# Patient Record
Sex: Female | Born: 1945 | Race: White | Hispanic: No | Smoking: Current every day smoker
Health system: Southern US, Community
[De-identification: ages and names within clinical notes are randomized; demographics above are authoritative.]

## PROBLEM LIST (undated history)

## (undated) DIAGNOSIS — J449 Chronic obstructive pulmonary disease, unspecified: Secondary | ICD-10-CM

## (undated) DIAGNOSIS — R5383 Other fatigue: Secondary | ICD-10-CM

## (undated) DIAGNOSIS — T7840XA Allergy, unspecified, initial encounter: Secondary | ICD-10-CM

## (undated) DIAGNOSIS — L509 Urticaria, unspecified: Secondary | ICD-10-CM

## (undated) DIAGNOSIS — E785 Hyperlipidemia, unspecified: Secondary | ICD-10-CM

## (undated) DIAGNOSIS — Z9889 Other specified postprocedural states: Secondary | ICD-10-CM

## (undated) DIAGNOSIS — R5381 Other malaise: Secondary | ICD-10-CM

## (undated) DIAGNOSIS — N3281 Overactive bladder: Secondary | ICD-10-CM

## (undated) DIAGNOSIS — J019 Acute sinusitis, unspecified: Secondary | ICD-10-CM

## (undated) DIAGNOSIS — C50919 Malignant neoplasm of unspecified site of unspecified female breast: Secondary | ICD-10-CM

## (undated) DIAGNOSIS — I10 Essential (primary) hypertension: Secondary | ICD-10-CM

## (undated) DIAGNOSIS — M810 Age-related osteoporosis without current pathological fracture: Secondary | ICD-10-CM

## (undated) DIAGNOSIS — H918X9 Other specified hearing loss, unspecified ear: Secondary | ICD-10-CM

## (undated) DIAGNOSIS — M545 Low back pain: Secondary | ICD-10-CM

## (undated) DIAGNOSIS — F329 Major depressive disorder, single episode, unspecified: Secondary | ICD-10-CM

## (undated) DIAGNOSIS — R112 Nausea with vomiting, unspecified: Secondary | ICD-10-CM

## (undated) HISTORY — DX: Urticaria, unspecified: L50.9

## (undated) HISTORY — DX: Other specified hearing loss, unspecified ear: H91.8X9

## (undated) HISTORY — DX: Low back pain: M54.5

## (undated) HISTORY — DX: Hyperlipidemia, unspecified: E78.5

## (undated) HISTORY — PX: OTHER SURGICAL HISTORY: SHX169

## (undated) HISTORY — DX: Essential (primary) hypertension: I10

## (undated) HISTORY — DX: Other malaise: R53.81

## (undated) HISTORY — DX: Chronic obstructive pulmonary disease, unspecified: J44.9

## (undated) HISTORY — DX: Malignant neoplasm of unspecified site of unspecified female breast: C50.919

## (undated) HISTORY — DX: Age-related osteoporosis without current pathological fracture: M81.0

## (undated) HISTORY — PX: OOPHORECTOMY: SHX86

## (undated) HISTORY — DX: Major depressive disorder, single episode, unspecified: F32.9

## (undated) HISTORY — DX: Overactive bladder: N32.81

## (undated) HISTORY — PX: ABDOMINAL HYSTERECTOMY: SHX81

## (undated) HISTORY — DX: Allergy, unspecified, initial encounter: T78.40XA

## (undated) HISTORY — DX: Acute sinusitis, unspecified: J01.90

## (undated) HISTORY — DX: Other fatigue: R53.83

---

## 1984-01-28 HISTORY — PX: BREAST BIOPSY: SHX20

## 1986-01-27 HISTORY — PX: APPENDECTOMY: SHX54

## 1998-01-27 HISTORY — PX: COLON SURGERY: SHX602

## 1998-01-30 ENCOUNTER — Emergency Department (HOSPITAL_COMMUNITY): Admission: EM | Admit: 1998-01-30 | Discharge: 1998-01-30 | Payer: Self-pay | Admitting: Emergency Medicine

## 1998-01-30 ENCOUNTER — Encounter: Payer: Self-pay | Admitting: Emergency Medicine

## 1998-02-03 ENCOUNTER — Encounter: Payer: Self-pay | Admitting: Emergency Medicine

## 1998-02-03 ENCOUNTER — Inpatient Hospital Stay (HOSPITAL_COMMUNITY): Admission: EM | Admit: 1998-02-03 | Discharge: 1998-02-18 | Payer: Self-pay | Admitting: Emergency Medicine

## 1998-02-04 ENCOUNTER — Encounter: Payer: Self-pay | Admitting: General Surgery

## 1998-02-05 ENCOUNTER — Encounter: Payer: Self-pay | Admitting: General Surgery

## 1998-02-06 ENCOUNTER — Encounter: Payer: Self-pay | Admitting: Family Medicine

## 1998-10-24 ENCOUNTER — Ambulatory Visit (HOSPITAL_COMMUNITY): Admission: RE | Admit: 1998-10-24 | Discharge: 1998-10-24 | Payer: Self-pay | Admitting: Family Medicine

## 1998-10-24 ENCOUNTER — Encounter: Payer: Self-pay | Admitting: Family Medicine

## 1999-09-26 ENCOUNTER — Encounter: Admission: RE | Admit: 1999-09-26 | Discharge: 1999-09-26 | Payer: Self-pay

## 2001-04-13 ENCOUNTER — Encounter: Admission: RE | Admit: 2001-04-13 | Discharge: 2001-04-13 | Payer: Self-pay

## 2006-03-30 ENCOUNTER — Ambulatory Visit: Payer: Self-pay | Admitting: Family Medicine

## 2006-09-28 LAB — HM COLONOSCOPY: HM Colonoscopy: NORMAL

## 2006-12-23 ENCOUNTER — Ambulatory Visit: Payer: Self-pay | Admitting: Internal Medicine

## 2006-12-23 ENCOUNTER — Encounter: Admission: RE | Admit: 2006-12-23 | Discharge: 2006-12-23 | Payer: Self-pay | Admitting: Internal Medicine

## 2006-12-23 DIAGNOSIS — F3289 Other specified depressive episodes: Secondary | ICD-10-CM

## 2006-12-23 DIAGNOSIS — M545 Low back pain, unspecified: Secondary | ICD-10-CM | POA: Insufficient documentation

## 2006-12-23 DIAGNOSIS — F329 Major depressive disorder, single episode, unspecified: Secondary | ICD-10-CM

## 2006-12-23 DIAGNOSIS — F32A Depression, unspecified: Secondary | ICD-10-CM | POA: Insufficient documentation

## 2006-12-23 HISTORY — DX: Low back pain, unspecified: M54.50

## 2006-12-23 HISTORY — DX: Other specified depressive episodes: F32.89

## 2006-12-23 HISTORY — DX: Major depressive disorder, single episode, unspecified: F32.9

## 2006-12-23 LAB — CONVERTED CEMR LAB
ALT: 15 units/L (ref 0–35)
AST: 19 units/L (ref 0–37)
Albumin: 4 g/dL (ref 3.5–5.2)
Alkaline Phosphatase: 68 units/L (ref 39–117)
BUN: 11 mg/dL (ref 6–23)
Basophils Absolute: 0 10*3/uL (ref 0.0–0.1)
Basophils Relative: 0 % (ref 0.0–1.0)
Bilirubin Urine: NEGATIVE
Bilirubin, Direct: 0.1 mg/dL (ref 0.0–0.3)
CO2: 29 meq/L (ref 19–32)
Calcium: 9.5 mg/dL (ref 8.4–10.5)
Chloride: 102 meq/L (ref 96–112)
Cholesterol: 180 mg/dL (ref 0–200)
Creatinine, Ser: 0.6 mg/dL (ref 0.4–1.2)
Eosinophils Absolute: 0.3 10*3/uL (ref 0.0–0.6)
Eosinophils Relative: 4.2 % (ref 0.0–5.0)
GFR calc Af Amer: 130 mL/min
GFR calc non Af Amer: 108 mL/min
Glucose, Bld: 106 mg/dL — ABNORMAL HIGH (ref 70–99)
HCT: 41.1 % (ref 36.0–46.0)
HDL: 54.6 mg/dL (ref >39.0)
Hemoglobin, Urine: NEGATIVE
Hemoglobin: 13.9 g/dL (ref 12.0–15.0)
Ketones, ur: NEGATIVE mg/dL
LDL Cholesterol: 110 mg/dL — ABNORMAL HIGH (ref 0–99)
Leukocytes, UA: NEGATIVE
Lymphocytes Relative: 16 % (ref 12.0–46.0)
MCHC: 33.9 g/dL (ref 30.0–36.0)
MCV: 91.8 fL (ref 78.0–100.0)
Monocytes Absolute: 0.5 10*3/uL (ref 0.2–0.7)
Monocytes Relative: 6.2 % (ref 3.0–11.0)
Neutro Abs: 5.5 10*3/uL (ref 1.4–7.7)
Neutrophils Relative %: 73.6 % (ref 43.0–77.0)
Nitrite: NEGATIVE
Platelets: 363 10*3/uL (ref 150–400)
Potassium: 4.5 meq/L (ref 3.5–5.1)
RBC: 4.47 M/uL (ref 3.87–5.11)
RDW: 12.4 % (ref 11.5–14.6)
Sodium: 138 meq/L (ref 135–145)
Specific Gravity, Urine: 1.01 (ref 1.000–1.03)
TSH: 1.36 microintl units/mL (ref 0.35–5.50)
Total Bilirubin: 0.5 mg/dL (ref 0.3–1.2)
Total CHOL/HDL Ratio: 3.3
Total Protein, Urine: NEGATIVE mg/dL
Total Protein: 7 g/dL (ref 6.0–8.3)
Triglycerides: 78 mg/dL (ref 0–149)
Urine Glucose: NEGATIVE mg/dL
Urobilinogen, UA: 0.2 (ref 0.0–1.0)
VLDL: 16 mg/dL (ref 0–40)
WBC: 7.5 10*3/uL (ref 4.5–10.5)
pH: 6 (ref 5.0–8.0)

## 2006-12-23 LAB — HM MAMMOGRAPHY: HM Mammogram: NORMAL

## 2007-01-04 ENCOUNTER — Encounter: Payer: Self-pay | Admitting: Internal Medicine

## 2007-07-09 ENCOUNTER — Ambulatory Visit: Payer: Self-pay | Admitting: Internal Medicine

## 2007-07-09 DIAGNOSIS — J019 Acute sinusitis, unspecified: Secondary | ICD-10-CM

## 2007-07-09 HISTORY — DX: Acute sinusitis, unspecified: J01.90

## 2008-04-30 ENCOUNTER — Emergency Department (HOSPITAL_COMMUNITY): Admission: EM | Admit: 2008-04-30 | Discharge: 2008-04-30 | Payer: Self-pay | Admitting: Family Medicine

## 2008-11-06 ENCOUNTER — Encounter: Payer: Self-pay | Admitting: Internal Medicine

## 2010-01-08 ENCOUNTER — Encounter: Payer: Self-pay | Admitting: Internal Medicine

## 2010-01-08 ENCOUNTER — Ambulatory Visit: Payer: Self-pay | Admitting: Internal Medicine

## 2010-01-08 DIAGNOSIS — H918X9 Other specified hearing loss, unspecified ear: Secondary | ICD-10-CM | POA: Insufficient documentation

## 2010-01-08 DIAGNOSIS — J449 Chronic obstructive pulmonary disease, unspecified: Secondary | ICD-10-CM | POA: Insufficient documentation

## 2010-01-08 DIAGNOSIS — M810 Age-related osteoporosis without current pathological fracture: Secondary | ICD-10-CM

## 2010-01-08 DIAGNOSIS — E785 Hyperlipidemia, unspecified: Secondary | ICD-10-CM

## 2010-01-08 DIAGNOSIS — R5383 Other fatigue: Secondary | ICD-10-CM | POA: Insufficient documentation

## 2010-01-08 DIAGNOSIS — J4489 Other specified chronic obstructive pulmonary disease: Secondary | ICD-10-CM

## 2010-01-08 DIAGNOSIS — R5381 Other malaise: Secondary | ICD-10-CM

## 2010-01-08 HISTORY — DX: Other malaise: R53.81

## 2010-01-08 HISTORY — DX: Age-related osteoporosis without current pathological fracture: M81.0

## 2010-01-08 HISTORY — DX: Chronic obstructive pulmonary disease, unspecified: J44.9

## 2010-01-08 HISTORY — DX: Other specified chronic obstructive pulmonary disease: J44.89

## 2010-01-08 HISTORY — DX: Other specified hearing loss, unspecified ear: H91.8X9

## 2010-01-08 HISTORY — DX: Hyperlipidemia, unspecified: E78.5

## 2010-01-09 ENCOUNTER — Encounter: Payer: Self-pay | Admitting: Internal Medicine

## 2010-01-09 ENCOUNTER — Telehealth: Payer: Self-pay | Admitting: Internal Medicine

## 2010-01-09 LAB — CONVERTED CEMR LAB
ALT: 24 units/L (ref 0–35)
AST: 25 units/L (ref 0–37)
Albumin: 4 g/dL (ref 3.5–5.2)
Alkaline Phosphatase: 73 units/L (ref 39–117)
BUN: 12 mg/dL (ref 6–23)
Basophils Absolute: 0 10*3/uL (ref 0.0–0.1)
Basophils Relative: 0.4 % (ref 0.0–3.0)
Bilirubin, Direct: 0.1 mg/dL (ref 0.0–0.3)
CO2: 24 meq/L (ref 19–32)
Calcium, Total (PTH): 9.2 mg/dL (ref 8.4–10.5)
Calcium: 9.5 mg/dL (ref 8.4–10.5)
Chloride: 109 meq/L (ref 96–112)
Cholesterol: 193 mg/dL (ref 0–200)
Creatinine, Ser: 0.6 mg/dL (ref 0.4–1.2)
Eosinophils Absolute: 0.2 10*3/uL (ref 0.0–0.7)
Eosinophils Relative: 2.6 % (ref 0.0–5.0)
GFR calc non Af Amer: 98.68 mL/min (ref >60.00)
Glucose, Bld: 106 mg/dL — ABNORMAL HIGH (ref 70–99)
HCT: 39 % (ref 36.0–46.0)
HDL: 45.9 mg/dL (ref >39.00)
Hemoglobin: 13.1 g/dL (ref 12.0–15.0)
LDL Cholesterol: 123 mg/dL — ABNORMAL HIGH (ref 0–99)
Lymphocytes Relative: 27.2 % (ref 12.0–46.0)
Lymphs Abs: 1.9 10*3/uL (ref 0.7–4.0)
MCHC: 33.6 g/dL (ref 30.0–36.0)
MCV: 91.2 fL (ref 78.0–100.0)
Monocytes Absolute: 0.6 10*3/uL (ref 0.1–1.0)
Monocytes Relative: 8.6 % (ref 3.0–12.0)
Neutro Abs: 4.3 10*3/uL (ref 1.4–7.7)
Neutrophils Relative %: 61.2 % (ref 43.0–77.0)
PTH: 74.8 pg/mL — ABNORMAL HIGH (ref 14.0–72.0)
Platelets: 408 10*3/uL — ABNORMAL HIGH (ref 150.0–400.0)
Potassium: 4.1 meq/L (ref 3.5–5.1)
RBC: 4.28 M/uL (ref 3.87–5.11)
RDW: 13.4 % (ref 11.5–14.6)
Sodium: 142 meq/L (ref 135–145)
TSH: 1.22 microintl units/mL (ref 0.35–5.50)
Total Bilirubin: 0.3 mg/dL (ref 0.3–1.2)
Total CHOL/HDL Ratio: 4
Total Protein: 7.1 g/dL (ref 6.0–8.3)
Triglycerides: 120 mg/dL (ref 0.0–149.0)
VLDL: 24 mg/dL (ref 0.0–40.0)
Vit D, 25-Hydroxy: 28 ng/mL — ABNORMAL LOW (ref 30–89)
WBC: 7 10*3/uL (ref 4.5–10.5)

## 2010-02-26 NOTE — Therapy (Signed)
Summary: [facility]  [facility]   Imported By: Sherian Rein 11/15/2008 09:18:10  _____________________________________________________________________  External Attachment:    Type:   Image     Comment:   External Document

## 2010-02-26 NOTE — Assessment & Plan Note (Signed)
Summary: SINUS PROBLEM-$50-STC   Vital Signs:  Patient Profile:   65 Years Old Female Weight:      135 pounds Temp:     97.8 degrees F oral Pulse rate:   89 / minute BP sitting:   126 / 63  (right arm) Cuff size:   regular  Pt. in pain?   no  Vitals Entered By: Maris Berger (July 09, 2007 2:01 PM)                  Chief Complaint:  sore throat.  History of Present Illness: here with 2 wks ongoing right facial pain, fever, greenish congestion and right earache since she got back from [employer]    Updated Prior Medication List: MOBIC 7.5 MG  TABS (MELOXICAM) as needed WELLBUTRIN SR 150 MG  TB12 (BUPROPION HCL) 1 by mouth bid ALPRAZOLAM 0.25 MG  TABS (ALPRAZOLAM) 1 by mouth at bedtime prn ALENDRONATE SODIUM 70 MG  TABS (ALENDRONATE SODIUM) 1 by mouth qwk LEVAQUIN 500 MG  TABS (LEVOFLOXACIN) 1po once daily TESSALON 200 MG  CAPS (BENZONATATE) 1 by mouth q 6 hrs as needed cough  Current Allergies (reviewed today): ! CODEINE  Past Medical History:    Reviewed history from 12/23/2006 and no changes required:       Low back pain       lumbar djd       c-spine djd       SBO 2000 secondary to scar tissue  Past Surgical History:    Reviewed history from 12/23/2006 and no changes required:       breast biopsy - 1986       Oophorectomy secondary to cyst - bilat 1988       Hysterectomy   Social History:    Reviewed history from 12/23/2006 and no changes required:       Current Smoker       Alcohol use-yes    Review of Systems       all otherwise negative    Physical Exam  General:     mild ill, well developed  Head:     Normocephalic and atraumatic without obvious abnormalities. No apparent alopecia or balding. Eyes:     No corneal or conjunctival inflammation noted. EOMI. Perrla Ears:     right tm severe red, left mild red Nose:     nasal dischargemucosal pallor and mucosal erythema.   Mouth:     pharyngeal erythema and fair dentition.   Neck:      supple and cervical lymphadenopathy.   Lungs:     Normal respiratory effort, chest expands symmetrically. Lungs are clear to auscultation, no crackles or wheezes. Heart:     Normal rate and regular rhythm. S1 and S2 normal without gallop, murmur, click, rub or other extra sounds. Extremities:     no edema, no ulcers     Impression & Recommendations:  Problem # 1:  SINUSITIS- ACUTE-NOS (ICD-461.9)  Her updated medication list for this problem includes:    Levaquin 500 Mg Tabs (Levofloxacin) .Marland Kitchen... 1po once daily    Tessalon 200 Mg Caps (Benzonatate) .Marland Kitchen... 1 by mouth q 6 hrs as needed cough treat as above, f/u any worsening signs or symptoms   Complete Medication List: 1)  Mobic 7.5 Mg Tabs (Meloxicam) .... As needed 2)  Wellbutrin Sr 150 Mg Tb12 (Bupropion hcl) .Marland Kitchen.. 1 by mouth bid 3)  Alprazolam 0.25 Mg Tabs (Alprazolam) .Marland Kitchen.. 1 by mouth at bedtime prn 4)  Alendronate Sodium 70 Mg Tabs (Alendronate sodium) .Marland Kitchen.. 1 by mouth qwk 5)  Levaquin 500 Mg Tabs (Levofloxacin) .Marland Kitchen.. 1po once daily 6)  Tessalon 200 Mg Caps (Benzonatate) .Marland Kitchen.. 1 by mouth q 6 hrs as needed cough   Patient Instructions: 1)  take all new medications as prescribed  2)  continue all medications that you may have been taking previously 3)  Please schedule a follow-up appointment in 6 months with CPX labs   Prescriptions: TESSALON 200 MG  CAPS (BENZONATATE) 1 by mouth q 6 hrs as needed cough  #60 x 1   Entered and Authorized by:   Corwin Levins MD   Signed by:   Corwin Levins MD on 07/09/2007   Method used:   Print then Give to Patient   RxID:   [id number] LEVAQUIN 500 MG  TABS (LEVOFLOXACIN) 1po once daily  #10 x 0   Entered and Authorized by:   Corwin Levins MD   Signed by:   Corwin Levins MD on 07/09/2007   Method used:   Print then Give to Patient   RxID:   [id number] ALPRAZOLAM 0.25 MG  TABS (ALPRAZOLAM) 1 by mouth at bedtime prn  #30 x 5   Entered and Authorized by:   Corwin Levins MD   Signed  by:   Corwin Levins MD on 07/09/2007   Method used:   Print then Give to Patient   RxID:   [id number]  ]

## 2010-02-26 NOTE — Consult Note (Signed)
Summary: [facility]Audiological re-evaluation  [facility]Audiological re-evaluation   Imported By: Esmeralda Links D'jimraou 01/15/2007 11:56:49  _____________________________________________________________________  External Attachment:    Type:   Image     Comment:   External Document

## 2010-02-28 NOTE — Assessment & Plan Note (Signed)
Summary: SINUS PROBLEM---STC   Vital Signs:  Patient profile:   65 year old female Height:      65.5 inches Weight:      156.38 pounds BMI:     25.72 O2 Sat:      95 % on Room air Temp:     97.7 degrees F oral Pulse rate:   81 / minute BP sitting:   138 / 82  (left arm) Cuff size:   regular  Vitals Entered By: Zella Ball Ewing CMA Duncan Dull) (January 08, 2010 3:48 PM)  O2 Flow:  Room air  Preventive Care Screening  Last Flu Shot:    Date:  01/08/2010    Results:  Fluvax 3+  Bone Density:    Date:  12/23/2006    Results:  abnormal std dev  CC: Headache, ear pain, refills, flu shot/RE   CC:  Headache, ear pain, refills, and flu shot/RE.  History of Present Illness: here after a hiatus of 2 yrs as she did not have health insurance;  lives in [place] but lose to [place]; was seen per [facility] pulmonary with PNA 2 yrs ago, PFT's with mild COPD and did well thereafter;  here with c/o 2-3 days facial pain, pressure, fever and grenish d/c with slight ST and nonprod cough but Pt denies CP, worsening sob, doe, wheezing, orthopnea, pnd, worsening LE edema, palps, dizziness or syncope .  Pt denies new neuro symptoms such as headache, facial or extremity weakness  Pt denies polydipsia, polyuria.   Overall good compliance with meds, trying to follow low chol diet, wt stable, little excercise however  No recent wt loss, night sweats, loss of appetite or other constitutional symptoms Denies suicidal ideation, or panic, but has mild to mod seasonal depressive symptoms with low mood, tearfulness, anhedonia , this being the third xmas after her husband died (did ok the last 2 yrs).  Had quit smoking for 5 mo, then just restarted 2 days ago due to the symptoms and anxiety.  Asks for repeat wellbutrin as this helped before, and she read where it can help with smoking cessation as well.   Also states she stopped her alendronate as she read about atypical fx risk, though she has taken < 5 yrs.    Preventive  Screening-Counseling & Management      Drug Use:  no.    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 As Needed 2)  Wellbutrin Sr 150 Mg  Tb12 (Bupropion Hcl) .Marland Kitchen.. 1 By Mouth Bid 3)  Alprazolam 0.25 Mg  Tabs (Alprazolam) .Marland Kitchen.. 1 By Mouth At Bedtime Prn 4)  Alendronate Sodium 70 Mg  Tabs (Alendronate Sodium) .Marland Kitchen.. 1 By Mouth Qwk 5)  Levaquin 500 Mg  Tabs (Levofloxacin) .Marland Kitchen.. 1po Once Daily 6)  Tessalon 200 Mg  Caps (Benzonatate) .Marland Kitchen.. 1 By Mouth Q 6 Hrs As Needed Cough  Current Medications (verified): 1)  Mobic 7.5 Mg  Tabs (Meloxicam) .Marland Kitchen.. 1 By Mouth Once Daily As Needed 2)  Wellbutrin Sr 150 Mg  Tb12 (  Bupropion Hcl) .Marland Kitchen.. 1 By Mouth Two Times A Day 3)  Alprazolam 0.25 Mg  Tabs (Alprazolam) .Marland Kitchen.. 1 By Mouth At Bedtime As Needed 4)  Doxycycline Hyclate 100 Mg Caps (Doxycycline Hyclate) .Marland Kitchen.. 1 By Mouth Two Times A Day  Allergies (verified): 1)  ! Codeine  Past History:  Past Surgical History: Last updated: 12/23/2006 breast biopsy - 1986 Oophorectomy secondary to cyst - bilat 1988 Hysterectomy  Family History: Last updated: 12/23/2006 mother and brother with colon cancer grandmother with dm uncle with dm  Social History: Last updated: 01/08/2010 Current Smoker Alcohol use-yes Drug use-no Retired Widow/Widower  Risk Factors: Smoking Status: current (12/23/2006)  Past Medical History: Low back pain lumbar djd c-spine djd SBO 2000 secondary to scar tissue COPD Osteoporosis  Social History: Reviewed history  from 12/23/2006 and no changes required. Current Smoker Alcohol use-yes Drug use-no Retired Conservation officer, [employer] Drug Use:  no  Review of Systems       all otherwise negative per pt -  except for decrsaed hearing right ear with wax impaction, and also fatigue without OSA symptoms.  Physical Exam  General:  alert and overweight-appearing., mild ill  Head:  normocephalic and atraumatic.   Eyes:  vision grossly intact, pupils equal, and pupils round.   Ears:  bilat tm's mild red, sinus tender bialt max area;  right TM with wax impaction and decreased hearing resolved with irrigation Nose:  nasal dischargemucosal pallor and mucosal edema.   Mouth:  pharyngeal erythema.   Neck:  supple and no masses.   Lungs:  normal respiratory effort, R decreased breath sounds, and L decreased breath sounds.  , no wheezing Heart:  normal rate and regular rhythm.   Abdomen:  soft, non-tender, and normal bowel sounds.   Msk:  no joint tenderness and no joint warmth.   Extremities:  no edema, no erythema  Neurologic:  strength normal in all extremities and gait normal.   Psych:  dysphoric affect, tearful, and moderately anxious.     Impression & Recommendations:  Problem # 1:  OSTEOPOROSIS (ICD-733.00)  The following medications were removed from the medication list:    Alendronate Sodium 70 Mg Tabs (Alendronate sodium) .Marland Kitchen... 1 by mouth qwk due for f/u dxa, vit d, PTH level , last dxa reviewed with pt  Orders: T-Vitamin D (25-Hydroxy) [phone]) T-Bone Densitometry [phone]) T-Parathyroid Hormone, Intact [phone])  Problem # 2:  OTHER SPECIFIED FORMS OF HEARING LOSS (ICD-389.8) improved on the left after irrigation for wax impaction  Problem # 3:  DEPRESSIVE DISORDER (ICD-311)  Her updated medication list for this problem includes:    Wellbutrin Sr 150 Mg Tb12 (Bupropion hcl) .Marland Kitchen... 1 by mouth two times a day    Alprazolam 0.25 Mg Tabs (Alprazolam) .Marland Kitchen... 1 by mouth at bedtime as needed also  for quitting smoking;  treat as above, f/u any worsening signs or symptoms   Problem # 4:  FATIGUE (ICD-780.79) exam benign, to check labs below; follow with expectant management  Orders: TLB-BMP (Basic Metabolic Panel-BMET) (80048-METABOL) TLB-CBC Platelet - w/Differential (85025-CBCD) TLB-Hepatic/Liver Function Pnl (80076-HEPATIC) TLB-TSH (Thyroid Stimulating Hormone) (84443-TSH)  Problem # 5:  SINUSITIS- ACUTE-NOS (ICD-461.9)  The following medications were removed from the medication list:    Tessalon 200 Mg Caps (Benzonatate) .Marland Kitchen... 1 by mouth q 6 hrs as needed cough Her updated medication list for this problem includes:    Doxycycline Hyclate 100 Mg Caps (Doxycycline hyclate) .Marland Kitchen... 1 by mouth two times a day treat as above, f/u any worsening signs  or symptoms   Complete Medication List: 1)  Mobic 7.5 Mg Tabs (Meloxicam) .Marland Kitchen.. 1 by mouth once daily as needed 2)  Wellbutrin Sr 150 Mg Tb12 (Bupropion hcl) .Marland Kitchen.. 1 by mouth two times a day 3)  Alprazolam 0.25 Mg Tabs (Alprazolam) .Marland Kitchen.. 1 by mouth at bedtime as needed 4)  Doxycycline Hyclate 100 Mg Caps (Doxycycline hyclate) .Marland Kitchen.. 1 by mouth two times a day  Other Orders: Flu Vaccine 79yrs + MEDICARE PATIENTS (Z6109) Administration Flu vaccine - MCR (G0008) TLB-Lipid Panel (80061-LIPID) Pneumococcal Vaccine (60454) Admin 1st Vaccine (09811)  Patient Instructions: 1)  you had the flu shot today, and the pneumonia shot today 2)  you had the ears irrigated today 3)  The  nurse will call about the copay for the prolia (stacy) 4)  Please take all new medications as prescribed - the antibiotic, wellbutrin, and xanax as needed  5)  Please go to the Lab in the basement for your blood and/or urine tests today 6)  Please call the number on the [facility] Card for results of your testing  7)  Please schedule your bone density before leaving today 8)  Please schedule a follow-up appointment as you suggested Prescriptions: MOBIC 7.5 MG  TABS  (MELOXICAM) 1 by mouth once daily as needed  #90 x 3   Entered and Authorized by:   Corwin Levins MD   Signed by:   Corwin Levins MD on 01/08/2010   Method used:   Print then Give to Patient   RxID:   [id number] DOXYCYCLINE HYCLATE 100 MG CAPS (DOXYCYCLINE HYCLATE) 1 by mouth two times a day  #20 x 0   Entered and Authorized by:   Corwin Levins MD   Signed by:   Corwin Levins MD on 01/08/2010   Method used:   Print then Give to Patient   RxID:   [id number] ALPRAZOLAM 0.25 MG  TABS (ALPRAZOLAM) 1 by mouth at bedtime as needed  #30 x 5   Entered and Authorized by:   Corwin Levins MD   Signed by:   Corwin Levins MD on 01/08/2010   Method used:   Print then Give to Patient   RxID:   [id number] WELLBUTRIN SR 150 MG  TB12 (BUPROPION HCL) 1 by mouth two times a day  #60 x 11   Entered and Authorized by:   Corwin Levins MD   Signed by:   Corwin Levins MD on 01/08/2010   Method used:   Print then Give to Patient   RxID:   [id number]    Orders Added: 1)  Flu Vaccine 80yrs + MEDICARE PATIENTS [Q2039] 2)  Administration Flu vaccine - MCR [G0008] 3)  TLB-BMP (Basic Metabolic Panel-BMET) [80048-METABOL] 4)  TLB-CBC Platelet - w/Differential [85025-CBCD] 5)  TLB-Hepatic/Liver Function Pnl [80076-HEPATIC] 6)  TLB-TSH (Thyroid Stimulating Hormone) [84443-TSH] 7)  T-Vitamin D (25-Hydroxy) [95188-41660] 8)  T-Bone Densitometry [77080] 9)  TLB-Lipid Panel [80061-LIPID] 10)  T-Parathyroid Hormone, Intact [63016-01093] 11)  Pneumococcal Vaccine [90732] 12)  Admin 1st Vaccine [90471] 13)  Est. Patient Level IV [23557]   Immunizations Administered:  Influenza Vaccine # 1:    Vaccine Type: Fluvax 3+    Site: left deltoid    Mfr: Sanofi Pasteur    Dose: 0.5 ml    Route: IM    Given by: Zella Ball Ewing CMA (AAMA)    Exp. Date: 08/26/2010    Lot #: [id number]    VIS given: 08/21/09 version given  January 08, 2010.  Pneumonia Vaccine:    Vaccine Type: Pneumovax    Site: right  deltoid    Mfr: Merck    Dose: 0.5 ml    Route: IM    Given by: Zella Ball Ewing CMA (AAMA)    Exp. Date: 05/24/2011    Lot #: [id number]    VIS given: 01/01/09 version given January 08, 2010.  Flu Vaccine Consent Questions:    Do you have a history of severe allergic reactions to this vaccine? no    Any prior history of allergic reactions to egg and/or gelatin? no    Do you have a sensitivity to the preservative Thimersol? no    Do you have a past history of Guillan-Barre Syndrome? no    Do you currently have an acute febrile illness? no    Have you ever had a severe reaction to latex? no    Vaccine information given and explained to patient? yes    Are you currently pregnant? no   Immunizations Administered:  Influenza Vaccine # 1:    Vaccine Type: Fluvax 3+    Site: left deltoid    Mfr: Sanofi Pasteur    Dose: 0.5 ml    Route: IM    Given by: Zella Ball Ewing CMA (AAMA)    Exp. Date: 08/26/2010    Lot #: [id number]    VIS given: 08/21/09 version given January 08, 2010.  Pneumonia Vaccine:    Vaccine Type: Pneumovax    Site: right deltoid    Mfr: Merck    Dose: 0.5 ml    Route: IM    Given by: Zella Ball Ewing CMA (AAMA)    Exp. Date: 05/24/2011    Lot #: [id number]    VIS given: 01/01/09 version given January 08, 2010.

## 2010-02-28 NOTE — Progress Notes (Signed)
Summary: [employer]  Phone Note Outgoing Call   Call placed by: Lanier Prude, [facility]),  January 09, 2010 9:58 AM Summary of Call: faxed ins. verification request to [employer]........Marland Kitchenwill wait on benefits summary.  Follow-up for Phone Call        rec benefits summary from [employer]. [employer] will cost $0 OOP. left mess for pt to call back to inform of above Follow-up by: Lanier Prude, [facility]),  January 17, 2010 9:20 AM  Additional Follow-up for Phone Call Additional follow up Details #1::        pt returned my call and states she has changed her secondary ins recently. I informed her that the benefits may or may not be the same and that we need a copy of her new ins card to reverify benefits for [employer]. Pt understands and will bring new card sometime this week. Additional Follow-up by: Lanier Prude, [facility]),  February 04, 2010 3:11 PM    Additional Follow-up for Phone Call Additional follow up Details #2::    noted Follow-up by: Corwin Levins MD,  February 04, 2010 3:16 PM

## 2010-02-28 NOTE — Medication Information (Signed)
Summary: Ins Verification for [employer]  Ins Verification for [employer]   Imported By: Sherian Rein 01/14/2010 08:52:21  _____________________________________________________________________  External Attachment:    Type:   Image     Comment:   External Document

## 2010-03-05 ENCOUNTER — Encounter: Payer: Self-pay | Admitting: Internal Medicine

## 2010-03-06 ENCOUNTER — Other Ambulatory Visit: Payer: Self-pay

## 2010-03-06 ENCOUNTER — Other Ambulatory Visit: Payer: Self-pay | Admitting: Internal Medicine

## 2010-03-06 ENCOUNTER — Ambulatory Visit (INDEPENDENT_AMBULATORY_CARE_PROVIDER_SITE_OTHER)
Admission: RE | Admit: 2010-03-06 | Discharge: 2010-03-06 | Disposition: A | Discharge status: Home / Self Care | Payer: Medicare Other | Source: Ambulatory Visit | Attending: Internal Medicine | Admitting: Internal Medicine

## 2010-03-06 ENCOUNTER — Encounter: Payer: Self-pay | Admitting: Internal Medicine

## 2010-03-06 DIAGNOSIS — M810 Age-related osteoporosis without current pathological fracture: Secondary | ICD-10-CM

## 2010-03-06 LAB — HM DEXA SCAN

## 2010-03-14 NOTE — Miscellaneous (Signed)
Summary: Orders Update   Clinical Lists Changes  Orders: Added new Test order of T-Lumbar Vertebral Assessment (77082) - Signed 

## 2010-04-03 ENCOUNTER — Encounter: Payer: Self-pay | Admitting: Internal Medicine

## 2010-04-04 ENCOUNTER — Telehealth: Payer: Self-pay | Admitting: Internal Medicine

## 2010-04-09 NOTE — Medication Information (Signed)
Summary: RX Folder  RX Folder   Imported By: Kassie Mends 04/05/2010 09:33:40  _____________________________________________________________________  External Attachment:    Type:   Image     Comment:   External Document

## 2010-04-09 NOTE — Progress Notes (Signed)
Summary: [employer]  Phone Note Outgoing Call   Call placed by: Lanier Prude, [facility]),  April 04, 2010 2:00 PM Summary of Call: I rec a new updated benefit summary from [employer] stating pt's OOP is approx $121. See new/updated benefit summary dated 04-04-10. left mess for pt to call back to advise pt of above. Initial call taken by: Lanier Prude, [facility]),  April 04, 2010 2:06 PM  Follow-up for Phone Call        Informed Pt. and transferred to scheduler to make Appt. Informed Pearla Dubonnet to place Prolia order. Follow-up by: Burnard Leigh [facility]),  April 05, 2010 4:15 PM

## 2010-04-16 ENCOUNTER — Ambulatory Visit (INDEPENDENT_AMBULATORY_CARE_PROVIDER_SITE_OTHER): Payer: Medicare Other | Admitting: Internal Medicine

## 2010-04-16 DIAGNOSIS — M810 Age-related osteoporosis without current pathological fracture: Secondary | ICD-10-CM

## 2010-04-17 MED ORDER — DENOSUMAB 60 MG/ML ~~LOC~~ SOLN: 60.0000 mg | Freq: Once | SUBCUTANEOUS | Status: DC

## 2010-04-18 MED ORDER — DENOSUMAB 60 MG/ML ~~LOC~~ SOLN
60.0000 mg | Freq: Once | SUBCUTANEOUS | Status: AC
  Administered 2010-04-16: 60 mg via SUBCUTANEOUS

## 2010-04-30 ENCOUNTER — Encounter: Payer: Self-pay | Admitting: Internal Medicine

## 2010-04-30 ENCOUNTER — Ambulatory Visit (INDEPENDENT_AMBULATORY_CARE_PROVIDER_SITE_OTHER): Payer: Medicare Other | Admitting: Internal Medicine

## 2010-04-30 DIAGNOSIS — E785 Hyperlipidemia, unspecified: Secondary | ICD-10-CM

## 2010-04-30 DIAGNOSIS — E559 Vitamin D deficiency, unspecified: Secondary | ICD-10-CM

## 2010-04-30 DIAGNOSIS — F329 Major depressive disorder, single episode, unspecified: Secondary | ICD-10-CM

## 2010-04-30 DIAGNOSIS — J449 Chronic obstructive pulmonary disease, unspecified: Secondary | ICD-10-CM

## 2010-04-30 NOTE — Patient Instructions (Addendum)
Please start oscal plus D twice per day Please follow lower cholesterol diet Continue all other medications as before Please return in December 2012  (remember no fasting needed, but labs will need to be done then) Please call if you need further refills

## 2010-04-30 NOTE — Assessment & Plan Note (Signed)
stable overall by hx and exam, most recent lab reviewed with pt, and pt to continue medical treatment as before 

## 2010-04-30 NOTE — Progress Notes (Signed)
Subjective:    Patient ID: Michelle Hubbard, female    DOB: 21-Jan-1946, 65 y.o.   MRN: [id number]  HPI  Here to f/u; overall doing very well, no specific worsening complaints today;   Pt denies CP, worsening SOB, DOE, wheezing, orthopnea, PND, worsening LE edema, palpitations, dizziness or syncope.  Pt denies neurological change such as new Headache, facial or extremity weakness.  Pt denies polydipsia, polyuria, or low sugar symptoms. Pt states overall good compliance with treatment and medications, good tolerability, but not really trying to follow lower cholesterol diet.  Pt denies worsening depressive symptoms, suicidal ideation or panic. No fever, wt loss, night sweats, loss of appetite, or other constitutional symptoms.  Was able to quit smoking with the wellbutrin and anxiety levels stable though picked up several lbs in the past few months despite walking the dog 1 mile per day.    Past Medical History  Diagnosis Date  . DEPRESSIVE DISORDER 12/23/2006  . SINUSITIS- ACUTE-NOS 07/09/2007  . LOW BACK PAIN 12/23/2006  . HYPERLIPIDEMIA 01/08/2010  . 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 COPD 01/08/2010  . OSTEOPOROSIS 01/08/2010  . FATIGUE 01/08/2010  . Vitamin D deficiency 04/30/2010   Past Surgical History  Procedure Date  . Breast biopsy 1986  . Oophorectomy bilat 1988    secondary to cyst  . Abdominal hysterectomy     reports that she has been smoking.  She does not have any smokeless tobacco history on file. She reports that she drinks alcohol. She reports that she does not use illicit drugs. family history includes Colon cancer in her brother and mother and Diabetes in her maternal aunt and maternal grandmother. Allergies  Allergen Reactions  . Codeine     REACTION: nausea    Current Outpatient Prescriptions on File Prior to Visit  Medication Sig Dispense Refill  . ALPRAZolam (XANAX) 0.25 MG tablet Take 0.25 mg by mouth at bedtime as needed.        Marland Kitchen buPROPion  (WELLBUTRIN SR) 150 MG 12 hr tablet Take 150 mg by mouth 2 (two) times daily.        . meloxicam (MOBIC) 7.5 MG tablet Take 7.5 mg by mouth daily.        Marland Kitchen DISCONTD: doxycycline (VIBRAMYCIN) 100 MG capsule Take 100 mg by mouth 2 (two) times daily.         Review of Systems Review of Systems  Constitutional: Negative for diaphoresis and unexpected weight change.  HENT: Negative for drooling and tinnitus.   Eyes: Negative for photophobia and visual disturbance.  Respiratory: Negative for choking and stridor.   Gastrointestinal: Negative for vomiting and blood in stool.  Genitourinary: Negative for hematuria and decreased urine volume.  Musculoskeletal: Negative for gait problem.  Skin: Negative for color change and wound.  Neurological: Negative for tremors and numbness.  Psychiatric/Behavioral: Negative for decreased concentration. The patient is not hyperactive.       Objective:   Physical Exam Physical Exam  VS noted Constitutional: Pt appears well-developed and well-nourished.  HENT: Head: Normocephalic.  Right Ear: External ear normal.  Left Ear: External ear normal.  Eyes: Conjunctivae and EOM are normal. Pupils are equal, round, and reactive to light.  Neck: Normal range of motion. Neck supple.  Cardiovascular: Normal rate and regular rhythm.   Pulmonary/Chest: Effort normal and breath sounds normal.  Abd:  Soft, NT, non-distended, + BS Neurological: Pt is alert. No cranial nerve deficit.  Skin: Skin is warm. No erythema.  Psychiatric: Pt behavior is normal. Thought content normal.         Assessment & Plan:

## 2010-04-30 NOTE — Assessment & Plan Note (Signed)
D/w pt and her most recent dxa - to cont the prolia, but to start the oscal plus D bid

## 2010-04-30 NOTE — Assessment & Plan Note (Signed)
stable overall by hx and exam, most recent lab reviewed with pt, and pt to continue medical treatment as before  - to cont diet per pt for now, to consider statin in the future if ldl still > 100  Lab Results  Component Value Date   LDLCALC 123* 01/08/2010

## 2010-04-30 NOTE — Assessment & Plan Note (Signed)
stable overall by hx and exam, most recent lab reviewed with pt, and pt to continue medical treatment as before  SpO2 Readings from Last 3 Encounters:  04/30/10 96%  01/08/10 95%

## 2010-07-23 ENCOUNTER — Other Ambulatory Visit: Payer: Self-pay

## 2010-07-23 MED ORDER — ALPRAZOLAM 0.25 MG PO TABS: 0.2500 mg | ORAL_TABLET | Freq: Every evening | ORAL | Status: DC | PRN

## 2010-07-23 NOTE — Telephone Encounter (Signed)
Faxed hardcopy to pharmacy. 

## 2010-10-03 ENCOUNTER — Telehealth: Payer: Self-pay | Admitting: *Deleted

## 2010-10-03 NOTE — Telephone Encounter (Signed)
rec pt's eligibility check results from [employer] for her upcoming injection due on 10-17-10. Her OOP is $0 plus any applicable deductible. I called pt to advise of this. No answer. Left mess for patient to call back.

## 2010-10-11 NOTE — Telephone Encounter (Signed)
Pt informed and transferred to scheduler to make nurse visit for Thursday 10-17-10 of next week or later.

## 2010-10-17 ENCOUNTER — Ambulatory Visit (INDEPENDENT_AMBULATORY_CARE_PROVIDER_SITE_OTHER): Payer: Medicare Other

## 2010-10-17 DIAGNOSIS — M810 Age-related osteoporosis without current pathological fracture: Secondary | ICD-10-CM

## 2010-10-17 MED ORDER — DENOSUMAB 60 MG/ML ~~LOC~~ SOLN
60.0000 mg | Freq: Once | SUBCUTANEOUS | Status: AC
  Administered 2010-10-17: 60 mg via SUBCUTANEOUS

## 2010-12-31 ENCOUNTER — Ambulatory Visit: Payer: Medicare Other | Admitting: Internal Medicine

## 2011-06-03 ENCOUNTER — Ambulatory Visit (INDEPENDENT_AMBULATORY_CARE_PROVIDER_SITE_OTHER): Payer: Medicare Other

## 2011-06-03 DIAGNOSIS — M810 Age-related osteoporosis without current pathological fracture: Secondary | ICD-10-CM

## 2011-06-03 MED ORDER — DENOSUMAB 60 MG/ML ~~LOC~~ SOLN
60.0000 mg | Freq: Once | SUBCUTANEOUS | Status: AC
  Administered 2011-06-03: 60 mg via SUBCUTANEOUS

## 2011-08-15 ENCOUNTER — Other Ambulatory Visit (INDEPENDENT_AMBULATORY_CARE_PROVIDER_SITE_OTHER): Payer: Medicare Other

## 2011-08-15 ENCOUNTER — Encounter: Payer: Self-pay | Admitting: Internal Medicine

## 2011-08-15 ENCOUNTER — Ambulatory Visit (INDEPENDENT_AMBULATORY_CARE_PROVIDER_SITE_OTHER): Payer: Medicare Other | Admitting: Internal Medicine

## 2011-08-15 VITALS — BP 132/80 | HR 81 | Temp 97.8°F | Ht 66.0 in | Wt 153.5 lb

## 2011-08-15 DIAGNOSIS — F5104 Psychophysiologic insomnia: Secondary | ICD-10-CM | POA: Insufficient documentation

## 2011-08-15 DIAGNOSIS — R339 Retention of urine, unspecified: Secondary | ICD-10-CM

## 2011-08-15 DIAGNOSIS — G4700 Insomnia, unspecified: Secondary | ICD-10-CM

## 2011-08-15 DIAGNOSIS — M25569 Pain in unspecified knee: Secondary | ICD-10-CM

## 2011-08-15 DIAGNOSIS — M25561 Pain in right knee: Secondary | ICD-10-CM

## 2011-08-15 LAB — URINALYSIS, ROUTINE W REFLEX MICROSCOPIC
Bilirubin Urine: NEGATIVE
Hgb urine dipstick: NEGATIVE
Ketones, ur: NEGATIVE
Leukocytes, UA: NEGATIVE
Nitrite: NEGATIVE
Specific Gravity, Urine: 1.01 (ref 1.000–1.030)
Total Protein, Urine: NEGATIVE
Urine Glucose: NEGATIVE
Urobilinogen, UA: 0.2 (ref 0.0–1.0)
pH: 6.5 (ref 5.0–8.0)

## 2011-08-15 MED ORDER — TAMSULOSIN HCL 0.4 MG PO CAPS: 0.4000 mg | ORAL_CAPSULE | Freq: Every day | ORAL | Status: DC

## 2011-08-15 MED ORDER — TEMAZEPAM 15 MG PO CAPS: ORAL_CAPSULE | ORAL | Status: DC

## 2011-08-15 NOTE — Patient Instructions (Addendum)
Take all new medications as prescribed - the sleep med, and generic flomax for the bladder Please go to LAB in the Basement for the urine tests to be done today You will be contacted regarding the referral for: urology, and orthopedic

## 2011-08-15 NOTE — Assessment & Plan Note (Addendum)
With several recent giveaways - for ortho referral,  to f/u any worsening symptoms or concerns

## 2011-08-16 ENCOUNTER — Encounter: Payer: Self-pay | Admitting: Internal Medicine

## 2011-08-16 NOTE — Assessment & Plan Note (Signed)
S/p failing mult tx , for temazepam prn,  to f/u any worsening symptoms or concerns

## 2011-08-16 NOTE — Progress Notes (Signed)
Subjective:    Patient ID: Michelle Hubbard, female    DOB: [birth year], 66 y.o.   MRN: [id number]  HPI  Here with 3-4 mo gradually worsening difficulty urinating, just very difficult to start, without fever, abd pain, n/v, flank pain, dysuria, freq, urgency, or hematuria or LBP worsening.  Does have hx of renal stone.  No recent worsening LBP, or sudafed type med or other change in meds.  Also with ongoing chronic insomnia, has tried numerous meds in the past, but did have good results with a friends lorazepam.  Denies worsening depressive symptoms, suicidal ideation, or panic.  Also c/o right knee pain mild to mod, ongoing for several months, and recently with several giveaways and near falling.  No truama, swelling, fever or hx of gout.   Pt denies fever, wt loss, night sweats, loss of appetite, or other constitutional symptoms  Pt denies chest pain, increased sob or doe, wheezing, orthopnea, PND, increased LE swelling, palpitations, dizziness or syncope. Past Medical History  Diagnosis Date  . DEPRESSIVE DISORDER 12/23/2006  . SINUSITIS- ACUTE-NOS 07/09/2007  . LOW BACK PAIN 12/23/2006  . HYPERLIPIDEMIA 01/08/2010  . 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 COPD 01/08/2010  . OSTEOPOROSIS 01/08/2010  . FATIGUE 01/08/2010  . Vitamin d deficiency 04/30/2010   Past Surgical History  Procedure Date  . Breast biopsy 1986  . Oophorectomy bilat 1988    secondary to cyst  . Abdominal hysterectomy     reports that she has been smoking.  She does not have any smokeless tobacco history on file. She reports that she drinks alcohol. She reports that she does not use illicit drugs. family history includes Colon cancer in her brother and mother and Diabetes in her maternal aunt and maternal grandmother. Allergies  Allergen Reactions  . Codeine     REACTION: nausea   Current Outpatient Prescriptions on File Prior to Visit  Medication Sig Dispense Refill  . ALPRAZolam (XANAX) 0.25 MG  tablet Take 1 tablet (0.25 mg total) by mouth at bedtime as needed.  30 tablet  5  . buPROPion (WELLBUTRIN SR) 150 MG 12 hr tablet Take 150 mg by mouth 2 (two) times daily.        . meloxicam (MOBIC) 7.5 MG tablet Take 7.5 mg by mouth daily.        . mometasone (NASONEX) 50 MCG/ACT nasal spray Place 2 sprays into the nose daily.      Marland Kitchen omeprazole (PRILOSEC) 20 MG capsule Take 20 mg by mouth daily.      . temazepam (RESTORIL) 15 MG capsule 1-2 tabs by mouth at bedtime as needed for sleep  60 capsule  3   Review of Systems Review of Systems  Constitutional: Negative for diaphoresis and unexpected weight change.  HENT: Negative for tinnitus.   Eyes: Negative for photophobia and visual disturbance.  Respiratory: Negative for choking and stridor.   Gastrointestinal: Negative for vomiting and blood in stool.  Genitourinary: Negative for hematuria and decreased urine volume.  Musculoskeletal: Negative for gait problem.  Skin: Negative for color change and wound.  Neurological: Negative for tremors and numbness. .      Objective:   Physical Exam BP 132/80  Pulse 81  Temp 97.8 F (36.6 C) (Oral)  Ht 5\' 6"  (1.676 m)  Wt 153 lb 8 oz (69.627 kg)  BMI 24.78 kg/m2  SpO2 97% Physical Exam  VS noted Constitutional: Pt appears well-developed and well-nourished.  HENT: Head: Normocephalic.  Right Ear:  External ear normal.  Left Ear: External ear normal.  Eyes: Conjunctivae and EOM are normal. Pupils are equal, round, and reactive to light.  Neck: Normal range of motion. Neck supple.  Cardiovascular: Normal rate and regular rhythm.   Pulmonary/Chest: Effort normal and breath sounds normal.  Abd:  Soft, NT, non-distended, + BS - benign, no palpable bladder, no flank tender Neurological: Pt is alert. Not confused Skin: Skin is warm. No erythema.  Right knee with minimal crepitus, lochman;s neg, no effusion, NT Psychiatric: Pt behavior is normal. Thought content normal. 1+ nervous    Assessment  & Plan:

## 2011-08-16 NOTE — Assessment & Plan Note (Signed)
Unclear etiology, for urine study - r/o infection, cant r/o stone or even malignancy - to hold antibx for now, start flomax asd, refer urology,  to f/u any worsening symptoms or concerns

## 2011-08-17 LAB — URINE CULTURE
Colony Count: NO GROWTH
Organism ID, Bacteria: NO GROWTH

## 2011-08-18 ENCOUNTER — Encounter: Payer: Self-pay | Admitting: Internal Medicine

## 2011-08-26 ENCOUNTER — Ambulatory Visit (INDEPENDENT_AMBULATORY_CARE_PROVIDER_SITE_OTHER): Payer: Medicare Other | Admitting: Urology

## 2011-08-26 DIAGNOSIS — N952 Postmenopausal atrophic vaginitis: Secondary | ICD-10-CM

## 2011-08-26 DIAGNOSIS — R39198 Other difficulties with micturition: Secondary | ICD-10-CM

## 2011-08-26 DIAGNOSIS — R3915 Urgency of urination: Secondary | ICD-10-CM

## 2011-10-07 ENCOUNTER — Other Ambulatory Visit (INDEPENDENT_AMBULATORY_CARE_PROVIDER_SITE_OTHER): Payer: Medicare Other

## 2011-10-07 ENCOUNTER — Ambulatory Visit (INDEPENDENT_AMBULATORY_CARE_PROVIDER_SITE_OTHER): Payer: Medicare Other | Admitting: Internal Medicine

## 2011-10-07 ENCOUNTER — Encounter: Payer: Self-pay | Admitting: Internal Medicine

## 2011-10-07 VITALS — BP 128/88 | HR 77 | Temp 97.5°F | Ht 66.0 in | Wt 155.4 lb

## 2011-10-07 DIAGNOSIS — Z0000 Encounter for general adult medical examination without abnormal findings: Secondary | ICD-10-CM | POA: Insufficient documentation

## 2011-10-07 DIAGNOSIS — M810 Age-related osteoporosis without current pathological fracture: Secondary | ICD-10-CM

## 2011-10-07 DIAGNOSIS — R5381 Other malaise: Secondary | ICD-10-CM

## 2011-10-07 DIAGNOSIS — E559 Vitamin D deficiency, unspecified: Secondary | ICD-10-CM

## 2011-10-07 DIAGNOSIS — F17200 Nicotine dependence, unspecified, uncomplicated: Secondary | ICD-10-CM | POA: Insufficient documentation

## 2011-10-07 DIAGNOSIS — R5383 Other fatigue: Secondary | ICD-10-CM

## 2011-10-07 DIAGNOSIS — E785 Hyperlipidemia, unspecified: Secondary | ICD-10-CM

## 2011-10-07 DIAGNOSIS — J449 Chronic obstructive pulmonary disease, unspecified: Secondary | ICD-10-CM

## 2011-10-07 DIAGNOSIS — Z23 Encounter for immunization: Secondary | ICD-10-CM

## 2011-10-07 DIAGNOSIS — F5104 Psychophysiologic insomnia: Secondary | ICD-10-CM

## 2011-10-07 DIAGNOSIS — G4700 Insomnia, unspecified: Secondary | ICD-10-CM

## 2011-10-07 DIAGNOSIS — N3281 Overactive bladder: Secondary | ICD-10-CM

## 2011-10-07 DIAGNOSIS — Z0001 Encounter for general adult medical examination with abnormal findings: Secondary | ICD-10-CM | POA: Insufficient documentation

## 2011-10-07 DIAGNOSIS — J4489 Other specified chronic obstructive pulmonary disease: Secondary | ICD-10-CM

## 2011-10-07 DIAGNOSIS — H918X9 Other specified hearing loss, unspecified ear: Secondary | ICD-10-CM

## 2011-10-07 DIAGNOSIS — Z136 Encounter for screening for cardiovascular disorders: Secondary | ICD-10-CM

## 2011-10-07 HISTORY — DX: Overactive bladder: N32.81

## 2011-10-07 LAB — BASIC METABOLIC PANEL
BUN: 14 mg/dL (ref 6–23)
CO2: 26 mEq/L (ref 19–32)
Calcium: 9.2 mg/dL (ref 8.4–10.5)
Chloride: 102 mEq/L (ref 96–112)
Creatinine, Ser: 0.7 mg/dL (ref 0.4–1.2)
GFR: 91.53 mL/min (ref >60.00)
Glucose, Bld: 102 mg/dL — ABNORMAL HIGH (ref 70–99)
Potassium: 3.9 mEq/L (ref 3.5–5.1)
Sodium: 137 mEq/L (ref 135–145)

## 2011-10-07 LAB — URINALYSIS, ROUTINE W REFLEX MICROSCOPIC
Bilirubin Urine: NEGATIVE
Hgb urine dipstick: NEGATIVE
Ketones, ur: NEGATIVE
Leukocytes, UA: NEGATIVE
Nitrite: NEGATIVE
Specific Gravity, Urine: 1.02 (ref 1.000–1.030)
Total Protein, Urine: NEGATIVE
Urine Glucose: NEGATIVE
Urobilinogen, UA: 0.2 (ref 0.0–1.0)
pH: 5.5 (ref 5.0–8.0)

## 2011-10-07 LAB — TSH: TSH: 2.1 u[IU]/mL (ref 0.35–5.50)

## 2011-10-07 LAB — HEPATIC FUNCTION PANEL
ALT: 20 U/L (ref 0–35)
AST: 16 U/L (ref 0–37)
Albumin: 4.4 g/dL (ref 3.5–5.2)
Alkaline Phosphatase: 55 U/L (ref 39–117)
Bilirubin, Direct: 0 mg/dL (ref 0.0–0.3)
Total Bilirubin: 0.3 mg/dL (ref 0.3–1.2)
Total Protein: 8 g/dL (ref 6.0–8.3)

## 2011-10-07 LAB — CBC WITH DIFFERENTIAL/PLATELET
Basophils Absolute: 0 10*3/uL (ref 0.0–0.1)
Basophils Relative: 0.4 % (ref 0.0–3.0)
Eosinophils Absolute: 0.2 10*3/uL (ref 0.0–0.7)
Eosinophils Relative: 2.9 % (ref 0.0–5.0)
HCT: 44.1 % (ref 36.0–46.0)
Hemoglobin: 14.1 g/dL (ref 12.0–15.0)
Lymphocytes Relative: 21.3 % (ref 12.0–46.0)
Lymphs Abs: 1.4 10*3/uL (ref 0.7–4.0)
MCHC: 32 g/dL (ref 30.0–36.0)
MCV: 93.2 fl (ref 78.0–100.0)
Monocytes Absolute: 0.6 10*3/uL (ref 0.1–1.0)
Monocytes Relative: 8.9 % (ref 3.0–12.0)
Neutro Abs: 4.5 10*3/uL (ref 1.4–7.7)
Neutrophils Relative %: 66.5 % (ref 43.0–77.0)
Platelets: 384 10*3/uL (ref 150.0–400.0)
RBC: 4.74 Mil/uL (ref 3.87–5.11)
RDW: 13.8 % (ref 11.5–14.6)
WBC: 6.8 10*3/uL (ref 4.5–10.5)

## 2011-10-07 LAB — LIPID PANEL
Cholesterol: 220 mg/dL — ABNORMAL HIGH (ref 0–200)
HDL: 54.5 mg/dL (ref >39.00)
Total CHOL/HDL Ratio: 4
Triglycerides: 150 mg/dL — ABNORMAL HIGH (ref 0.0–149.0)
VLDL: 30 mg/dL (ref 0.0–40.0)

## 2011-10-07 LAB — LDL CHOLESTEROL, DIRECT: Direct LDL: 146.3 mg/dL

## 2011-10-07 MED ORDER — MOMETASONE FUROATE 50 MCG/ACT NA SUSP: 2.0000 | Freq: Every day | NASAL | Status: DC

## 2011-10-07 MED ORDER — BUPROPION HCL ER (SR) 150 MG PO TB12: 150.0000 mg | ORAL_TABLET | Freq: Two times a day (BID) | ORAL | Status: DC

## 2011-10-07 MED ORDER — TEMAZEPAM 15 MG PO CAPS: ORAL_CAPSULE | ORAL | Status: DC

## 2011-10-07 MED ORDER — OMEPRAZOLE 20 MG PO CPDR: 20.0000 mg | DELAYED_RELEASE_CAPSULE | Freq: Every day | ORAL | Status: DC

## 2011-10-07 NOTE — Assessment & Plan Note (Signed)
Encouraged to quit, for wellbutrin re-start

## 2011-10-07 NOTE — Assessment & Plan Note (Signed)
stable overall by hx and exam, most recent data reviewed with pt, and pt to continue medical treatment as before Lab Results  Component Value Date   WBC 7.0 01/08/2010   HGB 13.1 01/08/2010   HCT 39.0 01/08/2010   PLT 408.0* 01/08/2010   GLUCOSE 106* 01/08/2010   CHOL 193 01/08/2010   TRIG 120.0 01/08/2010   HDL 45.90 01/08/2010   LDLCALC 123* 01/08/2010   ALT 24 01/08/2010   AST 25 01/08/2010   NA 142 01/08/2010   K 4.1 01/08/2010   CL 109 01/08/2010   CREATININE 0.6 01/08/2010   BUN 12 01/08/2010   CO2 24 01/08/2010   TSH 1.22 01/08/2010

## 2011-10-07 NOTE — Assessment & Plan Note (Signed)
For lab today 

## 2011-10-07 NOTE — Patient Instructions (Addendum)
You had the flu shot and the tetanus today Please remember to followup with your GYN for the yearly pap smear and/or mammogram as you do Continue all other medications as before Your refill were all done today Please have the pharmacy call with any other refills you may need. Please go to LAB in the [facility] for the blood and/or urine tests to be done today You will be contacted by phone if any changes need to be made immediately.  Otherwise, you will receive a letter about your results with an explanation. You will be contacted regarding the referral for: colonoscopy You are otherwise up to date with prevention measures Your next bone density would be due about April 2014 or 2015 Please stop smoking Please return in 1 year for your yearly visit, or sooner if needed, with Lab testing done 3-5 days before

## 2011-10-07 NOTE — Assessment & Plan Note (Signed)
stable overall by hx and exam, most recent data reviewed with pt, and pt to continue medical treatment as before Lab Results  Component Value Date   LDLCALC 123* 01/08/2010   For lower chol diet

## 2011-10-07 NOTE — Progress Notes (Signed)
Subjective:    Patient ID: Michelle Hubbard, female    DOB: 04/08/1945, 66 y.o.   MRN: [id number]  HPI  Here to f/u; overall doing ok,  Pt denies chest pain, increased sob or doe, wheezing, orthopnea, PND, increased LE swelling, palpitations, dizziness or syncope.  Pt denies new neurological symptoms such as new headache, or facial or extremity weakness or numbness   Pt denies polydipsia, polyuria  Pt states overall good compliance with meds, trying to follow lower cholesterol diet, wt overall stable but little exercise however.  Pleas Koch quite a bit, going to [employer] soon, needs tetanus and flu shots today.  Does have sense of ongoing fatigue, but denies signficant hypersomnolence.  Only taking less than 3 wks of the new myrbetriq for OAB per urology, not working;  Has tried one other (no name) and too much constipation.   Pt denies fever, wt loss, night sweats, loss of appetite, or other constitutional symptoms.  No other acute symptoms. Denies worsening reflux, dysphagia, abd pain, n/v, bowel change or blood.  Still smoking now, but plans to try to quit again soon, just likes it too much. Past Medical History  Diagnosis Date  . DEPRESSIVE DISORDER 12/23/2006  . SINUSITIS- ACUTE-NOS 07/09/2007  . LOW BACK PAIN 12/23/2006  . HYPERLIPIDEMIA 01/08/2010  . 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 COPD 01/08/2010  . OSTEOPOROSIS 01/08/2010  . FATIGUE 01/08/2010  . Vitamin d deficiency 04/30/2010  . OAB (overactive bladder) 10/07/2011   Past Surgical History  Procedure Date  . Breast biopsy 1986  . Oophorectomy bilat 1988    secondary to cyst  . Abdominal hysterectomy     reports that she has been smoking.  She does not have any smokeless tobacco history on file. She reports that she drinks alcohol. She reports that she does not use illicit drugs. family history includes Colon cancer in her brother and mother and Diabetes in her maternal aunt and maternal grandmother. Allergies    Allergen Reactions  . Codeine     REACTION: nausea   Current Outpatient Prescriptions on File Prior to Visit  Medication Sig Dispense Refill  . buPROPion (WELLBUTRIN SR) 150 MG 12 hr tablet Take 150 mg by mouth 2 (two) times daily.        Marland Kitchen denosumab (PROLIA) 60 MG/ML SOLN injection Inject 60 mg into the skin every 6 (six) months. Administer in upper arm, thigh, or abdomen      . mirabegron ER (MYRBETRIQ) 25 MG TB24 Take 25 mg by mouth daily.      . mometasone (NASONEX) 50 MCG/ACT nasal spray Place 2 sprays into the nose daily.      Marland Kitchen omeprazole (PRILOSEC) 20 MG capsule Take 20 mg by mouth daily.      . temazepam (RESTORIL) 15 MG capsule 1-2 tabs by mouth at bedtime as needed for sleep  60 capsule  3   Review of Systems Review of Systems  Constitutional: Negative for diaphoresis and unexpected weight change.  HENT: Negative for drooling and tinnitus.   Eyes: Negative for photophobia and visual disturbance.  Respiratory: Negative for choking and stridor.   Gastrointestinal: Negative for vomiting and blood in stool.  Genitourinary: Negative for hematuria and decreased urine volume.  Musculoskeletal: Negative for gait problem.  Skin: Negative for color change and wound.  Neurological: Negative for tremors and numbness.  Psychiatric/Behavioral: Negative for decreased concentration. The patient is not hyperactive.      Objective:   Physical Exam  BP 128/88  Pulse 77  Temp 97.5 F (36.4 C) (Oral)  Ht 5\' 6"  (1.676 m)  Wt 155 lb 6 oz (70.478 kg)  BMI 25.08 kg/m2  SpO2 97% Physical Exam  VS noted Constitutional: Pt appears well-developed and well-nourished.  HENT: Head: Normocephalic.  Right Ear: External ear normal.  Left Ear: External ear normal.  Eyes: Conjunctivae and EOM are normal. Pupils are equal, round, and reactive to light.  Neck: Normal range of motion. Neck supple.  Cardiovascular: Normal rate and regular rhythm.   Pulmonary/Chest: Effort normal and breath sounds mild  decreased, no rales or wheezing  Abd:  Soft, NT, non-distended, + BS Neurological: Pt is alert. No cranial nerve deficit. motor/sens/dtr intat Skin: Skin is warm. No erythema. No rash Psychiatric: Pt behavior is normal. Thought content normal.     Assessment & Plan:

## 2011-10-07 NOTE — Assessment & Plan Note (Addendum)
stable overall by hx and exam, most recent data reviewed with pt, and pt to continue medical treatment as before SpO2 Readings from Last 3 Encounters:  10/07/11 97%  08/15/11 97%  04/30/10 96%   Note:  Total time for pt hx, exam, review of record with pt in the room, determination of diagnoses and plan for further eval and tx is > 40 min, with over 50% spent in coordination and counseling of patient

## 2011-10-07 NOTE — Assessment & Plan Note (Addendum)
ECG reviewed as per emr, etiolgoy unclear, Etiology unclear, Exam otherwise benign, to check labs as documented, follow with expectant management

## 2011-10-07 NOTE — Assessment & Plan Note (Addendum)
With slight elev PTH level 2011 - for f/u lab today, due for dxa about April 2014/2015

## 2011-10-08 ENCOUNTER — Encounter: Payer: Self-pay | Admitting: Internal Medicine

## 2011-10-08 ENCOUNTER — Other Ambulatory Visit: Payer: Self-pay | Admitting: Internal Medicine

## 2011-10-08 LAB — PTH, INTACT AND CALCIUM
Calcium, Total (PTH): 9.7 mg/dL (ref 8.4–10.5)
PTH: 41 pg/mL (ref 14.0–72.0)

## 2011-10-08 LAB — VITAMIN D 25 HYDROXY (VIT D DEFICIENCY, FRACTURES): Vit D, 25-Hydroxy: 31 ng/mL (ref 30–89)

## 2011-10-08 MED ORDER — ATORVASTATIN CALCIUM 10 MG PO TABS: 10.0000 mg | ORAL_TABLET | Freq: Every day | ORAL | Status: DC

## 2011-12-23 ENCOUNTER — Ambulatory Visit (INDEPENDENT_AMBULATORY_CARE_PROVIDER_SITE_OTHER): Payer: Medicare Other | Admitting: Urology

## 2011-12-23 DIAGNOSIS — R3915 Urgency of urination: Secondary | ICD-10-CM

## 2012-02-18 ENCOUNTER — Encounter: Payer: Self-pay | Admitting: Internal Medicine

## 2012-07-23 ENCOUNTER — Ambulatory Visit: Payer: Medicare Other | Admitting: Internal Medicine

## 2012-08-10 ENCOUNTER — Ambulatory Visit (INDEPENDENT_AMBULATORY_CARE_PROVIDER_SITE_OTHER): Payer: Medicare Other | Admitting: Internal Medicine

## 2012-08-10 ENCOUNTER — Other Ambulatory Visit: Payer: Self-pay | Admitting: Internal Medicine

## 2012-08-10 ENCOUNTER — Encounter: Payer: Self-pay | Admitting: Internal Medicine

## 2012-08-10 VITALS — BP 110/70 | HR 86 | Temp 98.6°F | Ht 67.0 in | Wt 148.0 lb

## 2012-08-10 DIAGNOSIS — G4700 Insomnia, unspecified: Secondary | ICD-10-CM

## 2012-08-10 DIAGNOSIS — F5104 Psychophysiologic insomnia: Secondary | ICD-10-CM

## 2012-08-10 MED ORDER — TEMAZEPAM 15 MG PO CAPS: ORAL_CAPSULE | ORAL | Status: DC

## 2012-08-10 MED ORDER — FLUTICASONE PROPIONATE 50 MCG/ACT NA SUSP: 2.0000 | Freq: Every day | NASAL | Status: DC

## 2012-08-10 NOTE — Telephone Encounter (Signed)
See letter.

## 2012-08-11 NOTE — Progress Notes (Signed)
Pt not seen today, had to leave for another appt

## 2012-08-11 NOTE — Assessment & Plan Note (Signed)
Pt not seen today, had to leave

## 2012-12-16 ENCOUNTER — Other Ambulatory Visit: Payer: Self-pay

## 2012-12-16 DIAGNOSIS — Z1231 Encounter for screening mammogram for malignant neoplasm of breast: Secondary | ICD-10-CM

## 2013-01-10 LAB — HM MAMMOGRAPHY

## 2013-01-24 ENCOUNTER — Ambulatory Visit
Admission: RE | Admit: 2013-01-24 | Discharge: 2013-01-24 | Disposition: A | Discharge status: Home / Self Care | Payer: Medicare Other | Source: Ambulatory Visit

## 2013-01-24 DIAGNOSIS — Z1231 Encounter for screening mammogram for malignant neoplasm of breast: Secondary | ICD-10-CM

## 2013-03-17 ENCOUNTER — Encounter: Payer: Medicare Other | Admitting: Internal Medicine

## 2013-04-19 ENCOUNTER — Ambulatory Visit (INDEPENDENT_AMBULATORY_CARE_PROVIDER_SITE_OTHER): Payer: Medicare Other | Admitting: General Surgery

## 2013-04-19 VITALS — BP 132/78 | HR 68 | Temp 98.4°F | Resp 14 | Ht 67.0 in | Wt 144.0 lb

## 2013-04-19 DIAGNOSIS — K5732 Diverticulitis of large intestine without perforation or abscess without bleeding: Secondary | ICD-10-CM

## 2013-04-19 MED ORDER — DEXTROSE 5 % IV SOLN: 900.0000 mg | INTRAVENOUS | Status: DC

## 2013-04-19 MED ORDER — GENTAMICIN SULFATE 40 MG/ML IJ SOLN: 5.0000 mg/kg | INTRAVENOUS | Status: DC

## 2013-04-19 NOTE — Progress Notes (Signed)
Patient ID: Michelle SkainsNancy K Hubbard, female   DOB: 1945-05-27, 68 y.o.   MRN: [id number]  Chief Complaint  Patient presents with  . Abdominal Pain    new pt    HPI Michelle Skainsancy K Hubbard is a 68 y.o. female.  The patient is a 68 year old female who is referred by Dr. Philippa SicksBuscemi for evaluation of recurrent diverticulitis. This states she's had on and off episodes of diverticulitis pain for approximately 10 years. The patient mainly complains of periumbilical pain that radiates to the left and right lower quadrants. The patient states that her pain is very severe at times. The patient had a family history of colon cancer however recently underwent a colonoscopy in January 2014 which was negative except for severe sigmoid diverticular disease. The patient has been treated several occasions with by mouth antibiotics on an outpatient basis. She states the pain waxes and wanes every 2-3 days. She states that sometimes she has pain episodes for days to weeks at a time. It seems that she does not always take antibiotics prescribed for the diverticular pain at that time. She states the last she took antibiotics wasapproximately a  year ago.  Of note the patient has had previous exploratory laparotomies secondary small bowel obstruction after having a hysterectomy in 88. Lysis of adhesions took place in 2000. According to the patient at that time her entire bowel was covered with adhesions.  HPI  Past Medical History  Diagnosis Date  . DEPRESSIVE DISORDER 12/23/2006  . SINUSITIS- ACUTE-NOS 07/09/2007  . LOW BACK PAIN 12/23/2006  . HYPERLIPIDEMIA 01/08/2010  . 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 COPD 01/08/2010  . OSTEOPOROSIS 01/08/2010  . FATIGUE 01/08/2010  . Vitamin D deficiency 04/30/2010  . OAB (overactive bladder) 10/07/2011    Past Surgical History  Procedure Laterality Date  . Breast biopsy  1986  . Oophorectomy  bilat 1988    secondary to cyst  . Abdominal hysterectomy      Family  History  Problem Relation Age of Onset  . Colon cancer Mother   . Colon cancer Brother   . Diabetes Maternal Aunt   . Diabetes Maternal Grandmother     Social History History  Substance Use Topics  . Smoking status: Current Every Day Smoker  . Smokeless tobacco: Not on file  . Alcohol Use: Yes    Allergies  Allergen Reactions  . Codeine     REACTION: nausea  . Penicillins     Current Outpatient Prescriptions  Medication Sig Dispense Refill  . buPROPion (WELLBUTRIN SR) 150 MG 12 hr tablet Take 1 tablet (150 mg total) by mouth 2 (two) times daily.  60 tablet  11  . fluticasone (FLONASE) 50 MCG/ACT nasal spray Place 2 sprays into the nose daily.  16 g  4  . mirabegron ER (MYRBETRIQ) 25 MG TB24 Take 25 mg by mouth daily.      Marland Kitchen. omeprazole (PRILOSEC) 20 MG capsule Take 1 capsule (20 mg total) by mouth daily.  90 capsule  3  . temazepam (RESTORIL) 15 MG capsule 1-2 tabs by mouth at bedtime as needed for sleep  60 capsule  3   No current facility-administered medications for this visit.    Review of Systems Review of Systems  Constitutional: Negative.   HENT: Negative.   Respiratory: Negative.   Cardiovascular: Negative.   Gastrointestinal: Positive for abdominal pain, constipation, anal bleeding and rectal pain.  Neurological: Negative.   All other systems reviewed and are negative.  Blood pressure 132/78, pulse 68, temperature 98.4 F (36.9 C), temperature source Oral, resp. rate 14, height 5\' 7"  (1.702 m), weight 144 lb (65.318 kg).  Physical Exam Physical Exam  Constitutional: She is oriented to person, place, and time. She appears well-developed and well-nourished.  HENT:  Head: Normocephalic and atraumatic.  Eyes: Conjunctivae and EOM are normal. Pupils are equal, round, and reactive to light.  Neck: Normal range of motion. Neck supple.  Cardiovascular: Normal rate, regular rhythm and normal heart sounds.   Pulmonary/Chest: Effort normal and breath sounds  normal.  Abdominal: Soft. Bowel sounds are normal. She exhibits no distension and no mass. There is no tenderness. There is no rebound and no guarding.    Musculoskeletal: Normal range of motion.  Neurological: She is alert and oriented to person, place, and time.  Skin: Skin is warm and dry.  Psychiatric: She has a normal mood and affect.    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 Long discussion in regards to the possibility that her pain to be a result of her diverticulitis, smaller diverticulitis. The patient at this time would like to move forward with sigmoid colectomy. We'll proceed to the operating room for diagnostic laparoscopy, lysis of adhesions, laparoscopic versus open sigmoidectomy. 2. I discussed with the patient the possible risks and benefits of the procedure to include but not limited to: Infection, bleeding, damage to surrounding structures, possible need for postoperative ileostomy/colostomy, and possible need for further surgery. The patient voiced understanding and wishes to proceed.        Marigene Ehlers., Zanae Kuehnle 04/19/2013, 2:49 PM

## 2013-05-11 ENCOUNTER — Encounter (INDEPENDENT_AMBULATORY_CARE_PROVIDER_SITE_OTHER): Payer: Self-pay

## 2013-05-17 ENCOUNTER — Encounter (INDEPENDENT_AMBULATORY_CARE_PROVIDER_SITE_OTHER): Payer: Self-pay | Admitting: General Surgery

## 2013-05-19 ENCOUNTER — Encounter (HOSPITAL_COMMUNITY): Payer: Self-pay | Admitting: Pharmacy Technician

## 2013-05-20 ENCOUNTER — Telehealth: Payer: Self-pay | Admitting: *Deleted

## 2013-05-20 ENCOUNTER — Encounter: Payer: Self-pay | Admitting: Internal Medicine

## 2013-05-20 DIAGNOSIS — F5104 Psychophysiologic insomnia: Secondary | ICD-10-CM

## 2013-05-20 MED ORDER — TEMAZEPAM 15 MG PO CAPS: ORAL_CAPSULE | ORAL | Status: DC

## 2013-05-20 NOTE — Telephone Encounter (Signed)
Done hardcopy to robin  

## 2013-05-20 NOTE — Telephone Encounter (Signed)
[facility] Pt notified.

## 2013-05-25 NOTE — Patient Instructions (Addendum)
20     Your procedure is scheduled on:  Friday 06/03/2013  Report [facility] at  0530 AM.  Call this number if you have problems the night before or morning of surgery: [phone]   Remember:  FOLLOW BOWEL PREP INSTRUCTIONS FROM DR.RAMIREZ!             IF YOU USE CPAP,BRING MASK AND TUBING AM OF SURGERY!             IF YOU DO NOT HAVE YOUR TYPE AND SCREEN DRAWN AT PRE-ADMIT APPOINTMENT, YOU WILL HAVE IT DRAWN AM OF SURGERY!   Do not eat food or drink liquids AFTER MIDNIGHT!  Take these medicines the morning of surgery with A SIP OF WATER: Zyrtec if needed, Nasacort nasal spray    [facility] IS NOT RESPONSIBLE FOR ANY BELONGINGS OR VALUABLES BROUGHT TO HOSPITAL.  Marland Kitchen.  Leave suitcase in the car. After surgery it may be brought to your room.  For patients admitted to the hospital, checkout time is 11:00 AM the day of              Discharge.    DO NOT WEAR JEWELRY,MAKE-UP,LOTIONS,POWDERS,PERFUMES,CONTACTS , DENTURES OR BRIDGEWORK ,AND DO NOT WEAR FALSE EYELASHES                                    Patients discharged the day of surgery will not be allowed to drive home.  If going home the same day of surgery, must have someone stay with you  first 24 hrs.at home and arrange for someone to drive you home from the  Hospital.                          YOUR DRIVER IS: N/A   Special Instructions:              Please read over the following fact sheets that you were given:             1. [facility] PREPARING FOR SURGERY SHEET              2.[facility]                                        FostoriaBeverly M.Cathlean Saueranney,RN,BSN     [phone]                              [facility] - Preparing for Surgery Before surgery, you can play an important role.  Because skin is not sterile, your skin needs to be as free of germs as possible.  You can reduce the number of germs on your skin by washing with CHG (chlorahexidine gluconate) soap before surgery.  CHG is an antiseptic  cleaner which kills germs and bonds with the skin to continue killing germs even after washing. Please DO NOT use if you have an allergy to CHG or antibacterial soaps.  If your skin becomes reddened/irritated stop using the CHG and inform your nurse when you arrive at Short Stay. Do not shave (including legs and underarms) for at least 48 hours prior to the first CHG shower.  You may shave your face. Please follow these instructions carefully:  1.  Shower with CHG Soap the night before surgery and the  morning of Surgery.  2.  If you choose to wash your hair, wash your hair first as usual with your  normal  shampoo.  3.  After you shampoo, rinse your hair and body thoroughly to remove the  shampoo.                           4.  Use CHG as you would any other liquid soap.  You can apply chg directly  to the skin and wash                       Gently with a scrungie or clean washcloth.  5.  Apply the CHG Soap to your body ONLY FROM THE NECK DOWN.   Do not use on open                           Wound or open sores. Avoid contact with eyes, ears mouth and genitals (private parts).                        Genitals (private parts) with your normal soap.             6.  Wash thoroughly, paying special attention to the area where your surgery  will be performed.  7.  Thoroughly rinse your body with warm water from the neck down.  8.  DO NOT shower/wash with your normal soap after using and rinsing off  the CHG Soap.                9.  Pat yourself dry with a clean towel.            10.  Wear clean pajamas.            11.  Place clean sheets on your bed the night of your first shower and do not  sleep with pets. Day of Surgery : Do not apply any lotions/deodorants the morning of surgery.  Please wear clean clothes to the hospital/surgery center.  FAILURE TO FOLLOW THESE INSTRUCTIONS MAY RESULT IN THE CANCELLATION OF YOUR SURGERY PATIENT SIGNATURE_________________________________  NURSE  SIGNATURE__________________________________  ________________________________________________________________________   Michelle Hubbard  An incentive spirometer is a tool that can help keep your lungs clear and active. This tool measures how well you are filling your lungs with each breath. Taking long deep breaths may help reverse or decrease the chance of developing breathing (pulmonary) problems (especially infection) following:  A long period of time when you are unable to move or be active. BEFORE THE PROCEDURE   If the spirometer includes an indicator to show your best effort, your nurse or respiratory therapist will set it to a desired goal.  If possible, sit up straight or lean slightly forward. Try not to slouch.  Hold the incentive spirometer in an upright position. INSTRUCTIONS FOR USE  1. Sit on the edge of your bed if possible, or sit up as far as you can in bed or on a chair. 2. Hold the incentive spirometer in an upright position. 3. Breathe out normally. 4. Place the mouthpiece in your mouth and seal your lips tightly around it. 5. Breathe in slowly and as deeply as possible, raising the piston or the ball toward the top of the column. 6. Hold your breath for  3-5 seconds or for as long as possible. Allow the piston or ball to fall to the bottom of the column. 7. Remove the mouthpiece from your mouth and breathe out normally. 8. Rest for a few seconds and repeat Steps 1 through 7 at least 10 times every 1-2 hours when you are awake. Take your time and take a few normal breaths between deep breaths. 9. The spirometer may include an indicator to show your best effort. Use the indicator as a goal to work toward during each repetition. 10. After each set of 10 deep breaths, practice coughing to be sure your lungs are clear. If you have an incision (the cut made at the time of surgery), support your incision when coughing by placing a pillow or rolled up towels firmly  against it. Once you are able to get out of bed, walk around indoors and cough well. You may stop using the incentive spirometer when instructed by your caregiver.  RISKS AND COMPLICATIONS  Take your time so you do not get dizzy or light-headed.  If you are in pain, you may need to take or ask for pain medication before doing incentive spirometry. It is harder to take a deep breath if you are having pain. AFTER USE  Rest and breathe slowly and easily.  It can be helpful to keep track of a log of your progress. Your caregiver can provide you with a simple table to help with this. If you are using the spirometer at home, follow these instructions: [place] IF:   You are having difficultly using the spirometer.  You have trouble using the spirometer as often as instructed.  Your pain medication is not giving enough relief while using the spirometer.  You develop fever of 100.5 F (38.1 C) or higher. SEEK IMMEDIATE MEDICAL CARE IF:   You cough up bloody sputum that had not been present before.  You develop fever of 102 F (38.9 C) or greater.  You develop worsening pain at or near the incision site. MAKE SURE YOU:   Understand these instructions.  Will watch your condition.  Will get help right away if you are not doing well or get worse. Document Released: 05/26/2006 Document Revised: 04/07/2011 Document Reviewed: 07/27/2006 [employer] Patient Information 2014 [employer], [place].   ________________________________________________________________________  WHAT IS A BLOOD TRANSFUSION? Blood Transfusion Information  A transfusion is the replacement of blood or some of its parts. Blood is made up of multiple cells which provide different functions.  Red blood cells carry oxygen and are used for blood loss replacement.  White blood cells fight against infection.  Platelets control bleeding.  Plasma helps clot blood.  Other blood products are available for  specialized needs, such as hemophilia or other clotting disorders. BEFORE THE TRANSFUSION  Who gives blood for transfusions?   Healthy volunteers who are fully evaluated to make sure their blood is safe. This is blood bank blood. Transfusion therapy is the safest it has ever been in the practice of medicine. Before blood is taken from a donor, a complete history is taken to make sure that person has no history of diseases nor engages in risky social behavior (examples are intravenous drug use or sexual activity with multiple partners). The donor's travel history is screened to minimize risk of transmitting infections, such as malaria. The donated blood is tested for signs of infectious diseases, such as HIV and hepatitis. The blood is then tested to be sure it is compatible with you in  order to minimize the chance of a transfusion reaction. If you or a relative donates blood, this is often done in anticipation of surgery and is not appropriate for emergency situations. It takes many days to process the donated blood. RISKS AND COMPLICATIONS Although transfusion therapy is very safe and saves many lives, the main dangers of transfusion include:   Getting an infectious disease.  Developing a transfusion reaction. This is an allergic reaction to something in the blood you were given. Every precaution is taken to prevent this. The decision to have a blood transfusion has been considered carefully by your caregiver before blood is given. Blood is not given unless the benefits outweigh the risks. AFTER THE TRANSFUSION  Right after receiving a blood transfusion, you will usually feel much better and more energetic. This is especially true if your red blood cells have gotten low (anemic). The transfusion raises the level of the red blood cells which carry oxygen, and this usually causes an energy increase.  The nurse administering the transfusion will monitor you carefully for complications. HOME CARE  INSTRUCTIONS  No special instructions are needed after a transfusion. You may find your energy is better. Speak with your caregiver about any limitations on activity for underlying diseases you may have. SEEK MEDICAL CARE IF:   Your condition is not improving after your transfusion.  You develop redness or irritation at the intravenous (IV) site. SEEK IMMEDIATE MEDICAL CARE IF:  Any of the following symptoms occur over the next 12 hours:  Shaking chills.  You have a temperature by mouth above 102 F (38.9 C), not controlled by medicine.  Chest, back, or muscle pain.  People around you feel you are not acting correctly or are confused.  Shortness of breath or difficulty breathing.  Dizziness and fainting.  You get a rash or develop hives.  You have a decrease in urine output.  Your urine turns a dark color or changes to pink, red, or brown. Any of the following symptoms occur over the next 10 days:  You have a temperature by mouth above 102 F (38.9 C), not controlled by medicine.  Shortness of breath.  Weakness after normal activity.  The white part of the eye turns yellow (jaundice).  You have a decrease in the amount of urine or are urinating less often.  Your urine turns a dark color or changes to pink, red, or brown. Document Released: 01/11/2000 Document Revised: 04/07/2011 Document Reviewed: 08/30/2007 [facility] Patient Information 2014 [place], [place].  _______________________________________________________________________

## 2013-05-27 ENCOUNTER — Ambulatory Visit (HOSPITAL_COMMUNITY)
Admission: RE | Admit: 2013-05-27 | Discharge: 2013-05-27 | Disposition: A | Discharge status: Home / Self Care | Payer: Medicare Other | Source: Ambulatory Visit | Attending: Anesthesiology | Admitting: Anesthesiology

## 2013-05-27 ENCOUNTER — Encounter (HOSPITAL_COMMUNITY): Payer: Self-pay

## 2013-05-27 ENCOUNTER — Encounter (HOSPITAL_COMMUNITY)
Admission: RE | Admit: 2013-05-27 | Discharge: 2013-05-27 | Disposition: A | Discharge status: Home / Self Care | Payer: Medicare Other | Source: Ambulatory Visit | Attending: General Surgery | Admitting: General Surgery

## 2013-05-27 DIAGNOSIS — Z01811 Encounter for preprocedural respiratory examination: Secondary | ICD-10-CM | POA: Insufficient documentation

## 2013-05-27 DIAGNOSIS — K5732 Diverticulitis of large intestine without perforation or abscess without bleeding: Secondary | ICD-10-CM | POA: Insufficient documentation

## 2013-05-27 DIAGNOSIS — Z01810 Encounter for preprocedural cardiovascular examination: Secondary | ICD-10-CM | POA: Insufficient documentation

## 2013-05-27 DIAGNOSIS — Z01812 Encounter for preprocedural laboratory examination: Secondary | ICD-10-CM | POA: Insufficient documentation

## 2013-05-27 HISTORY — DX: Nausea with vomiting, unspecified: R11.2

## 2013-05-27 HISTORY — DX: Other specified postprocedural states: Z98.890

## 2013-05-27 LAB — COMPREHENSIVE METABOLIC PANEL
ALT: 13 U/L (ref 0–35)
AST: 19 U/L (ref 0–37)
Albumin: 3.7 g/dL (ref 3.5–5.2)
Alkaline Phosphatase: 80 U/L (ref 39–117)
BUN: 13 mg/dL (ref 6–23)
CO2: 24 mEq/L (ref 19–32)
Calcium: 9 mg/dL (ref 8.4–10.5)
Chloride: 102 mEq/L (ref 96–112)
Creatinine, Ser: 0.62 mg/dL (ref 0.50–1.10)
GFR calc Af Amer: >90 mL/min (ref >90)
GFR calc non Af Amer: 90 mL/min — ABNORMAL LOW (ref >90)
Glucose, Bld: 94 mg/dL (ref 70–99)
Potassium: 4.3 mEq/L (ref 3.7–5.3)
Sodium: 137 mEq/L (ref 137–147)
Total Bilirubin: 0.2 mg/dL — ABNORMAL LOW (ref 0.3–1.2)
Total Protein: 7 g/dL (ref 6.0–8.3)

## 2013-05-27 LAB — CBC WITH DIFFERENTIAL/PLATELET
Basophils Absolute: 0 10*3/uL (ref 0.0–0.1)
Basophils Relative: 0 % (ref 0–1)
Eosinophils Absolute: 0.1 10*3/uL (ref 0.0–0.7)
Eosinophils Relative: 2 % (ref 0–5)
HCT: 41.8 % (ref 36.0–46.0)
Hemoglobin: 13.6 g/dL (ref 12.0–15.0)
Lymphocytes Relative: 34 % (ref 12–46)
Lymphs Abs: 2.1 10*3/uL (ref 0.7–4.0)
MCH: 29.7 pg (ref 26.0–34.0)
MCHC: 32.5 g/dL (ref 30.0–36.0)
MCV: 91.3 fL (ref 78.0–100.0)
Monocytes Absolute: 0.7 10*3/uL (ref 0.1–1.0)
Monocytes Relative: 11 % (ref 3–12)
Neutro Abs: 3.3 10*3/uL (ref 1.7–7.7)
Neutrophils Relative %: 53 % (ref 43–77)
Platelets: 356 10*3/uL (ref 150–400)
RBC: 4.58 MIL/uL (ref 3.87–5.11)
RDW: 13.4 % (ref 11.5–15.5)
WBC: 6.2 10*3/uL (ref 4.0–10.5)

## 2013-05-27 LAB — PROTIME-INR
INR: 0.96 (ref 0.00–1.49)
Prothrombin Time: 12.6 seconds (ref 11.6–15.2)

## 2013-05-30 ENCOUNTER — Encounter (INDEPENDENT_AMBULATORY_CARE_PROVIDER_SITE_OTHER): Payer: Self-pay | Admitting: General Surgery

## 2013-05-31 ENCOUNTER — Encounter: Payer: Self-pay | Admitting: Internal Medicine

## 2013-06-02 NOTE — Anesthesia Preprocedure Evaluation (Addendum)
Anesthesia Evaluation  Patient identified by MRN, date of birth, ID band Patient awake    Reviewed: Allergy & Precautions, H&P , NPO status , Patient's Chart, lab work & pertinent test results  Airway Mallampati: II TM Distance: >3 FB Neck ROM: Full    Dental no notable dental hx.    Pulmonary Current Smoker,  breath sounds clear to auscultation  Pulmonary exam normal       Cardiovascular negative cardio ROS  Rhythm:Regular Rate:Normal     Neuro/Psych negative neurological ROS  negative psych ROS   GI/Hepatic negative GI ROS, Neg liver ROS,   Endo/Other  negative endocrine ROS  Renal/GU negative Renal ROS  negative genitourinary   Musculoskeletal negative musculoskeletal ROS (+)   Abdominal   Peds negative pediatric ROS (+)  Hematology negative hematology ROS (+)   Anesthesia Other Findings   Reproductive/Obstetrics negative OB ROS                          Anesthesia Physical Anesthesia Plan  ASA: II  Anesthesia Plan: General   Post-op Pain Management:    Induction: Intravenous  Airway Management Planned: Oral ETT  Additional Equipment:   Intra-op Plan:   Post-operative Plan: Extubation in OR  Informed Consent: I have reviewed the patients History and Physical, chart, labs and discussed the procedure including the risks, benefits and alternatives for the proposed anesthesia with the patient or authorized representative who has indicated his/her understanding and acceptance.   Dental advisory given  Plan Discussed with: CRNA and Surgeon  Anesthesia Plan Comments:         Anesthesia Quick Evaluation  

## 2013-06-03 ENCOUNTER — Inpatient Hospital Stay (HOSPITAL_COMMUNITY): Payer: Medicare Other | Admitting: Anesthesiology

## 2013-06-03 ENCOUNTER — Inpatient Hospital Stay (HOSPITAL_COMMUNITY)
Admission: RE | Admit: 2013-06-03 | Discharge: 2013-06-09 | DRG: 331 | Disposition: A | Discharge status: Home / Self Care | Payer: Medicare Other | Source: Ambulatory Visit | Attending: General Surgery | Admitting: General Surgery

## 2013-06-03 ENCOUNTER — Encounter (HOSPITAL_COMMUNITY)
Admission: RE | Disposition: A | Discharge status: Home / Self Care | Payer: Self-pay | Source: Ambulatory Visit | Attending: General Surgery

## 2013-06-03 ENCOUNTER — Encounter (HOSPITAL_COMMUNITY): Payer: Self-pay | Admitting: Anesthesiology

## 2013-06-03 ENCOUNTER — Encounter (HOSPITAL_COMMUNITY): Payer: Medicare Other | Admitting: Anesthesiology

## 2013-06-03 DIAGNOSIS — J4489 Other specified chronic obstructive pulmonary disease: Secondary | ICD-10-CM | POA: Diagnosis present

## 2013-06-03 DIAGNOSIS — N318 Other neuromuscular dysfunction of bladder: Secondary | ICD-10-CM | POA: Diagnosis present

## 2013-06-03 DIAGNOSIS — H9190 Unspecified hearing loss, unspecified ear: Secondary | ICD-10-CM | POA: Diagnosis present

## 2013-06-03 DIAGNOSIS — K660 Peritoneal adhesions (postprocedural) (postinfection): Secondary | ICD-10-CM | POA: Diagnosis present

## 2013-06-03 DIAGNOSIS — R110 Nausea: Secondary | ICD-10-CM | POA: Diagnosis not present

## 2013-06-03 DIAGNOSIS — J449 Chronic obstructive pulmonary disease, unspecified: Secondary | ICD-10-CM | POA: Diagnosis present

## 2013-06-03 DIAGNOSIS — E785 Hyperlipidemia, unspecified: Secondary | ICD-10-CM | POA: Diagnosis present

## 2013-06-03 DIAGNOSIS — Z79899 Other long term (current) drug therapy: Secondary | ICD-10-CM

## 2013-06-03 DIAGNOSIS — K5732 Diverticulitis of large intestine without perforation or abscess without bleeding: Principal | ICD-10-CM | POA: Diagnosis present

## 2013-06-03 DIAGNOSIS — Z880 Allergy status to penicillin: Secondary | ICD-10-CM

## 2013-06-03 DIAGNOSIS — F329 Major depressive disorder, single episode, unspecified: Secondary | ICD-10-CM | POA: Diagnosis present

## 2013-06-03 DIAGNOSIS — Z9049 Acquired absence of other specified parts of digestive tract: Secondary | ICD-10-CM

## 2013-06-03 DIAGNOSIS — K5712 Diverticulitis of small intestine without perforation or abscess without bleeding: Secondary | ICD-10-CM

## 2013-06-03 DIAGNOSIS — Z885 Allergy status to narcotic agent status: Secondary | ICD-10-CM

## 2013-06-03 DIAGNOSIS — M810 Age-related osteoporosis without current pathological fracture: Secondary | ICD-10-CM | POA: Diagnosis present

## 2013-06-03 DIAGNOSIS — Z5331 Laparoscopic surgical procedure converted to open procedure: Secondary | ICD-10-CM

## 2013-06-03 DIAGNOSIS — F3289 Other specified depressive episodes: Secondary | ICD-10-CM | POA: Diagnosis present

## 2013-06-03 DIAGNOSIS — Z833 Family history of diabetes mellitus: Secondary | ICD-10-CM

## 2013-06-03 DIAGNOSIS — Z800 Family history of malignant neoplasm of digestive organs: Secondary | ICD-10-CM

## 2013-06-03 DIAGNOSIS — F17200 Nicotine dependence, unspecified, uncomplicated: Secondary | ICD-10-CM | POA: Diagnosis present

## 2013-06-03 HISTORY — PX: COLON RESECTION: SHX5231

## 2013-06-03 LAB — TYPE AND SCREEN
ABO/RH(D): A POS
Antibody Screen: NEGATIVE

## 2013-06-03 LAB — ABO/RH: ABO/RH(D): A POS

## 2013-06-03 LAB — CBC
HCT: 39.7 % (ref 36.0–46.0)
Hemoglobin: 13 g/dL (ref 12.0–15.0)
MCH: 30.4 pg (ref 26.0–34.0)
MCHC: 32.7 g/dL (ref 30.0–36.0)
MCV: 92.8 fL (ref 78.0–100.0)
Platelets: 356 10*3/uL (ref 150–400)
RBC: 4.28 MIL/uL (ref 3.87–5.11)
RDW: 13.7 % (ref 11.5–15.5)
WBC: 15.7 10*3/uL — ABNORMAL HIGH (ref 4.0–10.5)

## 2013-06-03 LAB — CREATININE, SERUM
Creatinine, Ser: 0.72 mg/dL (ref 0.50–1.10)
GFR calc Af Amer: >90 mL/min (ref >90)
GFR calc non Af Amer: 86 mL/min — ABNORMAL LOW (ref >90)

## 2013-06-03 SURGERY — LAPAROSCOPIC SIGMOID COLON RESECTION
Anesthesia: General | Site: Abdomen

## 2013-06-03 MED ORDER — EPHEDRINE SULFATE 50 MG/ML IJ SOLN
INTRAMUSCULAR | Status: DC | PRN
  Administered 2013-06-03 (×2): 5 mg via INTRAVENOUS

## 2013-06-03 MED ORDER — SUFENTANIL CITRATE 50 MCG/ML IV SOLN
INTRAVENOUS | Status: AC
  Filled 2013-06-03: qty 1

## 2013-06-03 MED ORDER — BUPIVACAINE HCL (PF) 0.25 % IJ SOLN
INTRAMUSCULAR | Status: DC | PRN
  Administered 2013-06-03: 5 mL

## 2013-06-03 MED ORDER — MIDAZOLAM HCL 2 MG/2ML IJ SOLN
INTRAMUSCULAR | Status: AC
  Filled 2013-06-03: qty 2

## 2013-06-03 MED ORDER — METOCLOPRAMIDE HCL 5 MG/ML IJ SOLN
INTRAMUSCULAR | Status: AC
  Filled 2013-06-03: qty 2

## 2013-06-03 MED ORDER — PROPOFOL 10 MG/ML IV BOLUS
INTRAVENOUS | Status: AC
  Filled 2013-06-03: qty 20

## 2013-06-03 MED ORDER — DIPHENHYDRAMINE HCL 50 MG/ML IJ SOLN
12.5000 mg | Freq: Four times a day (QID) | INTRAMUSCULAR | Status: DC | PRN
  Administered 2013-06-04: 12.5 mg via INTRAVENOUS
  Filled 2013-06-03: qty 1

## 2013-06-03 MED ORDER — NEOSTIGMINE METHYLSULFATE 10 MG/10ML IV SOLN
INTRAVENOUS | Status: AC
  Filled 2013-06-03: qty 1

## 2013-06-03 MED ORDER — ONDANSETRON HCL 4 MG/2ML IJ SOLN
INTRAMUSCULAR | Status: AC
  Filled 2013-06-03: qty 2

## 2013-06-03 MED ORDER — HYDROMORPHONE HCL PF 1 MG/ML IJ SOLN
1.0000 mg | INTRAMUSCULAR | Status: DC | PRN
  Administered 2013-06-03 – 2013-06-09 (×8): 1 mg via INTRAVENOUS
  Filled 2013-06-03 (×6): qty 1

## 2013-06-03 MED ORDER — ONDANSETRON HCL 4 MG/2ML IJ SOLN
4.0000 mg | Freq: Four times a day (QID) | INTRAMUSCULAR | Status: DC | PRN
  Administered 2013-06-03 – 2013-06-06 (×3): 4 mg via INTRAVENOUS
  Filled 2013-06-03 (×3): qty 2

## 2013-06-03 MED ORDER — 0.9 % SODIUM CHLORIDE (POUR BTL) OPTIME
TOPICAL | Status: DC | PRN
  Administered 2013-06-03: 3000 mL

## 2013-06-03 MED ORDER — ALVIMOPAN 12 MG PO CAPS
12.0000 mg | ORAL_CAPSULE | Freq: Two times a day (BID) | ORAL | Status: DC
  Administered 2013-06-04 – 2013-06-07 (×8): 12 mg via ORAL
  Filled 2013-06-03 (×10): qty 1

## 2013-06-03 MED ORDER — HYDROMORPHONE HCL PF 1 MG/ML IJ SOLN
INTRAMUSCULAR | Status: DC | PRN
  Administered 2013-06-03 (×5): .4 mg via INTRAVENOUS

## 2013-06-03 MED ORDER — METOCLOPRAMIDE HCL 5 MG/ML IJ SOLN
INTRAMUSCULAR | Status: DC | PRN
  Administered 2013-06-03: 10 mg via INTRAVENOUS

## 2013-06-03 MED ORDER — NEOSTIGMINE METHYLSULFATE 10 MG/10ML IV SOLN
INTRAVENOUS | Status: DC | PRN
  Administered 2013-06-03: 4 mg via INTRAVENOUS

## 2013-06-03 MED ORDER — BUPIVACAINE-EPINEPHRINE 0.25% -1:200000 IJ SOLN
INTRAMUSCULAR | Status: AC
  Filled 2013-06-03: qty 1

## 2013-06-03 MED ORDER — SACCHAROMYCES BOULARDII 250 MG PO CAPS
250.0000 mg | ORAL_CAPSULE | Freq: Two times a day (BID) | ORAL | Status: DC
  Administered 2013-06-03 – 2013-06-09 (×12): 250 mg via ORAL
  Filled 2013-06-03 (×14): qty 1

## 2013-06-03 MED ORDER — CISATRACURIUM BESYLATE (PF) 10 MG/5ML IV SOLN
INTRAVENOUS | Status: DC | PRN
  Administered 2013-06-03: 3 mg via INTRAVENOUS
  Administered 2013-06-03: 10 mg via INTRAVENOUS
  Administered 2013-06-03: 4 mg via INTRAVENOUS
  Administered 2013-06-03 (×2): 3 mg via INTRAVENOUS

## 2013-06-03 MED ORDER — SUFENTANIL CITRATE 50 MCG/ML IV SOLN
INTRAVENOUS | Status: DC | PRN
  Administered 2013-06-03 (×5): 10 ug via INTRAVENOUS

## 2013-06-03 MED ORDER — LACTATED RINGERS IV SOLN
INTRAVENOUS | Status: DC | PRN
  Administered 2013-06-03 (×3): via INTRAVENOUS

## 2013-06-03 MED ORDER — CISATRACURIUM BESYLATE 20 MG/10ML IV SOLN
INTRAVENOUS | Status: AC
  Filled 2013-06-03: qty 10

## 2013-06-03 MED ORDER — HYDROMORPHONE HCL PF 2 MG/ML IJ SOLN
INTRAMUSCULAR | Status: AC
  Filled 2013-06-03: qty 1

## 2013-06-03 MED ORDER — DIPHENHYDRAMINE HCL 12.5 MG/5ML PO ELIX: 12.5000 mg | ORAL_SOLUTION | Freq: Four times a day (QID) | ORAL | Status: DC | PRN

## 2013-06-03 MED ORDER — GLYCOPYRROLATE 0.2 MG/ML IJ SOLN
INTRAMUSCULAR | Status: AC
  Filled 2013-06-03: qty 3

## 2013-06-03 MED ORDER — PROMETHAZINE HCL 25 MG/ML IJ SOLN: 6.2500 mg | INTRAMUSCULAR | Status: DC | PRN

## 2013-06-03 MED ORDER — EPHEDRINE SULFATE 50 MG/ML IJ SOLN
INTRAMUSCULAR | Status: AC
  Filled 2013-06-03: qty 1

## 2013-06-03 MED ORDER — ALVIMOPAN 12 MG PO CAPS
12.0000 mg | ORAL_CAPSULE | Freq: Once | ORAL | Status: AC
  Administered 2013-06-03: 12 mg via ORAL
  Filled 2013-06-03: qty 1

## 2013-06-03 MED ORDER — DEXAMETHASONE SODIUM PHOSPHATE 10 MG/ML IJ SOLN
INTRAMUSCULAR | Status: DC | PRN
  Administered 2013-06-03: 10 mg via INTRAVENOUS

## 2013-06-03 MED ORDER — ENOXAPARIN SODIUM 40 MG/0.4ML ~~LOC~~ SOLN
40.0000 mg | SUBCUTANEOUS | Status: DC
  Administered 2013-06-04 – 2013-06-09 (×6): 40 mg via SUBCUTANEOUS
  Filled 2013-06-03 (×7): qty 0.4

## 2013-06-03 MED ORDER — MIDAZOLAM HCL 5 MG/5ML IJ SOLN
INTRAMUSCULAR | Status: DC | PRN
  Administered 2013-06-03: 2 mg via INTRAVENOUS

## 2013-06-03 MED ORDER — SODIUM CHLORIDE 0.9 % IJ SOLN
INTRAMUSCULAR | Status: AC
  Filled 2013-06-03: qty 10

## 2013-06-03 MED ORDER — PROMETHAZINE HCL 25 MG/ML IJ SOLN
12.5000 mg | Freq: Once | INTRAMUSCULAR | Status: AC
  Administered 2013-06-03: 12.5 mg via INTRAVENOUS
  Filled 2013-06-03: qty 1

## 2013-06-03 MED ORDER — LIDOCAINE HCL (CARDIAC) 20 MG/ML IV SOLN
INTRAVENOUS | Status: DC | PRN
  Administered 2013-06-03: 100 mg via INTRAVENOUS

## 2013-06-03 MED ORDER — ACETAMINOPHEN 10 MG/ML IV SOLN
1000.0000 mg | Freq: Once | INTRAVENOUS | Status: AC
  Administered 2013-06-03: 1000 mg via INTRAVENOUS
  Filled 2013-06-03: qty 100

## 2013-06-03 MED ORDER — LACTATED RINGERS IR SOLN
Status: DC | PRN
  Administered 2013-06-03: 1000 mL

## 2013-06-03 MED ORDER — PROPOFOL 10 MG/ML IV BOLUS
INTRAVENOUS | Status: DC | PRN
  Administered 2013-06-03: 120 mg via INTRAVENOUS

## 2013-06-03 MED ORDER — CIPROFLOXACIN IN D5W 400 MG/200ML IV SOLN
400.0000 mg | Freq: Once | INTRAVENOUS | Status: AC
  Administered 2013-06-03: 400 mg via INTRAVENOUS
  Filled 2013-06-03: qty 200

## 2013-06-03 MED ORDER — CIPROFLOXACIN IN D5W 400 MG/200ML IV SOLN
INTRAVENOUS | Status: AC
  Filled 2013-06-03: qty 200

## 2013-06-03 MED ORDER — HYDROMORPHONE 0.3 MG/ML IV SOLN
INTRAVENOUS | Status: AC
  Filled 2013-06-03: qty 25

## 2013-06-03 MED ORDER — LIDOCAINE HCL (CARDIAC) 20 MG/ML IV SOLN
INTRAVENOUS | Status: AC
  Filled 2013-06-03: qty 5

## 2013-06-03 MED ORDER — CLINDAMYCIN PHOSPHATE 900 MG/50ML IV SOLN
900.0000 mg | Freq: Three times a day (TID) | INTRAVENOUS | Status: AC
Start: 2013-06-03 — End: 2013-06-03
  Administered 2013-06-03: 900 mg via INTRAVENOUS
  Filled 2013-06-03: qty 50

## 2013-06-03 MED ORDER — METRONIDAZOLE IN NACL 5-0.79 MG/ML-% IV SOLN
INTRAVENOUS | Status: AC
  Filled 2013-06-03: qty 100

## 2013-06-03 MED ORDER — SODIUM CHLORIDE 0.9 % IJ SOLN: 9.0000 mL | INTRAMUSCULAR | Status: DC | PRN

## 2013-06-03 MED ORDER — DEXTROSE-NACL 5-0.9 % IV SOLN
INTRAVENOUS | Status: DC
  Administered 2013-06-03 – 2013-06-09 (×11): via INTRAVENOUS

## 2013-06-03 MED ORDER — ONDANSETRON HCL 4 MG/2ML IJ SOLN
INTRAMUSCULAR | Status: DC | PRN
  Administered 2013-06-03: 4 mg via INTRAVENOUS

## 2013-06-03 MED ORDER — DEXAMETHASONE SODIUM PHOSPHATE 10 MG/ML IJ SOLN
INTRAMUSCULAR | Status: AC
  Filled 2013-06-03: qty 1

## 2013-06-03 MED ORDER — GLYCOPYRROLATE 0.2 MG/ML IJ SOLN
INTRAMUSCULAR | Status: DC | PRN
  Administered 2013-06-03: .6 mg via INTRAVENOUS

## 2013-06-03 MED ORDER — HYDROMORPHONE HCL PF 1 MG/ML IJ SOLN
0.2500 mg | INTRAMUSCULAR | Status: DC | PRN
Start: 2013-06-03 — End: 2013-06-03

## 2013-06-03 MED ORDER — NALOXONE HCL 0.4 MG/ML IJ SOLN: 0.4000 mg | INTRAMUSCULAR | Status: DC | PRN

## 2013-06-03 MED ORDER — METRONIDAZOLE IN NACL 5-0.79 MG/ML-% IV SOLN
500.0000 mg | Freq: Once | INTRAVENOUS | Status: AC
Start: 2013-06-03 — End: 2013-06-03
  Administered 2013-06-03: 500 mg via INTRAVENOUS
  Filled 2013-06-03: qty 100

## 2013-06-03 MED ORDER — HYDROMORPHONE 0.3 MG/ML IV SOLN
INTRAVENOUS | Status: DC
  Administered 2013-06-03: 15:00:00 via INTRAVENOUS
  Administered 2013-06-03: 5.8 mg via INTRAVENOUS
  Administered 2013-06-03: 3.2 mg via INTRAVENOUS
  Administered 2013-06-03: 11:00:00 via INTRAVENOUS
  Administered 2013-06-03: 5.9 mg via INTRAVENOUS
  Administered 2013-06-04: 16:00:00 via INTRAVENOUS
  Administered 2013-06-04: 8.89 mg via INTRAVENOUS
  Administered 2013-06-04: 3.6 mg via INTRAVENOUS
  Administered 2013-06-04: 3 mg via INTRAVENOUS
  Administered 2013-06-04: 88.92 mg via INTRAVENOUS
  Administered 2013-06-04: 3 mg via INTRAVENOUS
  Administered 2013-06-04: 8.6 mg via INTRAVENOUS
  Administered 2013-06-05: 9.69 mg via INTRAVENOUS
  Administered 2013-06-05: 1.8 mg via INTRAVENOUS
  Administered 2013-06-05: 2.4 mg via INTRAVENOUS
  Administered 2013-06-05: 1.2 mg via INTRAVENOUS
  Administered 2013-06-05: 0.6 mg via INTRAVENOUS
  Administered 2013-06-05: 1.2 mg via INTRAVENOUS
  Administered 2013-06-05: 02:00:00 via INTRAVENOUS
  Administered 2013-06-05: 2.57 mg via INTRAVENOUS
  Administered 2013-06-06: 0.6 mg via INTRAVENOUS
  Administered 2013-06-06: 2.7 mg via INTRAVENOUS
  Administered 2013-06-06: 2.1 mg via INTRAVENOUS
  Filled 2013-06-03 (×6): qty 25

## 2013-06-03 SURGICAL SUPPLY — 86 items
APPLIER CLIP 5 13 M/L LIGAMAX5 (MISCELLANEOUS)
APPLIER CLIP ROT 10 11.4 M/L (STAPLE)
BIOPATCH BLUE 3/4IN DISK W/1.5 (GAUZE/BANDAGES/DRESSINGS) ×3 IMPLANT
BLADE EXTENDED COATED 6.5IN (ELECTRODE) ×3 IMPLANT
BLADE HEX COATED 2.75 (ELECTRODE) ×3 IMPLANT
BLADE SURG SZ10 CARB STEEL (BLADE) ×3 IMPLANT
CABLE HIGH FREQUENCY MONO STRZ (ELECTRODE) ×3 IMPLANT
CANISTER SUCTION 2500CC (MISCELLANEOUS) ×3 IMPLANT
CELLS DAT CNTRL 66122 CELL SVR (MISCELLANEOUS) IMPLANT
CLAMP ENDO BABCK 10MM (STAPLE) IMPLANT
CLIP APPLIE 5 13 M/L LIGAMAX5 (MISCELLANEOUS) IMPLANT
CLIP APPLIE ROT 10 11.4 M/L (STAPLE) IMPLANT
CLOSURE WOUND 1/2 X4 (GAUZE/BANDAGES/DRESSINGS)
CONNECTOR 5 IN 1 STRAIGHT STRL (MISCELLANEOUS) IMPLANT
COVER MAYO STAND STRL (DRAPES) ×3 IMPLANT
COVER SURGICAL LIGHT HANDLE (MISCELLANEOUS) ×3 IMPLANT
DECANTER SPIKE VIAL GLASS SM (MISCELLANEOUS) ×3 IMPLANT
DEVICE TROCAR PUNCTURE CLOSURE (ENDOMECHANICALS) IMPLANT
DRAPE LAPAROSCOPIC ABDOMINAL (DRAPES) ×3 IMPLANT
DRAPE LG THREE QUARTER DISP (DRAPES) ×3 IMPLANT
DRAPE WARM FLUID 44X44 (DRAPE) ×3 IMPLANT
DRSG OPSITE POSTOP 4X8 (GAUZE/BANDAGES/DRESSINGS) ×3 IMPLANT
ELECT REM PT RETURN 9FT ADLT (ELECTROSURGICAL) ×3
ELECTRODE REM PT RTRN 9FT ADLT (ELECTROSURGICAL) ×1 IMPLANT
GLOVE BIOGEL PI IND STRL 7.0 (GLOVE) ×1 IMPLANT
GLOVE BIOGEL PI INDICATOR 7.0 (GLOVE) ×2
GOWN STRL REUS W/TWL LRG LVL3 (GOWN DISPOSABLE) ×6 IMPLANT
GOWN STRL REUS W/TWL XL LVL3 (GOWN DISPOSABLE) ×9 IMPLANT
IV LACTATED RINGER IRRG 3000ML (IV SOLUTION) ×2
IV LR IRRIG 3000ML ARTHROMATIC (IV SOLUTION) ×1 IMPLANT
KIT BASIN OR (CUSTOM PROCEDURE TRAY) ×3 IMPLANT
LEGGING LITHOTOMY PAIR STRL (DRAPES) ×3 IMPLANT
NEEDLE INSUFFLATION 14GA 120MM (NEEDLE) ×3 IMPLANT
NS IRRIG 1000ML POUR BTL (IV SOLUTION) ×6 IMPLANT
PACK GENERAL/GYN (CUSTOM PROCEDURE TRAY) ×3 IMPLANT
PENCIL BUTTON HOLSTER BLD 10FT (ELECTRODE) ×3 IMPLANT
RELOAD EGIA 45 MED/THCK PURPLE (STAPLE) IMPLANT
RELOAD EGIA 45 TAN VASC (STAPLE) IMPLANT
RELOAD EGIA 60 MED/THCK PURPLE (STAPLE) IMPLANT
RELOAD EGIA 60 TAN VASC (STAPLE) IMPLANT
RTRCTR WOUND ALEXIS 18CM MED (MISCELLANEOUS)
SCISSORS LAP 5X35 DISP (ENDOMECHANICALS) ×3 IMPLANT
SEALER TISSUE G2 CVD JAW 45CM (ENDOMECHANICALS) ×3 IMPLANT
SET IRRIG TUBING LAPAROSCOPIC (IRRIGATION / IRRIGATOR) ×3 IMPLANT
SHEARS HARMONIC ACE PLUS 36CM (ENDOMECHANICALS) ×3 IMPLANT
SOLUTION ANTI FOG 6CC (MISCELLANEOUS) ×3 IMPLANT
SPONGE GAUZE 4X4 12PLY (GAUZE/BANDAGES/DRESSINGS) ×3 IMPLANT
SPONGE LAP 18X18 X RAY DECT (DISPOSABLE) ×12 IMPLANT
STAPLER CIRC CVD 29MM 37CM (STAPLE) ×3 IMPLANT
STAPLER CUT CVD 40MM BLUE (STAPLE) ×3 IMPLANT
STAPLER PROXIMATE 75MM BLUE (STAPLE) ×3 IMPLANT
STAPLER VISISTAT 35W (STAPLE) ×3 IMPLANT
STRIP CLOSURE SKIN 1/2X4 (GAUZE/BANDAGES/DRESSINGS) IMPLANT
STRIP PERI DRY VERITAS 45 (STAPLE) IMPLANT
STRIP PERI DRY VERITAS 60 (STAPLE) IMPLANT
SUCTION POOLE TIP (SUCTIONS) ×6 IMPLANT
SUT PDS AB 1 CT1 27 (SUTURE) IMPLANT
SUT PDS AB 1 CTX 36 (SUTURE) IMPLANT
SUT PDS AB 4-0 SH 27 (SUTURE) IMPLANT
SUT PROLENE 2 0 CT2 30 (SUTURE) ×3 IMPLANT
SUT PROLENE 2 0 KS (SUTURE) IMPLANT
SUT SILK 2 0 (SUTURE) ×2
SUT SILK 2 0 SH CR/8 (SUTURE) ×6 IMPLANT
SUT SILK 2-0 18XBRD TIE 12 (SUTURE) ×1 IMPLANT
SUT SILK 3 0 (SUTURE) ×2
SUT SILK 3 0 SH CR/8 (SUTURE) ×3 IMPLANT
SUT SILK 3-0 18XBRD TIE 12 (SUTURE) ×1 IMPLANT
SUT VIC AB 2-0 CT1 27 (SUTURE)
SUT VIC AB 2-0 CT1 27XBRD (SUTURE) IMPLANT
SUT VIC AB 3-0 PS2 18 (SUTURE)
SUT VIC AB 3-0 PS2 18XBRD (SUTURE) IMPLANT
SUT VIC AB 4-0 SH 18 (SUTURE) IMPLANT
SUT VICRYL 0 UR6 27IN ABS (SUTURE) IMPLANT
SYR 30ML LL (SYRINGE) ×3 IMPLANT
SYR BULB IRRIGATION 50ML (SYRINGE) ×3 IMPLANT
TOWEL OR 17X26 10 PK STRL BLUE (TOWEL DISPOSABLE) ×6 IMPLANT
TRAY FOLEY CATH 14FRSI W/METER (CATHETERS) ×3 IMPLANT
TRAY LAP CHOLE (CUSTOM PROCEDURE TRAY) ×3 IMPLANT
TROCAR BLADELESS OPT 5 75 (ENDOMECHANICALS) ×9 IMPLANT
TROCAR XCEL 12X100 BLDLESS (ENDOMECHANICALS) ×3 IMPLANT
TUBING CONNECTING 10 (TUBING) IMPLANT
TUBING CONNECTING 10' (TUBING)
TUBING FILTER THERMOFLATOR (ELECTROSURGICAL) ×3 IMPLANT
WATER STERILE IRR 1000ML POUR (IV SOLUTION) IMPLANT
YANKAUER SUCT BULB TIP 10FT TU (MISCELLANEOUS) ×3 IMPLANT
YANKAUER SUCT BULB TIP NO VENT (SUCTIONS) ×3 IMPLANT

## 2013-06-03 NOTE — Op Note (Addendum)
06/03/2013  10:56 AM  PATIENT:  Hulen SkainsNancy K Cyphers  68 y.o. female  PRE-OPERATIVE DIAGNOSIS:  recurrent sigmoid diverticulitis   POST-OPERATIVE DIAGNOSIS:  RECURRENT SIGMOID DIVERTICULITIS   PROCEDURE:  Procedure(s): LAPAROSCOPY LYSIS OF ADHESIONS and OPEN LYSIS OF ADHESIONS (1.5 HRS)  SIGMOID COLON RESECTION  (N/A)  SURGEON:  Surgeon(s) and Role:    * Axel FillerArmando Cloyce Paterson, MD - Primary    * Adolph Pollackodd J Rosenbower, MD - Assisting  ANESTHESIA:   local and general  EBL:  Total I/O In: 2000 [I.V.:2000] Out: 450 [Urine:200; Blood:250]  BLOOD ADMINISTERED:none  DRAINS: (5919fr) Jackson-Pratt drain(s) with closed bulb suction in the pelvis   LOCAL MEDICATIONS USED:  BUPIVICAINE   SPECIMEN:  Source of Specimen:  Sigmoid colon  DISPOSITION OF SPECIMEN:  PATHOLOGY  COUNTS:  YES  TOURNIQUET:  * No tourniquets in log *  DICTATION: .Dragon Dictation The patient was taken back to the operating room and placed in the lithotomy position with bilateral SCDs in place. The patient was then prepped and draped in the usual sterile fashion. A timeout was called and all facts were verified.  At this time a Veress needle technique was used to insufflate the abdomen to 15 mm of mercury. This was done  stab wound incision in the right subcostal margin. Subsequent to this a 5 mm trocar  and camera were then placed, there is no injury to any intra-abdominal organ. This time there was a large amount of adhesions of the omentum to the anterior abdominal wall it could be seen. A subsequent final trochars in place the right lower quadrant under direct visualization. I then proceeded to take down the adhesions of the omentum to the anterior abdominal wall there was also small bowel adhesions to anterior abdominal wall however there was no injury to any small bowel.    this time I was able to visualize the left colon and sigmoid colon however there was a large amount of omental adhesions to both these. At this time I  proceeded to convert to exploratory laparotomy.  A 10 blade was used to make an incision over the previous midline incision, and electrocautery was used to maintain hemostasis dissection was taken down to the anterior fascia. The anterior fascia was incised the length of the incision. A self-retaining retractor was in place the wound. I then proceeded to lyse adhesions from the surrounding omentum to the left colon. The white line of Toldt was incised and the left colon was rotated medially. At this time the ureter was identified and pulled away from the mesentery of the left colon. There was a large amount of sensitive left lower quadrant, and pelvis. Most of these adhesions consisted of omental adhesions. This was lysed sharply and with electrocautery, this was approximately 1-1/2 hours into its  entirety. Once were able to elevate the omentum away from the surrounding sigmoid colon the sigmoid colon was identified. It was also small amount of small bowel adhesions to the right lateral portion of the sigmoid colon this was taken down sharply. Aside from this other small bowel small bowel adhesions were not lysed. At this time were able to get down to the sigmoid rectal junction. Distal to this the rectum was normal and we decided on a transection point. At this time a contour stapler was then used to transect the distal portion of the rectum. The position of the left colon was then picked in the mesentery was incised on the proximal portion of the left colon. This  was transected using a 75 GIA stapler. At this time the mesentery was then harvested using a harmonic device.  The distal margin of the specimen was marked with a 2-0 silk. This was sent off to pathology.  At this time the pelvis was examined for hemostasis which was obtained using electrocautery. I then proceeded to remove stable and off the proximal colon. A 2-0 Prolene was used to fasten a pursestring suture. A 29 sizer was placed into the colon  which was adequate. At this time the Prolene stitch was secured around the anvil of a 29 EEA stapler anvil. Dr. Kae Hellerosenbaum and proceed from the rectum and advanced the EEA stapler up the rectum to the staple line. At this time the anvil was connected the stapler. This was fired. This was withdrawn in its entirety. There was 2 donuts circumferentially intact. We then proceeded with a bubble test which was negative. At this time the area was irrigated out with sterile saline.  We changed to new gowns and gloves and the area was then redraped. We placed a 5119 [place] drain into the pelvis and this was brought out through the right lower quadrant trocar site. This was sutured to the skin using a 2-0 nylon in interrupted fashion. At this time the fascia was reapproximated using #1 PDS single-stranded in a running standard fashion. The subcutaneous fat was irrigated out. Skin was then stapled as were the trocar sites. The patient tolerated the procedure well was taken to the recovery room in stable condition.  PLAN OF CARE: Admit to inpatient   PATIENT DISPOSITION:  PACU - hemodynamically stable.   Delay start of Pharmacological VTE agent (>24hrs) due to surgical blood loss or risk of bleeding: not applicable

## 2013-06-03 NOTE — Transfer of Care (Signed)
Immediate Anesthesia Transfer of Care Note  Patient: Michelle Hubbard  Procedure(s) Performed: Procedure(s): LAPAROSCOPY DIAGNOSTIC and OPEN  SIGMOID COLON RESECTION  (N/A)  Patient Location: PACU  Anesthesia Type:General  Level of Consciousness: awake, alert  and oriented  Airway & Oxygen Therapy: Patient Spontanous Breathing and Patient connected to face mask oxygen  Post-op Assessment: Report given to PACU RN and Post -op Vital signs reviewed and stable  Post vital signs: Reviewed and stable  Complications: No apparent anesthesia complications

## 2013-06-03 NOTE — Anesthesia Postprocedure Evaluation (Signed)
  Anesthesia Post-op Note  Patient: Michelle Hubbard  Procedure(s) Performed: Procedure(s) (LRB): LAPAROSCOPY DIAGNOSTIC and OPEN  SIGMOID COLON RESECTION  (N/A)  Patient Location: PACU  Anesthesia Type: General  Level of Consciousness: awake and alert   Airway and Oxygen Therapy: Patient Spontanous Breathing  Post-op Pain: mild  Post-op Assessment: Post-op Vital signs reviewed, Patient's Cardiovascular Status Stable, Respiratory Function Stable, Patent Airway and No signs of Nausea or vomiting  Last Vitals:  Filed Vitals:   06/03/13 1512  BP:   Pulse:   Temp:   Resp: 13    Post-op Vital Signs: stable   Complications: No apparent anesthesia complications

## 2013-06-03 NOTE — Anesthesia Procedure Notes (Signed)
Procedure Name: Intubation Date/Time: 06/03/2013 7:34 AM Performed by: Leroy LibmanEARDON, Michelle Davidson L Patient Re-evaluated:Patient Re-evaluated prior to inductionOxygen Delivery Method: Circle system utilized Preoxygenation: Pre-oxygenation with 100% oxygen Intubation Type: IV induction Ventilation: Mask ventilation without difficulty and Oral airway inserted - appropriate to patient size Laryngoscope Size: Miller and 2 Grade View: Grade I Tube type: Oral Tube size: 7.5 mm Number of attempts: 1 Airway Equipment and Method: Stylet Secured at: 21 cm Tube secured with: Tape Dental Injury: Teeth and Oropharynx as per pre-operative assessment

## 2013-06-03 NOTE — H&P (Signed)
HPI  Michelle Hubbard is a 68 y.o. female. The patient is a 68 year old female who is referred by Dr. Philippa SicksBuscemi for evaluation of recurrent diverticulitis. This states she's had on and off episodes of diverticulitis pain for approximately 10 years. The patient mainly complains of periumbilical pain that radiates to the left and right lower quadrants. The patient states that her pain is very severe at times. The patient had a family history of colon cancer however recently underwent a colonoscopy in January 2014 which was negative except for severe sigmoid diverticular disease. The patient has been treated several occasions with by mouth antibiotics on an outpatient basis. She states the pain waxes and wanes every 2-3 days. She states that sometimes she has pain episodes for days to weeks at a time. It seems that she does not always take antibiotics prescribed for the diverticular pain at that time. She states the last she took antibiotics wasapproximately a year ago.  Of note the patient has had previous exploratory laparotomies secondary small bowel obstruction after having a hysterectomy in 86. Lysis of adhesions took place in 2000. According to the patient at that time her entire bowel was covered with adhesions.  HPI  Past Medical History   Diagnosis  Date   .  DEPRESSIVE DISORDER  12/23/2006   .  SINUSITIS- ACUTE-NOS  07/09/2007   .  LOW BACK PAIN  12/23/2006   .  HYPERLIPIDEMIA  01/08/2010   .  Other specified forms of hearing loss  01/08/2010   .  COPD  01/08/2010   .  OSTEOPOROSIS  01/08/2010   .  FATIGUE  01/08/2010   .  Vitamin D deficiency  04/30/2010   .  OAB (overactive bladder)  10/07/2011    Past Surgical History   Procedure  Laterality  Date   .  Breast biopsy   1986   .  Oophorectomy   bilat 1988     secondary to cyst   .  Abdominal hysterectomy      Family History   Problem  Relation  Age of Onset   .  Colon cancer  Mother    .  Colon cancer  Brother    .  Diabetes  Maternal  Aunt    .  Diabetes  Maternal Grandmother    Social History  History   Substance Use Topics   .  Smoking status:  Current Every Day Smoker   .  Smokeless tobacco:  Not on file   .  Alcohol Use:  Yes    Allergies   Allergen  Reactions   .  Codeine      REACTION: nausea   .  Penicillins     Current Outpatient Prescriptions   Medication  Sig  Dispense  Refill   .  buPROPion (WELLBUTRIN SR) 150 MG 12 hr tablet  Take 1 tablet (150 mg total) by mouth 2 (two) times daily.  60 tablet  11   .  fluticasone (FLONASE) 50 MCG/ACT nasal spray  Place 2 sprays into the nose daily.  16 g  4   .  mirabegron ER (MYRBETRIQ) 25 MG TB24  Take 25 mg by mouth daily.     Marland Kitchen.  omeprazole (PRILOSEC) 20 MG capsule  Take 1 capsule (20 mg total) by mouth daily.  90 capsule  3   .  temazepam (RESTORIL) 15 MG capsule  1-2 tabs by mouth at bedtime as needed for sleep  60 capsule  3  No current facility-administered medications for this visit.   Review of Systems  Review of Systems  Constitutional: Negative.  HENT: Negative.  Respiratory: Negative.  Cardiovascular: Negative.  Gastrointestinal: Positive for abdominal pain, constipation, anal bleeding and rectal pain.  Neurological: Negative.  All other systems reviewed and are negative.  Blood pressure 132/78, pulse 68, temperature 98.4 F (36.9 C), temperature source Oral, resp. rate 14, height 5\' 7"  (1.702 m), weight 144 lb (65.318 kg).  Physical Exam  Physical Exam  Constitutional: She is oriented to person, place, and time. She appears well-developed and well-nourished.  HENT:  Head: Normocephalic and atraumatic.  Eyes: Conjunctivae and EOM are normal. Pupils are equal, round, and reactive to light.  Neck: Normal range of motion. Neck supple.  Cardiovascular: Normal rate, regular rhythm and normal heart sounds.  Pulmonary/Chest: Effort normal and breath sounds normal.  Abdominal: Soft. Bowel sounds are normal. She exhibits no distension and no mass.  There is no tenderness. There is no rebound and no guarding.    Musculoskeletal: Normal range of motion.  Neurological: She is alert and oriented to person, place, and time.  Skin: Skin is warm and dry.  Psychiatric: She has a normal mood and affect.  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 We'll proceed to the operating room for diagnostic laparoscopy, lysis of adhesions, laparoscopic versus open sigmoidectomy.  2. I discussed with the patient the possible risks and benefits of the procedure to include but not limited to: Infection, bleeding, damage to surrounding structures, possible need for postoperative ileostomy/colostomy, and possible need for further surgery. The patient voiced understanding and wishes to proceed.

## 2013-06-04 LAB — CBC
HCT: 35.1 % — ABNORMAL LOW (ref 36.0–46.0)
Hemoglobin: 11.4 g/dL — ABNORMAL LOW (ref 12.0–15.0)
MCH: 30.2 pg (ref 26.0–34.0)
MCHC: 32.5 g/dL (ref 30.0–36.0)
MCV: 93.1 fL (ref 78.0–100.0)
Platelets: 350 10*3/uL (ref 150–400)
RBC: 3.77 MIL/uL — ABNORMAL LOW (ref 3.87–5.11)
RDW: 13.7 % (ref 11.5–15.5)
WBC: 11.2 10*3/uL — ABNORMAL HIGH (ref 4.0–10.5)

## 2013-06-04 LAB — BASIC METABOLIC PANEL
BUN: 10 mg/dL (ref 6–23)
CO2: 29 mEq/L (ref 19–32)
Calcium: 8.6 mg/dL (ref 8.4–10.5)
Chloride: 104 mEq/L (ref 96–112)
Creatinine, Ser: 0.62 mg/dL (ref 0.50–1.10)
GFR calc Af Amer: >90 mL/min (ref >90)
GFR calc non Af Amer: 90 mL/min — ABNORMAL LOW (ref >90)
Glucose, Bld: 113 mg/dL — ABNORMAL HIGH (ref 70–99)
Potassium: 4.4 mEq/L (ref 3.7–5.3)
Sodium: 140 mEq/L (ref 137–147)

## 2013-06-04 MED ORDER — PROMETHAZINE HCL 25 MG/ML IJ SOLN
12.5000 mg | Freq: Three times a day (TID) | INTRAMUSCULAR | Status: DC | PRN
  Administered 2013-06-05 – 2013-06-08 (×4): 12.5 mg via INTRAVENOUS
  Filled 2013-06-04 (×5): qty 1

## 2013-06-04 MED ORDER — KETOROLAC TROMETHAMINE 30 MG/ML IJ SOLN
30.0000 mg | Freq: Three times a day (TID) | INTRAMUSCULAR | Status: AC
  Administered 2013-06-04 – 2013-06-05 (×6): 30 mg via INTRAVENOUS
  Filled 2013-06-04: qty 2
  Filled 2013-06-04: qty 1
  Filled 2013-06-04: qty 2
  Filled 2013-06-04 (×5): qty 1

## 2013-06-04 NOTE — Evaluation (Signed)
Physical Therapy Evaluation Patient Details Name: Michelle Hubbard MRN: [id number] DOB: Jul 05, 1945 Today's Date: 06/04/2013   History of Present Illness    LAPAROSCOPY LYSIS OF ADHESIONS and OPEN LYSIS OF ADHESIONS (1.5 HRS) SIGMOID COLON RESECTION    Clinical Impression  Pt is pleasant and motivated to regain IND with functional mobility limited by abdominal discomfort and decreased enfurance. At this time pt requires min assist for mobility including ambulation with RW.  Pt should progress well to d/c home with family assist    Follow Up Recommendations No PT follow up    Equipment Recommendations  Rolling walker with 5" wheels    Recommendations for Other Services OT consult     Precautions / Restrictions Precautions Precautions: Fall;Other (comment) Precaution Comments: Abdominal surgery - drain in place Restrictions Weight Bearing Restrictions: No      Mobility  Bed Mobility Overal bed mobility: Needs Assistance Bed Mobility: Rolling;Supine to Sit Rolling: Min guard   Supine to sit: Min guard;HOB elevated     General bed mobility comments: cues for sequencing  Transfers Overall transfer level: Needs assistance Equipment used: Rolling walker (2 wheeled) Transfers: Sit to/from Stand Sit to Stand: Min assist         General transfer comment: cues for use of UEs to self assist  Ambulation/Gait Ambulation/Gait assistance: Min assist Ambulation Distance (Feet): 55 Feet Assistive device: Rolling walker (2 wheeled) Gait Pattern/deviations: Step-to pattern;Step-through pattern;Decreased step length - right;Decreased step length - left;Shuffle;Trunk flexed Gait velocity: decr   General Gait Details: cues for posture and position from RW - ltd by increased abdominal discomfort - RN aware  Stairs            Wheelchair Mobility    Modified Rankin (Stroke Patients Only)       Balance                                              Pertinent Vitals/Pain 5/10; PCA utilized during session    Home Living Family/patient expects to be discharged to:: Private residence Living Arrangements: Alone Available Help at Discharge: Family Type of Home: House Home Access: Stairs to [employer];Level entry Entrance Stairs-Rails: Left Entrance Stairs-Number of Steps: 1 Home Layout: One level Home Equipment: None      Prior Function Level of Independence: Independent               Hand Dominance   Dominant Hand: Right    Extremity/Trunk Assessment   Upper Extremity Assessment: Overall WFL for tasks assessed           Lower Extremity Assessment: Overall WFL for tasks assessed      Cervical / Trunk Assessment: Normal  Communication   Communication: No difficulties  Cognition Arousal/Alertness: Awake/alert Behavior During Therapy: WFL for tasks assessed/performed Overall Cognitive Status: Within Functional Limits for tasks assessed                      General Comments      Exercises        Assessment/Plan    PT Assessment Patient needs continued PT services  PT Diagnosis Difficulty walking   PT Problem List Decreased strength;Decreased range of motion;Decreased activity tolerance;Decreased mobility;Decreased knowledge of use of DME;Pain  PT Treatment Interventions DME instruction;Gait training;Stair training;Functional mobility training;Therapeutic activities;Patient/family education   PT Goals (Current goals can be found in  the Care Plan section) Acute Rehab PT Goals Patient Stated Goal: Resume previous lifestyle asap PT Goal Formulation: With patient Time For Goal Achievement: 06/18/13 Potential to Achieve Goals: Good    Frequency Min 3X/week   Barriers to discharge        Co-evaluation               End of Session   Activity Tolerance: Patient tolerated treatment well;Patient limited by pain Patient left: in chair;with call bell/phone within reach Nurse  Communication: Mobility status         Time: 0830-0850 PT Time Calculation (min): 20 min   Charges:   PT Evaluation $Initial PT Evaluation Tier I: 1 Procedure PT Treatments $Gait Training: 8-22 mins   PT G Codes:          Brien FewHunter P Masson Nalepa 06/04/2013, 12:57 PM

## 2013-06-04 NOTE — Progress Notes (Signed)
1 Day Post-Op  Subjective: Complains of abdominal pain and nausea  Objective: Vital signs in last 24 hours: Temp:  [97.5 F (36.4 C)-99.2 F (37.3 C)] 99.2 F (37.3 C) (05/09 0554) Pulse Rate:  [66-96] 70 (05/09 0554) Resp:  [13-19] 14 (05/09 0619) BP: (105-138)/(61-79) 110/62 mmHg (05/09 0554) SpO2:  [95 %-100 %] 96 % (05/09 0619) FiO2 (%):  [39 %-51 %] 51 % (05/09 0619) Weight:  [145 lb 8.1 oz (66 kg)] 145 lb 8.1 oz (66 kg) (05/08 1216) Last BM Date: 06/02/13 (PTA)  Intake/Output from previous day: 05/08 0701 - 05/09 0700 In: 4533.3 [I.V.:4533.3] Out: 2620 [Urine:2200; Drains:170; Blood:250] Intake/Output this shift:    Resp: clear to auscultation bilaterally Cardio: regular rate and rhythm GI: soft, appropriately tender. quiet. incision is ok  Lab Results:   Recent Labs  06/03/13 1240 06/04/13 0523  WBC 15.7* 11.2*  HGB 13.0 11.4*  HCT 39.7 35.1*  PLT 356 350   BMET  Recent Labs  06/03/13 1240 06/04/13 0523  NA  --  140  K  --  4.4  CL  --  104  CO2  --  29  GLUCOSE  --  113*  BUN  --  10  CREATININE 0.72 0.62  CALCIUM  --  8.6   PT/INR No results found for this basename: LABPROT, INR,  in the last 72 hours ABG No results found for this basename: PHART, PCO2, PO2, HCO3,  in the last 72 hours  Studies/Results: No results found.  Anti-infectives: Anti-infectives   Start     Dose/Rate Route Frequency Ordered Stop   06/03/13 1400  clindamycin (CLEOCIN) IVPB 900 mg     900 mg 100 mL/hr over 30 Minutes Intravenous 3 times per day 06/03/13 1223 06/03/13 1405   06/03/13 0730  ciprofloxacin (CIPRO) IVPB 400 mg     400 mg 200 mL/hr over 60 Minutes Intravenous  Once 06/03/13 0722 06/03/13 0806   06/03/13 0730  metroNIDAZOLE (FLAGYL) IVPB 500 mg     500 mg 100 mL/hr over 60 Minutes Intravenous  Once 06/03/13 0722 06/03/13 0826      Assessment/Plan: s/p Procedure(s): LAPAROSCOPY DIAGNOSTIC and OPEN  SIGMOID COLON RESECTION  (N/A) Continue PCA.  Add toradol for pain control Will give phenergan for nausea but if she vomits then she may need ng OOB  LOS: 1 day    Caleen Essexaul S Toth III 06/04/2013

## 2013-06-05 MED ORDER — BIOTENE DRY MOUTH MT LIQD
15.0000 mL | Freq: Two times a day (BID) | OROMUCOSAL | Status: DC
  Administered 2013-06-06 (×2): 15 mL via OROMUCOSAL

## 2013-06-05 MED ORDER — CHLORHEXIDINE GLUCONATE 0.12 % MT SOLN
15.0000 mL | Freq: Two times a day (BID) | OROMUCOSAL | Status: DC
Start: 2013-06-05 — End: 2013-06-07
  Administered 2013-06-05 – 2013-06-06 (×3): 15 mL via OROMUCOSAL
  Filled 2013-06-05 (×5): qty 15

## 2013-06-05 NOTE — Progress Notes (Signed)
Physical Therapy Treatment Patient Details Name: Hulen Skainsancy K Covault MRN: [id number] DOB: 05-13-1945 Today's Date: 06/05/2013    History of Present Illness      PT Comments    POD # 2 pt having difficulty progressing mobility due to MAX c/o nausea with activity along with active vomiting.  Follow Up Recommendations  No PT follow up     Equipment Recommendations  Rolling walker with 5" wheels    Recommendations for Other Services       Precautions / Restrictions Precautions Precaution Comments: Abdominal surgery - drain in place Restrictions Weight Bearing Restrictions: No    Mobility  Bed Mobility               General bed mobility comments: Pt OOB in recliner   Transfers Overall transfer level: Needs assistance Equipment used: Rolling walker (2 wheeled) Transfers: Sit to/from Stand Sit to Stand: Min guard;Min assist         General transfer comment: cues for use of UEs to self assist  Ambulation/Gait Ambulation/Gait assistance: Min guard;Min assist Ambulation Distance (Feet): 50 Feet Assistive device: Rolling walker (2 wheeled) Gait Pattern/deviations: Step-to pattern;Trunk flexed Gait velocity: decr   General Gait Details: limited amb distance due to ABD discomfort and c/o nausea with activity   Stairs            Wheelchair Mobility    Modified Rankin (Stroke Patients Only)       Balance                                    Cognition                            Exercises      General Comments        Pertinent Vitals/Pain C/o ABD "tightness/soreness" Used PCA x 2    Home Living                      Prior Function            PT Goals (current goals can now be found in the care plan section) Progress towards PT goals: Progressing toward goals    Frequency  Min 3X/week    PT Plan      Co-evaluation             End of Session Equipment Utilized During Treatment: Gait  belt Activity Tolerance: Other (comment) (nausea) Patient left: in chair     Time: 0822-0846 PT Time Calculation (min): 24 min  Charges:  $Gait Training: 8-22 mins $Therapeutic Activity: 8-22 mins                    G Codes:      Felecia ShellingLori Omer Monter  PTA [facility]  Acute  Rehab Pager      [phone]

## 2013-06-05 NOTE — Progress Notes (Signed)
Patient ID: Michelle Hubbard, female   DOB: 26-Apr-1945, 68 y.o.   MRN: [id number] [facility] Surgery Progress Note:   2 Days Post-Op  Subjective: Mental status is clear.  Sitting up in chair and discussing the nausea that she gets when she walks.  Not related to PCA Objective: Vital signs in last 24 hours: Temp:  [98.1 F (36.7 C)-98.3 F (36.8 C)] 98.1 F (36.7 C) (05/10 0520) Pulse Rate:  [71-88] 88 (05/10 0520) Resp:  [12-20] 18 (05/10 0838) BP: (121-124)/(61-68) 121/68 mmHg (05/10 0520) SpO2:  [91 %-98 %] 94 % (05/10 0838) FiO2 (%):  [49 %-50 %] 50 % (05/10 0838)  Intake/Output from previous day: 05/09 0701 - 05/10 0700 In: 2421.7 [I.V.:2421.7] Out: 695 [Urine:690; Drains:5] Intake/Output this shift:    Physical Exam: Work of breathing is normal.  Feels rumbling but no flatus yet.    Lab Results:  Results for orders placed during the hospital encounter of 06/03/13 (from the past 48 hour(s))  CBC     Status: Abnormal   Collection Time    06/03/13 12:40 PM      Result Value Ref Range   WBC 15.7 (*) 4.0 - 10.5 K/uL   RBC 4.28  3.87 - 5.11 MIL/uL   Hemoglobin 13.0  12.0 - 15.0 g/dL   HCT 39.7  36.0 - 46.0 %   MCV 92.8  78.0 - 100.0 fL   MCH 30.4  26.0 - 34.0 pg   MCHC 32.7  30.0 - 36.0 g/dL   RDW 13.7  11.5 - 15.5 %   Platelets 356  150 - 400 K/uL  CREATININE, SERUM     Status: Abnormal   Collection Time    06/03/13 12:40 PM      Result Value Ref Range   Creatinine, Ser 0.72  0.50 - 1.10 mg/dL   GFR calc non Af Amer 86 (*) >90 mL/min   GFR calc Af Amer >90  >90 mL/min   Comment: (NOTE)     The eGFR has been calculated using the CKD EPI equation.     This calculation has not been validated in all clinical situations.     eGFR's persistently <90 mL/min signify possible Chronic Kidney     Disease.  BASIC METABOLIC PANEL     Status: Abnormal   Collection Time    06/04/13  5:23 AM      Result Value Ref Range   Sodium 140  137 - 147 mEq/L   Potassium 4.4  3.7 -  5.3 mEq/L   Chloride 104  96 - 112 mEq/L   CO2 29  19 - 32 mEq/L   Glucose, Bld 113 (*) 70 - 99 mg/dL   BUN 10  6 - 23 mg/dL   Creatinine, Ser 0.62  0.50 - 1.10 mg/dL   Calcium 8.6  8.4 - 10.5 mg/dL   GFR calc non Af Amer 90 (*) >90 mL/min   GFR calc Af Amer >90  >90 mL/min   Comment: (NOTE)     The eGFR has been calculated using the CKD EPI equation.     This calculation has not been validated in all clinical situations.     eGFR's persistently <90 mL/min signify possible Chronic Kidney     Disease.  CBC     Status: Abnormal   Collection Time    06/04/13  5:23 AM      Result Value Ref Range   WBC 11.2 (*) 4.0 - 10.5 K/uL  RBC 3.77 (*) 3.87 - 5.11 MIL/uL   Hemoglobin 11.4 (*) 12.0 - 15.0 g/dL   HCT 35.1 (*) 36.0 - 46.0 %   MCV 93.1  78.0 - 100.0 fL   MCH 30.2  26.0 - 34.0 pg   MCHC 32.5  30.0 - 36.0 g/dL   RDW 13.7  11.5 - 15.5 %   Platelets 350  150 - 400 K/uL    Radiology/Results: No results found.  Anti-infectives: Anti-infectives   Start     Dose/Rate Route Frequency Ordered Stop   06/03/13 1400  clindamycin (CLEOCIN) IVPB 900 mg     900 mg 100 mL/hr over 30 Minutes Intravenous 3 times per day 06/03/13 1223 06/03/13 1405   06/03/13 0730  ciprofloxacin (CIPRO) IVPB 400 mg     400 mg 200 mL/hr over 60 Minutes Intravenous  Once 06/03/13 0722 06/03/13 0806   06/03/13 0730  metroNIDAZOLE (FLAGYL) IVPB 500 mg     500 mg 100 mL/hr over 60 Minutes Intravenous  Once 06/03/13 7741 06/03/13 2878      Assessment/Plan: Problem List: Patient Active Problem List   Diagnosis Date Noted  . 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  . OAB (overactive bladder) 10/07/2011  . Preventative health care 10/07/2011  . Smoker 10/07/2011  . Chronic insomnia 08/15/2011  . Vitamin d deficiency 04/30/2010  . HYPERLIPIDEMIA 01/08/2010  . 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 COPD 01/08/2010  . OSTEOPOROSIS 01/08/2010  . FATIGUE 01/08/2010  . DEPRESSIVE DISORDER  12/23/2006  . LOW BACK PAIN 12/23/2006    Ileus post colectomy.  Continue observation before beginning PO 2 Days Post-Op    LOS: 2 days   Matt B. Hassell Done, MD, [facility] [phone] beeper [phone]  06/05/2013 9:48 AM

## 2013-06-06 ENCOUNTER — Encounter (HOSPITAL_COMMUNITY): Payer: Self-pay | Admitting: General Surgery

## 2013-06-06 MED ORDER — OXYCODONE-ACETAMINOPHEN 5-325 MG PO TABS
1.0000 | ORAL_TABLET | ORAL | Status: DC | PRN
  Administered 2013-06-06 – 2013-06-07 (×4): 1 via ORAL
  Administered 2013-06-07 – 2013-06-08 (×5): 2 via ORAL
  Filled 2013-06-06: qty 1
  Filled 2013-06-06: qty 2
  Filled 2013-06-06: qty 1
  Filled 2013-06-06: qty 2
  Filled 2013-06-06: qty 1
  Filled 2013-06-06: qty 2
  Filled 2013-06-06: qty 1
  Filled 2013-06-06 (×2): qty 2

## 2013-06-06 NOTE — Progress Notes (Signed)
3 Days Post-Op  Subjective: PT doing well.  Passing flatus.  Walking in hall  Objective: Vital signs in last 24 hours: Temp:  [98.3 F (36.8 C)-98.5 F (36.9 C)] 98.5 F (36.9 C) (05/11 0600) Pulse Rate:  [67-70] 69 (05/11 0600) Resp:  [2-20] 2 (05/11 1142) BP: (153-166)/(74-75) 156/75 mmHg (05/11 0600) SpO2:  [94 %-100 %] 99 % (05/11 0752) Last BM Date: 06/02/13 (PTA)  Intake/Output from previous day: 05/10 0701 - 05/11 0700 In: 2380 [P.O.:30; I.V.:2350] Out: 740 [Urine:725; Drains:15] Intake/Output this shift:    General appearance: alert and cooperative Cardio: regular rate and rhythm, S1, S2 normal, no murmur, click, rub or gallop GI: s/nd/ active BS wond c/d/i  Lab Results:   Anti-infectives:  Assessment/Plan: s/p Procedure(s): LAPAROSCOPY DIAGNOSTIC and OPEN  SIGMOID COLON RESECTION  (N/A) Advance diet Ambulate Await good bowel function  LOS: 3 days    Michelle Hubbard Michelle Hubbard 06/06/2013

## 2013-06-07 ENCOUNTER — Encounter: Payer: Medicare Other | Admitting: Internal Medicine

## 2013-06-07 NOTE — Progress Notes (Signed)
PT Cancellation Note  ___Treatment cancelled today due to medical issues with patient which prohibited therapy  ___ Treatment cancelled today due to patient receiving procedure or test   ___ Treatment cancelled today due to patient's refusal to participate   _X_ Treatment cancelled today due to pt amb self in hallway holding IV pole and in/around room.  Felecia ShellingLori Evertt Chouinard  PTA [facility]  Acute  Rehab Pager      [phone]

## 2013-06-07 NOTE — Progress Notes (Signed)
4 Days Post-Op  Subjective: Pt doing well today.  Tol CLD.  COn't with flatus.  No BM  Objective: Vital signs in last 24 hours: Temp:  [98 F (36.7 C)-98.4 F (36.9 C)] 98.4 F (36.9 C) (05/12 0459) Pulse Rate:  [63-77] 67 (05/12 0459) Resp:  [2-18] 18 (05/12 0459) BP: (130-147)/(69-78) 130/69 mmHg (05/12 0459) SpO2:  [93 %-99 %] 95 % (05/12 0459) Last BM Date: 06/02/13 (PTA)  Intake/Output from previous day: 05/11 0701 - 05/12 0700 In: 2760 [P.O.:360; I.V.:2400] Out: 1620 [Urine:1600; Drains:20] Intake/Output this shift:    General appearance: alert and cooperative Resp: clear to auscultation bilaterally Cardio: regular rate and rhythm, S1, S2 normal, no murmur, click, rub or gallop GI: soft, non-tender; bowel sounds normal; no masses,  no organomegaly wound c/d/i  Anti-infectives: Anti-infectives   Start     Dose/Rate Route Frequency Ordered Stop   06/03/13 1400  clindamycin (CLEOCIN) IVPB 900 mg     900 mg 100 mL/hr over 30 Minutes Intravenous 3 times per day 06/03/13 1223 06/03/13 1405   06/03/13 0730  ciprofloxacin (CIPRO) IVPB 400 mg     400 mg 200 mL/hr over 60 Minutes Intravenous  Once 06/03/13 0722 06/03/13 0806   06/03/13 0730  metroNIDAZOLE (FLAGYL) IVPB 500 mg     500 mg 100 mL/hr over 60 Minutes Intravenous  Once 06/03/13 0722 06/03/13 0826      Assessment/Plan: s/p Procedure(s): LAPAROSCOPY DIAGNOSTIC and OPEN  SIGMOID COLON RESECTION  (N/A) Advance diet Ambulate Await bowel function Wean O2   LOS: 4 days    Axel FillerArmando Madai Nuccio 06/07/2013

## 2013-06-08 MED ORDER — METOCLOPRAMIDE HCL 5 MG/ML IJ SOLN
10.0000 mg | Freq: Four times a day (QID) | INTRAMUSCULAR | Status: DC
  Administered 2013-06-08 – 2013-06-09 (×5): 10 mg via INTRAVENOUS
  Filled 2013-06-08 (×9): qty 2

## 2013-06-08 MED ORDER — ONDANSETRON 4 MG PO TBDP
4.0000 mg | ORAL_TABLET | ORAL | Status: DC | PRN
  Administered 2013-06-09: 4 mg via ORAL
  Filled 2013-06-08: qty 1

## 2013-06-08 NOTE — Progress Notes (Signed)
Physical Therapy Discharge Patient Details Name: TRANA RESSLER MRN: [id number] DOB: 1945/12/25 Today's Date: 06/08/2013 Time:  -     Patient discharged from PT services secondary to goals met and no further PT needs identified.  Please see latest therapy progress note for current level of functioning and progress toward goals.    Progress and discharge plan discussed with patient and/or caregiver: Patient is agreeable, observed pt up ad lib this AM. GP     Marcelino Freestone PT [phone]  06/08/2013, 8:16 AM

## 2013-06-08 NOTE — Care Management Note (Addendum)
    Page 1 of 1   06/09/2013     11:36:54 AM CARE MANAGEMENT NOTE 06/09/2013  Patient:  Michelle Hubbard,Michelle Hubbard   Account Number:  [id number]  Date Initiated:  06/08/2013  Documentation initiated by:  Lorenda IshiharaPEELE,Anzleigh Slaven  Subjective/Objective Assessment:   68 yo female admitted s/p colectomy. PTA lived at home alone.     Action/Plan:   Home when stable   Anticipated DC Date:  06/08/2013   Anticipated DC Plan:  HOME/SELF CARE      DC Planning Services  CM consult      Choice offered to / List presented to:             Status of service:  Completed, signed off Medicare Important Message given?   (If response is "NO", the following Medicare IM given date fields will be blank) Date Medicare IM given:   Date Additional Medicare IM given:  06/09/2013  Discharge Disposition:  HOME/SELF CARE  Per UR Regulation:  Reviewed for med. necessity/level of care/duration of stay  If discussed at Long Length of Stay Meetings, dates discussed:    Comments:

## 2013-06-08 NOTE — Progress Notes (Signed)
5 Days Post-Op  Subjective: Pt doing well.  Tol PO.  Ambulating well in the hall on her own.  Pt with +BM.     Objective: Vital signs in last 24 hours: Temp:  [98 F (36.7 C)-98.1 F (36.7 C)] 98.1 F (36.7 C) (05/13 0545) Pulse Rate:  [61-70] 61 (05/13 0545) Resp:  [18-20] 18 (05/13 0545) BP: (138-165)/(78-82) 165/82 mmHg (05/13 0545) SpO2:  [92 %-97 %] 92 % (05/13 0545) Last BM Date: 06/03/13  Intake/Output from previous day: 05/12 0701 - 05/13 0700 In: 3080 [P.O.:1080; I.V.:2000] Out: 3000 [Urine:2950; Drains:50] Intake/Output this shift:    General appearance: alert and cooperative Resp: clear to auscultation bilaterally Cardio: regular rate and rhythm, S1, S2 normal, no murmur, click, rub or gallop GI: s/nt/nd active BS   Anti-infectives: Anti-infectives   Start     Dose/Rate Route Frequency Ordered Stop   06/03/13 1400  clindamycin (CLEOCIN) IVPB 900 mg     900 mg 100 mL/hr over 30 Minutes Intravenous 3 times per day 06/03/13 1223 06/03/13 1405   06/03/13 0730  ciprofloxacin (CIPRO) IVPB 400 mg     400 mg 200 mL/hr over 60 Minutes Intravenous  Once 06/03/13 0722 06/03/13 0806   06/03/13 0730  metroNIDAZOLE (FLAGYL) IVPB 500 mg     500 mg 100 mL/hr over 60 Minutes Intravenous  Once 06/03/13 0722 06/03/13 0826      Assessment/Plan: s/p Procedure(s): LAPAROSCOPY DIAGNOSTIC and OPEN  SIGMOID COLON RESECTION  (N/A) Advance diet Con't ambulation Reglan for nausea Home in next 1-2d  LOS: 5 days    Axel Fillerrmando Siomara Burkel 06/08/2013

## 2013-06-08 NOTE — Progress Notes (Signed)
Pt walked approx 100 feet in hallway.  Pt states she felt dizzy when walking.  Instructed pt not to get oob w/o assistance.  Offered pt a walker when oob

## 2013-06-08 NOTE — Discharge Instructions (Signed)
[facility]      [facility] [phone]  OPEN ABDOMINAL SURGERY: POST OP INSTRUCTIONS  Always review your discharge instruction sheet given to you by the facility where your surgery was performed.  IF YOU HAVE DISABILITY OR FAMILY LEAVE FORMS, YOU MUST BRING THEM TO THE OFFICE FOR PROCESSING.  PLEASE DO NOT GIVE THEM TO YOUR DOCTOR.  1. A prescription for pain medication may be given to you upon discharge.  Take your pain medication as prescribed, if needed.  If narcotic pain medicine is not needed, then you may take acetaminophen (Tylenol) or ibuprofen (Advil) as needed. 2. Take your usually prescribed medications unless otherwise directed. 3. If you need a refill on your pain medication, please contact your pharmacy. They will contact our office to request authorization.  Prescriptions will not be filled after 5pm or on week-ends. 4. You should follow a light diet the first few days after arrival home, such as soup and crackers, pudding, etc.unless your doctor has advised otherwise. A high-fiber, low fat diet can be resumed as tolerated.   Be sure to include lots of fluids daily. Most patients will experience some swelling and bruising on the chest and neck area.  Ice packs will help.  Swelling and bruising can take several days to resolve 5. Most patients will experience some swelling and bruising in the area of the incision. Ice pack will help. Swelling and bruising can take several days to resolve..  6. It is common to experience some constipation if taking pain medication after surgery.  Increasing fluid intake and taking a stool softener will usually help or prevent this problem from occurring.  A mild laxative (Milk of Magnesia or Miralax) should be taken according to package directions if there are no bowel movements after 48 hours. 7.  You may have steri-strips (small skin tapes) in place directly over the incision.  These strips should be left on the skin for 7-10 days.  If your  surgeon used skin glue on the incision, you may shower in 24 hours.  The glue will flake off over the next 2-3 weeks.  Any sutures or staples will be removed at the office during your follow-up visit. You may find that a light gauze bandage over your incision may keep your staples from being rubbed or pulled. You may shower and replace the bandage daily. 8. ACTIVITIES:  You may resume regular (light) daily activities beginning the next day--such as daily self-care, walking, climbing stairs--gradually increasing activities as tolerated.  You may have sexual intercourse when it is comfortable.  Refrain from any heavy lifting or straining until approved by your doctor. a. You may drive when you no longer are taking prescription pain medication, you can comfortably wear a seatbelt, and you can safely maneuver your car and apply brakes b. Return to Work: ___________________________________ 9. You should see your doctor in the office for a follow-up appointment approximately two weeks after your surgery.  Make sure that you call for this appointment within a day or two after you arrive home to insure a convenient appointment time. OTHER INSTRUCTIONS:  _____________________________________________________________ _____________________________________________________________  WHEN TO CALL YOUR DOCTOR: 1. Fever over 101.0 2. Inability to urinate 3. Nausea and/or vomiting 4. Extreme swelling or bruising 5. Continued bleeding from incision. 6. Increased pain, redness, or drainage from the incision. 7. Difficulty swallowing or breathing 8. Muscle cramping or spasms. 9. Numbness or tingling in hands or feet or around lips.  The clinic staff is available to   answer your questions during regular business hours.  Please don't hesitate to call and ask to speak to one of the nurses if you have concerns.  For further questions, please visit [email or web address]   

## 2013-06-09 MED ORDER — TRAMADOL HCL 50 MG PO TABS
50.0000 mg | ORAL_TABLET | Freq: Four times a day (QID) | ORAL | Status: DC | PRN
  Administered 2013-06-09: 50 mg via ORAL
  Filled 2013-06-09: qty 1

## 2013-06-09 MED ORDER — TRAMADOL HCL 50 MG PO TABS: 50.0000 mg | ORAL_TABLET | Freq: Four times a day (QID) | ORAL | Status: DC | PRN

## 2013-06-09 NOTE — Progress Notes (Signed)
6 Days Post-Op  Subjective: PT doing well.  Feels much better today.  States PO is being tolerated.  Objective: Vital signs in last 24 hours: Temp:  [98 F (36.7 C)-98.3 F (36.8 C)] 98.3 F (36.8 C) (05/14 0559) Pulse Rate:  [56-63] 63 (05/14 0559) Resp:  [18-20] 18 (05/14 0559) BP: (139-143)/(64-73) 139/73 mmHg (05/14 0559) SpO2:  [90 %-93 %] 93 % (05/14 0559) Last BM Date: 06/08/13  Intake/Output from previous day: 05/13 0701 - 05/14 0700 In: 2736.7 [P.O.:360; I.V.:2376.7] Out: 1635 [Urine:1600; Drains:35] Intake/Output this shift:    General appearance: alert and cooperative Cardio: regular rate and rhythm, S1, S2 normal, no murmur, click, rub or gallop GI: soft, non-tender; bowel sounds normal; no masses,  no organomegaly wound c/d/i   Assessment/Plan: s/p Procedure(s): LAPAROSCOPY DIAGNOSTIC and OPEN  SIGMOID COLON RESECTION  (N/A) Will plan DC today around lunch time if pt con't to do well and tol PO Will f/u in 2 weeks   LOS: 6 days    Michelle Hubbard Michelle Hubbard 06/09/2013

## 2013-06-10 ENCOUNTER — Telehealth (INDEPENDENT_AMBULATORY_CARE_PROVIDER_SITE_OTHER): Payer: Self-pay | Admitting: General Surgery

## 2013-06-10 ENCOUNTER — Encounter (INDEPENDENT_AMBULATORY_CARE_PROVIDER_SITE_OTHER): Payer: Self-pay | Admitting: General Surgery

## 2013-06-10 ENCOUNTER — Telehealth (INDEPENDENT_AMBULATORY_CARE_PROVIDER_SITE_OTHER): Payer: Self-pay

## 2013-06-10 NOTE — Discharge Summary (Signed)
Physician Discharge Summary  Patient ID: Michelle Hubbard MRN: [id number] DOB/AGE: 04/30/1945 68 y.o.  Admit date: 06/03/2013 Discharge date: 06/10/2013  Admission Diagnoses: status post sigmoid colectomy  Discharge Diagnoses:  Active Problems:   S/P partial resection of colon   Discharged Condition: good  Hospital Course: the patient was status post sigmoid colectomy. The patient was admitted to the floor postoperatively. The patient was slowly advanced to a clear liquid diet and then to a regular diet as tolerated. Patient was having bowel function and good pain control. Patient was otherwise afebrile  Was ambulated on her own, as detailed for discharge and discharged home.  Consults: None  Significant Diagnostic Studies: none  Treatments: surgery: as above  Discharge Exam: Blood pressure 139/73, pulse 63, temperature 98.3 F (36.8 C), temperature source Oral, resp. rate 18, height 5\' 6"  (1.676 m), weight 145 lb 8.1 oz (66 kg), SpO2 93.00%. General appearance: alert and cooperative Resp: clear to auscultation bilaterally Cardio: regular rate and rhythm, S1, S2 normal, no murmur, click, rub or gallop GI: soft, non-tender; bowel sounds normal; no masses,  no organomegalywound is clean dry and intact, JP draining serosanguineous prior to removal.  Disposition: 01-Home or Self Care     Medication List    STOP taking these medications       ciprofloxacin 500 MG tablet  Commonly known as:  CIPRO      TAKE these medications       ALIGN 4 MG Caps  Take 1 capsule by mouth daily.     aspirin 325 MG tablet  Take 325 mg by mouth daily.     NASACORT ALLERGY 24HR 55 MCG/ACT Aero nasal inhaler  Generic drug:  triamcinolone  Place 2 sprays into the nose daily.     temazepam 15 MG capsule  Commonly known as:  RESTORIL  1-2 tabs by mouth at bedtime as needed for sleep     traMADol 50 MG tablet  Commonly known as:  ULTRAM  Take 1 tablet (50 mg total) by mouth every 6 (six)  hours as needed.     ZYRTEC ALLERGY 10 MG tablet  Generic drug:  cetirizine  Take 10 mg by mouth daily.           Follow-up Information   Follow up with Lajean Saveramirez Jr., Lillyahna Hemberger, MD. Schedule an appointment as soon as possible for a visit in 2 weeks. (For wound re-check)    Specialty:  General Surgery   Contact information:   [address] [place] [address] [phone]       Signed: Axel Fillerrmando Jabes Primo 06/10/2013, 5:27 PM

## 2013-06-10 NOTE — Telephone Encounter (Signed)
I attempted to contact the patient on Friday 5:35 PM to discuss the type of pain she was having. There was no answer. The patient in the hospital was having nausea with Percocet and has an allergy to codeine-nausea. The patient can attempt alternating tramadol and Tylenol to see this patient.

## 2013-06-10 NOTE — Telephone Encounter (Signed)
Pt called to make appt to get her staples removed post surgery. Verbal received from Derrell LollingRamirez to have these removed 7-10 days. Made pt a nurse only for 5/21. Pt verbalized understanding and agrees with POC.

## 2013-06-13 ENCOUNTER — Encounter: Payer: Self-pay | Admitting: Physician Assistant

## 2013-06-13 ENCOUNTER — Telehealth (INDEPENDENT_AMBULATORY_CARE_PROVIDER_SITE_OTHER): Payer: Self-pay

## 2013-06-13 ENCOUNTER — Ambulatory Visit (INDEPENDENT_AMBULATORY_CARE_PROVIDER_SITE_OTHER): Payer: Medicare Other | Admitting: Physician Assistant

## 2013-06-13 ENCOUNTER — Other Ambulatory Visit (INDEPENDENT_AMBULATORY_CARE_PROVIDER_SITE_OTHER): Payer: Self-pay

## 2013-06-13 VITALS — BP 130/80 | HR 65 | Temp 97.9°F | Resp 18 | Wt 143.0 lb

## 2013-06-13 DIAGNOSIS — R300 Dysuria: Secondary | ICD-10-CM

## 2013-06-13 DIAGNOSIS — R609 Edema, unspecified: Secondary | ICD-10-CM

## 2013-06-13 DIAGNOSIS — N390 Urinary tract infection, site not specified: Secondary | ICD-10-CM

## 2013-06-13 LAB — POCT URINALYSIS DIPSTICK
Bilirubin, UA: NEGATIVE
Blood, UA: NEGATIVE
Glucose, UA: NEGATIVE
Ketones, UA: NEGATIVE
Nitrite, UA: NEGATIVE
Protein, UA: NEGATIVE
Spec Grav, UA: 1.015
Urobilinogen, UA: 0.2
pH, UA: 7.5

## 2013-06-13 MED ORDER — NITROFURANTOIN MONOHYD MACRO 100 MG PO CAPS: 100.0000 mg | ORAL_CAPSULE | Freq: Two times a day (BID) | ORAL | Status: DC

## 2013-06-13 MED ORDER — HYDROCODONE-ACETAMINOPHEN 5-500 MG PO TABS: 1.0000 | ORAL_TABLET | Freq: Four times a day (QID) | ORAL | Status: DC | PRN

## 2013-06-13 NOTE — Progress Notes (Signed)
Pre visit review using our clinic review tool, if applicable. No additional management support is needed unless otherwise documented below in the visit note. 

## 2013-06-13 NOTE — Telephone Encounter (Signed)
LMOM for pt to call back and ask for triage. Pt needs to be advised of attached note re: pain med.

## 2013-06-13 NOTE — Telephone Encounter (Signed)
Patient states she has increased urination and swollon feet.  Advised to call he PCP she may a  UA C/S to determine if she has a UTI . She will call DR. John and call us if she does not get any results.

## 2013-06-13 NOTE — Telephone Encounter (Signed)
Tramadol is the best alternative for her since percocet in the hospital was causing lots of nausea and wretching.  We can give her lortab 5/500 #30 and see how she does with that.  Just be sure to warn her that this may also cause nausea similar to percocet.

## 2013-06-13 NOTE — Progress Notes (Signed)
Subjective:    Patient ID: Michelle SkainsNancy K Hubbard, female    DOB: [birth year], [age] y.o.   MRN: [id number]  Dysuria  This is a new problem. The current episode started yesterday. The problem occurs every urination. The problem has been unchanged. The quality of the pain is described as aching. The pain is mild. There has been no fever. Associated symptoms include frequency and urgency. Pertinent negatives include no chills, discharge, flank pain, hematuria, hesitancy, nausea, possible pregnancy, sweats or vomiting. She has tried nothing for the symptoms.     Review of Systems  Constitutional: Negative for fever and chills.  Respiratory: Negative for shortness of breath.   Cardiovascular: Negative for chest pain.  Gastrointestinal: Negative for nausea, vomiting and diarrhea.  Genitourinary: Positive for dysuria, urgency and frequency. Negative for hesitancy, hematuria and flank pain.  All other systems reviewed and are negative.   Past Medical History  Diagnosis Date  . DEPRESSIVE DISORDER 12/23/2006  . SINUSITIS- ACUTE-NOS 07/09/2007  . LOW BACK PAIN 12/23/2006  . HYPERLIPIDEMIA 01/08/2010  . 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 COPD 01/08/2010  . OSTEOPOROSIS 01/08/2010  . FATIGUE 01/08/2010  . Vitamin D deficiency 04/30/2010  . OAB (overactive bladder) 10/07/2011  . PONV (postoperative nausea and vomiting)    Past Surgical History  Procedure Laterality Date  . Breast biopsy  1986  . Oophorectomy  bilat 1988    secondary to cyst  . Abdominal hysterectomy    . Colon surgery  2000    colon obstruction  . Appendectomy  1988  . Colon resection N/A 06/03/2013    Procedure: LAPAROSCOPY DIAGNOSTIC and OPEN  SIGMOID COLON RESECTION ;  Surgeon: Axel FillerArmando Ramirez, MD;  Location: [facility];  Service: General;  Laterality: N/A;    reports that she has been smoking.  She does not have any smokeless tobacco history on file. She reports that she drinks alcohol. She reports that she does  not use illicit drugs. family history includes Colon cancer in her brother and mother; Diabetes in her maternal aunt and maternal grandmother. Allergies  Allergen Reactions  . Codeine     REACTION: nausea  . Penicillins        Objective:   Physical Exam  Nursing note and vitals reviewed. Constitutional: She is oriented to person, place, and time. She appears well-developed and well-nourished. No distress.  HENT:  Head: Normocephalic and atraumatic.  Eyes: Conjunctivae and EOM are normal. Pupils are equal, round, and reactive to light.  Neck: Normal range of motion. Neck supple.  Cardiovascular: Normal rate, regular rhythm, normal heart sounds and intact distal pulses.  Exam reveals no gallop and no friction rub.   No murmur heard. Pulmonary/Chest: Effort normal and breath sounds normal. No respiratory distress. She has no wheezes. She has no rales. She exhibits no tenderness.  Musculoskeletal: Normal range of motion. She exhibits edema (1+ bilateral LE).  Neurological: She is alert and oriented to person, place, and time.  Skin: Skin is warm and dry. No rash noted. She is not diaphoretic. No erythema. No pallor.  Psychiatric: She has a normal mood and affect. Her behavior is normal. Judgment and thought content normal.   Filed Vitals:   06/13/13 1128  BP: 130/80  Pulse: 65  Temp: 97.9 F (36.6 C)  Resp: 18   Lab Results  Component Value Date   WBC 11.2* 06/04/2013   HGB 11.4* 06/04/2013   HCT 35.1* 06/04/2013   PLT 350 06/04/2013   GLUCOSE  113* 06/04/2013   CHOL 220* 10/07/2011   TRIG 150.0* 10/07/2011   HDL 54.50 10/07/2011   LDLDIRECT 146.3 10/07/2011   LDLCALC 123* 01/08/2010   ALT 13 05/27/2013   AST 19 05/27/2013   NA 140 06/04/2013   K 4.4 06/04/2013   CL 104 06/04/2013   CREATININE 0.62 06/04/2013   BUN 10 06/04/2013   CO2 29 06/04/2013   TSH 2.10 10/07/2011   INR 0.96 05/27/2013    Urinalysis    Component Value Date/Time   COLORURINE LT. YELLOW 10/07/2011 0919   APPEARANCEUR CLEAR  10/07/2011 0919   LABSPEC 1.020 10/07/2011 0919   PHURINE 5.5 10/07/2011 0919   GLUCOSEU NEGATIVE 10/07/2011 0919   HGBUR NEGATIVE 10/07/2011 0919   BILIRUBINUR n 06/13/2013 1131   BILIRUBINUR NEGATIVE 10/07/2011 0919   KETONESUR NEGATIVE 10/07/2011 0919   PROTEINUR n 06/13/2013 1131   UROBILINOGEN 0.2 06/13/2013 1131   UROBILINOGEN 0.2 10/07/2011 0919   NITRITE n 06/13/2013 1131   NITRITE NEGATIVE 10/07/2011 0919   LEUKOCYTESUR Trace 06/13/2013 1131       Assessment & Plan:  Michelle Hubbard was seen today for dysuria.  Diagnoses and associated orders for this visit:  Dysuria - POCT urinalysis dipstick - Culture, Urine  UTI (urinary tract infection) - nitrofurantoin, macrocrystal-monohydrate, (MACROBID) 100 MG capsule; Take 1 capsule (100 mg total) by mouth 2 (two) times daily.  Edema - Mild edema. Pt recently had partial colectomy last week. Has been instructed to elevate legs to prevent edema. States she has been doing this and her edema looks better today than it has. She will continue conservative management with raising legs.   Plan to follow up in 1 week to reassess.   Patient Instructions  Nitrofurantoin twice a day for 7 days for UTI.  Drink plenty of fluids.  Followup in one week, or sooner symptoms are not improving or worsen despite treatment.

## 2013-06-13 NOTE — Patient Instructions (Signed)
Nitrofurantoin twice a day for 7 days for UTI.  Drink plenty of fluids.  Followup in one week, or sooner symptoms are not improving or worsen despite treatment.  Urinary Tract Infection A urinary tract infection (UTI) can occur any place along the urinary tract. The tract includes the kidneys, ureters, bladder, and urethra. A type of germ called bacteria often causes a UTI. UTIs are often helped with antibiotic medicine.  HOME CARE   If given, take antibiotics as told by your doctor. Finish them even if you start to feel better.  Drink enough fluids to keep your pee (urine) clear or pale yellow.  Avoid tea, drinks with caffeine, and bubbly (carbonated) drinks.  Pee often. Avoid holding your pee in for a long time.  Pee before and after having sex (intercourse).  Wipe from front to back after you poop (bowel movement) if you are a woman. Use each tissue only once. GET HELP RIGHT AWAY IF:   You have back pain.  You have lower belly (abdominal) pain.  You have chills.  You feel sick to your stomach (nauseous).  You throw up (vomit).  Your burning or discomfort with peeing does not go away.  You have a fever.  Your symptoms are not better in 3 days. MAKE SURE YOU:   Understand these instructions.  Will watch your condition.  Will get help right away if you are not doing well or get worse. Document Released: 07/02/2007 Document Revised: 10/08/2011 Document Reviewed: 08/14/2011 [facility] Patient Information 2014 [employer] [place].

## 2013-06-13 NOTE — Telephone Encounter (Signed)
Advised pt's daughther of the note from Dr Derrell Lollingamirez, she states that they would like to try the Rx for Lortab 5/500, will have urg office dr sign Rx. Informed her that this was similar to percocet and could cause the same reactions and they would still like to give this a try.

## 2013-06-13 NOTE — Telephone Encounter (Signed)
Per pts daughter pt did try tramadol and alternating with tylenol per Dr Ramirez's recommendation  but this does not cover her night time pain. She states pt is requesting other options for pain management at night. Pt allergic to codeine. Pt is currently at her PCPs office being treated for UTI. I advised her this request will be sent to Dr Derrell Lollingamirez and his assistant for review.

## 2013-06-13 NOTE — Progress Notes (Signed)
Called by pharmacist at [facility].  [phone].  Dose of hydrocodone clarified 5/325.  They no longer stock 5/500

## 2013-06-14 LAB — URINE CULTURE
Colony Count: NO GROWTH
Organism ID, Bacteria: NO GROWTH

## 2013-06-16 ENCOUNTER — Ambulatory Visit (INDEPENDENT_AMBULATORY_CARE_PROVIDER_SITE_OTHER): Payer: Medicare Other

## 2013-06-16 DIAGNOSIS — Z4802 Encounter for removal of sutures: Secondary | ICD-10-CM

## 2013-06-16 NOTE — Progress Notes (Signed)
Pt is post op sigmoid colectomy by Dr. Derrell Lollingamirez.  She is here today to have her staples removed.  Her incision is healing well with just a slight separation below the umbilicus.  Staples were removed and steri strips placed.  Gauze was placed over the separated area to protect the pt's clothing from oozing blood.  Pt doing very well post operatively.  She was reminded of her post op appointment with Dr. Derrell Lollingamirez.

## 2013-06-22 ENCOUNTER — Encounter (INDEPENDENT_AMBULATORY_CARE_PROVIDER_SITE_OTHER): Payer: Self-pay | Admitting: General Surgery

## 2013-06-22 ENCOUNTER — Ambulatory Visit (INDEPENDENT_AMBULATORY_CARE_PROVIDER_SITE_OTHER): Payer: Medicare Other | Admitting: General Surgery

## 2013-06-22 VITALS — BP 140/90 | HR 80 | Temp 97.8°F | Resp 14 | Ht 66.0 in | Wt 138.4 lb

## 2013-06-22 DIAGNOSIS — Z9049 Acquired absence of other specified parts of digestive tract: Secondary | ICD-10-CM

## 2013-06-22 DIAGNOSIS — Z9889 Other specified postprocedural states: Secondary | ICD-10-CM

## 2013-06-22 NOTE — Progress Notes (Signed)
Patient ID: [name], female   DOB: [birth year], 68 y.o.   MRN: [id number] Post op course The patient is a 68 year old female status post sigmoid colon resection for acute/smoldering diverticulitis. The patient has been doing well postoperatively. She states that her preoperative pain has since resolved. Her staples were removed and Steri-Strips applied. The patient is getting outside and walking getting her stamina back.  On Exam: Midline incision is clean dry and tacked  Pathology:   Acute diverticulitis, diverticulosis, no malignancy.  This was discussed with the patient. A copy of pathology report was given to the patient  Assessment and Plan 68 year old female status post sigmoid resection for acute diverticulitis/smoldering diverticulitis 1.  The patient can continue with her aerobic activity. X-rays are not to do any heavy weight lifting for 3 weeks. The patient can drive as long she is on pain medications. 2. The patient and follow back up in one month for incision check. 3. Patient can take Tylenol for her as needed pain medication    Axel Filler, MD [facility] General & Minimally Invasive Surgery Trauma & Emergency Surgery

## 2013-06-27 ENCOUNTER — Ambulatory Visit: Payer: Medicare Other | Admitting: Physician Assistant

## 2013-07-27 ENCOUNTER — Ambulatory Visit (INDEPENDENT_AMBULATORY_CARE_PROVIDER_SITE_OTHER): Payer: Medicare Other | Admitting: General Surgery

## 2013-07-27 ENCOUNTER — Encounter (INDEPENDENT_AMBULATORY_CARE_PROVIDER_SITE_OTHER): Payer: Self-pay | Admitting: General Surgery

## 2013-07-27 VITALS — BP 114/76 | HR 66 | Temp 97.6°F | Resp 16 | Wt 137.0 lb

## 2013-07-27 DIAGNOSIS — Z9049 Acquired absence of other specified parts of digestive tract: Secondary | ICD-10-CM

## 2013-07-27 DIAGNOSIS — Z9889 Other specified postprocedural states: Secondary | ICD-10-CM

## 2013-07-27 NOTE — Progress Notes (Signed)
Patient ID: Michelle SkainsNancy K Cuffe, female   DOB: 09/04/1945, 68 y.o.   MRN: [id number] Post op course The patient is a 68 year old female status post sigmoid colectomy. Patient has been doing well since her last visit. She's continue with aerobics at this time. She his only feeling a minimal burning sensation of the incision.  On Exam: Her wound is clean dry and intact.   Assessment and Plan 68 year old female status post sigmoid colectomy for diverticulitis 1. The patient follow up as needed 2. Patient has no activity restrictions.   Axel FillerArmando Shanelle Clontz, MD [facility] General [facility]imally Invasive Surgery Trauma & Emergency Surgery

## 2013-09-16 ENCOUNTER — Encounter: Payer: Self-pay | Admitting: Internal Medicine

## 2013-09-16 ENCOUNTER — Other Ambulatory Visit: Payer: Self-pay

## 2013-09-16 DIAGNOSIS — F5104 Psychophysiologic insomnia: Secondary | ICD-10-CM

## 2013-09-16 MED ORDER — TEMAZEPAM 15 MG PO CAPS: ORAL_CAPSULE | ORAL | Status: DC

## 2013-09-16 NOTE — Telephone Encounter (Signed)
Done hardcopy to robin  

## 2013-12-20 ENCOUNTER — Encounter: Payer: Self-pay | Admitting: Internal Medicine

## 2013-12-20 ENCOUNTER — Ambulatory Visit (INDEPENDENT_AMBULATORY_CARE_PROVIDER_SITE_OTHER): Payer: Medicare Other | Admitting: Internal Medicine

## 2013-12-20 ENCOUNTER — Other Ambulatory Visit (INDEPENDENT_AMBULATORY_CARE_PROVIDER_SITE_OTHER): Payer: Medicare Other

## 2013-12-20 VITALS — BP 134/86 | HR 90 | Temp 98.1°F | Ht 67.0 in | Wt 143.0 lb

## 2013-12-20 DIAGNOSIS — J438 Other emphysema: Secondary | ICD-10-CM

## 2013-12-20 DIAGNOSIS — Z23 Encounter for immunization: Secondary | ICD-10-CM

## 2013-12-20 DIAGNOSIS — E785 Hyperlipidemia, unspecified: Secondary | ICD-10-CM

## 2013-12-20 DIAGNOSIS — H9193 Unspecified hearing loss, bilateral: Secondary | ICD-10-CM

## 2013-12-20 DIAGNOSIS — F5104 Psychophysiologic insomnia: Secondary | ICD-10-CM

## 2013-12-20 DIAGNOSIS — G4700 Insomnia, unspecified: Secondary | ICD-10-CM

## 2013-12-20 DIAGNOSIS — Z720 Tobacco use: Secondary | ICD-10-CM

## 2013-12-20 DIAGNOSIS — F17200 Nicotine dependence, unspecified, uncomplicated: Secondary | ICD-10-CM

## 2013-12-20 LAB — URINALYSIS, ROUTINE W REFLEX MICROSCOPIC
Bilirubin Urine: NEGATIVE
Hgb urine dipstick: NEGATIVE
Ketones, ur: NEGATIVE
Leukocytes, UA: NEGATIVE
Nitrite: NEGATIVE
RBC / HPF: NONE SEEN (ref 0–?)
Specific Gravity, Urine: 1.01 (ref 1.000–1.030)
Total Protein, Urine: NEGATIVE
Urine Glucose: NEGATIVE
Urobilinogen, UA: 0.2 (ref 0.0–1.0)
pH: 7 (ref 5.0–8.0)

## 2013-12-20 LAB — LIPID PANEL
Cholesterol: 182 mg/dL (ref 0–200)
HDL: 51.9 mg/dL (ref >39.00)
LDL Cholesterol: 113 mg/dL — ABNORMAL HIGH (ref 0–99)
NonHDL: 130.1
Total CHOL/HDL Ratio: 4
Triglycerides: 85 mg/dL (ref 0.0–149.0)
VLDL: 17 mg/dL (ref 0.0–40.0)

## 2013-12-20 LAB — HEPATIC FUNCTION PANEL
ALT: 13 U/L (ref 0–35)
AST: 19 U/L (ref 0–37)
Albumin: 4.2 g/dL (ref 3.5–5.2)
Alkaline Phosphatase: 66 U/L (ref 39–117)
Bilirubin, Direct: 0.1 mg/dL (ref 0.0–0.3)
Total Bilirubin: 0.3 mg/dL (ref 0.2–1.2)
Total Protein: 7.4 g/dL (ref 6.0–8.3)

## 2013-12-20 LAB — CBC WITH DIFFERENTIAL/PLATELET
Basophils Absolute: 0.1 10*3/uL (ref 0.0–0.1)
Basophils Relative: 1 % (ref 0.0–3.0)
Eosinophils Absolute: 0.3 10*3/uL (ref 0.0–0.7)
Eosinophils Relative: 3.6 % (ref 0.0–5.0)
HCT: 41.3 % (ref 36.0–46.0)
Hemoglobin: 13.4 g/dL (ref 12.0–15.0)
Lymphocytes Relative: 29.4 % (ref 12.0–46.0)
Lymphs Abs: 2.3 10*3/uL (ref 0.7–4.0)
MCHC: 32.5 g/dL (ref 30.0–36.0)
MCV: 91.6 fl (ref 78.0–100.0)
Monocytes Absolute: 0.6 10*3/uL (ref 0.1–1.0)
Monocytes Relative: 7.8 % (ref 3.0–12.0)
Neutro Abs: 4.6 10*3/uL (ref 1.4–7.7)
Neutrophils Relative %: 58.2 % (ref 43.0–77.0)
Platelets: 373 10*3/uL (ref 150.0–400.0)
RBC: 4.51 Mil/uL (ref 3.87–5.11)
RDW: 13.9 % (ref 11.5–15.5)
WBC: 7.8 10*3/uL (ref 4.0–10.5)

## 2013-12-20 LAB — BASIC METABOLIC PANEL
BUN: 15 mg/dL (ref 6–23)
CO2: 27 mEq/L (ref 19–32)
Calcium: 9 mg/dL (ref 8.4–10.5)
Chloride: 101 mEq/L (ref 96–112)
Creatinine, Ser: 0.7 mg/dL (ref 0.4–1.2)
GFR: 94.12 mL/min (ref >60.00)
Glucose, Bld: 92 mg/dL (ref 70–99)
Potassium: 4.2 mEq/L (ref 3.5–5.1)
Sodium: 138 mEq/L (ref 135–145)

## 2013-12-20 LAB — TSH: TSH: 2.07 u[IU]/mL (ref 0.35–4.50)

## 2013-12-20 MED ORDER — ESZOPICLONE 2 MG PO TABS: 2.0000 mg | ORAL_TABLET | Freq: Every evening | ORAL | Status: DC | PRN

## 2013-12-20 NOTE — Patient Instructions (Addendum)
You had the flu shot today  Please return in 2 weeks for a Nurse Visit for the Prevnar Pneumonia shot  Your ears were irrigated of wax today (both)  Ok to stop the temazepam since it is not working well  Please take all new medication as prescribed  - the Lunesta  Please continue all other medications as before, and refills have been done if requested.  Please have the pharmacy call with any other refills you may need.  Please continue your efforts at being more active, low cholesterol diet, and weight control.  You are otherwise up to date with prevention measures today.  Please keep your appointments with your specialists as you may have planned  You will be contacted regarding the referral for: CT scan for the chest (low dose CT scan for lung cancer screening)  Please go to the LAB in the Basement (turn left off the elevator) for the tests to be done today  You will be contacted by phone if any changes need to be made immediately.  Otherwise, you will receive a letter about your results with an explanation, but please check with MyChart first.  Please remember to sign up for MyChart if you have not done so, as this will be important to you in the future with finding out test results, communicating by private email, and scheduling acute appointments online when needed.  Please return in 1 year for your yearly visit, or sooner if needed

## 2013-12-20 NOTE — Progress Notes (Signed)
Pre visit review using our clinic review tool, if applicable. No additional management support is needed unless otherwise documented below in the visit note. 

## 2013-12-20 NOTE — Progress Notes (Signed)
Subjective:    Patient ID: Michelle SkainsNancy K Deblasi, female    DOB: 1945/04/08, 68 y.o.   MRN: [id number]  HPI  Here for yearly f/u;  Overall doing ok;  Pt denies CP, worsening SOB, DOE, wheezing, orthopnea, PND, worsening LE edema, palpitations, dizziness or syncope.  Pt denies neurological change such as new headache, facial or extremity weakness.  Pt denies polydipsia, polyuria, or low sugar symptoms. Pt states overall good compliance with treatment and medications, good tolerability, and has been trying to follow lower cholesterol diet.  Pt denies worsening depressive symptoms, suicidal ideation or panic. No fever, night sweats, wt loss, loss of appetite, or other constitutional symptoms.  Pt states good ability with ADL's, has low fall risk, home safety reviewed and adequate, no other significant changes in hearing or vision, and only occasionally active with exercise.  Pt is current smoker, not ready to quit.  Total 50 pack years ( 1 ppd for 50 yrs) For start low dose CT lung Ca screening;  Per CMS guidelines, I have determined the eligibility including patient age 37(55-80), and absence of any signs of lung cancer.  Specific calculation of number of pack years is documented.  Quit smoking years is documented.  Shared decision making engaged today, including a discussion of the benefits and harms of screening, discussion of need for followup with additional testing, risks of over-diagnosis, risk of false-positive screening examinations, and risk of radiation exposure.  Counseling done today on the importance of adherence to annual lunge cancer LDCT screening, importance of quit smoking and remaining quit, and tobacco cessation instructions given.  There is also discussion with patient regarding the impact of comorbidities, and patient states is able and willing to undergo diagnosis and treatment. Past Medical History  Diagnosis Date  . DEPRESSIVE DISORDER 12/23/2006  . SINUSITIS- ACUTE-NOS 07/09/2007  .  LOW BACK PAIN 12/23/2006  . HYPERLIPIDEMIA 01/08/2010  . 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 COPD 01/08/2010  . OSTEOPOROSIS 01/08/2010  . FATIGUE 01/08/2010  . Vitamin D deficiency 04/30/2010  . OAB (overactive bladder) 10/07/2011  . PONV (postoperative nausea and vomiting)    Past Surgical History  Procedure Laterality Date  . Breast biopsy  1986  . Oophorectomy  bilat 1988    secondary to cyst  . Abdominal hysterectomy    . Colon surgery  2000    colon obstruction  . Appendectomy  1988  . Colon resection N/A 06/03/2013    Procedure: LAPAROSCOPY DIAGNOSTIC and OPEN  SIGMOID COLON RESECTION ;  Surgeon: Axel FillerArmando Ramirez, MD;  Location: [facility];  Service: General;  Laterality: N/A;    reports that she has been smoking.  She does not have any smokeless tobacco history on file. She reports that she drinks alcohol. She reports that she does not use illicit drugs. family history includes Colon cancer in her brother and mother; Diabetes in her maternal aunt and maternal grandmother. Allergies  Allergen Reactions  . Codeine     REACTION: nausea  . Penicillins    Current Outpatient Prescriptions on File Prior to Visit  Medication Sig Dispense Refill  . aspirin 325 MG tablet Take 325 mg by mouth daily.    . cetirizine (ZYRTEC ALLERGY) 10 MG tablet Take 10 mg by mouth daily.    . Probiotic Product (ALIGN) 4 MG CAPS Take 1 capsule by mouth daily.    . temazepam (RESTORIL) 15 MG capsule 1-2 tabs by mouth at bedtime as needed for sleep 60 capsule 2  .  triamcinolone (NASACORT ALLERGY 24HR) 55 MCG/ACT AERO nasal inhaler Place 2 sprays into the nose daily.     No current facility-administered medications on file prior to visit.   Review of Systems Constitutional: Negative for increased diaphoresis, other activity, appetite or other siginficant weight change  HENT: Negative for worsening hearing loss, ear pain, facial swelling, mouth sores and neck stiffness.   Eyes: Negative for  other worsening pain, redness or visual disturbance.  Respiratory: Negative for shortness of breath and wheezing.   Cardiovascular: Negative for chest pain and palpitations.  Gastrointestinal: Negative for diarrhea, blood in stool, abdominal distention or other pain Genitourinary: Negative for hematuria, flank pain or change in urine volume.  Musculoskeletal: Negative for myalgias or other joint complaints.  Skin: Negative for color change and wound.  Neurological: Negative for syncope and numbness. other than noted Hematological: Negative for adenopathy. or other swelling Psychiatric/Behavioral: Negative for hallucinations, self-injury, decreased concentration or other worsening agitation.      Objective:   Physical Exam BP 134/86 mmHg  Pulse 90  Temp(Src) 98.1 F (36.7 C) (Oral)  Ht 5\' 7"  (1.702 m)  Wt 143 lb (64.864 kg)  BMI 22.39 kg/m2  SpO2 95% VS noted,  Constitutional: Pt is oriented to person, place, and time. Appears well-developed and well-nourished.  Head: Normocephalic and atraumatic.  Right Ear: External ear normal.  Left Ear: External ear normal.  Nose: Nose normal.  Mouth/Throat: Oropharynx is clear and moist.  Eyes: Conjunctivae and EOM are normal. Pupils are equal, round, and reactive to light.  Neck: Normal range of motion. Neck supple. No JVD present. No tracheal deviation present.  Cardiovascular: Normal rate, regular rhythm, normal heart sounds and intact distal pulses.   Pulmonary/Chest: Effort normal and breath sounds without rales or wheezing but decrease BS bilat Abdominal: Soft. Bowel sounds are normal. NT. No HSM  Musculoskeletal: Normal range of motion. Exhibits no edema.  Lymphadenopathy:  Has no cervical adenopathy.  Neurological: Pt is alert and oriented to person, place, and time. Pt has normal reflexes. No cranial nerve deficit. Motor grossly intact Skin: Skin is warm and dry. No rash noted.  Psychiatric:  Has normal mood and affect. Behavior is  normal.     Assessment & Plan:

## 2013-12-20 NOTE — Assessment & Plan Note (Signed)
Ok for low dose CT scan screening, urged to quit

## 2013-12-20 NOTE — Assessment & Plan Note (Signed)
Mild to mod uncontrolled even with temazepam, screened for depression - neg, for Lunesta 2 mg prn

## 2013-12-20 NOTE — Assessment & Plan Note (Signed)
Improved with irrigation,  to f/u any worsening symptoms or concerns  

## 2013-12-20 NOTE — Assessment & Plan Note (Signed)
stable overall by history and exam, recent data reviewed with pt, and pt to continue medical treatment as before,  to f/u any worsening symptoms or concerns Lab Results  Component Value Date   LDLCALC 123* 01/08/2010

## 2013-12-20 NOTE — Assessment & Plan Note (Addendum)
stable overall by history and exam, recent data reviewed with pt, and pt to continue medical treatment as before,  to f/u any worsening symptoms or concerns ble SpO2 Readings from Last 3 Encounters:  12/20/13 95%  06/13/13 97%  06/09/13 93%   Note:  Total time for pt hx, exam, review of record with pt in the room, determination of diagnoses and plan for further eval and tx is > 40 min, with over 50% spent in coordination and counseling of patient

## 2013-12-23 ENCOUNTER — Encounter: Payer: Medicare Other | Admitting: Internal Medicine

## 2014-01-06 ENCOUNTER — Ambulatory Visit (INDEPENDENT_AMBULATORY_CARE_PROVIDER_SITE_OTHER)
Admission: RE | Admit: 2014-01-06 | Discharge: 2014-01-06 | Disposition: A | Discharge status: Home / Self Care | Payer: Medicare Other | Source: Ambulatory Visit | Attending: Internal Medicine | Admitting: Internal Medicine

## 2014-01-06 DIAGNOSIS — Z122 Encounter for screening for malignant neoplasm of respiratory organs: Secondary | ICD-10-CM | POA: Diagnosis not present

## 2014-01-06 DIAGNOSIS — Z720 Tobacco use: Secondary | ICD-10-CM | POA: Diagnosis not present

## 2014-01-06 DIAGNOSIS — F17200 Nicotine dependence, unspecified, uncomplicated: Secondary | ICD-10-CM

## 2014-01-06 DIAGNOSIS — J438 Other emphysema: Secondary | ICD-10-CM

## 2014-01-10 ENCOUNTER — Ambulatory Visit: Payer: Medicare Other

## 2014-01-12 ENCOUNTER — Encounter: Payer: Self-pay | Admitting: Internal Medicine

## 2014-02-23 ENCOUNTER — Other Ambulatory Visit: Payer: Self-pay

## 2014-02-23 ENCOUNTER — Encounter: Payer: Self-pay | Admitting: Internal Medicine

## 2014-02-23 DIAGNOSIS — F5104 Psychophysiologic insomnia: Secondary | ICD-10-CM

## 2014-02-23 MED ORDER — TEMAZEPAM 15 MG PO CAPS: ORAL_CAPSULE | ORAL | Status: DC

## 2014-02-23 NOTE — Telephone Encounter (Signed)
Done hardcopy to robin  

## 2014-02-23 NOTE — Telephone Encounter (Signed)
Faxed hardcopy for Tem[facility]epam to [facility].

## 2014-06-05 ENCOUNTER — Other Ambulatory Visit: Payer: Self-pay | Admitting: Internal Medicine

## 2014-06-06 NOTE — Telephone Encounter (Signed)
Done hardcopy to Cher[facility]  

## 2014-06-06 NOTE — Telephone Encounter (Signed)
Rx sent to pharmacy   

## 2014-06-27 ENCOUNTER — Encounter: Payer: Self-pay | Admitting: Internal Medicine

## 2014-07-05 ENCOUNTER — Encounter: Payer: Self-pay | Admitting: Internal Medicine

## 2014-07-05 MED ORDER — BUPROPION HCL ER (SR) 150 MG PO TB12: 150.0000 mg | ORAL_TABLET | Freq: Two times a day (BID) | ORAL | Status: DC

## 2014-07-05 NOTE — Telephone Encounter (Signed)
Ok for refill - this is done erx

## 2014-09-06 ENCOUNTER — Other Ambulatory Visit: Payer: Self-pay | Admitting: Internal Medicine

## 2014-09-06 NOTE — Telephone Encounter (Signed)
Last OV 12/20/2013

## 2014-09-06 NOTE — Telephone Encounter (Signed)
Rx faxed to pharmacy  

## 2014-09-06 NOTE — Telephone Encounter (Signed)
Done hardcopy to Dahlia  

## 2014-11-21 ENCOUNTER — Telehealth: Payer: Self-pay

## 2014-11-21 NOTE — Telephone Encounter (Signed)
Call to introduce AWV; Agreed to come in 11/2 on Tuesday at 11am

## 2014-11-28 ENCOUNTER — Ambulatory Visit (INDEPENDENT_AMBULATORY_CARE_PROVIDER_SITE_OTHER): Payer: Medicare Other

## 2014-11-28 VITALS — BP 144/84 | Ht 65.0 in | Wt 138.8 lb

## 2014-11-28 DIAGNOSIS — Z0000 Encounter for general adult medical examination without abnormal findings: Secondary | ICD-10-CM | POA: Diagnosis not present

## 2014-11-28 DIAGNOSIS — Z23 Encounter for immunization: Secondary | ICD-10-CM | POA: Diagnosis not present

## 2014-11-28 DIAGNOSIS — Z1159 Encounter for screening for other viral diseases: Secondary | ICD-10-CM

## 2014-11-28 DIAGNOSIS — Z1231 Encounter for screening mammogram for malignant neoplasm of breast: Secondary | ICD-10-CM

## 2014-11-28 NOTE — Patient Instructions (Addendum)
Michelle Hubbard , Thank you for taking time to come for your Medicare Wellness Visit. I appreciate your ongoing commitment to your health goals. Please review the following plan we discussed and let me know if I can assist you in the future.   Taking high dose flu Continue to consider smoking cessation Smoking;  Educated to avoid secondary smoke Smoking cessation at [facility]: [phone] Classes offered a couple of times a month;  Will work with the patient as far as registration and location  Meds may help; chatix (Varenicline); Zyban (Bupropion SR); Nicotine Replacement (gum; lozenges; patches; etc.)  30 pack yr smoking hx: Educated regarding LDCT; To discuss with MD at next fup.  These are the goals we discussed: Goals    None      This is a list of the screening recommended for you and due dates:  Health Maintenance  Topic Date Due  .  Hepatitis C: One time screening is recommended by Center for Disease Control  (CDC) for  adults born from [birth year] through 1967.   17-Sep-1945  . Shingles Vaccine  01/16/2004  . Pneumonia vaccines (2 of 2 - PCV13) 01/09/2011  . Flu Shot  08/28/2014  . Mammogram  01/25/2015  . Colon Cancer Screening  02/05/2017  . Tetanus Vaccine  10/06/2021  . DEXA scan (bone density measurement)  Completed     Fall Prevention in the Home  Falls can cause injuries. They can happen to people of all ages. There are many things you can do to make your home safe and to help prevent falls.  WHAT CAN I DO ON THE OUTSIDE OF MY HOME?  Regularly fix the edges of walkways and driveways and fix any cracks.  Remove anything that might make you trip as you walk through a door, such as a raised step or threshold.  Trim any bushes or trees on the path to your home.  Use bright outdoor lighting.  Clear any walking paths of anything that might make someone trip, such as rocks or tools.  Regularly check to see if handrails are loose or broken. Make sure that both sides of any  steps have handrails.  Any raised decks and porches should have guardrails on the edges.  Have any leaves, snow, or ice cleared regularly.  Use sand or salt on walking paths during winter.  Clean up any spills in your garage right away. This includes oil or grease spills. WHAT CAN I DO IN THE BATHROOM?   Use night lights.  Install grab bars by the toilet and in the tub and shower. Do not use towel bars as grab bars.  Use non-skid mats or decals in the tub or shower.  If you need to sit down in the shower, use a plastic, non-slip stool.  Keep the floor dry. Clean up any water that spills on the floor as soon as it happens.  Remove soap buildup in the tub or shower regularly.  Attach bath mats securely with double-sided non-slip rug tape.  Do not have throw rugs and other things on the floor that can make you trip. WHAT CAN I DO IN THE BEDROOM?  Use night lights.  Make sure that you have a light by your bed that is easy to reach.  Do not use any sheets or blankets that are too big for your bed. They should not hang down onto the floor.  Have a firm chair that has side arms. You can use this for support while you  get dressed.  Do not have throw rugs and other things on the floor that can make you trip. WHAT CAN I DO IN THE KITCHEN?  Clean up any spills right away.  Avoid walking on wet floors.  Keep items that you use a lot in easy-to-reach places.  If you need to reach something above you, use a strong step stool that has a grab bar.  Keep electrical cords out of the way.  Do not use floor polish or wax that makes floors slippery. If you must use wax, use non-skid floor wax.  Do not have throw rugs and other things on the floor that can make you trip. WHAT CAN I DO WITH MY STAIRS?  Do not leave any items on the stairs.  Make sure that there are handrails on both sides of the stairs and use them. Fix handrails that are broken or loose. Make sure that handrails are  as long as the stairways.  Check any carpeting to make sure that it is firmly attached to the stairs. Fix any carpet that is loose or worn.  Avoid having throw rugs at the top or bottom of the stairs. If you do have throw rugs, attach them to the floor with carpet tape.  Make sure that you have a light switch at the top of the stairs and the bottom of the stairs. If you do not have them, ask someone to add them for you. WHAT ELSE CAN I DO TO HELP PREVENT FALLS?  Wear shoes that:  Do not have high heels.  Have rubber bottoms.  Are comfortable and fit you well.  Are closed at the toe. Do not wear sandals.  If you use a stepladder:  Make sure that it is fully opened. Do not climb a closed stepladder.  Make sure that both sides of the stepladder are locked into place.  Ask someone to hold it for you, if possible.  Clearly mark and make sure that you can see:  Any grab bars or handrails.  First and last steps.  Where the edge of each step is.  Use tools that help you move around (mobility aids) if they are needed. These include:  Canes.  Walkers.  Scooters.  Crutches.  Turn on the lights when you go into a dark area. Replace any light bulbs as soon as they burn out.  Set up your furniture so you have a clear path. Avoid moving your furniture around.  If any of your floors are uneven, fix them.  If there are any pets around you, be aware of where they are.  Review your medicines with your doctor. Some medicines can make you feel dizzy. This can increase your chance of falling. Ask your doctor what other things that you can do to help prevent falls.   This information is not intended to replace advice given to you by your health care provider. Make sure you discuss any questions you have with your health care provider.   Document Released: 11/09/2008 Document Revised: 05/30/2014 Document Reviewed: 02/17/2014 Elsevier Interactive Patient Education 2016 [employer] Maintenance, Female Adopting a healthy lifestyle and getting preventive care can go a long way to promote health and wellness. Talk with your health care provider about what schedule of regular examinations is right for you. This is a good chance for you to check in with your provider about disease prevention and staying healthy. In between checkups, there are plenty of things you can do  on your own. Experts have done a lot of research about which lifestyle changes and preventive measures are most likely to keep you healthy. Ask your health care provider for more information. WEIGHT AND DIET  Eat a healthy diet  Be sure to include plenty of vegetables, fruits, low-fat dairy products, and lean protein.  Do not eat a lot of foods high in solid fats, added sugars, or salt.  Get regular exercise. This is one of the most important things you can do for your health.  Most adults should exercise for at least 150 minutes each week. The exercise should increase your heart rate and make you sweat (moderate-intensity exercise).  Most adults should also do strengthening exercises at least twice a week. This is in addition to the moderate-intensity exercise.  Maintain a healthy weight  Body mass index (BMI) is a measurement that can be used to identify possible weight problems. It estimates body fat based on height and weight. Your health care provider can help determine your BMI and help you achieve or maintain a healthy weight.  For females 25 years of age and older:   A BMI below 18.5 is considered underweight.  A BMI of 18.5 to 24.9 is normal.  A BMI of 25 to 29.9 is considered overweight.  A BMI of 30 and above is considered obese.  Watch levels of cholesterol and blood lipids  You should start having your blood tested for lipids and cholesterol at 71 years of age, then have this test every 5 years.  You may need to have your cholesterol levels checked more often if:  Your  lipid or cholesterol levels are high.  You are older than 71 years of age.  You are at high risk for heart disease.  CANCER SCREENING   Lung Cancer  Lung cancer screening is recommended for adults 77-102 years old who are at high risk for lung cancer because of a history of smoking.  A yearly low-dose CT scan of the lungs is recommended for people who:  Currently smoke.  Have quit within the past 15 years.  Have at least a 30-pack-year history of smoking. A pack year is smoking an average of one pack of cigarettes a day for 1 year.  Yearly screening should continue until it has been 15 years since you quit.  Yearly screening should stop if you develop a health problem that would prevent you from having lung cancer treatment.  Breast Cancer  Practice breast self-awareness. This means understanding how your breasts normally appear and feel.  It also means doing regular breast self-exams. Let your health care provider know about any changes, no matter how small.  If you are in your 20s or 30s, you should have a clinical breast exam (CBE) by a health care provider every 1-3 years as part of a regular health exam.  If you are 28 or older, have a CBE every year. Also consider having a breast X-ray (mammogram) every year.  If you have a family history of breast cancer, talk to your health care provider about genetic screening.  If you are at high risk for breast cancer, talk to your health care provider about having an MRI and a mammogram every year.  Breast cancer gene (BRCA) assessment is recommended for women who have family members with BRCA-related cancers. BRCA-related cancers include:  Breast.  Ovarian.  Tubal.  Peritoneal cancers.  Results of the assessment will determine the need for genetic counseling and BRCA1 and BRCA2  testing. Cervical Cancer Your health care provider may recommend that you be screened regularly for cancer of the pelvic organs (ovaries, uterus,  and vagina). This screening involves a pelvic examination, including checking for microscopic changes to the surface of your cervix (Pap test). You may be encouraged to have this screening done every 3 years, beginning at age 43.  For women ages 32-65, health care providers may recommend pelvic exams and Pap testing every 3 years, or they may recommend the Pap and pelvic exam, combined with testing for human papilloma virus (HPV), every 5 years. Some types of HPV increase your risk of cervical cancer. Testing for HPV may also be done on women of any age with unclear Pap test results.  Other health care providers may not recommend any screening for nonpregnant women who are considered low risk for pelvic cancer and who do not have symptoms. Ask your health care provider if a screening pelvic exam is right for you.  If you have had past treatment for cervical cancer or a condition that could lead to cancer, you need Pap tests and screening for cancer for at least 20 years after your treatment. If Pap tests have been discontinued, your risk factors (such as having a new sexual partner) need to be reassessed to determine if screening should resume. Some women have medical problems that increase the chance of getting cervical cancer. In these cases, your health care provider may recommend more frequent screening and Pap tests. Colorectal Cancer  This type of cancer can be detected and often prevented.  Routine colorectal cancer screening usually begins at 71 years of age and continues through 71 years of age.  Your health care provider may recommend screening at an earlier age if you have risk factors for colon cancer.  Your health care provider may also recommend using home test kits to check for hidden blood in the stool.  A small camera at the end of a tube can be used to examine your colon directly (sigmoidoscopy or colonoscopy). This is done to check for the earliest forms of colorectal  cancer.  Routine screening usually begins at age 27.  Direct examination of the colon should be repeated every 5-10 years through 71 years of age. However, you may need to be screened more often if early forms of precancerous polyps or small growths are found. Skin Cancer  Check your skin from head to toe regularly.  Tell your health care provider about any new moles or changes in moles, especially if there is a change in a mole's shape or color.  Also tell your health care provider if you have a mole that is larger than the size of a pencil eraser.  Always use sunscreen. Apply sunscreen liberally and repeatedly throughout the day.  Protect yourself by wearing long sleeves, pants, a wide-brimmed hat, and sunglasses whenever you are outside. HEART DISEASE, DIABETES, AND HIGH BLOOD PRESSURE   High blood pressure causes heart disease and increases the risk of stroke. High blood pressure is more likely to develop in:  People who have blood pressure in the high end of the normal range (130-139/85-89 mm Hg).  People who are overweight or obese.  People who are African American.  If you are 82-32 years of age, have your blood pressure checked every 3-5 years. If you are 6 years of age or older, have your blood pressure checked every year. You should have your blood pressure measured twice--once when you are at a hospital or  clinic, and once when you are not at a hospital or clinic. Record the average of the two measurements. To check your blood pressure when you are not at a hospital or clinic, you can use:  An automated blood pressure machine at a pharmacy.  A home blood pressure monitor.  If you are between 14 years and 57 years old, ask your health care provider if you should take aspirin to prevent strokes.  Have regular diabetes screenings. This involves taking a blood sample to check your fasting blood sugar level.  If you are at a normal weight and have a low risk for diabetes,  have this test once every three years after 71 years of age.  If you are overweight and have a high risk for diabetes, consider being tested at a younger age or more often. PREVENTING INFECTION  Hepatitis B  If you have a higher risk for hepatitis B, you should be screened for this virus. You are considered at high risk for hepatitis B if:  You were born in a country where hepatitis B is common. Ask your health care provider which countries are considered high risk.  Your parents were born in a high-risk country, and you have not been immunized against hepatitis B (hepatitis B vaccine).  You have HIV or AIDS.  You use needles to inject street drugs.  You live with someone who has hepatitis B.  You have had sex with someone who has hepatitis B.  You get hemodialysis treatment.  You take certain medicines for conditions, including cancer, organ transplantation, and autoimmune conditions. Hepatitis C  Blood testing is recommended for:  Everyone born from [birth year] through 1965.  Anyone with known risk factors for hepatitis C. Sexually transmitted infections (STIs)  You should be screened for sexually transmitted infections (STIs) including gonorrhea and chlamydia if:  You are sexually active and are younger than 71 years of age.  You are older than 71 years of age and your health care provider tells you that you are at risk for this type of infection.  Your sexual activity has changed since you were last screened and you are at an increased risk for chlamydia or gonorrhea. Ask your health care provider if you are at risk.  If you do not have HIV, but are at risk, it may be recommended that you take a prescription medicine daily to prevent HIV infection. This is called pre-exposure prophylaxis (PrEP). You are considered at risk if:  You are sexually active and do not regularly use condoms or know the HIV status of your partner(s).  You take drugs by injection.  You are sexually  active with a partner who has HIV. Talk with your health care provider about whether you are at high risk of being infected with HIV. If you choose to begin PrEP, you should first be tested for HIV. You should then be tested every 3 months for as long as you are taking PrEP.  PREGNANCY   If you are premenopausal and you may become pregnant, ask your health care provider about preconception counseling.  If you may become pregnant, take 400 to 800 micrograms (mcg) of folic acid every day.  If you want to prevent pregnancy, talk to your health care provider about birth control (contraception). OSTEOPOROSIS AND MENOPAUSE   Osteoporosis is a disease in which the bones lose minerals and strength with aging. This can result in serious bone fractures. Your risk for osteoporosis can be identified using a bone density  scan.  If you are 80 years of age or older, or if you are at risk for osteoporosis and fractures, ask your health care provider if you should be screened.  Ask your health care provider whether you should take a calcium or vitamin D supplement to lower your risk for osteoporosis.  Menopause may have certain physical symptoms and risks.  Hormone replacement therapy may reduce some of these symptoms and risks. Talk to your health care provider about whether hormone replacement therapy is right for you.  HOME CARE INSTRUCTIONS   Schedule regular health, dental, and eye exams.  Stay current with your immunizations.   Do not use any tobacco products including cigarettes, chewing tobacco, or electronic cigarettes.  If you are pregnant, do not drink alcohol.  If you are breastfeeding, limit how much and how often you drink alcohol.  Limit alcohol intake to no more than 1 drink per day for nonpregnant women. One drink equals 12 ounces of beer, 5 ounces of wine, or 1 ounces of hard liquor.  Do not use street drugs.  Do not share needles.  Ask your health care provider for help if  you need support or information about quitting drugs.  Tell your health care provider if you often feel depressed.  Tell your health care provider if you have ever been abused or do not feel safe at home.   This information is not intended to replace advice given to you by your health care provider. Make sure you discuss any questions you have with your health care provider.   Document Released: 07/29/2010 Document Revised: 02/03/2014 Document Reviewed: 12/15/2012 Elsevier Interactive Patient Education 2016 [employer].  Steps to Quit Smoking  Smoking tobacco can be harmful to your health and can affect almost every organ in your body. Smoking puts you, and those around you, at risk for developing many serious chronic diseases. Quitting smoking is difficult, but it is one of the best things that you can do for your health. It is never too late to quit. WHAT ARE THE BENEFITS OF QUITTING SMOKING? When you quit smoking, you lower your risk of developing serious diseases and conditions, such as:  Lung cancer or lung disease, such as COPD.  Heart disease.  Stroke.  Heart attack.  Infertility.  Osteoporosis and bone fractures. Additionally, symptoms such as coughing, wheezing, and shortness of breath may get better when you quit. You may also find that you get sick less often because your body is stronger at fighting off colds and infections. If you are pregnant, quitting smoking can help to reduce your chances of having a baby of low birth weight. HOW DO I GET READY TO QUIT? When you decide to quit smoking, create a plan to make sure that you are successful. Before you quit:  Pick a date to quit. Set a date within the next two weeks to give you time to prepare.  Write down the reasons why you are quitting. Keep this list in places where you will see it often, such as on your bathroom mirror or in your car or wallet.  Identify the people, places, things, and activities that make you  want to smoke (triggers) and avoid them. Make sure to take these actions:  Throw away all cigarettes at home, at work, and in your car.  Throw away smoking accessories, such as Scientist, research (medical).  Clean your car and make sure to empty the ashtray.  Clean your home, including curtains and carpets.  Tell your family, friends, and coworkers that you are quitting. Support from your loved ones can make quitting easier.  Talk with your health care provider about your options for quitting smoking.  Find out what treatment options are covered by your health insurance. WHAT STRATEGIES CAN I USE TO QUIT SMOKING?  Talk with your healthcare provider about different strategies to quit smoking. Some strategies include:  Quitting smoking altogether instead of gradually lessening how much you smoke over a period of time. Research shows that quitting "cold [place]" is more successful than gradually quitting.  Attending in-person counseling to help you build problem-solving skills. You are more likely to have success in quitting if you attend several counseling sessions. Even short sessions of 10 minutes can be effective.  Finding resources and support systems that can help you to quit smoking and remain smoke-free after you quit. These resources are most helpful when you use them often. They can include:  Online chats with a Social worker.  Telephone quitlines.  Printed Furniture conservator/restorer.  Support groups or group counseling.  Text messaging programs.  Mobile phone applications.  Taking medicines to help you quit smoking. (If you are pregnant or breastfeeding, talk with your health care provider first.) Some medicines contain nicotine and some do not. Both types of medicines help with cravings, but the medicines that include nicotine help to relieve withdrawal symptoms. Your health care provider may recommend:  Nicotine patches, gum, or lozenges.  Nicotine inhalers or sprays.  Non-nicotine  medicine that is taken by mouth. Talk with your health care provider about combining strategies, such as taking medicines while you are also receiving in-person counseling. Using these two strategies together makes you more likely to succeed in quitting than if you used either strategy on its own. If you are pregnant or breastfeeding, talk with your health care provider about finding counseling or other support strategies to quit smoking. Do not take medicine to help you quit smoking unless told to do so by your health care provider. WHAT THINGS CAN I DO TO MAKE IT EASIER TO QUIT? Quitting smoking might feel overwhelming at first, but there is a lot that you can do to make it easier. Take these important actions:  Reach out to your family and friends and ask that they support and encourage you during this time. Call telephone quitlines, reach out to support groups, or work with a counselor for support.  Ask people who smoke to avoid smoking around you.  Avoid places that trigger you to smoke, such as bars, parties, or smoke-break areas at work.  Spend time around people who do not smoke.  Lessen stress in your life, because stress can be a smoking trigger for some people. To lessen stress, try:  Exercising regularly.  Deep-breathing exercises.  Yoga.  Meditating.  Performing a body scan. This involves closing your eyes, scanning your body from head to toe, and noticing which parts of your body are particularly tense. Purposefully relax the muscles in those areas.  Download or purchase mobile phone or tablet apps (applications) that can help you stick to your quit plan by providing reminders, tips, and encouragement. There are many free apps, such as QuitGuide from the [employer] Office manager for Disease Control and Prevention). You can find other support for quitting smoking (smoking cessation) through smokefree.gov and other websites. HOW WILL I FEEL WHEN I QUIT SMOKING? Within the first 24 hours  of quitting smoking, you may start to feel some withdrawal symptoms. These symptoms are usually  most noticeable 2-3 days after quitting, but they usually do not last beyond 2-3 weeks. Changes or symptoms that you might experience include:  Mood swings.  Restlessness, anxiety, or irritation.  Difficulty concentrating.  Dizziness.  Strong cravings for sugary foods in addition to nicotine.  Mild weight gain.  Constipation.  Nausea.  Coughing or a sore throat.  Changes in how your medicines work in your body.  A depressed mood.  Difficulty sleeping (insomnia). After the first 2-3 weeks of quitting, you may start to notice more positive results, such as:  Improved sense of smell and taste.  Decreased coughing and sore throat.  Slower heart rate.  Lower blood pressure.  Clearer skin.  The ability to breathe more easily.  Fewer sick days. Quitting smoking is very challenging for most people. Do not get discouraged if you are not successful the first time. Some people need to make many attempts to quit before they achieve long-term success. Do your best to stick to your quit plan, and talk with your health care provider if you have any questions or concerns.   This information is not intended to replace advice given to you by your health care provider. Make sure you discuss any questions you have with your health care provider.   Document Released: 01/07/2001 Document Revised: 05/30/2014 Document Reviewed: 05/30/2014 Elsevier Interactive Patient Education [employer].

## 2014-11-28 NOTE — Progress Notes (Addendum)
Subjective:   Michelle Hubbard is a 69 y.o. female who presents for Medicare Annual (Subsequent) preventive examination.  Review of Systems:  HRA assessment completed during visit; [place] The Patient was informed that this wellness visit is to identify risk and educate on how to reduce risk for increase disease through lifestyle changes.   ROS deferred to CPE exam with physician  Medical issues  Airway obstruction; otherwise functioning well; no sob with activity States she loves to travel and went to [place] for 2 weeks; Stayed in tent; walked miles and she did fine.   Osteoporosis/ states she does have this but she is not going to take the medicine, so declines getting tested again. Takes Calcium; does not take Vit d but states she is in the sun. Does have weight training program   BP 150 80; Came down to 144/ 84;  Has been c/o of more dizziness,  Up on a ladder and felt dizzy this week;  c.o of more dizziness; up on ladder; have vertigo for years but this is worse; [place] for years Discussed issues with climbing and agrees that she should not climb ladders for now. Will make apt with Dr. Jonny Ruiz for CPE and repeat labs and fup on dizziness; Advised to have BP checked prior to her apt.   BMI: 23  Diet; breakfast yogurt and flax seed; lunch where she is at; nabs Supper; fixes meals; kids bring frozen food that she keeps and will eat;   Soup; Doesn't eat out very much;  Spouse had cancer and she was told not to eat out at that time, so now she eats in.   Exercise; weights x 3 a week; works at this consistently; bike and walk on treadmill;  Has exercise buddy; 60 minutes tiw Cleaning and painting; loves to do some home decorating;   Psychosocial;  Spouse died of cancer approx 8 years ago.   SAFETY/ Moved x 3 months ago; had large home and 18 acres of land; sold and moved; stayed in apt that was very small and was not happy Very happy now; bought home with 1500 sq feet  upstairs and downstairs and loves it; Missed having room but this home is  handicapped accessible, including the bathrooms.   Safety reviewed for the home; including removal of clutter; clear paths through the home, does not like clutter, railing as needed; bathroom safety; community safety; smoke detectors and carbon monoxide currently;  firearms safety  Driving accidents (no) and seatbelt- Yes   Stressors; 1 -5 (2)   Children 3; 2 live around the area;  Medication review/ no issues presented   Fall assessment / none/ walked into a door; hit vein in eye  Gait assessment/ mobile;   Mobilization and Functional losses in the last year. No changes Sleep patterns/ doesn't sleep; takes medicine that does help  Urinary or fecal incontinence reviewed/ no    Counseling: hep c screening; Dr. Jonny Ruiz to draw on CPE  Colonoscopy; 01/2012 diverticulitis/ had bowel removed; much better now EKG: 05/2013 Hearing: hearing aids;  Dexa 07/ 2012 - osteopenia- Dr. Jonny Ruiz to discuss; declines meds due to all the side effects; does take in calcium; does not take vit d but is out in the sun.  Mammagram 12/2012; [facility]; place an order today; will fup Ophthalmology exam; 12//2014/ had cataract surgery last year; once a year / Dr. Nile Riggs  Immunizations Due  Shingles/ had shingles x 3; will discuss possibility of taking zostavax with Dr. Jonny Ruiz.  Prevnar 13; deferred to physical Flu; took high does flu today    Current Care Team reviewed and updated Dr Jonny RuizJohn;       Objective:     Vitals: BP 144/84 mmHg  Ht 5\' 5"  (1.651 m)  Wt 138 lb 12 oz (62.937 kg)  BMI 23.09 kg/m2  Tobacco History  Smoking status  . Current Every Day Smoker -- 1.00 packs/day for 44 years  . Types: Cigarettes  Smokeless tobacco  . Not on file    Comment: smoking more due to move     Ready to quit: Not Answered Counseling given: Yes   Past Medical History  Diagnosis Date  . DEPRESSIVE DISORDER 12/23/2006  .  SINUSITIS- ACUTE-NOS 07/09/2007  . LOW BACK PAIN 12/23/2006  . HYPERLIPIDEMIA 01/08/2010  . 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 COPD 01/08/2010  . OSTEOPOROSIS 01/08/2010  . FATIGUE 01/08/2010  . Vitamin D deficiency 04/30/2010  . OAB (overactive bladder) 10/07/2011  . PONV (postoperative nausea and vomiting)    Past Surgical History  Procedure Laterality Date  . Breast biopsy  1986  . Oophorectomy  bilat 1988    secondary to cyst  . Abdominal hysterectomy    . Colon surgery  2000    colon obstruction  . Appendectomy  1988  . Colon resection N/A 06/03/2013    Procedure: LAPAROSCOPY DIAGNOSTIC and OPEN  SIGMOID COLON RESECTION ;  Surgeon: Axel FillerArmando Ramirez, MD;  Location: [facility];  Service: General;  Laterality: N/A;   Family History  Problem Relation Age of Onset  . Colon cancer Mother   . Colon cancer Brother   . Diabetes Maternal Aunt   . Diabetes Maternal Grandmother    History  Sexual Activity  . Sexual Activity: Not on file    Outpatient Encounter Prescriptions as of 11/28/2014  Medication Sig  . aspirin 325 MG tablet Take 325 mg by mouth daily.  . cetirizine (ZYRTEC ALLERGY) 10 MG tablet Take 10 mg by mouth daily.  . temazepam (RESTORIL) 15 MG capsule TAKE ONE TO TWO CAPSULES BY MOUTH AT BEDTIME AS NEEDED FOR SLEEP  . triamcinolone (NASACORT ALLERGY 24HR) 55 MCG/ACT AERO nasal inhaler Place 2 sprays into the nose daily.  Marland Kitchen. buPROPion (WELLBUTRIN SR) 150 MG 12 hr tablet Take 1 tablet (150 mg total) by mouth 2 (two) times daily. (Patient not taking: Reported on 11/28/2014)  . Probiotic Product (ALIGN) 4 MG CAPS Take 1 capsule by mouth daily.   No facility-administered encounter medications on file as of 11/28/2014.    Activities of Daily Living In your present state of health, do you have any difficulty performing the following activities: 11/28/2014  Hearing? Y  Vision? (No Data)  Difficulty concentrating or making decisions? N  Walking or climbing stairs? N   Dressing or bathing? N  Doing errands, shopping? N  Preparing Food and eating ? N  Using the Toilet? N  In the past six months, have you accidently leaked urine? (No Data)  Do you have problems with loss of bowel control? N  Managing your Medications? N  Managing your Finances? N  Housekeeping or managing your Housekeeping? N    Patient Care Team: Corwin LevinsJames W John, MD as PCP - General    Assessment:    Assessment   Patient presents for yearly preventative medicine examination. Medicare questionnaire screening were completed, i.e. Functional; fall risk; depression, memory loss and hearing. No issues; wears hearing aids;   All immunizations and health maintenance protocols  were reviewed with the patient and needed orders were placed. Agreed to take flu today; Will take prevnar when she sees Dr. Jonny Ruiz for CPE and labs.   Education provided for laboratory screens;  Agrees to take Hep c  Medication reconciliation, past medical history, social history, problem list and allergies were reviewed in detail with the patient  Goals were established with regard to smoking cessation. The patient verbalized "she has quit before". She will not smoke in her "new" home. We discussed her risk for her ongoing health and verbalizes smoking is not what she wants to do. She took wellbutrin before which helped and is seriously thinking about going on this again. States she really doesn't want to smoke.    End of life planning was discussed and she has completed this    Exercise Activities and Dietary recommendations Current Exercise Habits:: Structured exercise class, Type of exercise: strength training/weights;treadmill, Time (Minutes): 60, Frequency (Times/Week): 3, Weekly Exercise (Minutes/Week): 180, Intensity: Moderate  Goals    . Quit smoking / using tobacco     Smoking;  Educated to avoid secondary smoke Smoking cessation at [facility]: [phone] Classes offered a couple of times a month;  Will  work with the patient as far as registration and location Meds may help; chatix (Varenicline); Zyban (Bupropion SR); Nicotine Replacement (gum; lozenges; patches; etc.) 30 pack yr smoking hx: Educated regarding LDCT; To discuss with MD at next fup.        Fall Risk Fall Risk  11/28/2014 12/20/2013  Falls in the past year? No No   Depression Screen PHQ 2/9 Scores 11/28/2014 12/20/2013  PHQ - 2 Score 0 0     Cognitive Testing MMSE - Mini Mental State Exam 11/28/2014  Not completed: (No Data)    Immunization History  Administered Date(s) Administered  . Influenza Split 10/07/2011  . Influenza Whole 01/08/2010  . Influenza, High Dose Seasonal PF 11/28/2014  . Influenza,inj,Quad PF,36+ Mos 12/20/2013  . Pneumococcal Polysaccharide-23 01/08/2010  . Tdap 10/07/2011   Screening Tests Health Maintenance  Topic Date Due  . Hepatitis C Screening  11/28/1945  . ZOSTAVAX  01/16/2004  . PNA vac Low Risk Adult (2 of 2 - PCV13) 01/09/2011  . INFLUENZA VACCINE  08/28/2014  . MAMMOGRAM  01/25/2015  . COLONOSCOPY  02/05/2017  . TETANUS/TDAP  10/06/2021  . DEXA SCAN  Completed      Plan:     Taking high dose flu shot today.  Will take prevnar at CPE.  Agrees to have hep C screen and will order it but will complete when she has her other blood work drawn.   Will discuss zostavax with Dr. Jonny Ruiz as she has had shingles 3 times.  Discussed waiting a year since her last episode but not sure she wants to take it.   Given information on smoking cessation and to call for assistance.   Will check BP prior to apt with Dr. Jonny Ruiz and will not climb ladders any more due to dizziness; Will also discuss dizziness at her next apt.    During the course of the visit the patient was educated and counseled about the following appropriate screening and preventive services:   Vaccines to include Pneumoccal, Influenza, Hepatitis B, Td, Zostavax, HCV/ had flu; to discuss Zoster with Dr.  Jonny Ruiz  Electrocardiogram/ 05/2013  Cardiovascular Disease/ negative;   Colorectal cancer screening/ bowel resected last year and is doing much better;   Bone density screening/ 2012; declines treatment for osteo  It appears  she took 3 prolia injections; 2012;2013  Diabetes screening/ n/a  Glaucoma screening/ neg  Mammography/Scheduled for Mammogram; (external)   Nutrition counseling / weight good; BMI normal   Patient Instructions (the written plan) was given to the patient.   Odyn Turko, RN  11/28/2014   Medical screening examination/treatment/procedure(s) were performed by non-physician practitioner and as supervising physician I was immediately available for consultation/collaboration. I agree with above. Oliver Barre, MD

## 2014-12-01 ENCOUNTER — Other Ambulatory Visit: Payer: Self-pay

## 2014-12-01 DIAGNOSIS — Z1231 Encounter for screening mammogram for malignant neoplasm of breast: Secondary | ICD-10-CM

## 2014-12-04 ENCOUNTER — Encounter: Payer: Self-pay | Admitting: Internal Medicine

## 2014-12-05 MED ORDER — TEMAZEPAM 15 MG PO CAPS: ORAL_CAPSULE | ORAL | Status: DC

## 2014-12-05 NOTE — Telephone Encounter (Signed)
Done hardcopy to Dahlia  

## 2014-12-28 ENCOUNTER — Other Ambulatory Visit (INDEPENDENT_AMBULATORY_CARE_PROVIDER_SITE_OTHER): Payer: Medicare Other

## 2014-12-28 ENCOUNTER — Encounter: Payer: Self-pay | Admitting: Internal Medicine

## 2014-12-28 ENCOUNTER — Ambulatory Visit (INDEPENDENT_AMBULATORY_CARE_PROVIDER_SITE_OTHER): Payer: Medicare Other | Admitting: Internal Medicine

## 2014-12-28 VITALS — BP 130/78 | HR 80 | Temp 98.5°F | Ht 65.0 in | Wt 141.0 lb

## 2014-12-28 DIAGNOSIS — F32A Depression, unspecified: Secondary | ICD-10-CM

## 2014-12-28 DIAGNOSIS — Z23 Encounter for immunization: Secondary | ICD-10-CM

## 2014-12-28 DIAGNOSIS — R682 Dry mouth, unspecified: Secondary | ICD-10-CM

## 2014-12-28 DIAGNOSIS — E785 Hyperlipidemia, unspecified: Secondary | ICD-10-CM | POA: Diagnosis not present

## 2014-12-28 DIAGNOSIS — F329 Major depressive disorder, single episode, unspecified: Secondary | ICD-10-CM

## 2014-12-28 DIAGNOSIS — J438 Other emphysema: Secondary | ICD-10-CM | POA: Diagnosis not present

## 2014-12-28 DIAGNOSIS — Z1159 Encounter for screening for other viral diseases: Secondary | ICD-10-CM

## 2014-12-28 LAB — URINALYSIS, ROUTINE W REFLEX MICROSCOPIC
Bilirubin Urine: NEGATIVE
Hgb urine dipstick: NEGATIVE
Ketones, ur: NEGATIVE
Leukocytes, UA: NEGATIVE
Nitrite: NEGATIVE
RBC / HPF: NONE SEEN (ref 0–?)
Specific Gravity, Urine: 1.015 (ref 1.000–1.030)
Total Protein, Urine: NEGATIVE
Urine Glucose: NEGATIVE
Urobilinogen, UA: 0.2 (ref 0.0–1.0)
WBC, UA: NONE SEEN (ref 0–?)
pH: 5.5 (ref 5.0–8.0)

## 2014-12-28 LAB — BASIC METABOLIC PANEL
BUN: 15 mg/dL (ref 6–23)
CO2: 28 mEq/L (ref 19–32)
Calcium: 9.2 mg/dL (ref 8.4–10.5)
Chloride: 104 mEq/L (ref 96–112)
Creatinine, Ser: 0.7 mg/dL (ref 0.40–1.20)
GFR: 87.69 mL/min (ref >60.00)
Glucose, Bld: 94 mg/dL (ref 70–99)
Potassium: 4 mEq/L (ref 3.5–5.1)
Sodium: 138 mEq/L (ref 135–145)

## 2014-12-28 LAB — RHEUMATOID FACTOR: Rhuematoid fact SerPl-aCnc: 13 IU/mL (ref <=14)

## 2014-12-28 LAB — CBC WITH DIFFERENTIAL/PLATELET
Basophils Absolute: 0 10*3/uL (ref 0.0–0.1)
Basophils Relative: 0.5 % (ref 0.0–3.0)
Eosinophils Absolute: 0.3 10*3/uL (ref 0.0–0.7)
Eosinophils Relative: 4.9 % (ref 0.0–5.0)
HCT: 41.4 % (ref 36.0–46.0)
Hemoglobin: 13.7 g/dL (ref 12.0–15.0)
Lymphocytes Relative: 27.4 % (ref 12.0–46.0)
Lymphs Abs: 1.8 10*3/uL (ref 0.7–4.0)
MCHC: 33.1 g/dL (ref 30.0–36.0)
MCV: 91.8 fl (ref 78.0–100.0)
Monocytes Absolute: 0.5 10*3/uL (ref 0.1–1.0)
Monocytes Relative: 7.1 % (ref 3.0–12.0)
Neutro Abs: 3.9 10*3/uL (ref 1.4–7.7)
Neutrophils Relative %: 60.1 % (ref 43.0–77.0)
Platelets: 367 10*3/uL (ref 150.0–400.0)
RBC: 4.51 Mil/uL (ref 3.87–5.11)
RDW: 13.6 % (ref 11.5–15.5)
WBC: 6.6 10*3/uL (ref 4.0–10.5)

## 2014-12-28 LAB — LIPID PANEL
Cholesterol: 182 mg/dL (ref 0–200)
HDL: 52.9 mg/dL (ref >39.00)
LDL Cholesterol: 115 mg/dL — ABNORMAL HIGH (ref 0–99)
NonHDL: 129.41
Total CHOL/HDL Ratio: 3
Triglycerides: 70 mg/dL (ref 0.0–149.0)
VLDL: 14 mg/dL (ref 0.0–40.0)

## 2014-12-28 LAB — HEPATIC FUNCTION PANEL
ALT: 15 U/L (ref 0–35)
AST: 16 U/L (ref 0–37)
Albumin: 4 g/dL (ref 3.5–5.2)
Alkaline Phosphatase: 64 U/L (ref 39–117)
Bilirubin, Direct: 0 mg/dL (ref 0.0–0.3)
Total Bilirubin: 0.3 mg/dL (ref 0.2–1.2)
Total Protein: 7.5 g/dL (ref 6.0–8.3)

## 2014-12-28 LAB — SEDIMENTATION RATE: Sed Rate: 11 mm/hr (ref 0–22)

## 2014-12-28 LAB — TSH: TSH: 2.28 u[IU]/mL (ref 0.35–4.50)

## 2014-12-28 MED ORDER — ROSUVASTATIN CALCIUM 10 MG PO TABS: 10.0000 mg | ORAL_TABLET | Freq: Every day | ORAL | Status: DC

## 2014-12-28 NOTE — Addendum Note (Signed)
Addended by: Anselm JunglingBONYUN-DEBOURGH, Deontrae Drinkard M on: 12/28/2014 04:56 PM   Modules accepted: Orders

## 2014-12-28 NOTE — Addendum Note (Signed)
Addended by: Corwin LevinsJOHN, JAMES W on: 12/28/2014 04:39 PM   Modules accepted: Orders, SmartSet

## 2014-12-28 NOTE — Assessment & Plan Note (Signed)
Mild uncontrolled, has hx of cad by CT, goal ldl < 70 - for crestor 10 qd,  to f/u any worsening symptoms or concerns

## 2014-12-28 NOTE — Assessment & Plan Note (Signed)
Also for rheum labs/sjogrens,  to f/u any worsening symptoms or concerns

## 2014-12-28 NOTE — Assessment & Plan Note (Signed)
stable overall by history and exam, recent data reviewed with pt, and pt to continue medical treatment as before,  to f/u any worsening symptoms or concerns Lab Results  Component Value Date   WBC 7.8 12/20/2013   HGB 13.4 12/20/2013   HCT 41.3 12/20/2013   PLT 373.0 12/20/2013   GLUCOSE 92 12/20/2013   CHOL 182 12/20/2013   TRIG 85.0 12/20/2013   HDL 51.90 12/20/2013   LDLDIRECT 146.3 10/07/2011   LDLCALC 113* 12/20/2013   ALT 13 12/20/2013   AST 19 12/20/2013   NA 138 12/20/2013   K 4.2 12/20/2013   CL 101 12/20/2013   CREATININE 0.7 12/20/2013   BUN 15 12/20/2013   CO2 27 12/20/2013   TSH 2.07 12/20/2013   INR 0.96 05/27/2013

## 2014-12-28 NOTE — Progress Notes (Signed)
Subjective:    Patient ID: Michelle Hubbard, female    DOB: 12-Nov-1945, 69 y.o.   MRN: [id number]  HPI  Here for yearly f/u;  Overall doing ok;  Pt denies Chest pain, worsening SOB, DOE, wheezing, orthopnea, PND, worsening LE edema, palpitations, dizziness or syncope.  Pt denies neurological change such as new headache, facial or extremity weakness.  Pt denies polydipsia, polyuria, or low sugar symptoms. Pt states overall good compliance with treatment and medications, good tolerability, and has been trying to follow appropriate diet.  Pt denies worsening depressive symptoms, suicidal ideation or panic. No fever, night sweats, wt loss, loss of appetite, or other constitutional symptoms.  Pt states good ability with ADL's, has low fall risk, home safety reviewed and adequate, no other significant changes in hearing or vision, and only occasionally active with exercise. Pt continues to have recurring LBP without change in severity, bowel or bladder change, fever, wt loss,  worsening LE pain/numbness/weakness, gait change or falls.  Daughter with sjogrens, pt asks for labs  Has dry mouth and 7 recent cavities at one time. No other compalints Pt continues to have recurring LBP without change in severity, bowel or bladder change, fever, wt loss,  worsening LE pain/numbness/weakness, gait change or falls. Denies worsening depressive symptoms, suicidal ideation, or panic Past Medical History  Diagnosis Date  . DEPRESSIVE DISORDER 12/23/2006  . SINUSITIS- ACUTE-NOS 07/09/2007  . LOW BACK PAIN 12/23/2006  . HYPERLIPIDEMIA 01/08/2010  . 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 COPD 01/08/2010  . OSTEOPOROSIS 01/08/2010  . FATIGUE 01/08/2010  . Vitamin D deficiency 04/30/2010  . OAB (overactive bladder) 10/07/2011  . PONV (postoperative nausea and vomiting)    Past Surgical History  Procedure Laterality Date  . Breast biopsy  1986  . Oophorectomy  bilat 1988    secondary to cyst  . Abdominal  hysterectomy    . Colon surgery  2000    colon obstruction  . Appendectomy  1988  . Colon resection N/A 06/03/2013    Procedure: LAPAROSCOPY DIAGNOSTIC and OPEN  SIGMOID COLON RESECTION ;  Surgeon: Axel FillerArmando Ramirez, MD;  Location: [facility];  Service: General;  Laterality: N/A;    reports that she has been smoking Cigarettes.  She has a 44 pack-year smoking history. She does not have any smokeless tobacco history on file. She reports that she drinks alcohol. She reports that she does not use illicit drugs. family history includes Colon cancer in her brother and mother; Diabetes in her maternal aunt and maternal grandmother. Allergies  Allergen Reactions  . Codeine     REACTION: nausea  . Penicillins    Current Outpatient Prescriptions on File Prior to Visit  Medication Sig Dispense Refill  . aspirin 325 MG tablet Take 325 mg by mouth daily.    . Probiotic Product (ALIGN) 4 MG CAPS Take 1 capsule by mouth daily.    . temazepam (RESTORIL) 15 MG capsule TAKE ONE TO TWO CAPSULES BY MOUTH AT BEDTIME AS NEEDED FOR SLEEP 60 capsule 2  . triamcinolone (NASACORT ALLERGY 24HR) 55 MCG/ACT AERO nasal inhaler Place 2 sprays into the nose daily.    Marland Kitchen. buPROPion (WELLBUTRIN SR) 150 MG 12 hr tablet Take 1 tablet (150 mg total) by mouth 2 (two) times daily. (Patient not taking: Reported on 12/28/2014) 60 tablet 5  . cetirizine (ZYRTEC ALLERGY) 10 MG tablet Take 10 mg by mouth daily.     No current facility-administered medications on file prior to visit.  Review of Systems Constitutional: Negative for increased diaphoresis, other activity, appetite or siginficant weight change other than noted HENT: Negative for worsening hearing loss, ear pain, facial swelling, mouth sores and neck stiffness.   Eyes: Negative for other worsening pain, redness or visual disturbance.  Respiratory: Negative for shortness of breath and wheezing  Cardiovascular: Negative for chest pain and palpitations.  Gastrointestinal:  Negative for diarrhea, blood in stool, abdominal distention or other pain Genitourinary: Negative for hematuria, flank pain or change in urine volume.  Musculoskeletal: Negative for myalgias or other joint complaints.  Skin: Negative for color change and wound or drainage.  Neurological: Negative for syncope and numbness. other than noted Hematological: Negative for adenopathy. or other swelling Psychiatric/Behavioral: Negative for hallucinations, SI, self-injury, decreased concentration or other worsening agitation.      Objective:   Physical Exam BP 130/78 mmHg  Pulse 80  Temp(Src) 98.5 F (36.9 C) (Oral)  Ht  (1.651 m)  Wt 141 lb (63.957 kg)  BMI 23.46 kg/m2  SpO2 95% VS noted, not ill appearing, vigorous, wears hearing aids Constitutional: Pt is oriented to person, place, and time. Appears well-developed and well-nourished, in no significant distress Head: Normocephalic and atraumatic.  Right Ear: External ear normal.  Left Ear: External ear normal.  Nose: Nose normal.  Mouth/Throat: Oropharynx is clear and moist.  Eyes: Conjunctivae and EOM are normal. Pupils are equal, round, and reactive to light.  Neck: Normal range of motion. Neck supple. No JVD present. No tracheal deviation present or significant neck LA or mass Cardiovascular: Normal rate, regular rhythm, normal heart sounds and intact distal pulses.   Pulmonary/Chest: Effort normal and breath sounds without rales or wheezing  Abdominal: Soft. Bowel sounds are normal. NT. No HSM  Musculoskeletal: Normal range of motion. Exhibits no edema.  Lymphadenopathy:  Has no cervical adenopathy.  Neurological: Pt is alert and oriented to person, place, and time. Pt has normal reflexes. No cranial nerve deficit. Motor grossly intact Skin: Skin is warm and dry. No rash noted.  Psychiatric:  Has normal mood and affect. Behavior is normal.     Assessment & Plan:

## 2014-12-28 NOTE — Progress Notes (Signed)
Pre visit review using our clinic review tool, if applicable. No additional management support is needed unless otherwise documented below in the visit note. 

## 2014-12-28 NOTE — Patient Instructions (Signed)
Please take all new medication as prescribed - the crestor  Please continue all other medications as before, and refills have been done if requested.  Please have the pharmacy call with any other refills you may need.  Please continue your efforts at being more active, low cholesterol diet, and weight control.  You are otherwise up to date with prevention measures today.  Please keep your appointments with your specialists as you may have planned  Please go to the LAB in the Basement (turn left off the elevator) for the tests to be done today  You will be contacted by phone if any changes need to be made immediately.  Otherwise, you will receive a letter about your results with an explanation, but please check with MyChart first.  Please remember to sign up for MyChart if you have not done so, as this will be important to you in the future with finding out test results, communicating by private email, and scheduling acute appointments online when needed.  Please return in 1 year for your yearly visit, or sooner if needed

## 2014-12-28 NOTE — Assessment & Plan Note (Signed)
stable overall by history and exam, recent data reviewed with pt, and pt to continue medical treatment as before,  to f/u any worsening symptoms or concerns SpO2 Readings from Last 3 Encounters:  12/28/14 95%  12/20/13 95%  06/13/13 97%

## 2014-12-29 ENCOUNTER — Encounter: Payer: Self-pay | Admitting: Internal Medicine

## 2014-12-29 LAB — HEPATITIS C ANTIBODY: HCV Ab: NEGATIVE

## 2014-12-29 LAB — ANA: Anti Nuclear Antibody(ANA): NEGATIVE

## 2014-12-29 MED ORDER — ATORVASTATIN CALCIUM 10 MG PO TABS: 10.0000 mg | ORAL_TABLET | Freq: Every day | ORAL | Status: DC

## 2014-12-29 NOTE — Addendum Note (Signed)
Addended by: Corwin LevinsJOHN, JAMES W on: 12/29/2014 04:53 PM   Modules accepted: Orders, Medications

## 2015-01-08 ENCOUNTER — Ambulatory Visit: Payer: No Typology Code available for payment source

## 2015-01-15 ENCOUNTER — Ambulatory Visit: Payer: No Typology Code available for payment source

## 2015-02-14 ENCOUNTER — Ambulatory Visit
Admission: RE | Admit: 2015-02-14 | Discharge: 2015-02-14 | Disposition: A | Discharge status: Home / Self Care | Payer: Medicare Other | Source: Ambulatory Visit

## 2015-02-14 DIAGNOSIS — Z1231 Encounter for screening mammogram for malignant neoplasm of breast: Secondary | ICD-10-CM

## 2015-03-08 ENCOUNTER — Other Ambulatory Visit: Payer: Self-pay | Admitting: Internal Medicine

## 2015-03-09 ENCOUNTER — Telehealth: Payer: Self-pay | Admitting: Internal Medicine

## 2015-03-09 ENCOUNTER — Other Ambulatory Visit: Payer: Self-pay

## 2015-03-09 ENCOUNTER — Encounter: Payer: Self-pay | Admitting: Internal Medicine

## 2015-03-09 MED ORDER — TEMAZEPAM 15 MG PO CAPS: ORAL_CAPSULE | ORAL | Status: DC

## 2015-03-09 NOTE — Telephone Encounter (Signed)
Done hardcopy to Corinne  

## 2015-03-09 NOTE — Telephone Encounter (Signed)
Pharmacy called in and needs to know if you can resend the tamazapam     [facility] in [place]

## 2015-03-09 NOTE — Telephone Encounter (Signed)
Please advise pharmacy needs new prescription for tamazepam

## 2015-03-09 NOTE — Telephone Encounter (Signed)
rx faxed

## 2015-03-09 NOTE — Telephone Encounter (Signed)
Too soon for restoril since just done yesterday

## 2015-05-09 ENCOUNTER — Other Ambulatory Visit: Payer: Self-pay | Admitting: Internal Medicine

## 2015-05-10 NOTE — Telephone Encounter (Signed)
Done hardcopy to Corinne  

## 2015-05-10 NOTE — Telephone Encounter (Signed)
Rx faxed to pharmacy  

## 2015-07-04 ENCOUNTER — Other Ambulatory Visit: Payer: Medicare Other

## 2015-07-04 ENCOUNTER — Ambulatory Visit (INDEPENDENT_AMBULATORY_CARE_PROVIDER_SITE_OTHER): Payer: Medicare Other | Admitting: Family

## 2015-07-04 ENCOUNTER — Encounter: Payer: Self-pay | Admitting: Family

## 2015-07-04 VITALS — BP 160/88 | HR 83 | Temp 97.9°F | Resp 16 | Ht 65.0 in | Wt 141.0 lb

## 2015-07-04 DIAGNOSIS — R300 Dysuria: Secondary | ICD-10-CM

## 2015-07-04 LAB — POCT URINALYSIS DIPSTICK
Bilirubin, UA: NEGATIVE
Blood, UA: NEGATIVE
Glucose, UA: NEGATIVE
Ketones, UA: NEGATIVE
Leukocytes, UA: NEGATIVE
Nitrite, UA: NEGATIVE
Protein, UA: NEGATIVE
Spec Grav, UA: 1.015
Urobilinogen, UA: NEGATIVE
pH, UA: 6.5

## 2015-07-04 MED ORDER — SULFAMETHOXAZOLE-TRIMETHOPRIM 800-160 MG PO TABS: 1.0000 | ORAL_TABLET | Freq: Two times a day (BID) | ORAL | Status: DC

## 2015-07-04 NOTE — Assessment & Plan Note (Signed)
In office urinalysis is negative for nitrites, leukocytes, and hematuria. Symptoms and exam consistent with urinary tract infection. Urine sent for culture. Treat prophylactically with Bactrim. Follow-up if symptoms worsen or do not improve.

## 2015-07-04 NOTE — Patient Instructions (Signed)
Thank you for choosing [facility].  Summary/Instructions:  Your prescription(s) have been submitted to your pharmacy or been printed and provided for you. Please take as directed and contact our office if you believe you are having problem(s) with the medication(s) or have any questions.  If your symptoms worsen or fail to improve, please contact our office for further instruction, or in case of emergency go directly to the emergency room at the closest medical facility.   Urinary Tract Infection Urinary tract infections (UTIs) can develop anywhere along your urinary tract. Your urinary tract is your body's drainage system for removing wastes and extra water. Your urinary tract includes two kidneys, two ureters, a bladder, and a urethra. Your kidneys are a pair of bean-shaped organs. Each kidney is about the size of your fist. They are located below your ribs, one on each side of your spine. CAUSES Infections are caused by microbes, which are microscopic organisms, including fungi, viruses, and bacteria. These organisms are so small that they can only be seen through a microscope. Bacteria are the microbes that most commonly cause UTIs. SYMPTOMS  Symptoms of UTIs may vary by age and gender of the patient and by the location of the infection. Symptoms in young women typically include a frequent and intense urge to urinate and a painful, burning feeling in the bladder or urethra during urination. Older women and men are more likely to be tired, shaky, and weak and have muscle aches and abdominal pain. A fever may mean the infection is in your kidneys. Other symptoms of a kidney infection include pain in your back or sides below the ribs, nausea, and vomiting. DIAGNOSIS To diagnose a UTI, your caregiver will ask you about your symptoms. Your caregiver will also ask you to provide a urine sample. The urine sample will be tested for bacteria and white blood cells. White blood cells are made by your  body to help fight infection. TREATMENT  Typically, UTIs can be treated with medication. Because most UTIs are caused by a bacterial infection, they usually can be treated with the use of antibiotics. The choice of antibiotic and length of treatment depend on your symptoms and the type of bacteria causing your infection. HOME CARE INSTRUCTIONS  If you were prescribed antibiotics, take them exactly as your caregiver instructs you. Finish the medication even if you feel better after you have only taken some of the medication.  Drink enough water and fluids to keep your urine clear or pale yellow.  Avoid caffeine, tea, and carbonated beverages. They tend to irritate your bladder.  Empty your bladder often. Avoid holding urine for long periods of time.  Empty your bladder before and after sexual intercourse.  After a bowel movement, women should cleanse from front to back. Use each tissue only once. SEEK MEDICAL CARE IF:   You have back pain.  You develop a fever.  Your symptoms do not begin to resolve within 3 days. SEEK IMMEDIATE MEDICAL CARE IF:   You have severe back pain or lower abdominal pain.  You develop chills.  You have nausea or vomiting.  You have continued burning or discomfort with urination. MAKE SURE YOU:   Understand these instructions.  Will watch your condition.  Will get help right away if you are not doing well or get worse.   This information is not intended to replace advice given to you by your health care provider. Make sure you discuss any questions you have with your health care   provider.   Document Released: 10/23/2004 Document Revised: 10/04/2014 Document Reviewed: 02/21/2011 Elsevier Interactive Patient Education 2016 [employer]  

## 2015-07-04 NOTE — Progress Notes (Signed)
Subjective:    Patient ID: Michelle SkainsNancy K Hubbard, female    DOB: 1945/12/17, 70 y.o.   MRN: [id number]  Chief Complaint  Patient presents with  . Back Pain    thought she had a bladder infection went to [facility] and her urine was checked and she was told that it was not a bladder infection, has pressure in lower stomach and left sided back pain    HPI:  Michelle Hubbard is a 70 y.o. female who  has a past medical history of DEPRESSIVE DISORDER (12/23/2006); SINUSITIS- ACUTE-NOS (07/09/2007); LOW BACK PAIN (12/23/2006); HYPERLIPIDEMIA (01/08/2010); Other specified forms of hearing loss (01/08/2010); COPD (01/08/2010); OSTEOPOROSIS (01/08/2010); FATIGUE (01/08/2010); Vitamin D deficiency (04/30/2010); OAB (overactive bladder) (10/07/2011); and PONV (postoperative nausea and vomiting). and presents today for an acute office visit.   This is a new problem. Associated symptom of pressure located in her lower stomach and left flank has been going for about 1 weeks. Recently seen in Urgent Care and found no evidence kidney stones or urinary tract infection. Denies any modifying factors that make it better worse. No recent antibiotics.   Allergies  Allergen Reactions  . Codeine     REACTION: nausea  . Penicillins      Current Outpatient Prescriptions on File Prior to Visit  Medication Sig Dispense Refill  . aspirin 325 MG tablet Take 325 mg by mouth daily.    Marland Kitchen. atorvastatin (LIPITOR) 10 MG tablet Take 1 tablet (10 mg total) by mouth daily. 90 tablet 3  . cetirizine (ZYRTEC ALLERGY) 10 MG tablet Take 10 mg by mouth daily.    . temazepam (RESTORIL) 15 MG capsule TAKE ONE TO TWO CAPSULES BY MOUTH ONCE DAILY AT BEDTIME AS NEEDED 60 capsule 2  . triamcinolone (NASACORT ALLERGY 24HR) 55 MCG/ACT AERO nasal inhaler Place 2 sprays into the nose daily.     No current facility-administered medications on file prior to visit.     Review of Systems  Constitutional: Negative for fever and chills.    Genitourinary: Positive for urgency, frequency and flank pain. Negative for hematuria.      Objective:    BP 160/88 mmHg  Pulse 83  Temp(Src) 97.9 F (36.6 C) (Oral)  Resp 16  Ht 5\' 5"  (1.651 m)  Wt 141 lb (63.957 kg)  BMI 23.46 kg/m2  SpO2 96% Nursing note and vital signs reviewed.  Physical Exam  Constitutional: She is oriented to person, place, and time. She appears well-developed and well-nourished. No distress.  Cardiovascular: Normal rate, regular rhythm, normal heart sounds and intact distal pulses.   Pulmonary/Chest: Effort normal and breath sounds normal.  Abdominal: There is no CVA tenderness.  Neurological: She is alert and oriented to person, place, and time.  Skin: Skin is warm and dry.  Psychiatric: She has a normal mood and affect. Her behavior is normal. Judgment and thought content normal.       Assessment & Plan:   Problem List Items Addressed This Visit      Other   Dysuria - Primary    In office urinalysis is negative for nitrites, leukocytes, and hematuria. Symptoms and exam consistent with urinary tract infection. Urine sent for culture. Treat prophylactically with Bactrim. Follow-up if symptoms worsen or do not improve.      Relevant Medications   sulfamethoxazole-trimethoprim (BACTRIM DS,SEPTRA DS) 800-160 MG tablet   Other Relevant Orders   POCT urinalysis dipstick (Completed)   Urine culture       I have  discontinued Ms. Wilmes's ALIGN and buPROPion. I am also having her start on sulfamethoxazole-trimethoprim. Additionally, I am having her maintain her aspirin, cetirizine, triamcinolone, atorvastatin, and temazepam.   Meds ordered this encounter  Medications  . sulfamethoxazole-trimethoprim (BACTRIM DS,SEPTRA DS) 800-160 MG tablet    Sig: Take 1 tablet by mouth 2 (two) times daily.    Dispense:  10 tablet    Refill:  0    Order Specific Question:  Supervising Provider    Answer:  Hillard Danker A [[id number]]     Follow-up:  Return if symptoms worsen or fail to improve.  Jeanine Luz, FNP

## 2015-07-06 LAB — URINE CULTURE
Colony Count: NO GROWTH
Organism ID, Bacteria: NO GROWTH

## 2015-07-08 ENCOUNTER — Encounter: Payer: Self-pay | Admitting: Family

## 2015-08-08 ENCOUNTER — Other Ambulatory Visit: Payer: Self-pay | Admitting: Internal Medicine

## 2015-08-09 ENCOUNTER — Telehealth: Payer: Self-pay | Admitting: Internal Medicine

## 2015-08-09 MED ORDER — TEMAZEPAM 15 MG PO CAPS: ORAL_CAPSULE | ORAL | Status: DC

## 2015-08-09 NOTE — Telephone Encounter (Signed)
Done hardcopy to Corinne  

## 2015-08-09 NOTE — Telephone Encounter (Signed)
Medication refill sent to pharmacy  

## 2015-08-09 NOTE — Addendum Note (Signed)
Addended by: Corwin LevinsJOHN, Sharan Mcenaney W on: 08/09/2015 02:29 PM   Modules accepted: Orders

## 2015-11-22 ENCOUNTER — Ambulatory Visit: Payer: Medicare Other | Admitting: Internal Medicine

## 2015-11-28 ENCOUNTER — Encounter: Payer: Self-pay | Admitting: Internal Medicine

## 2015-11-28 ENCOUNTER — Ambulatory Visit (INDEPENDENT_AMBULATORY_CARE_PROVIDER_SITE_OTHER): Payer: Medicare Other | Admitting: Internal Medicine

## 2015-11-28 VITALS — BP 130/76 | HR 102 | Temp 98.8°F | Resp 20 | Wt 137.2 lb

## 2015-11-28 DIAGNOSIS — J189 Pneumonia, unspecified organism: Secondary | ICD-10-CM

## 2015-11-28 DIAGNOSIS — Z23 Encounter for immunization: Secondary | ICD-10-CM | POA: Diagnosis not present

## 2015-11-28 DIAGNOSIS — J3089 Other allergic rhinitis: Secondary | ICD-10-CM | POA: Insufficient documentation

## 2015-11-28 DIAGNOSIS — J309 Allergic rhinitis, unspecified: Secondary | ICD-10-CM | POA: Diagnosis not present

## 2015-11-28 DIAGNOSIS — R5383 Other fatigue: Secondary | ICD-10-CM | POA: Diagnosis not present

## 2015-11-28 DIAGNOSIS — J441 Chronic obstructive pulmonary disease with (acute) exacerbation: Secondary | ICD-10-CM

## 2015-11-28 NOTE — Progress Notes (Signed)
Subjective:    Patient ID: Michelle Hubbard, female    DOB: 1945-08-16, 70 y.o.   MRN: [id number]  HPI  Here after recent illness, was travelling in [place], returned woih a fever, went to UC, and dx and tx for clinical pna and copd exac with levauqin/cough med/prednisone. with cxr neg for infiltrate, on Oct 19.  Still c/o fatigue, but no further fever, wheeze, sob and Pt denies chest pain, increased sob or doe, wheezing, orthopnea, PND, increased LE swelling, palpitations, dizziness or syncope, but cough still hacking and dry cough, with fatgue.  Also does have several wks ongoing nasal allergy symptoms with clearish congestion, itch and sneezing, without fever, pain, ST, cough, swelling or wheezing. Asks for allergy referral.  Did aldo have nose bleed and stopped nasacort but willing to restart.  Pt denies new neurological symptoms such as new headache, or facial or extremity weakness or numbness  Overall good compliance with treatment, and good medicine tolerability, but still has not started the lipitor, trying to work on the diet.   Past Medical History:  Diagnosis Date  . COPD 01/08/2010  . DEPRESSIVE DISORDER 12/23/2006  . FATIGUE 01/08/2010  . HYPERLIPIDEMIA 01/08/2010  . LOW BACK PAIN 12/23/2006  . OAB (overactive bladder) 10/07/2011  . OSTEOPOROSIS 01/08/2010  . 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 PONV (postoperative nausea and vomiting)   . SINUSITIS- ACUTE-NOS 07/09/2007  . Vitamin D deficiency 04/30/2010   Past Surgical History:  Procedure Laterality Date  . ABDOMINAL HYSTERECTOMY    . APPENDECTOMY  1988  . BREAST BIOPSY  1986  . COLON RESECTION N/A 06/03/2013   Procedure: LAPAROSCOPY DIAGNOSTIC and OPEN  SIGMOID COLON RESECTION ;  Surgeon: Axel FillerArmando Ramirez, MD;  Location: [facility];  Service: General;  Laterality: N/A;  . COLON SURGERY  2000   colon obstruction  . OOPHORECTOMY  bilat 1988   secondary to cyst    reports that she has been smoking Cigarettes.  She  has a 44.00 pack-year smoking history. She does not have any smokeless tobacco history on file. She reports that she drinks alcohol. She reports that she does not use drugs. family history includes Colon cancer in her brother and mother; Diabetes in her maternal aunt and maternal grandmother. Allergies  Allergen Reactions  . Codeine     REACTION: nausea  . Penicillins    Current Outpatient Prescriptions on File Prior to Visit  Medication Sig Dispense Refill  . aspirin 325 MG tablet Take 325 mg by mouth daily.    Marland Kitchen. atorvastatin (LIPITOR) 10 MG tablet Take 1 tablet (10 mg total) by mouth daily. 90 tablet 3  . cetirizine (ZYRTEC ALLERGY) 10 MG tablet Take 10 mg by mouth daily.    Marland Kitchen. sulfamethoxazole-trimethoprim (BACTRIM DS,SEPTRA DS) 800-160 MG tablet Take 1 tablet by mouth 2 (two) times daily. 10 tablet 0  . temazepam (RESTORIL) 15 MG capsule 1-2 tab by mouth at bedtime as needed 60 capsule 5  . triamcinolone (NASACORT ALLERGY 24HR) 55 MCG/ACT AERO nasal inhaler Place 2 sprays into the nose daily.     No current facility-administered medications on file prior to visit.    Review of Systems  Constitutional: Negative for unusual diaphoresis or night sweats HENT: Negative for ear swelling or discharge Eyes: Negative for worsening visual haziness  Respiratory: Negative for choking and stridor.   Gastrointestinal: Negative for distension or worsening eructation Genitourinary: Negative for retention or change in urine volume.  Musculoskeletal: Negative for other MSK  pain or swelling Skin: Negative for color change and worsening wound Neurological: Negative for tremors and numbness other than noted  Psychiatric/Behavioral: Negative for decreased concentration or agitation other than above   All other system neg per pt    Objective:   Physical Exam BP 130/76   Pulse (!) 102   Temp 98.8 F (37.1 C) (Oral)   Resp 20   Wt 137 lb 4 oz (62.3 kg)   SpO2 96%   BMI 22.84 kg/m  VS noted,    Constitutional: Pt appears in no apparent distress HENT: Head: NCAT.  Right Ear: External ear normal.  Left Ear: External ear normal.  Eyes: . Pupils are equal, round, and reactive to light. Conjunctivae and EOM are normal Bilat tm's with mild erythema.  Max sinus areas non tender.  Pharynx with mild erythema, no exudate Neck: Normal range of motion. Neck supple.  Cardiovascular: Normal rate and regular rhythm.   Pulmonary/Chest: Effort normal and breath sounds decreased without rales or wheezing.  Abd:  Soft, NT, ND, BS Neurological: Pt is alert. Not confused , motor grossly intact Skin: Skin is warm. No rash, no LE edema Psychiatric: Pt behavior is normal. No agitation.  No other signficant exam changes found  Lab Results  Component Value Date   WBC 6.6 12/28/2014   HGB 13.7 12/28/2014   HCT 41.4 12/28/2014   PLT 367.0 12/28/2014   GLUCOSE 94 12/28/2014   CHOL 182 12/28/2014   TRIG 70.0 12/28/2014   HDL 52.90 12/28/2014   LDLDIRECT 146.3 10/07/2011   LDLCALC 115 (H) 12/28/2014   ALT 15 12/28/2014   AST 16 12/28/2014   NA 138 12/28/2014   K 4.0 12/28/2014   CL 104 12/28/2014   CREATININE 0.70 12/28/2014   BUN 15 12/28/2014   CO2 28 12/28/2014   TSH 2.28 12/28/2014   INR 0.96 05/27/2013       Assessment & Plan:

## 2015-11-28 NOTE — Progress Notes (Signed)
Pre visit review using our clinic review tool, if applicable. No additional management support is needed unless otherwise documented below in the visit note. 

## 2015-11-28 NOTE — Patient Instructions (Addendum)
You had the flu shot today  You will be contacted regarding the referral for: allergy  Please continue all other medications as before, and refills have been done if requested.  Please have the pharmacy call with any other refills you may need.  Please keep your appointments with your specialists as you may have planned  Please return in 6 months, or sooner if needed

## 2015-12-03 NOTE — Assessment & Plan Note (Signed)
On mutiple meds, to cont as is for now, asks for referral to allergy

## 2015-12-03 NOTE — Assessment & Plan Note (Signed)
Clinical dx, no cxr done, afeb, VSS, symptoms resolved except for minor non prod cough occas, will hold on further cxr for now

## 2015-12-03 NOTE — Assessment & Plan Note (Signed)
Etiology unclear, Exam otherwise benign, reviewed labs as documented, follow with expectant management

## 2015-12-03 NOTE — Assessment & Plan Note (Signed)
Resolved, no wheezing on exam,  to f/u any worsening symptoms or concerns

## 2016-01-15 ENCOUNTER — Encounter: Payer: Self-pay | Admitting: Allergy & Immunology

## 2016-01-15 ENCOUNTER — Ambulatory Visit (INDEPENDENT_AMBULATORY_CARE_PROVIDER_SITE_OTHER): Payer: Medicare Other | Admitting: Allergy & Immunology

## 2016-01-15 VITALS — BP 122/72 | HR 98 | Temp 97.9°F | Resp 26 | Ht 64.57 in | Wt 137.0 lb

## 2016-01-15 DIAGNOSIS — T781XXD Other adverse food reactions, not elsewhere classified, subsequent encounter: Secondary | ICD-10-CM | POA: Diagnosis not present

## 2016-01-15 DIAGNOSIS — J449 Chronic obstructive pulmonary disease, unspecified: Secondary | ICD-10-CM | POA: Diagnosis not present

## 2016-01-15 DIAGNOSIS — J4489 Other specified chronic obstructive pulmonary disease: Secondary | ICD-10-CM

## 2016-01-15 DIAGNOSIS — T7819XA Other adverse food reactions, not elsewhere classified, initial encounter: Secondary | ICD-10-CM | POA: Insufficient documentation

## 2016-01-15 DIAGNOSIS — J3089 Other allergic rhinitis: Secondary | ICD-10-CM

## 2016-01-15 DIAGNOSIS — T781XXA Other adverse food reactions, not elsewhere classified, initial encounter: Secondary | ICD-10-CM | POA: Insufficient documentation

## 2016-01-15 DIAGNOSIS — R918 Other nonspecific abnormal finding of lung field: Secondary | ICD-10-CM | POA: Insufficient documentation

## 2016-01-15 HISTORY — DX: Other specified chronic obstructive pulmonary disease: J44.89

## 2016-01-15 HISTORY — DX: Chronic obstructive pulmonary disease, unspecified: J44.9

## 2016-01-15 MED ORDER — ALBUTEROL SULFATE HFA 108 (90 BASE) MCG/ACT IN AERS: 2.0000 | INHALATION_SPRAY | RESPIRATORY_TRACT | 3 refills | Status: DC | PRN

## 2016-01-15 MED ORDER — FLUTICASONE-SALMETEROL 230-21 MCG/ACT IN AERO: 2.0000 | INHALATION_SPRAY | Freq: Two times a day (BID) | RESPIRATORY_TRACT | 5 refills | Status: DC

## 2016-01-15 MED ORDER — EPINEPHRINE 0.3 MG/0.3ML IJ SOAJ: 0.3000 mg | Freq: Once | INTRAMUSCULAR | 2 refills | Status: DC

## 2016-01-15 MED ORDER — AZELASTINE HCL 0.1 % NA SOLN: 2.0000 | Freq: Every day | NASAL | 5 refills | Status: DC

## 2016-01-15 MED ORDER — FLUTICASONE PROPIONATE 50 MCG/ACT NA SUSP: 2.0000 | Freq: Every day | NASAL | 5 refills | Status: DC

## 2016-01-15 MED ORDER — BUDESONIDE-FORMOTEROL FUMARATE 160-4.5 MCG/ACT IN AERO: INHALATION_SPRAY | RESPIRATORY_TRACT | 5 refills | Status: DC

## 2016-01-15 MED ORDER — AZELASTINE-FLUTICASONE 137-50 MCG/ACT NA SUSP: NASAL | 5 refills | Status: DC

## 2016-01-15 MED ORDER — LEVOCETIRIZINE DIHYDROCHLORIDE 5 MG PO TABS: 5.0000 mg | ORAL_TABLET | Freq: Every evening | ORAL | 5 refills | Status: DC

## 2016-01-15 NOTE — Progress Notes (Signed)
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  - To help control your symptoms, I recommend starting Symbicort 160/4.5 two puffs in the morning and two puffs at night. - Continue with annual chest CT for monitoring of pulmonary nodules.  - Daily controller medication(s): Symbicort 160/4.5 two puffs twice daily with spacer - Rescue medications: ProAir 4 puffs every 4-6 hours as needed or albuterol nebulizer one vial puffs every 4-6 hours as needed - Asthma control goals:  * Full participation in all desired activities (may need albuterol before activity) * Albuterol use two time or less a week on average (not counting use with activity) * Cough interfering with sleep two time or less a month * Oral steroids no more than once a year * No hospitalizations   2. Adverse food reaction (peanuts/tree nuts) - Testing today showed: negative to the most common food allergens, including peanuts and cashews - Food allergy testing has good negative predictive value, therefore there is no need to avoid any of these foods.   3. Chronic rhinitis - Testing today showed: positives to ragweed, weeds, molds, cat, cockroach, and dust mite. - Stop Flonase and start Dymista 1-2 sprays per nostril once daily.  - You can increase Dymista up to twice daily if there is no improvement.  - Stop Zyrtec and start Xyzal 5mg  tablet once daily.  - After considering allergy shots, she decided that she is not interested in them at this time.   4.  Return in about 2 years (around 01/14/2018).    Subjective:   Michelle Hubbard is a 70 y.o. female presenting today for evaluation of  Chief Complaint  Patient presents with  . Allergies    want to see what she can do with her allergies- interested in allergy shots  . Nasal Congestion    all the time, with drainage  . Allergy Testing    need to find out what she had allergies to  .  Michelle Hubbard has a history of the following: Patient Active Problem List   Diagnosis Date Noted  . Pulmonary nodules 01/15/2016  . 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 Adverse food reaction 01/15/2016  . CAP (community acquired pneumonia) 11/28/2015  . COPD exacerbation (HCC) 11/28/2015  . 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/28/2015  . Dysuria 07/04/2015  . Dry mouth 12/28/2014  . Bilateral hearing loss 12/20/2013  . 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  . OAB (overactive bladder) 10/07/2011  . Preventative health care 10/07/2011  . Smoker 10/07/2011  . Chronic insomnia 08/15/2011  . Vitamin D deficiency 04/30/2010  . Hyperlipidemia 01/08/2010  . 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 COPD (chronic obstructive pulmonary disease) (HCC) 01/08/2010  . OSTEOPOROSIS 01/08/2010  . Fatigue 01/08/2010  . Depression 12/23/2006  . LOW BACK PAIN 12/23/2006    History obtained from: chart review and patient.  Michelle Hubbard was referred by Oliver Barre, MD.     Michelle Hubbard is a 70 y.o. female presenting for evaluation of chronic nasal congestion. She  reports that she has nasal congestion throughout the entire year. This has been ongoing for several years. She uses Nasacort two sprays per nostril at night and cetirizine in the morning. She has been doing this for around two years without improvement. In fact, since she stopped taking them in anticipation of the appointment, she has not felt differently at lal. She does use nasal saline rinses three  times per day as well. She uses the Neilmed bottle. She takes Mucinex as needed. Triggers includes scents and candle shops. Working out in the yard makes things worse, but she does wear a mask.   She is followed by ENT in [place] (Dr. Jacqulyn Bath), whom she has followed for 25 years. She does have a deviated septum but does not want to have surgery at the [facility], therefore she has not done the surgery yet. There has been no sinus CT. She has had no surgeries.  Michelle Hubbard has had seven episodes of pneumonia over the years. These are all chest X-ray diagnosed per the patient. Her last episode of pneumonia was around two years ago. She does have COPD. She is not on anything at this time. She has never bene on any inhalers. She did go see a Diplomatic Services operational officer in [place] 5-6 years ago. She was placed on a nebulizer machine at that time but she has not used it in several years. She estimates that she wakes up coughing several times per week. She uses Vicks more nights than not and uses a device to keep her nostrils open.   Michelle Hubbard also has a history of pulmonary nodules. She receives regular low dose chest CTs to monitor. Her last one was done in December 2015. Per the report, she has RADS-Category 2 with benign appearance. Chest CT also noted mild diffuse bronchial wall thickening with mild to moderate centrilobular emphysema.   Michelle Hubbard is a smoker and is down to one pack per day. She has quit twice before with Wellbutrin. This is being managed by her PCP. She develops nausea to this. She has tried using Chantix but becomes depressed. Her husband passed away nine years ago which is what triggered her to re-start. Her son developed Guillain-Barre syndrome which was another trigger.   Otherwise, there is no history of other atopic diseases, including asthma, drug allergies, food allergies, environmental allergies, stinging insect allergies, or urticaria. There is no significant infectious  history. Vaccinations are up to date.   Chest CT (December 2015): IMPRESSION: 1. Lung-RADS Category 2, benign appearance or behavior. Continue annual screening with low-dose chest CT without contrast in 12 months. 2. Mild diffuse bronchial wall thickening with mild to moderate centrilobular and paraseptal emphysema; imaging findings suggestive of underlying COPD. Counseling for smoking cessation is strongly recommended. 3. Atherosclerosis, including left main and 3 vessel coronary artery disease. Please note that although the presence of coronary artery calcium documents the presence of coronary artery disease, the severity of this disease and any potential stenosis cannot be assessed on this non-gated CT examination. Assessment for potential risk factor modification, dietary therapy or pharmacologic therapy may be warranted, if clinically indicated. 4. Colonic diverticulosis.    Past Medical History: Patient Active Problem List   Diagnosis Date Noted  . Pulmonary nodules 01/15/2016  . 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 Adverse food reaction 01/15/2016  . CAP (community acquired pneumonia) 11/28/2015  . COPD exacerbation (HCC) 11/28/2015  . 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/28/2015  . Dysuria 07/04/2015  .  Dry mouth 12/28/2014  . Bilateral hearing loss 12/20/2013  . 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  . OAB (overactive bladder) 10/07/2011  . Preventative health care 10/07/2011  . Smoker 10/07/2011  . Chronic insomnia 08/15/2011  . Vitamin D deficiency 04/30/2010  . Hyperlipidemia 01/08/2010  . 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 COPD (chronic obstructive pulmonary disease) (HCC) 01/08/2010  . OSTEOPOROSIS 01/08/2010  . Fatigue 01/08/2010  . Depression 12/23/2006  . LOW BACK PAIN 12/23/2006    Medication List:  Allergies as of 01/15/2016      Reactions   Codeine    REACTION: nausea   Lortab  [hydrocodone-acetaminophen] Hives   Penicillins       Medication List       Accurate as of 01/15/16  3:25 PM. Always use your most recent med list.          ALIGN 4 MG Caps Take 1 capsule by mouth daily.   antiseptic oral rinse Liqd 15 mLs by Mouth Rinse route as needed for dry mouth.   aspirin 325 MG tablet Take 325 mg by mouth daily.   atorvastatin 10 MG tablet Commonly known as:  LIPITOR Take 1 tablet (10 mg total) by mouth daily.   levocetirizine 5 MG tablet Commonly known as:  XYZAL Take 1 tablet (5 mg total) by mouth every evening.   NASACORT ALLERGY 24HR 55 MCG/ACT Aero nasal inhaler Generic drug:  triamcinolone Place 2 sprays into the nose daily.   Saline 0.65 % (Soln) Soln Place 1 spray into the nose as needed.   SYSTANE ULTRA 0.4-0.3 % Soln Generic drug:  Polyethyl Glycol-Propyl Glycol Apply 1 drop to eye 4 (four) times daily as needed.   temazepam 15 MG capsule Commonly known as:  RESTORIL 1-2 tab by mouth at bedtime as needed   ZYRTEC ALLERGY 10 MG tablet Generic drug:  cetirizine Take 10 mg by mouth daily.       Birth History: non-contributory.   Developmental History: non-contributory.  Past Surgical History: Past Surgical History:  Procedure Laterality Date  . ABDOMINAL HYSTERECTOMY    . APPENDECTOMY  1988  . BREAST BIOPSY  1986  . COLON RESECTION N/A 06/03/2013   Procedure: LAPAROSCOPY DIAGNOSTIC and OPEN  SIGMOID COLON RESECTION ;  Surgeon: Axel FillerArmando Ramirez, MD;  Location: [facility];  Service: General;  Laterality: N/A;  . COLON SURGERY  2000   colon obstruction  . OOPHORECTOMY  bilat 1988   secondary to cyst     Family History: Family History  Problem Relation Age of Onset  . Colon cancer Mother   . Colon cancer Brother   . Diabetes Maternal Aunt   . Diabetes Maternal Grandmother   . Allergic rhinitis Neg Hx   . Angioedema Neg Hx   . Asthma Neg Hx   . Eczema Neg Hx   . Immunodeficiency Neg Hx   . Urticaria Neg Hx       Social History: Michelle Hubbard lives at home by herself. 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 Her husband passed away nine years ago. She lives in a home that is 72 years old. There is carpeting throughout the home. She has gas heating and central cooling. There are no animals inside or outside of the home. She does not have dust mite covers on the bedding.    Review of Systems: a 14-point review of systems is pertinent for what  is mentioned in HPI.  Otherwise, all other systems were negative. Constitutional: negative other than that listed in the HPI Eyes: negative other than that listed in the HPI Ears, nose, mouth, throat, and face: negative other than that listed in the HPI Respiratory: negative other than that listed in the HPI Cardiovascular: negative other than that listed in the HPI Gastrointestinal: negative other than that listed in the HPI Genitourinary: negative other than that listed in the HPI Integument: negative other than that listed in the HPI Hematologic: negative other than that listed in the HPI Musculoskeletal: negative other than that listed in the HPI Neurological: negative other than that listed in the HPI Allergy/Immunologic: negative other than that listed in the HPI    Objective:   Blood pressure 122/72, pulse 98, temperature 97.9 F (36.6 C), temperature source Oral, resp. rate (!) 26, height 5' 4.57" (1.64 m), weight 137 lb (62.1 kg), SpO2 96 %. Body mass index is 23.1 kg/m.   Physical Exam:  General: Alert, interactive, in no acute distress. Very pleasant female. Talkative.  Eyes: No conjunctival injection present on the right, No conjunctival injection present on the left, PERRL bilaterally, No discharge on the right, No discharge on the left and No Horner-Trantas dots present Ears: Right TM pearly gray with normal light reflex, Left TM pearly gray with normal light reflex,  Right TM intact without perforation and Left TM intact without perforation.  Nose/Throat: External nose within normal limits, nasal crease present and septum midline, turbinates edematous and pale with clear discharge, post-pharynx erythematous without cobblestoning in the posterior oropharynx. Tonsils 2+ without exudates Neck: Supple without thyromegaly. Adenopathy: no enlarged lymph nodes appreciated in the anterior cervical, occipital, axillary, epitrochlear, inguinal, or popliteal regions Lungs: Decreased breath sounds bilaterally without wheezing, rhonchi or rales. No increased work of breathing. CV: Normal S1/S2, no murmurs. Capillary refill <2 seconds.  Abdomen: Nondistended, nontender. No guarding or rebound tenderness. Bowel sounds present in all fields and hypoactive  Skin: Warm and dry, without lesions or rashes. Extremities:  No clubbing, cyanosis or edema. Neuro:   Grossly intact. No focal deficits appreciated. Responsive to questions.  Diagnostic studies:  Spirometry: results abnormal (FEV1: 1.52/72%, FVC: 2.54/100%, FEV1/FVC: 59%).    Spirometry consistent with moderate obstructive disease. Albuterol nebulizer treatment given in clinic with improvement in the FVC, FEV1, and FEF25-75% although the values did not reach significance.  Allergy Studies:   Indoor/Outdoor Percutaneous Adult Environmental Panel: negative to entire panel with adequate controls.  Indoor/Outdoor Selected Intradermal Environmental Panel: positive to ragweed mix, tree mix, mold mix #1, mold mix #3, cat, cockroach and mite mix. Otherwise negative with adequate controls.  Most Common Foods Panel (peanut, tree nut, soy, fish mix, shellfish mix, wheat, milk, egg): negative to entire panel with adequate controls.     Malachi BondsJoel Samari Gorby, MD FAAAAI [facility]

## 2016-01-15 NOTE — Patient Instructions (Addendum)
1. 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  - To help control your symptoms, I recommend starting Symbicort 160/4.5 two puffs in the morning and two puffs at night. - Continue with annual chest CT for monitoring of pulmonary nodules.  - Daily controller medication(s): Symbicort 160/4.5 two puffs twice daily with spacer - Rescue medications: ProAir 4 puffs every 4-6 hours as needed or albuterol nebulizer one vial puffs every 4-6 hours as needed - Asthma control goals:  * Full participation in all desired activities (may need albuterol before activity) * Albuterol use two time or less a week on average (not counting use with activity) * Cough interfering with sleep two time or less a month * Oral steroids no more than once a year * No hospitalizations  2. Adverse food reaction (peanuts/tree nuts) - Testing today showed: negative to the most common food allergens, including peanuts and cashews - Food allergy testing has good negative predictive value, therefore there is no need to avoid any of these foods.   3. Chronic rhinitis - Testing today showed: positives to ragweed, weeds, molds, cat, cockroach, and dust mite. - Stop Flonase and start Dymista 1-2 sprays per nostril once daily.  - You can increase Dymista up to twice daily if there is no improvement.  - Stop Zyrtec and start Xyzal 5mg  tablet once daily.  - We will get your allergy shots mixed up and you can make an appointment to start in two weeks.   4. Return in about 2 years (around 01/14/2018).  Please inform us of any Emergency Department visits, hospitalizations, or changes in symptoms. Call us before going to the ED for breathing or allergy symptoms since we might be able to fit you in for a sick visit. Feel free to contact us anytime with any questions, problems, or concerns.  It was a pleasure to meet you today! Have a wonderful holiday season!    Websites that have reliable patient information: 1. American Academy of Asthma, Allergy, and Immunology: www.aaaai.org 2. Food Allergy Research and Education (FARE): foodallergy.org 3. Mothers of Asthmatics: http://www.asthmacommunitynetwork.org 4. American College of Allergy, Asthma, and Immunology: www.acaai.org

## 2016-02-05 ENCOUNTER — Ambulatory Visit: Payer: Medicare Other

## 2016-02-06 ENCOUNTER — Encounter: Payer: Self-pay | Admitting: Internal Medicine

## 2016-02-06 MED ORDER — TEMAZEPAM 15 MG PO CAPS: ORAL_CAPSULE | ORAL | 5 refills | Status: DC

## 2016-02-06 NOTE — Telephone Encounter (Signed)
Done hardcopy to Corinne  

## 2016-06-19 ENCOUNTER — Ambulatory Visit (INDEPENDENT_AMBULATORY_CARE_PROVIDER_SITE_OTHER): Payer: Medicare Other | Admitting: *Deleted

## 2016-06-19 ENCOUNTER — Telehealth: Payer: Self-pay | Admitting: *Deleted

## 2016-06-19 VITALS — BP 144/78 | HR 78 | Resp 20 | Ht 65.0 in | Wt 144.0 lb

## 2016-06-19 DIAGNOSIS — Z0000 Encounter for general adult medical examination without abnormal findings: Secondary | ICD-10-CM | POA: Diagnosis not present

## 2016-06-19 NOTE — Progress Notes (Signed)
Pre visit review using our clinic review tool, if applicable. No additional management support is needed unless otherwise documented below in the visit note. 

## 2016-06-19 NOTE — Telephone Encounter (Signed)
Please consider OV 

## 2016-06-19 NOTE — Progress Notes (Addendum)
Subjective:   Michelle Hubbard is a 71 y.o. female who presents for Medicare Annual (Subsequent) preventive examination.  Review of Systems:  No ROS.  Medicare Wellness Visit.  Cardiac Risk Factors include: advanced age (>62men, >59 women);dyslipidemia Sleep patterns: has frequent nighttime awakenings, gets up 1-3 times nightly to void and sleeps 5-6 hours nightly. Patient reports long-term insomnia issues, discussed recommended sleep tips and stress reduction tips, education was attached to patient's AVS.  Home Safety/Smoke Alarms: Feels safe in home. Smoke alarms in place.  Living environment; residence and Firearm Safety: 2-story house, no firearms. Lives alone, no needs for DME, good family and friend support Seat Belt Safety/Bike Helmet: Wears seat belt.   Counseling:   Eye Exam- appointment yearly  Dental- appointment every 6 months  Female:   Pap- N/A      Mammo- last 02/14/15, BI-RADS CATEGORY  1: Negative, declines referral stating that she is going to start to do exam every 2 years      Dexa scan- last 08/04/10, declined refferal     CCS- last 02/06/12, recall 5 years     Objective:     Vitals: BP (!) 144/78   Pulse 78   Resp 20   Ht 5\' 5"  (1.651 m)   Wt 144 lb (65.3 kg)   SpO2 99%   BMI 23.96 kg/m   Body mass index is 23.96 kg/m.   Tobacco History  Smoking Status  . Current Every Day Smoker  . Packs/day: 1.00  . Years: 44.00  . Types: Cigarettes  Smokeless Tobacco  . Never Used    Comment: smoking more due to move/ trying to quit     Ready to quit: Not Answered Counseling given: Not Answered   Past Medical History:  Diagnosis Date  . 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 COPD 01/08/2010  . DEPRESSIVE DISORDER 12/23/2006  . FATIGUE 01/08/2010  . HYPERLIPIDEMIA 01/08/2010  . LOW BACK PAIN 12/23/2006  . OAB (overactive bladder) 10/07/2011  . OSTEOPOROSIS 01/08/2010  . 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 PONV  (postoperative nausea and vomiting)   . SINUSITIS- ACUTE-NOS 07/09/2007  . Urticaria   . Vitamin D deficiency 04/30/2010   Past Surgical History:  Procedure Laterality Date  . ABDOMINAL HYSTERECTOMY    . APPENDECTOMY  1988  . BREAST BIOPSY  1986  . COLON RESECTION N/A 06/03/2013   Procedure: LAPAROSCOPY DIAGNOSTIC and OPEN  SIGMOID COLON RESECTION ;  Surgeon: Axel Filler, MD;  Location: [facility];  Service: General;  Laterality: N/A;  . COLON SURGERY  2000   colon obstruction  . larygenscopy    . OOPHORECTOMY  bilat 1988   secondary to cyst   Family History  Problem Relation Age of Onset  . Colon cancer Mother   . Colon cancer Brother   . Diabetes Maternal Aunt   . Diabetes Maternal Grandmother   . Allergic rhinitis Neg Hx   . Angioedema Neg Hx   . Asthma Neg Hx   . Eczema Neg Hx   . Immunodeficiency Neg Hx   . Urticaria Neg Hx    History  Sexual Activity  . Sexual activity: Not Currently    Outpatient Encounter Prescriptions as of 06/19/2016  Medication Sig  . antiseptic oral rinse (BIOTENE) LIQD 15 mLs by Mouth Rinse route as needed for dry mouth.  Marland Kitchen aspirin 325 MG tablet Take 325 mg by mouth daily.  . Flaxseed, Linseed, (EQL FLAX SEED OIL PO) Take 5  mLs by mouth.  . levocetirizine (XYZAL) 5 MG tablet Take 5 mg by mouth every evening.  Bertram Gala Glycol-Propyl Glycol (SYSTANE ULTRA) 0.4-0.3 % SOLN Apply 1 drop to eye 4 (four) times daily as needed.  . Saline 0.65 % (Soln) SOLN Place 1 spray into the nose as needed.  . temazepam (RESTORIL) 15 MG capsule 1-2 tab by mouth at bedtime as needed  . triamcinolone (NASACORT ALLERGY 24HR) 55 MCG/ACT AERO nasal inhaler Place 2 sprays into the nose daily.  . [DISCONTINUED] azelastine (ASTELIN) 0.1 % nasal spray Place 2 sprays into both nostrils daily.  . [DISCONTINUED] Probiotic Product (ALIGN) 4 MG CAPS Take 1 capsule by mouth daily.  . [DISCONTINUED] atorvastatin (LIPITOR) 10 MG tablet Take 1 tablet (10 mg total) by mouth daily.  (Patient not taking: Reported on 01/15/2016)  . [DISCONTINUED] cetirizine (ZYRTEC ALLERGY) 10 MG tablet Take 10 mg by mouth daily.  . [DISCONTINUED] fluticasone (FLONASE) 50 MCG/ACT nasal spray Place 2 sprays into both nostrils daily. (Patient not taking: Reported on 06/19/2016)  . [DISCONTINUED] fluticasone-salmeterol (ADVAIR HFA) 230-21 MCG/ACT inhaler Inhale 2 puffs into the lungs 2 (two) times daily. (Patient not taking: Reported on 06/19/2016)  . [DISCONTINUED] levocetirizine (XYZAL) 5 MG tablet Take 1 tablet (5 mg total) by mouth every evening. (Patient not taking: Reported on 06/19/2016)   No facility-administered encounter medications on file as of 06/19/2016.     Activities of Daily Living In your present state of health, do you have any difficulty performing the following activities: 06/19/2016  Hearing? N  Vision? N  Difficulty concentrating or making decisions? N  Walking or climbing stairs? N  Dressing or bathing? N  Doing errands, shopping? N  Preparing Food and eating ? N  Using the Toilet? N  In the past six months, have you accidently leaked urine? N  Do you have problems with loss of bowel control? N  Managing your Medications? N  Managing your Finances? N  Housekeeping or managing your Housekeeping? N  Some recent data might be hidden    Patient Care Team: Corwin Levins, MD as PCP - General    Assessment:    Physical assessment deferred to PCP.  Exercise Activities and Dietary recommendations Current Exercise Habits: Structured exercise class, Type of exercise: strength training/weights;calisthenics, Time (Minutes): 60, Frequency (Times/Week): 3, Weekly Exercise (Minutes/Week): 180, Intensity: Mild, Exercise limited by: None identified  Diet (meal preparation, eat out, water intake, caffeinated beverages, dairy products, fruits and vegetables): in general, a "healthy" diet  , high fiber, low fat/ cholesterol, high salt eats a variety of fruits and vegetables daily,  limits salt, fat/cholesterol, sugar, caffeine, drinks 6-8 glasses of water daily. Diet education was provided via handouts.   Goals    . Maintain current health status          Continue to be active exercise, eat healthy, enjoy life and family    . Quit smoking / using tobacco          Smoking;  Educated to avoid secondary smoke Smoking cessation at [facility]: [phone] Classes offered a couple of times a month;  Will work with the patient as far as registration and location Meds may help; chatix (Varenicline); Zyban (Bupropion SR); Nicotine Replacement (gum; lozenges; patches; etc.) 30 pack yr smoking hx: Educated regarding LDCT; To discuss with MD at next fup.        Fall Risk Fall Risk  06/19/2016 11/28/2014 12/20/2013  Falls in the past year? No No No  Depression Screen PHQ 2/9 Scores 06/19/2016 11/28/2014 12/20/2013  PHQ - 2 Score 0 0 0  PHQ- 9 Score 3 - -     Cognitive Function MMSE - Mini Mental State Exam 11/28/2014  Not completed: (No Data)        Immunization History  Administered Date(s) Administered  . Influenza Split 10/07/2011  . Influenza Whole 01/08/2010  . Influenza, High Dose Seasonal PF 11/28/2014  . Influenza,inj,Quad PF,36+ Mos 12/20/2013, 11/28/2015  . Pneumococcal Conjugate-13 12/28/2014  . Pneumococcal Polysaccharide-23 01/08/2010  . Tdap 10/07/2011   Screening Tests Health Maintenance  Topic Date Due  . INFLUENZA VACCINE  08/27/2016  . COLONOSCOPY  02/05/2017  . MAMMOGRAM  02/13/2017  . TETANUS/TDAP  10/06/2021  . DEXA SCAN  Completed  . Hepatitis C Screening  Completed  . PNA vac Low Risk Adult  Completed      Plan:    Continue to eat heart healthy diet (full of fruits, vegetables, whole grains, lean protein, water--limit salt, fat, and sugar intake) and increase physical activity as tolerated.  Continue doing brain stimulating activities (puzzles, reading, adult coloring books, staying active) to keep memory sharp.   I have  personally reviewed and noted the following in the patient's chart:   . Medical and social history . Use of alcohol, tobacco or illicit drugs  . Current medications and supplements . Functional ability and status . Nutritional status . Physical activity . Advanced directives . List of other physicians . Vitals . Screenings to include cognitive, depression, and falls . Referrals and appointments  In addition, I have reviewed and discussed with patient certain preventive protocols, quality metrics, and best practice recommendations. A written personalized care plan for preventive services as well as general preventive health recommendations were provided to patient.     Wanda PlumpJill A Seanpaul Preece, RN  06/19/2016    Medical screening examination/treatment/procedure(s) were performed by non-physician practitioner and as supervising physician I was immediately available for consultation/collaboration. I agree with above. Oliver BarreJames John, MD

## 2016-06-19 NOTE — Telephone Encounter (Signed)
During AWV, patient stated that temazepam is no longer helping her with her sleep issue. She is requesting to have something else prescribed to help.

## 2016-06-19 NOTE — Patient Instructions (Addendum)
Continue to eat heart healthy diet (full of fruits, vegetables, whole grains, lean protein, water--limit salt, fat, and sugar intake) and increase physical activity as tolerated.  Continue doing brain stimulating activities (puzzles, reading, adult coloring books, staying active) to keep memory sharp.    Michelle Hubbard , Thank you for taking time to come for your Medicare Wellness Visit. I appreciate your ongoing commitment to your health goals. Please review the following plan we discussed and let me know if I can assist you in the future.   These are the goals we discussed: Goals    . Maintain current health status          Continue to be active exercise, eat healthy, enjoy life and family    . Quit smoking / using tobacco          Smoking;  Educated to avoid secondary smoke Smoking cessation at [facility]: [phone] Classes offered a couple of times a month;  Will work with the patient as far as registration and location Meds may help; chatix (Varenicline); Zyban (Bupropion SR); Nicotine Replacement (gum; lozenges; patches; etc.) 30 pack yr smoking hx: Educated regarding LDCT; To discuss with MD at next fup.         This is a list of the screening recommended for you and due dates:  Health Maintenance  Topic Date Due  . Flu Shot  08/27/2016  . Colon Cancer Screening  02/05/2017  . Mammogram  02/13/2017  . Tetanus Vaccine  10/06/2021  . DEXA scan (bone density measurement)  Completed  .  Hepatitis C: One time screening is recommended by Center for Disease Control  (CDC) for  adults born from [birth year] through 1967.   Completed  . Pneumonia vaccines  Completed    Stress and Stress Management Stress is a normal reaction to life events. It is what you feel when life demands more than you are used to or more than you can handle. Some stress can be useful. For example, the stress reaction can help you catch the last bus of the day, study for a test, or meet a deadline at work. But stress  that occurs too often or for too long can cause problems. It can affect your emotional health and interfere with relationships and normal daily activities. Too much stress can weaken your immune system and increase your risk for physical illness. If you already have a medical problem, stress can make it worse. What are the causes? All sorts of life events may cause stress. An event that causes stress for one person may not be stressful for another person. Major life events commonly cause stress. These may be positive or negative. Examples include losing your job, moving into a new home, getting married, having a baby, or losing a loved one. Less obvious life events may also cause stress, especially if they occur day after day or in combination. Examples include working long hours, driving in traffic, caring for children, being in debt, or being in a difficult relationship. What are the signs or symptoms? Stress may cause emotional symptoms including, the following:  Anxiety. This is feeling worried, afraid, on edge, overwhelmed, or out of control.  Anger. This is feeling irritated or impatient.  Depression. This is feeling sad, down, helpless, or guilty.  Difficulty focusing, remembering, or making decisions. Stress may cause physical symptoms, including the following:  Aches and pains. These may affect your head, neck, back, stomach, or other areas of your body.  Tight muscles or  clenched jaw.  Low energy or trouble sleeping. Stress may cause unhealthy behaviors, including the following:  Eating to feel better (overeating) or skipping meals.  Sleeping too little, too much, or both.  Working too much or putting off tasks (procrastination).  Smoking, drinking alcohol, or using drugs to feel better. How is this diagnosed? Stress is diagnosed through an assessment by your health care provider. Your health care provider will ask questions about your symptoms and any stressful life  events.Your health care provider will also ask about your medical history and may order blood tests or other tests. Certain medical conditions and medicine can cause physical symptoms similar to stress. Mental illness can cause emotional symptoms and unhealthy behaviors similar to stress. Your health care provider may refer you to a mental health professional for further evaluation. How is this treated? Stress management is the recommended treatment for stress.The goals of stress management are reducing stressful life events and coping with stress in healthy ways. Techniques for reducing stressful life events include the following:  Stress identification. Self-monitor for stress and identify what causes stress for you. These skills may help you to avoid some stressful events.  Time management. Set your priorities, keep a calendar of events, and learn to say "no." These tools can help you avoid making too many commitments. Techniques for coping with stress include the following:  Rethinking the problem. Try to think realistically about stressful events rather than ignoring them or overreacting. Try to find the positives in a stressful situation rather than focusing on the negatives.  Exercise. Physical exercise can release both physical and emotional tension. The key is to find a form of exercise you enjoy and do it regularly.  Relaxation techniques. These relax the body and mind. Examples include yoga, meditation, tai chi, biofeedback, deep breathing, progressive muscle relaxation, listening to music, being out in nature, journaling, and other hobbies. Again, the key is to find one or more that you enjoy and can do regularly.  Healthy lifestyle. Eat a balanced diet, get plenty of sleep, and do not smoke. Avoid using alcohol or drugs to relax.  Strong support network. Spend time with family, friends, or other people you enjoy being around.Express your feelings and talk things over with someone  you trust. Counseling or talktherapy with a mental health professional may be helpful if you are having difficulty managing stress on your own. Medicine is typically not recommended for the treatment of stress.Talk to your health care provider if you think you need medicine for symptoms of stress. Follow these instructions at home:  Keep all follow-up visits as directed by your health care provider.  Take all medicines as directed by your health care provider. Contact a health care provider if:  Your symptoms get worse or you start having new symptoms.  You feel overwhelmed by your problems and can no longer manage them on your own. Get help right away if:  You feel like hurting yourself or someone else. This information is not intended to replace advice given to you by your health care provider. Make sure you discuss any questions you have with your health care provider. Document Released: 07/09/2000 Document Revised: 06/21/2015 Document Reviewed: 09/07/2012 Elsevier Interactive Patient Education  2017 [employer].

## 2016-06-20 NOTE — Telephone Encounter (Signed)
Called patient to inform that Dr. Jonny RuizJohn asked for her to schedule an appointment to discuss her insomnia. An appointment was scheduled for 06/24/16.

## 2016-06-24 ENCOUNTER — Ambulatory Visit: Payer: Medicare Other | Admitting: Internal Medicine

## 2016-06-26 ENCOUNTER — Ambulatory Visit (INDEPENDENT_AMBULATORY_CARE_PROVIDER_SITE_OTHER): Payer: Medicare Other | Admitting: Internal Medicine

## 2016-06-26 ENCOUNTER — Other Ambulatory Visit (INDEPENDENT_AMBULATORY_CARE_PROVIDER_SITE_OTHER): Payer: Medicare Other

## 2016-06-26 VITALS — BP 138/90 | HR 84 | Ht 65.0 in | Wt 143.0 lb

## 2016-06-26 DIAGNOSIS — J438 Other emphysema: Secondary | ICD-10-CM

## 2016-06-26 DIAGNOSIS — F5104 Psychophysiologic insomnia: Secondary | ICD-10-CM

## 2016-06-26 DIAGNOSIS — E785 Hyperlipidemia, unspecified: Secondary | ICD-10-CM

## 2016-06-26 DIAGNOSIS — M810 Age-related osteoporosis without current pathological fracture: Secondary | ICD-10-CM | POA: Diagnosis not present

## 2016-06-26 DIAGNOSIS — F17200 Nicotine dependence, unspecified, uncomplicated: Secondary | ICD-10-CM | POA: Diagnosis not present

## 2016-06-26 LAB — HEPATIC FUNCTION PANEL
ALT: 13 U/L (ref 0–35)
AST: 17 U/L (ref 0–37)
Albumin: 4.4 g/dL (ref 3.5–5.2)
Alkaline Phosphatase: 68 U/L (ref 39–117)
Bilirubin, Direct: 0.1 mg/dL (ref 0.0–0.3)
Total Bilirubin: 0.3 mg/dL (ref 0.2–1.2)
Total Protein: 7.6 g/dL (ref 6.0–8.3)

## 2016-06-26 LAB — LIPID PANEL
Cholesterol: 180 mg/dL (ref 0–200)
HDL: 51.6 mg/dL (ref >39.00)
LDL Cholesterol: 99 mg/dL (ref 0–99)
NonHDL: 128.61
Total CHOL/HDL Ratio: 3
Triglycerides: 147 mg/dL (ref 0.0–149.0)
VLDL: 29.4 mg/dL (ref 0.0–40.0)

## 2016-06-26 LAB — BASIC METABOLIC PANEL
BUN: 12 mg/dL (ref 6–23)
CO2: 29 mEq/L (ref 19–32)
Calcium: 9.8 mg/dL (ref 8.4–10.5)
Chloride: 103 mEq/L (ref 96–112)
Creatinine, Ser: 0.8 mg/dL (ref 0.40–1.20)
GFR: 74.85 mL/min (ref >60.00)
Glucose, Bld: 97 mg/dL (ref 70–99)
Potassium: 4.6 mEq/L (ref 3.5–5.1)
Sodium: 136 mEq/L (ref 135–145)

## 2016-06-26 LAB — CBC WITH DIFFERENTIAL/PLATELET
Basophils Absolute: 0.1 10*3/uL (ref 0.0–0.1)
Basophils Relative: 1.3 % (ref 0.0–3.0)
Eosinophils Absolute: 0.1 10*3/uL (ref 0.0–0.7)
Eosinophils Relative: 2.1 % (ref 0.0–5.0)
HCT: 44.5 % (ref 36.0–46.0)
Hemoglobin: 15 g/dL (ref 12.0–15.0)
Lymphocytes Relative: 27.7 % (ref 12.0–46.0)
Lymphs Abs: 1.9 10*3/uL (ref 0.7–4.0)
MCHC: 33.7 g/dL (ref 30.0–36.0)
MCV: 91.4 fl (ref 78.0–100.0)
Monocytes Absolute: 0.8 10*3/uL (ref 0.1–1.0)
Monocytes Relative: 11.2 % (ref 3.0–12.0)
Neutro Abs: 3.9 10*3/uL (ref 1.4–7.7)
Neutrophils Relative %: 57.7 % (ref 43.0–77.0)
Platelets: 372 10*3/uL (ref 150.0–400.0)
RBC: 4.87 Mil/uL (ref 3.87–5.11)
RDW: 13.4 % (ref 11.5–15.5)
WBC: 6.8 10*3/uL (ref 4.0–10.5)

## 2016-06-26 LAB — URINALYSIS, ROUTINE W REFLEX MICROSCOPIC
Bilirubin Urine: NEGATIVE
Hgb urine dipstick: NEGATIVE
Ketones, ur: NEGATIVE
Leukocytes, UA: NEGATIVE
Nitrite: NEGATIVE
RBC / HPF: NONE SEEN (ref 0–?)
Specific Gravity, Urine: <=1.005 — AB (ref 1.000–1.030)
Total Protein, Urine: NEGATIVE
Urine Glucose: NEGATIVE
Urobilinogen, UA: 0.2 (ref 0.0–1.0)
WBC, UA: NONE SEEN (ref 0–?)
pH: 7 (ref 5.0–8.0)

## 2016-06-26 LAB — TSH: TSH: 2.14 u[IU]/mL (ref 0.35–4.50)

## 2016-06-26 MED ORDER — TRAZODONE HCL 50 MG PO TABS: 25.0000 mg | ORAL_TABLET | Freq: Every evening | ORAL | 1 refills | Status: DC | PRN

## 2016-06-26 NOTE — Assessment & Plan Note (Signed)
stable overall by history and exam, recent data reviewed with pt, and pt to continue medical treatment as before,  to f/u any worsening symptoms or concerns Lab Results  Component Value Date   LDLCALC 115 (H) 12/28/2014   For f/u lab today, lower chol diet

## 2016-06-26 NOTE — Progress Notes (Signed)
Subjective:    Patient ID: Michelle Hubbard, female    DOB: 1945-06-23, 71 y.o.   MRN: [id number]  HPI  Here for yearly f/u;  Overall doing ok;  Pt denies Chest pain, worsening SOB, DOE, wheezing, orthopnea, PND, worsening LE edema, palpitations, dizziness or syncope.  Pt denies neurological change such as new headache, facial or extremity weakness.  Pt denies polydipsia, polyuria, or low sugar symptoms. Pt states overall good compliance with treatment and medications, good tolerability, and has been trying to follow appropriate diet.  Pt denies worsening depressive symptoms, suicidal ideation or panic. No fever, night sweats, wt loss, loss of appetite, or other constitutional symptoms.  Pt states good ability with ADL's, has low fall risk, home safety reviewed and adequate, no other significant changes in hearing or vision, and active with exercise with gym 3 times per wk. Declines DXA since her dentist said the tx causes dental problems  Only c/o is of persistent insomnia and difficulty getting to sleep. Has tried lunesta in past , and current temazepam not working well anymore. Has ongoing  Stress, midn wont quit to go to sleep. Willing to try trazodone qhs.   No new complaints.  Hsa gone back to smoking in last 3 yrs, just not ready to quit again Past Medical History:  Diagnosis Date  . 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 COPD 01/08/2010  . DEPRESSIVE DISORDER 12/23/2006  . FATIGUE 01/08/2010  . HYPERLIPIDEMIA 01/08/2010  . LOW BACK PAIN 12/23/2006  . OAB (overactive bladder) 10/07/2011  . OSTEOPOROSIS 01/08/2010  . 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 PONV (postoperative nausea and vomiting)   . SINUSITIS- ACUTE-NOS 07/09/2007  . Urticaria   . Vitamin D deficiency 04/30/2010   Past Surgical History:  Procedure Laterality Date  . ABDOMINAL HYSTERECTOMY    . APPENDECTOMY  1988  . BREAST BIOPSY  1986  . COLON RESECTION N/A 06/03/2013   Procedure: LAPAROSCOPY  DIAGNOSTIC and OPEN  SIGMOID COLON RESECTION ;  Surgeon: Axel Filler, MD;  Location: [facility];  Service: General;  Laterality: N/A;  . COLON SURGERY  2000   colon obstruction  . larygenscopy    . OOPHORECTOMY  bilat 1988   secondary to cyst    reports that she has been smoking Cigarettes.  She has a 44.00 pack-year smoking history. She has never used smokeless tobacco. She reports that she drinks alcohol. She reports that she does not use drugs. family history includes Colon cancer in her brother and mother; Diabetes in her maternal aunt and maternal grandmother. Allergies  Allergen Reactions  . Codeine     REACTION: nausea  . Lortab [Hydrocodone-Acetaminophen] Hives  . Penicillins    Current Outpatient Prescriptions on File Prior to Visit  Medication Sig Dispense Refill  . antiseptic oral rinse (BIOTENE) LIQD 15 mLs by Mouth Rinse route as needed for dry mouth.    Marland Kitchen aspirin 325 MG tablet Take 325 mg by mouth daily.    . Flaxseed, Linseed, (EQL FLAX SEED OIL PO) Take 5 mLs by mouth.    . levocetirizine (XYZAL) 5 MG tablet Take 5 mg by mouth every evening.    . Saline 0.65 % (Soln) SOLN Place 1 spray into the nose as needed.    . temazepam (RESTORIL) 15 MG capsule 1-2 tab by mouth at bedtime as needed 60 capsule 5  . triamcinolone (NASACORT ALLERGY 24HR) 55 MCG/ACT AERO nasal inhaler Place 2 sprays into the nose daily.  No current facility-administered medications on file prior to visit.    Review of Systems Constitutional: Negative for other unusual diaphoresis, sweats, appetite or weight changes HENT: Negative for other worsening hearing loss, ear pain, facial swelling, mouth sores or neck stiffness.   Eyes: Negative for other worsening pain, redness or other visual disturbance.  Respiratory: Negative for other stridor or swelling Cardiovascular: Negative for other palpitations or other chest pain  Gastrointestinal: Negative for worsening diarrhea or loose stools, blood in  stool, distention or other pain Genitourinary: Negative for hematuria, flank pain or other change in urine volume.  Musculoskeletal: Negative for myalgias or other joint swelling.  Skin: Negative for other color change, or other wound or worsening drainage.  Neurological: Negative for other syncope or numbness. Hematological: Negative for other adenopathy or swelling Psychiatric/Behavioral: Negative for hallucinations, other worsening agitation, SI, self-injury, or new decreased concentration All other system neg per pt    Objective:   Physical Exam BP 138/90   Pulse 84   Ht 5\' 5"  (1.651 m)   Wt 143 lb (64.9 kg)   SpO2 96%   BMI 23.80 kg/m  VS noted,  Constitutional: Pt is oriented to person, place, and time. Appears well-developed and well-nourished, in no significant distress and comfortable Head: Normocephalic and atraumatic  Eyes: Conjunctivae and EOM are normal. Pupils are equal, round, and reactive to light Right Ear: External ear normal without discharge Left Ear: External ear normal without discharge Nose: Nose without discharge or deformity Mouth/Throat: Oropharynx is without other ulcerations and moist  Neck: Normal range of motion. Neck supple. No JVD present. No tracheal deviation present or significant neck LA or mass Cardiovascular: Normal rate, regular rhythm, normal heart sounds and intact distal pulses.   Pulmonary/Chest: WOB normal and breath sounds without rales or wheezing  Abdominal: Soft. Bowel sounds are normal. NT. No HSM  Musculoskeletal: Normal range of motion. Exhibits no edema Lymphadenopathy: Has no other cervical adenopathy.  Neurological: Pt is alert and oriented to person, place, and time. Pt has normal reflexes. No cranial nerve deficit. Motor grossly intact, Gait intact Skin: Skin is warm and dry. No rash noted or new ulcerations Psychiatric:  Has normal mood and affect. Behavior is normal without agitation No other exam findings  Lab Results    Component Value Date   WBC 6.6 12/28/2014   HGB 13.7 12/28/2014   HCT 41.4 12/28/2014   PLT 367.0 12/28/2014   GLUCOSE 94 12/28/2014   CHOL 182 12/28/2014   TRIG 70.0 12/28/2014   HDL 52.90 12/28/2014   LDLDIRECT 146.3 10/07/2011   LDLCALC 115 (H) 12/28/2014   ALT 15 12/28/2014   AST 16 12/28/2014   NA 138 12/28/2014   K 4.0 12/28/2014   CL 104 12/28/2014   CREATININE 0.70 12/28/2014   BUN 15 12/28/2014   CO2 28 12/28/2014   TSH 2.28 12/28/2014   INR 0.96 05/27/2013         Assessment & Plan:

## 2016-06-26 NOTE — Patient Instructions (Signed)
Please take all new medication as prescribed- the trazodone (and ok to stop the temazepam)  Please continue all other medications as before, and refills have been done if requested.  Please have the pharmacy call with any other refills you may need.  Please continue your efforts at being more active, low cholesterol diet, and weight control.  You are otherwise up to date with prevention measures today.  Please keep your appointments with your specialists as you may have planned  Please call or let us know if you want try the Zyban again for quitting smoking  Please go to the LAB in the Basement (turn left off the elevator) for the tests to be done today  You will be contacted by phone if any changes need to be made immediately.  Otherwise, you will receive a letter about your results with an explanation, but please check with MyChart first.  Please remember to sign up for MyChart if you have not done so, as this will be important to you in the future with finding out test results, communicating by private email, and scheduling acute appointments online when needed.  Please return in 1 year for your yearly visit, or sooner if needed

## 2016-06-26 NOTE — Assessment & Plan Note (Signed)
Urged to quit, had done this with zyban in the past, but just not ready for now

## 2016-06-26 NOTE — Assessment & Plan Note (Signed)
stable overall by history and exam, recent data reviewed with pt, and pt to continue medical treatment as before,  to f/u any worsening symptoms or concerns  

## 2016-06-26 NOTE — Assessment & Plan Note (Signed)
Ok for trial trazodone 50 qhs prn, consider ambien if not effective

## 2016-06-26 NOTE — Assessment & Plan Note (Signed)
Declines f/u dxa or tx at this time

## 2016-07-21 ENCOUNTER — Encounter (HOSPITAL_COMMUNITY): Payer: Self-pay

## 2016-07-21 ENCOUNTER — Encounter (HOSPITAL_COMMUNITY): Payer: Self-pay | Admitting: Emergency Medicine

## 2016-07-21 ENCOUNTER — Emergency Department (HOSPITAL_COMMUNITY)
Admission: EM | Admit: 2016-07-21 | Discharge: 2016-07-22 | Disposition: A | Discharge status: Home / Self Care | Payer: Medicare Other | Source: Home / Self Care | Attending: Emergency Medicine | Admitting: Emergency Medicine

## 2016-07-21 ENCOUNTER — Emergency Department (HOSPITAL_COMMUNITY)
Admission: EM | Admit: 2016-07-21 | Discharge: 2016-07-21 | Payer: Medicare Other | Attending: Emergency Medicine | Admitting: Emergency Medicine

## 2016-07-21 DIAGNOSIS — Z7982 Long term (current) use of aspirin: Secondary | ICD-10-CM

## 2016-07-21 DIAGNOSIS — H0289 Other specified disorders of eyelid: Secondary | ICD-10-CM | POA: Insufficient documentation

## 2016-07-21 DIAGNOSIS — F17210 Nicotine dependence, cigarettes, uncomplicated: Secondary | ICD-10-CM | POA: Insufficient documentation

## 2016-07-21 DIAGNOSIS — J45909 Unspecified asthma, uncomplicated: Secondary | ICD-10-CM | POA: Diagnosis not present

## 2016-07-21 DIAGNOSIS — J449 Chronic obstructive pulmonary disease, unspecified: Secondary | ICD-10-CM | POA: Insufficient documentation

## 2016-07-21 LAB — BASIC METABOLIC PANEL
Anion gap: 10 (ref 5–15)
BUN: 15 mg/dL (ref 6–20)
CO2: 25 mmol/L (ref 22–32)
Calcium: 9 mg/dL (ref 8.9–10.3)
Chloride: 99 mmol/L — ABNORMAL LOW (ref 101–111)
Creatinine, Ser: 0.74 mg/dL (ref 0.44–1.00)
GFR calc Af Amer: >60 mL/min (ref >60)
GFR calc non Af Amer: >60 mL/min (ref >60)
Glucose, Bld: 103 mg/dL — ABNORMAL HIGH (ref 65–99)
Potassium: 3.8 mmol/L (ref 3.5–5.1)
Sodium: 134 mmol/L — ABNORMAL LOW (ref 135–145)

## 2016-07-21 LAB — CBC WITH DIFFERENTIAL/PLATELET
Basophils Absolute: 0 10*3/uL (ref 0.0–0.1)
Basophils Relative: 0 %
Eosinophils Absolute: 0.1 10*3/uL (ref 0.0–0.7)
Eosinophils Relative: 3 %
HCT: 44.3 % (ref 36.0–46.0)
Hemoglobin: 15 g/dL (ref 12.0–15.0)
Lymphocytes Relative: 32 %
Lymphs Abs: 1.8 10*3/uL (ref 0.7–4.0)
MCH: 31 pg (ref 26.0–34.0)
MCHC: 33.9 g/dL (ref 30.0–36.0)
MCV: 91.5 fL (ref 78.0–100.0)
Monocytes Absolute: 0.8 10*3/uL (ref 0.1–1.0)
Monocytes Relative: 13 %
Neutro Abs: 2.9 10*3/uL (ref 1.7–7.7)
Neutrophils Relative %: 52 %
Platelets: 318 10*3/uL (ref 150–400)
RBC: 4.84 MIL/uL (ref 3.87–5.11)
RDW: 13.2 % (ref 11.5–15.5)
WBC: 5.7 10*3/uL (ref 4.0–10.5)

## 2016-07-21 MED ORDER — HYDROGEN PEROXIDE 3 % EX SOLN
CUTANEOUS | Status: AC
  Filled 2016-07-21: qty 473

## 2016-07-21 MED ORDER — ONDANSETRON HCL 4 MG/2ML IJ SOLN
4.0000 mg | Freq: Once | INTRAMUSCULAR | Status: AC
  Administered 2016-07-21: 4 mg via INTRAVENOUS

## 2016-07-21 MED ORDER — LORAZEPAM 2 MG/ML IJ SOLN
1.0000 mg | Freq: Once | INTRAMUSCULAR | Status: AC
  Administered 2016-07-21: 1 mg via INTRAVENOUS
  Filled 2016-07-21: qty 1

## 2016-07-21 MED ORDER — ONDANSETRON HCL 4 MG/2ML IJ SOLN
INTRAMUSCULAR | Status: AC
  Filled 2016-07-21: qty 2

## 2016-07-21 MED ORDER — SODIUM CHLORIDE 0.9 % IV BOLUS (SEPSIS)
500.0000 mL | Freq: Once | INTRAVENOUS | Status: AC
  Administered 2016-07-21: 500 mL via INTRAVENOUS

## 2016-07-21 NOTE — ED Triage Notes (Signed)
Patient states she had cyst removed from left lower eyelid today. States she was on the way home from surgery when she started bleeding from left eye. Patient has significant bleeding noted to left eye at triage. Complains of burning to left eye.

## 2016-07-21 NOTE — ED Triage Notes (Signed)
Per family: Pt had a procedure at eye office. Pt has been bleeding from site she had something removed from. Bleeding is from under the left eye. Pressure is applied. Pt has been dizzy and woozy since the bleeding started. Pt was also given some ativan and zofran at [facility]. Blood is oozing from the site and does not stop unless pressure is applied. Two abd pads and multiple 4X4 gauze applied to eye, pressure applied, wrapped with Koban to keep dressing in place. Pt states that she took 4 ASA yesterday prior to procedure.   20 g [facility] in [facility] from [facility].

## 2016-07-21 NOTE — ED Provider Notes (Signed)
[facility]-EMERGENCY DEPT Provider Note   CSN: [id number] Arrival date & time: 07/21/16  1324     History   Chief Complaint Chief Complaint  Patient presents with  . Eye Problem    HPI Michelle Hubbard is a 71 y.o. female.  Level V caveat for urgent need for intervention. Patient is status post removal of chalazion from left inferior medial eyelid this morning by Dr Jethro Bolus in [place]. She returned to [facility] and excessive bleeding was noted from the operative site. No chest pain or dyspnea.      Past Medical History:  Diagnosis Date  . 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 COPD 01/08/2010  . DEPRESSIVE DISORDER 12/23/2006  . FATIGUE 01/08/2010  . HYPERLIPIDEMIA 01/08/2010  . LOW BACK PAIN 12/23/2006  . OAB (overactive bladder) 10/07/2011  . OSTEOPOROSIS 01/08/2010  . 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 PONV (postoperative nausea and vomiting)   . SINUSITIS- ACUTE-NOS 07/09/2007  . Urticaria   . Vitamin D deficiency 04/30/2010    Patient Active Problem List   Diagnosis Date Noted  . Pulmonary nodules 01/15/2016  . 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 Adverse food reaction 01/15/2016  . CAP (community acquired pneumonia) 11/28/2015  . COPD exacerbation (HCC) 11/28/2015  . 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/28/2015  . Dysuria 07/04/2015  . Dry mouth 12/28/2014  . Bilateral hearing loss 12/20/2013  . 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  . OAB (overactive bladder) 10/07/2011  . Preventative health care 10/07/2011  . Smoker 10/07/2011  . Chronic insomnia 08/15/2011  . Vitamin D deficiency 04/30/2010  . Hyperlipidemia 01/08/2010  . 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 COPD (chronic obstructive pulmonary disease) (HCC) 01/08/2010  . Osteoporosis 01/08/2010  . Fatigue 01/08/2010  . Depression 12/23/2006  . LOW BACK PAIN 12/23/2006    Past Surgical History:  Procedure  Laterality Date  . ABDOMINAL HYSTERECTOMY    . APPENDECTOMY  1988  . BREAST BIOPSY  1986  . COLON RESECTION N/A 06/03/2013   Procedure: LAPAROSCOPY DIAGNOSTIC and OPEN  SIGMOID COLON RESECTION ;  Surgeon: Axel Filler, MD;  Location: [facility];  Service: General;  Laterality: N/A;  . COLON SURGERY  2000   colon obstruction  . larygenscopy    . OOPHORECTOMY  bilat 1988   secondary to cyst    OB History    No data available       Home Medications    Prior to Admission medications   Medication Sig Start Date End Date Taking? Authorizing Provider  antiseptic oral rinse (BIOTENE) LIQD 15 mLs by Mouth Rinse route as needed for dry mouth.    [provider]  aspirin 325 MG tablet Take 325 mg by mouth daily.    [provider]  Flaxseed, Linseed, (EQL FLAX SEED OIL PO) Take 5 mLs by mouth.    [provider]  levocetirizine (XYZAL) 5 MG tablet Take 5 mg by mouth every evening.    [provider]  Saline 0.65 % (Soln) SOLN Place 1 spray into the nose as needed.    [provider]  temazepam (RESTORIL) 15 MG capsule 1-2 tab by mouth at bedtime as needed 02/06/16   Corwin Levins, MD  traZODone (DESYREL) 50 MG tablet Take 0.5-1 tablets (25-50 mg total) by mouth at bedtime as needed for sleep. 06/26/16   Corwin Levins, MD  triamcinolone (NASACORT ALLERGY 24HR) 55 MCG/ACT AERO nasal inhaler  Place 2 sprays into the nose daily.    [provider]    Family History Family History  Problem Relation Age of Onset  . Colon cancer Mother   . Colon cancer Brother   . Diabetes Maternal Aunt   . Diabetes Maternal Grandmother   . Allergic rhinitis Neg Hx   . Angioedema Neg Hx   . Asthma Neg Hx   . Eczema Neg Hx   . Immunodeficiency Neg Hx   . Urticaria Neg Hx     Social History Social History  Substance Use Topics  . Smoking status: Current Every Day Smoker    Packs/day: 1.00    Years: 44.00    Types: Cigarettes  . Smokeless tobacco:  Never Used     Comment: smoking more due to move/ trying to quit  . Alcohol use 0.0 oz/week     Comment: vodka nightly     Allergies   Codeine; Lortab [hydrocodone-acetaminophen]; and Penicillins   Review of Systems Review of Systems  Reason unable to perform ROS: Urgent need for intervention.     Physical Exam Updated Vital Signs BP (!) 174/88 (BP Location: Left Arm)   Pulse (!) 102   Temp 98 F (36.7 C) (Oral)   Resp 18   Ht 5' 5.5" (1.664 m)   Wt 63.5 kg (140 lb)   SpO2 95%   BMI 22.94 kg/m   Physical Exam  Constitutional: She is oriented to person, place, and time. She appears well-developed and well-nourished.  HENT:  Head: Normocephalic and atraumatic.  Eyes:  Excessive bleeding from left inferior medial eyelid.  Difficult to visualize exact site of blood loss  Neck: Neck supple.  Cardiovascular: Normal rate and regular rhythm.   Pulmonary/Chest: Effort normal and breath sounds normal.  Abdominal: Soft. Bowel sounds are normal.  Musculoskeletal: Normal range of motion.  Neurological: She is alert and oriented to person, place, and time.  Skin: Skin is warm and dry.  Psychiatric: She has a normal mood and affect. Her behavior is normal.  Nursing note and vitals reviewed.    ED Treatments / Results  Labs (all labs ordered are listed, but only abnormal results are displayed) Labs Reviewed  CBC WITH DIFFERENTIAL/PLATELET  BASIC METABOLIC PANEL    EKG  EKG Interpretation None       Radiology No results found.  Procedures Procedures (including critical care time)  Medications Ordered in ED Medications  sodium chloride 0.9 % bolus 500 mL (not administered)     Initial Impression / Assessment and Plan / ED Course  I have reviewed the triage vital signs and the nursing notes.  Pertinent labs & imaging results that were available during my care of the patient were reviewed by me and considered in my medical decision making (see chart for  details).    CRITICAL CARE Performed by: Donnetta Hutching  ?  Total critical care time: 30 minutes  Critical care time was exclusive of separately billable procedures and treating other patients.  Critical care was necessary to treat or prevent imminent or life-threatening deterioration.  Critical care was time spent personally by me on the following activities: development of treatment plan with patient and/or surrogate as well as nursing, discussions with consultants, evaluation of patient's response to treatment, examination of patient, obtaining history from patient or surrogate, ordering and performing treatments and interventions, ordering and review of laboratory studies, ordering and review of radiographic studies, pulse oximetry and re-evaluation of patient's condition.  Direct pressure was applied  to the wound. This helped the excessive blood loss, but did not completely abate it.  Discussed with ophthalmologist Dr. Jethro BolusMark Shapiro. He [place] the [place] in [place].  Final Clinical Impressions(s) / ED Diagnoses   Final diagnoses:  Eyelid bleeding    New Prescriptions New Prescriptions   No medications on file     Donnetta Hutchingook, Gibson Telleria, MD 07/21/16 1441

## 2016-07-21 NOTE — Discharge Instructions (Signed)
Apply hemostatic gauze to corner of L bridge of nose as needed for additional bleeding episode. Do NOT apply hemostatic gauze directly to eye. Please followup as scheduled with Dr. Nile RiggsShapiro

## 2016-07-21 NOTE — ED Provider Notes (Signed)
[facility]-EMERGENCY DEPT Provider Note   CSN: [id number] Arrival date & time: 07/21/16  1600     History   Chief Complaint Chief Complaint  Patient presents with  . Bleeding from face    HPI Michelle Hubbard is a 71 y.o. female.  The history is provided by the patient, a relative and medical records. No language interpreter was used.  Illness  This is a new problem. The current episode started 6 to 12 hours ago. The problem occurs constantly. The problem has been gradually improving. Pertinent negatives include no chest pain, no abdominal pain and no shortness of breath. Nothing aggravates the symptoms. Nothing relieves the symptoms. Treatments tried: pressure dressing. The treatment provided moderate relief.    Past Medical History:  Diagnosis Date  . 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 COPD 01/08/2010  . DEPRESSIVE DISORDER 12/23/2006  . FATIGUE 01/08/2010  . HYPERLIPIDEMIA 01/08/2010  . LOW BACK PAIN 12/23/2006  . OAB (overactive bladder) 10/07/2011  . OSTEOPOROSIS 01/08/2010  . 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 PONV (postoperative nausea and vomiting)   . SINUSITIS- ACUTE-NOS 07/09/2007  . Urticaria   . Vitamin D deficiency 04/30/2010    Patient Active Problem List   Diagnosis Date Noted  . Pulmonary nodules 01/15/2016  . 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 Adverse food reaction 01/15/2016  . CAP (community acquired pneumonia) 11/28/2015  . COPD exacerbation (HCC) 11/28/2015  . 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/28/2015  . Dysuria 07/04/2015  . Dry mouth 12/28/2014  . Bilateral hearing loss 12/20/2013  . 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  . OAB (overactive bladder) 10/07/2011  . Preventative health care 10/07/2011  . Smoker 10/07/2011  . Chronic insomnia 08/15/2011  . Vitamin D deficiency 04/30/2010  . Hyperlipidemia 01/08/2010  . 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  COPD (chronic obstructive pulmonary disease) (HCC) 01/08/2010  . Osteoporosis 01/08/2010  . Fatigue 01/08/2010  . Depression 12/23/2006  . LOW BACK PAIN 12/23/2006    Past Surgical History:  Procedure Laterality Date  . ABDOMINAL HYSTERECTOMY    . APPENDECTOMY  1988  . BREAST BIOPSY  1986  . COLON RESECTION N/A 06/03/2013   Procedure: LAPAROSCOPY DIAGNOSTIC and OPEN  SIGMOID COLON RESECTION ;  Surgeon: Axel Filler, MD;  Location: [facility];  Service: General;  Laterality: N/A;  . COLON SURGERY  2000   colon obstruction  . larygenscopy    . OOPHORECTOMY  bilat 1988   secondary to cyst    OB History    No data available       Home Medications    Prior to Admission medications   Medication Sig Start Date End Date Taking? Authorizing Provider  antiseptic oral rinse (BIOTENE) LIQD 15 mLs by Mouth Rinse route as needed for dry mouth.    [provider]  aspirin 325 MG tablet Take 325 mg by mouth daily.    [provider]  Flaxseed, Linseed, (EQL FLAX SEED OIL PO) Take 5 mLs by mouth.    [provider]  levocetirizine (XYZAL) 5 MG tablet Take 5 mg by mouth every evening.    [provider]  Saline 0.65 % (Soln) SOLN Place 1 spray into the nose as needed.    [provider]  temazepam (RESTORIL) 15 MG capsule 1-2 tab by mouth at bedtime as needed 02/06/16   Corwin Levins, MD  traZODone (DESYREL) 50 MG tablet Take 0.5-1 tablets (  25-50 mg total) by mouth at bedtime as needed for sleep. 06/26/16   Corwin LevinsJohn, James W, MD  triamcinolone (NASACORT ALLERGY 24HR) 55 MCG/ACT AERO nasal inhaler Place 2 sprays into the nose daily.    [provider]    Family History Family History  Problem Relation Age of Onset  . Colon cancer Mother   . Colon cancer Brother   . Diabetes Maternal Aunt   . Diabetes Maternal Grandmother   . Allergic rhinitis Neg Hx   . Angioedema Neg Hx   . Asthma Neg Hx   . Eczema Neg Hx   . Immunodeficiency Neg Hx   .  Urticaria Neg Hx     Social History Social History  Substance Use Topics  . Smoking status: Current Every Day Smoker    Packs/day: 1.00    Years: 44.00    Types: Cigarettes  . Smokeless tobacco: Never Used     Comment: smoking more due to move/ trying to quit  . Alcohol use 0.0 oz/week     Comment: vodka nightly     Allergies   Codeine; Lortab [hydrocodone-acetaminophen]; and Penicillins   Review of Systems Review of Systems  Constitutional: Negative for chills and fever.  HENT: Negative for ear pain and sore throat.   Eyes: Negative for pain and visual disturbance.  Respiratory: Negative for cough and shortness of breath.   Cardiovascular: Negative for chest pain and palpitations.  Gastrointestinal: Negative for abdominal pain and vomiting.  Genitourinary: Negative for dysuria and hematuria.  Musculoskeletal: Negative for arthralgias and back pain.  Skin: Positive for wound. Negative for color change and rash.  Neurological: Negative for seizures and syncope.  All other systems reviewed and are negative.    Physical Exam Updated Vital Signs BP (!) 141/78   Pulse 71   Temp 98.2 F (36.8 C) (Oral)   Resp 16   Wt 63.5 kg (140 lb)   SpO2 95%   BMI 22.94 kg/m   Physical Exam  Constitutional: She appears well-developed and well-nourished. No distress.  HENT:  Head: Normocephalic and atraumatic.  Eyes: EOM are normal.  Incision site over inner L lower eyelid. Moderate blood clot removed. Minimal oozing on exam  Neck: Neck supple.  Cardiovascular: Normal rate and regular rhythm.   No murmur heard. Pulmonary/Chest: Effort normal and breath sounds normal. No respiratory distress.  Abdominal: Soft. There is no tenderness.  Musculoskeletal: She exhibits no edema.  Neurological: She is alert. No cranial nerve deficit. Coordination normal.  5/5 motor strength and intact sensation in all extremities. Finger-to-nose intact bilaterally  Skin: Skin is warm and dry.    Nursing note and vitals reviewed.    ED Treatments / Results  Labs (all labs ordered are listed, but only abnormal results are displayed) Labs Reviewed - No data to display  EKG  EKG Interpretation None       Radiology No results found.  Procedures Procedures (including critical care time)  Medications Ordered in ED Medications - No data to display   Initial Impression / Assessment and Plan / ED Course  I have reviewed the triage vital signs and the nursing notes.  Pertinent labs & imaging results that were available during my care of the patient were reviewed by me and considered in my medical decision making (see chart for details).     71 year old female who [facility] as [facility] for evaluation of sutures to bleeding eyelid wound.  Patient had chalazion excision by Dr.  Nile Riggs this morning.  Wound was dressed and covered.  Patient noted bleeding from wound this afternoon and presented to [facility].  Despite direct pressure, patient continued to have brisk bleeding from excision site.  Patient was instructed to present to [facility] for possible placement of sutures to wound.  A compression dressing was applied prior to arrival.  Patient waited several hours before assessment and wound currently appears hemostatic.  She denies any other acute symptoms.  AF, VSS. Compressive dressing removed. Large blood clot removed from L eyelid. Minimal oozing noted.  Spoke with Ophthalmology. Recommended application of absorbable hemostat agent. On reassessment, no active oozing from lower eyelid wound. Blood clot over inner lower eyelid appears stable. Pt given hemostat agent and materials in case bleeding restarts. She will f/u closely with Dr. Nile Riggs.  Return precautions provided for worsening symptoms. Pt will f/u with PCP at first availability. Pt verbalized agreement with plan.  Pt care d/w Dr. Jeraldine Loots  Final Clinical Impressions(s) / ED  Diagnoses   Final diagnoses:  Eyelid bleeding    New Prescriptions Discharge Medication List as of 07/21/2016 11:56 PM       Christel Bai, Homero Fellers, MD 07/23/16 1117    Gerhard Munch, MD 07/26/16 [phone]

## 2016-07-21 NOTE — ED Notes (Signed)
ED Provider at bedside. 

## 2016-07-21 NOTE — ED Notes (Signed)
Pt bleeding profusely from left eye. Gauze and pressure applied. EDP at bedside. Pt reports having cyst removed from left inner lower eyelid in Dr. Ashley RoyaltyShapiro's office today. Pt also reports she has been taking 1300 mg ASA daily x 1 month. Ice applied.

## 2016-07-21 NOTE — ED Notes (Signed)
Pt A&OX4, had cyst removed from eye today with MD Nile RiggsShapiro. On the way home from procedure pt reports her eye started bleeding, [facility]. MD Nile RiggsShapiro advised pt come back to his office. After seeing pt he sent her to ED stating she would need a stitch in inner eye where procedure was preformed. Pt given ice pack, No active bleeding at this time. Left eye is swollen with ecchymosis.

## 2016-07-21 NOTE — ED Notes (Signed)
RN spoke with Annabelle Harmanana at Dr.Shapiro's office to confirm pt transfer via EMS to facility with IV access.

## 2016-08-05 ENCOUNTER — Encounter: Payer: Self-pay | Admitting: Internal Medicine

## 2016-08-05 MED ORDER — TEMAZEPAM 15 MG PO CAPS: ORAL_CAPSULE | ORAL | 5 refills | Status: DC

## 2016-08-05 NOTE — Telephone Encounter (Signed)
Done hardcopy to Shirron  

## 2016-08-07 ENCOUNTER — Other Ambulatory Visit: Payer: Self-pay | Admitting: Internal Medicine

## 2016-08-09 ENCOUNTER — Other Ambulatory Visit: Payer: Self-pay | Admitting: Internal Medicine

## 2016-08-12 NOTE — Telephone Encounter (Signed)
This is too soon, as last rx was just done July 10

## 2016-08-12 NOTE — Telephone Encounter (Signed)
Called [facility] to verify if rx was receive spoke w/Crystal she stated that the rx was received, and pt pick up on 7/15.She has voided this request..../lmb

## 2016-08-15 ENCOUNTER — Other Ambulatory Visit: Payer: Self-pay | Admitting: Internal Medicine

## 2016-08-19 NOTE — Telephone Encounter (Signed)
Xanax too soon as last rx just done July 10

## 2016-08-19 NOTE — Telephone Encounter (Signed)
Rec'd call from pharmacist jason calling concerning temazepam script. He states received denial for refill and wanting to know why. Inform him per chart rx was filled and faxed back on 08/07/16. Barbara CowerJason states they never received, he actually pull pt order. rx was faxed to the wrong pharmacy. Was faxed to [facility] in [place] instead. He state he will call to have script transferred...Raechel Chute/lmb

## 2016-12-25 NOTE — Progress Notes (Signed)
Subjective:    Patient ID: Michelle Hubbard, female    DOB: [birth year], 71 y.o.   MRN: [id number]  HPI She is here for an acute visit.   Ear pain:  She started having right ear pain several days ago.  She started using otc ear drops for ear pain and it initially helped.  The pain improved, but is bad again.  The pain radiates from the ear down the neck.    Rash: the rash started Wednesday.  It itches and Michelle Hubbard. The rash is on the back of her neck and head, anterior neck and upper chest.  It Michelle Hubbard in the back of her head and itches on her anterior neck and chest.   The rash is not uniform and looks like there are blisters in a few areas.     She had shingles about 20 years ago and had it more than once.    Medications and allergies reviewed with patient and updated if appropriate.  Patient Active Problem List   Diagnosis Date Noted  . Pulmonary nodules 01/15/2016  . 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 Adverse food reaction 01/15/2016  . CAP (community acquired pneumonia) 11/28/2015  . COPD exacerbation (HCC) 11/28/2015  . 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/28/2015  . Dysuria 07/04/2015  . Dry mouth 12/28/2014  . Bilateral hearing loss 12/20/2013  . 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  . OAB (overactive bladder) 10/07/2011  . Preventative health care 10/07/2011  . Smoker 10/07/2011  . Chronic insomnia 08/15/2011  . Vitamin D deficiency 04/30/2010  . Hyperlipidemia 01/08/2010  . 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 COPD (chronic obstructive pulmonary disease) (HCC) 01/08/2010  . Osteoporosis 01/08/2010  . Fatigue 01/08/2010  . Depression 12/23/2006  . LOW BACK PAIN 12/23/2006    Current Outpatient Medications on File Prior to Visit  Medication Sig Dispense Refill  . antiseptic oral rinse (BIOTENE) LIQD 15 mLs by Mouth Rinse route as needed for dry mouth.    Marland Kitchen. aspirin 325 MG tablet Take 325 mg by mouth daily.      . Flaxseed, Linseed, (EQL FLAX SEED OIL PO) Take 5 mLs by mouth.    . levocetirizine (XYZAL) 5 MG tablet Take 5 mg by mouth every evening.    . Saline 0.65 % (Soln) SOLN Place 1 spray into the nose as needed.    . temazepam (RESTORIL) 15 MG capsule 1-2 tab by mouth at bedtime as needed 60 capsule 5  . triamcinolone (NASACORT ALLERGY 24HR) 55 MCG/ACT AERO nasal inhaler Place 2 sprays into the nose daily.     No current facility-administered medications on file prior to visit.     Past Medical History:  Diagnosis Date  . 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 COPD 01/08/2010  . DEPRESSIVE DISORDER 12/23/2006  . FATIGUE 01/08/2010  . HYPERLIPIDEMIA 01/08/2010  . LOW BACK PAIN 12/23/2006  . OAB (overactive bladder) 10/07/2011  . OSTEOPOROSIS 01/08/2010  . 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 PONV (postoperative nausea and vomiting)   . SINUSITIS- ACUTE-NOS 07/09/2007  . Urticaria   . Vitamin D deficiency 04/30/2010    Past Surgical History:  Procedure Laterality Date  . ABDOMINAL HYSTERECTOMY    . APPENDECTOMY  1988  . BREAST BIOPSY  1986  . COLON RESECTION N/A 06/03/2013   Procedure: LAPAROSCOPY DIAGNOSTIC and OPEN  SIGMOID COLON RESECTION ;  Surgeon: Axel FillerArmando Ramirez, MD;  Location: [facility];  Service: General;  Laterality: N/A;  . COLON SURGERY  2000   colon obstruction  . larygenscopy    . OOPHORECTOMY  bilat 1988   secondary to cyst    Social History   Socioeconomic History  . Marital status: Widowed    Spouse name: None  . Number of children: None  . Years of education: None  . Highest education level: None  Social Needs  . Financial resource strain: None  . Food insecurity - worry: None  . Food insecurity - inability: None  . Transportation needs - medical: None  . Transportation needs - non-medical: None  Occupational History  . Occupation: Retired    Associate Professormployer: Designer, [employer]  Tobacco Use  . Smoking status: Current Every Day  Smoker    Packs/day: 1.00    Years: 44.00    Pack years: 44.00    Types: Cigarettes  . Smokeless tobacco: Never Used  . Tobacco comment: smoking more due to move/ trying to quit  Substance and Sexual Activity  . Alcohol use: Yes    Alcohol/week: 0.0 oz    Comment: vodka nightly  . Drug use: No  . Sexual activity: Not Currently  Other Topics Concern  . None  Social History Narrative  . None    Family History  Problem Relation Age of Onset  . Colon cancer Mother   . Colon cancer Brother   . Diabetes Maternal Aunt   . Diabetes Maternal Grandmother   . Allergic rhinitis Neg Hx   . Angioedema Neg Hx   . Asthma Neg Hx   . Eczema Neg Hx   . Immunodeficiency Neg Hx   . Urticaria Neg Hx     Review of Systems  Constitutional: Negative for chills and fever.  HENT: Positive for hearing loss (chronic - no acute change).   Skin: Positive for rash.  Neurological: Negative for dizziness, light-headedness and headaches.       Objective:   Vitals:   12/26/16 1017  BP: 140/82  Pulse: 98  Resp: 16  Temp: 97.9 F (36.6 C)  SpO2: 95%   Filed Weights   12/26/16 1017  Weight: 141 lb (64 kg)   Body mass index is 23.11 kg/m.  Wt Readings from Last 3 Encounters:  12/26/16 141 lb (64 kg)  07/21/16 140 lb (63.5 kg)  07/21/16 140 lb (63.5 kg)     Physical Exam  Constitutional: She appears well-developed and well-nourished. No distress.  HENT:  Head: Normocephalic and atraumatic.  Right Ear: External ear normal.  Left Ear: External ear normal.  Left ear canal and TM normal.  R ear canal with mild erythema, no vesicles seen in ear canal or on TM  Eyes: Conjunctivae are normal.  Neck: Neck supple. No tracheal deviation present. No thyromegaly present.  Lymphadenopathy:    She has no cervical adenopathy.  Skin: Rash noted. She is not diaphoretic.  Areas of erythema and several very tiny blisters or vesicles in several areas on posterior neck and head, upper right chest and  neck          Assessment & Plan:   See Problem List for Assessment and Plan of chronic medical problems.

## 2016-12-26 ENCOUNTER — Encounter: Payer: Self-pay | Admitting: Internal Medicine

## 2016-12-26 ENCOUNTER — Ambulatory Visit (INDEPENDENT_AMBULATORY_CARE_PROVIDER_SITE_OTHER): Payer: Medicare Other | Admitting: Internal Medicine

## 2016-12-26 VITALS — BP 140/82 | HR 98 | Temp 97.9°F | Resp 16 | Wt 141.0 lb

## 2016-12-26 DIAGNOSIS — B029 Zoster without complications: Secondary | ICD-10-CM

## 2016-12-26 MED ORDER — VALACYCLOVIR HCL 1 G PO TABS: 1000.0000 mg | ORAL_TABLET | Freq: Three times a day (TID) | ORAL | 0 refills | Status: DC

## 2016-12-26 MED ORDER — GABAPENTIN 100 MG PO CAPS: 100.0000 mg | ORAL_CAPSULE | Freq: Every day | ORAL | 3 refills | Status: DC

## 2016-12-26 MED ORDER — PREDNISONE 50 MG PO TABS: 50.0000 mg | ORAL_TABLET | Freq: Every day | ORAL | 0 refills | Status: DC

## 2016-12-26 NOTE — Patient Instructions (Signed)
Take the valtrex ( anti-viral) as prescribed.  Take the prednisone as prescribed.   Take the gabapentin at night - this can be increased if tolerated.   Call with any side effects or questions.       Shingles Shingles, which is also known as herpes zoster, is an infection that causes a painful skin rash and fluid-filled blisters. Shingles is not related to genital herpes, which is a sexually transmitted infection. Shingles only develops in people who:  Have had chickenpox.  Have received the chickenpox vaccine. (This is rare.)  What are the causes? Shingles is caused by varicella-zoster virus (VZV). This is the same virus that causes chickenpox. After exposure to VZV, the virus stays in the body in an inactive (dormant) state. Shingles develops if the virus reactivates. This can happen many years after the initial exposure to VZV. It is not known what causes this virus to reactivate. What increases the risk? People who have had chickenpox or received the chickenpox vaccine are at risk for shingles. Infection is more common in people who:  Are older than age 73.  Have a weakened defense (immune) system, such as those with HIV, AIDS, or cancer.  Are taking medicines that weaken the immune system, such as transplant medicines.  Are under great stress.  What are the signs or symptoms? Early symptoms of this condition include itching, tingling, and pain in an area on your skin. Pain may be described as burning, stabbing, or throbbing. A few days or weeks after symptoms start, a painful red rash appears, usually on one side of the body in a bandlike or beltlike pattern. The rash eventually turns into fluid-filled blisters that break open, scab over, and dry up in about 2-3 weeks. At any time during the infection, you may also develop:  A fever.  Chills.  A headache.  An upset stomach.  How is this diagnosed? This condition is diagnosed with a skin exam. Sometimes, skin or  fluid samples are taken from the blisters before a diagnosis is made. These samples are examined under a microscope or sent to a lab for testing. How is this treated? There is no specific cure for this condition. Your health care provider will probably prescribe medicines to help you manage pain, recover more quickly, and avoid long-term problems. Medicines may include:  Antiviral drugs.  Anti-inflammatory drugs.  Pain medicines.  If the area involved is on your face, you may be referred to a specialist, such as an eye doctor (ophthalmologist) or an ear, nose, and throat (ENT) doctor to help you avoid eye problems, chronic pain, or disability. Follow these instructions at home: Medicines  Take medicines only as directed by your health care provider.  Apply an anti-itch or numbing cream to the affected area as directed by your health care provider. Blister and Rash Care  Take a cool bath or apply cool compresses to the area of the rash or blisters as directed by your health care provider. This may help with pain and itching.  Keep your rash covered with a loose bandage (dressing). Wear loose-fitting clothing to help ease the pain of material rubbing against the rash.  Keep your rash and blisters clean with mild soap and cool water or as directed by your health care provider.  Check your rash every day for signs of infection. These include redness, swelling, and pain that lasts or increases.  Do not pick your blisters.  Do not scratch your rash. General instructions  Rest as  directed by your health care provider.  Keep all follow-up visits as directed by your health care provider. This is important.  Until your blisters scab over, your infection can cause chickenpox in people who have never had it or been vaccinated against it. To prevent this from happening, avoid contact with other people, especially: ? Babies. ? Pregnant women. ? Children who have eczema. ? Elderly people who  have transplants. ? People who have chronic illnesses, such as leukemia or AIDS. Contact a health care provider if:  Your pain is not relieved with prescribed medicines.  Your pain does not get better after the rash heals.  Your rash looks infected. Signs of infection include redness, swelling, and pain that lasts or increases. Get help right away if:  The rash is on your face or nose.  You have facial pain, pain around your eye area, or loss of feeling on one side of your face.  You have ear pain or you have ringing in your ear.  You have loss of taste.  Your condition gets worse. This information is not intended to replace advice given to you by your health care provider. Make sure you discuss any questions you have with your health care provider. Document Released: 01/13/2005 Document Revised: 09/09/2015 Document Reviewed: 11/24/2013 Elsevier Interactive Patient Education  2017 [employer]

## 2016-12-26 NOTE — Assessment & Plan Note (Signed)
Rash consistent with shingles Valtrex tid x 10 days Gabapentin 100 mg at night - can titrate if needed Prednisone 50 mg daily for 5 days Hold aspirin/advil for now Call with questions/concerns Discussed getting shingrix vaccine but waiting for a few months

## 2016-12-28 ENCOUNTER — Encounter: Payer: Self-pay | Admitting: Internal Medicine

## 2017-01-12 ENCOUNTER — Encounter: Payer: Self-pay | Admitting: Internal Medicine

## 2017-01-18 ENCOUNTER — Encounter: Payer: Self-pay | Admitting: Internal Medicine

## 2017-01-18 MED ORDER — CLOBETASOL PROPIONATE 0.05 % EX CREA: 1.0000 "application " | TOPICAL_CREAM | Freq: Two times a day (BID) | CUTANEOUS | 0 refills | Status: DC

## 2017-02-05 ENCOUNTER — Encounter: Payer: Self-pay | Admitting: Internal Medicine

## 2017-02-05 MED ORDER — TEMAZEPAM 15 MG PO CAPS: ORAL_CAPSULE | ORAL | 3 refills | Status: DC

## 2017-02-05 NOTE — Telephone Encounter (Signed)
Done erx 

## 2017-06-06 ENCOUNTER — Other Ambulatory Visit: Payer: Self-pay | Admitting: Internal Medicine

## 2017-06-06 ENCOUNTER — Encounter: Payer: Self-pay | Admitting: Internal Medicine

## 2017-06-08 MED ORDER — TEMAZEPAM 15 MG PO CAPS: ORAL_CAPSULE | ORAL | 1 refills | Status: DC

## 2017-06-08 NOTE — Telephone Encounter (Signed)
Done erx 

## 2017-06-25 ENCOUNTER — Ambulatory Visit: Payer: Medicare Other

## 2017-07-02 ENCOUNTER — Ambulatory Visit (INDEPENDENT_AMBULATORY_CARE_PROVIDER_SITE_OTHER): Payer: Medicare Other | Admitting: *Deleted

## 2017-07-02 VITALS — BP 138/72 | HR 78 | Resp 18 | Ht 65.0 in | Wt 139.0 lb

## 2017-07-02 DIAGNOSIS — Z0000 Encounter for general adult medical examination without abnormal findings: Secondary | ICD-10-CM | POA: Diagnosis not present

## 2017-07-02 NOTE — Patient Instructions (Signed)
Continue doing brain stimulating activities (puzzles, reading, adult coloring books, staying active) to keep memory sharp.   Continue to eat heart healthy diet (full of fruits, vegetables, whole grains, lean protein, water--limit salt, fat, and sugar intake) and increase physical activity as tolerated.   Michelle Hubbard , Thank you for taking time to come for your Medicare Wellness Visit. I appreciate your ongoing commitment to your health goals. Please review the following plan we discussed and let me know if I can assist you in the future.   These are the goals we discussed: Goals    . Patient Stated     I want to try to stop smoking, I am thinking about and planning.    . Quit smoking / using tobacco     Smoking;  Educated to avoid secondary smoke Smoking cessation at [facility]: [phone] Classes offered a couple of times a month;  Will work with the patient as far as registration and location Meds may help; chatix (Varenicline); Zyban (Bupropion SR); Nicotine Replacement (gum; lozenges; patches; etc.) 30 pack yr smoking hx: Educated regarding LDCT; To discuss with MD at next fup.         This is a list of the screening recommended for you and due dates:  Health Maintenance  Topic Date Due  . Mammogram  02/13/2017  . Flu Shot  08/27/2017  . Tetanus Vaccine  10/06/2021  . Colon Cancer Screening  05/12/2022  . DEXA scan (bone density measurement)  Completed  .  Hepatitis C: One time screening is recommended by Center for Disease Control  (CDC) for  adults born from [birth year] through 1967.   Completed  . Pneumonia vaccines  Completed   Health Maintenance, Female Adopting a healthy lifestyle and getting preventive care can go a long way to promote health and wellness. Talk with your health care provider about what schedule of regular examinations is right for you. This is a good chance for you to check in with your provider about disease prevention and staying healthy. In between  checkups, there are plenty of things you can do on your own. Experts have done a lot of research about which lifestyle changes and preventive measures are most likely to keep you healthy. Ask your health care provider for more information. Weight and diet Eat a healthy diet  Be sure to include plenty of vegetables, fruits, low-fat dairy products, and lean protein.  Do not eat a lot of foods high in solid fats, added sugars, or salt.  Get regular exercise. This is one of the most important things you can do for your health. ? Most adults should exercise for at least 150 minutes each week. The exercise should increase your heart rate and make you sweat (moderate-intensity exercise). ? Most adults should also do strengthening exercises at least twice a week. This is in addition to the moderate-intensity exercise.  Maintain a healthy weight  Body mass index (BMI) is a measurement that can be used to identify possible weight problems. It estimates body fat based on height and weight. Your health care provider can help determine your BMI and help you achieve or maintain a healthy weight.  For females 76 years of age and older: ? A BMI below 18.5 is considered underweight. ? A BMI of 18.5 to 24.9 is normal. ? A BMI of 25 to 29.9 is considered overweight. ? A BMI of 30 and above is considered obese.  Watch levels of cholesterol and blood lipids  You should  start having your blood tested for lipids and cholesterol at 74 years of age, then have this test every 5 years.  You may need to have your cholesterol levels checked more often if: ? Your lipid or cholesterol levels are high. ? You are older than 74 years of age. ? You are at high risk for heart disease.  Cancer screening Lung Cancer  Lung cancer screening is recommended for adults 3-27 years old who are at high risk for lung cancer because of a history of smoking.  A yearly low-dose CT scan of the lungs is recommended for people  who: ? Currently smoke. ? Have quit within the past 15 years. ? Have at least a 30-pack-year history of smoking. A pack year is smoking an average of one pack of cigarettes a day for 1 year.  Yearly screening should continue until it has been 15 years since you quit.  Yearly screening should stop if you develop a health problem that would prevent you from having lung cancer treatment.  Breast Cancer  Practice breast self-awareness. This means understanding how your breasts normally appear and feel.  It also means doing regular breast self-exams. Let your health care provider know about any changes, no matter how small.  If you are in your 20s or 30s, you should have a clinical breast exam (CBE) by a health care provider every 1-3 years as part of a regular health exam.  If you are 43 or older, have a CBE every year. Also consider having a breast X-ray (mammogram) every year.  If you have a family history of breast cancer, talk to your health care provider about genetic screening.  If you are at high risk for breast cancer, talk to your health care provider about having an MRI and a mammogram every year.  Breast cancer gene (BRCA) assessment is recommended for women who have family members with BRCA-related cancers. BRCA-related cancers include: ? Breast. ? Ovarian. ? Tubal. ? Peritoneal cancers.  Results of the assessment will determine the need for genetic counseling and BRCA1 and BRCA2 testing.  Cervical Cancer Your health care provider may recommend that you be screened regularly for cancer of the pelvic organs (ovaries, uterus, and vagina). This screening involves a pelvic examination, including checking for microscopic changes to the surface of your cervix (Pap test). You may be encouraged to have this screening done every 3 years, beginning at age 42.  For women ages 77-65, health care providers may recommend pelvic exams and Pap testing every 3 years, or they may recommend  the Pap and pelvic exam, combined with testing for human papilloma virus (HPV), every 5 years. Some types of HPV increase your risk of cervical cancer. Testing for HPV may also be done on women of any age with unclear Pap test results.  Other health care providers may not recommend any screening for nonpregnant women who are considered low risk for pelvic cancer and who do not have symptoms. Ask your health care provider if a screening pelvic exam is right for you.  If you have had past treatment for cervical cancer or a condition that could lead to cancer, you need Pap tests and screening for cancer for at least 20 years after your treatment. If Pap tests have been discontinued, your risk factors (such as having a new sexual partner) need to be reassessed to determine if screening should resume. Some women have medical problems that increase the chance of getting cervical cancer. In these cases, your  health care provider may recommend more frequent screening and Pap tests.  Colorectal Cancer  This type of cancer can be detected and often prevented.  Routine colorectal cancer screening usually begins at 74 years of age and continues through 74 years of age.  Your health care provider may recommend screening at an earlier age if you have risk factors for colon cancer.  Your health care provider may also recommend using home test kits to check for hidden blood in the stool.  A small camera at the end of a tube can be used to examine your colon directly (sigmoidoscopy or colonoscopy). This is done to check for the earliest forms of colorectal cancer.  Routine screening usually begins at age 53.  Direct examination of the colon should be repeated every 5-10 years through 74 years of age. However, you may need to be screened more often if early forms of precancerous polyps or small growths are found.  Skin Cancer  Check your skin from head to toe regularly.  Tell your health care provider about  any new moles or changes in moles, especially if there is a change in a mole's shape or color.  Also tell your health care provider if you have a mole that is larger than the size of a pencil eraser.  Always use sunscreen. Apply sunscreen liberally and repeatedly throughout the day.  Protect yourself by wearing long sleeves, pants, a wide-brimmed hat, and sunglasses whenever you are outside.  Heart disease, diabetes, and high blood pressure  High blood pressure causes heart disease and increases the risk of stroke. High blood pressure is more likely to develop in: ? People who have blood pressure in the high end of the normal range (130-139/85-89 mm Hg). ? People who are overweight or obese. ? People who are African American.  If you are 19-79 years of age, have your blood pressure checked every 3-5 years. If you are 55 years of age or older, have your blood pressure checked every year. You should have your blood pressure measured twice-once when you are at a hospital or clinic, and once when you are not at a hospital or clinic. Record the average of the two measurements. To check your blood pressure when you are not at a hospital or clinic, you can use: ? An automated blood pressure machine at a pharmacy. ? A home blood pressure monitor.  If you are between 16 years and 23 years old, ask your health care provider if you should take aspirin to prevent strokes.  Have regular diabetes screenings. This involves taking a blood sample to check your fasting blood sugar level. ? If you are at a normal weight and have a low risk for diabetes, have this test once every three years after 74 years of age. ? If you are overweight and have a high risk for diabetes, consider being tested at a younger age or more often. Preventing infection Hepatitis B  If you have a higher risk for hepatitis B, you should be screened for this virus. You are considered at high risk for hepatitis B if: ? You were born in  a country where hepatitis B is common. Ask your health care provider which countries are considered high risk. ? Your parents were born in a high-risk country, and you have not been immunized against hepatitis B (hepatitis B vaccine). ? You have HIV or AIDS. ? You use needles to inject street drugs. ? You live with someone who has hepatitis B. ?  You have had sex with someone who has hepatitis B. ? You get hemodialysis treatment. ? You take certain medicines for conditions, including cancer, organ transplantation, and autoimmune conditions.  Hepatitis C  Blood testing is recommended for: ? Everyone born from [birth year] through 1967. ? Anyone with known risk factors for hepatitis C.  Sexually transmitted infections (STIs)  You should be screened for sexually transmitted infections (STIs) including gonorrhea and chlamydia if: ? You are sexually active and are younger than 74 years of age. ? You are older than 74 years of age and your health care provider tells you that you are at risk for this type of infection. ? Your sexual activity has changed since you were last screened and you are at an increased risk for chlamydia or gonorrhea. Ask your health care provider if you are at risk.  If you do not have HIV, but are at risk, it may be recommended that you take a prescription medicine daily to prevent HIV infection. This is called pre-exposure prophylaxis (PrEP). You are considered at risk if: ? You are sexually active and do not regularly use condoms or know the HIV status of your partner(s). ? You take drugs by injection. ? You are sexually active with a partner who has HIV.  Talk with your health care provider about whether you are at high risk of being infected with HIV. If you choose to begin PrEP, you should first be tested for HIV. You should then be tested every 3 months for as long as you are taking PrEP. Pregnancy  If you are premenopausal and you may become pregnant, ask your health  care provider about preconception counseling.  If you may become pregnant, take 400 to 800 micrograms (mcg) of folic acid every day.  If you want to prevent pregnancy, talk to your health care provider about birth control (contraception). Osteoporosis and menopause  Osteoporosis is a disease in which the bones lose minerals and strength with aging. This can result in serious bone fractures. Your risk for osteoporosis can be identified using a bone density scan.  If you are 27 years of age or older, or if you are at risk for osteoporosis and fractures, ask your health care provider if you should be screened.  Ask your health care provider whether you should take a calcium or vitamin D supplement to lower your risk for osteoporosis.  Menopause may have certain physical symptoms and risks.  Hormone replacement therapy may reduce some of these symptoms and risks. Talk to your health care provider about whether hormone replacement therapy is right for you. Follow these instructions at home:  Schedule regular health, dental, and eye exams.  Stay current with your immunizations.  Do not use any tobacco products including cigarettes, chewing tobacco, or electronic cigarettes.  If you are pregnant, do not drink alcohol.  If you are breastfeeding, limit how much and how often you drink alcohol.  Limit alcohol intake to no more than 1 drink per day for nonpregnant women. One drink equals 12 ounces of beer, 5 ounces of Kaleiyah Polsky, or 1 ounces of hard liquor.  Do not use street drugs.  Do not share needles.  Ask your health care provider for help if you need support or information about quitting drugs.  Tell your health care provider if you often feel depressed.  Tell your health care provider if you have ever been abused or do not feel safe at home. This information is not intended to replace advice  given to you by your health care provider. Make sure you discuss any questions you have with  your health care provider. Document Released: 07/29/2010 Document Revised: 06/21/2015 Document Reviewed: 10/17/2014 Elsevier Interactive Patient Education  [employer]. It is important to avoid accidents which may result in broken bones.  Here are a few ideas on how to make your home safer so you will be less likely to trip or fall.  1. Use nonskid mats or non slip strips in your shower or tub, on your bathroom floor and around sinks.  If you know that you have spilled water, wipe it up! 2. In the bathroom, it is important to have properly installed grab bars on the walls or on the edge of the tub.  Towel racks are NOT strong enough for you to hold onto or to pull on for support. 3. Stairs and hallways should have enough light.  Add lamps or night lights if you need ore light. 4. It is good to have handrails on both sides of the stairs if possible.  Always fix broken handrails right away. 5. It is important to see the edges of steps.  Paint the edges of outdoor steps white so you can see them better.  Put colored tape on the edge of inside steps. 6. Throw-rugs are dangerous because they can slide.  Removing the rugs is the best idea, but if they must stay, add adhesive carpet tape to prevent slipping. 7. Do not keep things on stairs or in the halls.  Remove small furniture that blocks the halls as it may cause you to trip.  Keep telephone and electrical cords out of the way where you walk. 8. Always were sturdy, rubber-soled shoes for good support.  Never wear just socks, especially on the stairs.  Socks may cause you to slip or fall.  Do not wear full-length housecoats as you can easily trip on the bottom.  9. Place the things you use the most on the shelves that are the easiest to reach.  If you use a stepstool, make sure it is in good condition.  If you feel unsteady, DO NOT climb, ask for help. 10. If a health professional advises you to use a cane or walker, do not be ashamed.  These items  can keep you from falling and breaking your bones.

## 2017-07-02 NOTE — Progress Notes (Addendum)
Subjective:   Michelle Hubbard is a 73 y.o. female who presents for Medicare Annual (Subsequent) preventive examination.  Review of Systems:  No ROS.  Medicare Wellness Visit. Additional risk factors are reflected in the social history.  Cardiac Risk Factors include: advanced age (>65men, >60 women);smoking/ tobacco exposure;hypertension Sleep patterns: has frequent nighttime awakenings, gets up 1-2 times nightly to void and sleeps 5 hours nightly.  Patient reports insomnia issues, discussed recommended sleep tips.   Home Safety/Smoke Alarms: Feels safe in home. Smoke alarms in place.  Living environment; residence and Firearm Safety: 1-story house/ trailer, no firearms. Lives alone, no needs for DME, good support system Seat Belt Safety/Bike Helmet: Wears seat belt.     Objective:     Vitals: BP 138/72   Pulse 78   Resp 18   Ht 5\' 5"  (1.651 m)   Wt 139 lb (63 kg)   SpO2 98%   BMI 23.13 kg/m   Body mass index is 23.13 kg/m.  Advanced Directives 07/02/2017 07/21/2016 06/19/2016 11/28/2014 06/03/2013 06/03/2013 05/27/2013  Does Patient Have a Medical Advance Directive? Yes No Yes Yes Patient has advance directive, copy not in chart (No Data) Patient has advance directive, copy not in chart  Type of Advance Directive Healthcare Power of [place];Living will - Healthcare Power of [place];Living will - Living will - Living will  Does patient want to make changes to medical advance directive? - - - - - No change requested No change requested  Copy of Healthcare Power of Attorney in Chart? No - copy requested - No - copy requested - Copy requested from family - Copy requested from family  Pre-existing out of facility DNR order (yellow form or pink MOST form) - - - - No - No    Tobacco Social History   Tobacco Use  Smoking Status Current Every Day Smoker  . Packs/day: 1.00  . Years: 44.00  . Pack years: 44.00  . Types: Cigarettes  Smokeless Tobacco Never Used  Tobacco Comment   smoking more due to move/ trying to quit     Ready to quit: Not Answered Counseling given: Not Answered Comment: smoking more due to move/ trying to quit  Past Medical History:  Diagnosis Date  . 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 COPD 01/08/2010  . DEPRESSIVE DISORDER 12/23/2006  . FATIGUE 01/08/2010  . HYPERLIPIDEMIA 01/08/2010  . LOW BACK PAIN 12/23/2006  . OAB (overactive bladder) 10/07/2011  . OSTEOPOROSIS 01/08/2010  . 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 PONV (postoperative nausea and vomiting)   . SINUSITIS- ACUTE-NOS 07/09/2007  . Urticaria   . Vitamin D deficiency 04/30/2010   Past Surgical History:  Procedure Laterality Date  . ABDOMINAL HYSTERECTOMY    . APPENDECTOMY  1988  . BREAST BIOPSY  1986  . COLON RESECTION N/A 06/03/2013   Procedure: LAPAROSCOPY DIAGNOSTIC and OPEN  SIGMOID COLON RESECTION ;  Surgeon: Axel Filler, MD;  Location: [facility];  Service: General;  Laterality: N/A;  . COLON SURGERY  2000   colon obstruction  . larygenscopy    . OOPHORECTOMY  bilat 1988   secondary to cyst   Family History  Problem Relation Age of Onset  . Colon cancer Mother   . Colon cancer Brother   . Diabetes Maternal Aunt   . Diabetes Maternal Grandmother   . Allergic rhinitis Neg Hx   . Angioedema Neg Hx   . Asthma Neg Hx   . Eczema Neg Hx   .  Immunodeficiency Neg Hx   . Urticaria Neg Hx    Social History   Socioeconomic History  . Marital status: Widowed    Spouse name: Not on file  . Number of children: 1  . Years of education: Not on file  . Highest education level: Not on file  Occupational History  . 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 Financial resource strain: Not hard at all  . Food insecurity:    Worry: Never true    Inability: Not on file  . Transportation needs:    Medical: No    Non-medical: No  Tobacco Use  . Smoking status: Current Every Day Smoker    Packs/day: 1.00     Years: 44.00    Pack years: 44.00    Types: Cigarettes  . Smokeless tobacco: Never Used  . Tobacco comment: smoking more due to move/ trying to quit  Substance and Sexual Activity  . Alcohol use: Yes    Alcohol/week: 0.0 oz    Comment: vodka nightly  . Drug use: No  . Sexual activity: Not Currently  Lifestyle  . Physical activity:    Days per week: 3 days    Minutes per session: 70 min  . Stress: To some extent  Relationships  . Social connections:    Talks on phone: More than three times a week    Gets together: More than three times a week    Attends religious service: More than 4 times per year    Active member of club or organization: Yes    Attends meetings of clubs or organizations: More than 4 times per year    Relationship status: Widowed  Other Topics Concern  . Not on file  Social History Narrative  . Not on file    Outpatient Encounter Medications as of 07/02/2017  Medication Sig  . antiseptic oral rinse (BIOTENE) LIQD 15 mLs by Mouth Rinse route as needed for dry mouth.  Marland Kitchen aspirin 325 MG tablet Take 325 mg by mouth daily.  . Flaxseed, Linseed, (EQL FLAX SEED OIL PO) Take 5 mLs by mouth.  . levocetirizine (XYZAL) 5 MG tablet Take 5 mg by mouth every evening.  Marland Kitchen omeprazole (PRILOSEC) 10 MG capsule Take 10 mg by mouth as needed.  . Saline 0.65 % (Soln) SOLN Place 1 spray into the nose as needed.  . temazepam (RESTORIL) 15 MG capsule TAKE 1 TO 2 CAPSULES BY MOUTH AT BEDTIME AS NEEDED  . triamcinolone (NASACORT ALLERGY 24HR) 55 MCG/ACT AERO nasal inhaler Place 2 sprays into the nose daily.  . [DISCONTINUED] clobetasol cream (TEMOVATE) 0.05 % Apply 1 application topically 2 (two) times daily. (Patient not taking: Reported on 07/02/2017)  . [DISCONTINUED] gabapentin (NEURONTIN) 100 MG capsule Take 1 capsule (100 mg total) by mouth at bedtime. (Patient not taking: Reported on 07/02/2017)  . [DISCONTINUED] valACYclovir (VALTREX) 1000 MG tablet Take 1 tablet (1,000 mg total) by  mouth 3 (three) times daily. (Patient not taking: Reported on 07/02/2017)   No facility-administered encounter medications on file as of 07/02/2017.     Activities of Daily Living In your present state of health, do you have any difficulty performing the following activities: 07/02/2017  Hearing? N  Vision? N  Difficulty concentrating or making decisions? N  Walking or climbing stairs? N  Dressing or bathing? N  Doing errands, shopping? N  Preparing Food and eating ? N  Using the Toilet? N  In the past  six months, have you accidently leaked urine? Y  Do you have problems with loss of bowel control? N  Managing your Medications? N  Managing your Finances? N  Housekeeping or managing your Housekeeping? N  Some recent data might be hidden    Patient Care Team: Corwin LevinsJohn, James W, MD as PCP - General    Assessment:   This is a routine wellness examination for [place]. Physical assessment deferred to PCP.   Exercise Activities and Dietary recommendations Current Exercise Habits: Structured exercise class;Home exercise routine, Type of exercise: walking;calisthenics;strength training/weights, Time (Minutes): 50, Frequency (Times/Week): 4, Weekly Exercise (Minutes/Week): 200, Intensity: Mild  Diet (meal preparation, eat out, water intake, caffeinated beverages, dairy products, fruits and vegetables): in general, a "healthy" diet  , well balanced, eats a variety of fruits and vegetables daily, limits salt, fat/cholesterol, sugar,carbohydrates,caffeine, drinks 6-8 glasses of water daily.  Goals    . Patient Stated     I want to try to stop smoking, I am thinking about how and planning.    . Quit smoking / using tobacco     Smoking;  Educated to avoid secondary smoke [facility]: [phone] Classes offered a couple of times a month;  Will work with the patient as far as registration and location Meds may help; chatix (Varenicline); Zyban (Bupropion SR); Nicotine Replacement  (gum; lozenges; patches; etc.) 30 pack yr smoking hx: Educated regarding LDCT; To discuss with MD at next fup.         Fall Risk Fall Risk  07/02/2017 06/19/2016 11/28/2014 12/20/2013  Falls in the past year? No No No No  Comment - - no falls; c/o apt to review dizziness -    Depression Screen PHQ 2/9 Scores 07/02/2017 06/19/2016 11/28/2014 12/20/2013  PHQ - 2 Score 1 0 0 0  PHQ- 9 Score 5 3 - -     Cognitive Function MMSE - Mini Mental State Exam 11/28/2014  Not completed: (No Data)       Ad8 score reviewed for issues:  Issues making decisions: no  Less interest in hobbies / activities: no  Repeats questions, stories (family complaining): no  Trouble using ordinary gadgets (microwave, computer, phone):no  Forgets the month or year: no  Mismanaging finances: no  Remembering appts: no  Daily problems with thinking and/or memory: no Ad8 score is= 0    Immunization History  Administered Date(s) Administered  . Influenza Split 10/07/2011  . Influenza Whole 01/08/2010  . Influenza, High Dose Seasonal PF 11/28/2014  . Influenza,inj,Quad PF,6+ Mos 12/20/2013, 11/28/2015  . Pneumococcal Conjugate-13 12/28/2014  . Pneumococcal Polysaccharide-23 01/08/2010  . Tdap 10/07/2011    Screening Tests Health Maintenance  Topic Date Due  . MAMMOGRAM  02/13/2017  . INFLUENZA VACCINE  08/27/2017  . TETANUS/TDAP  10/06/2021  . COLONOSCOPY  05/12/2022  . DEXA SCAN  Completed  . Hepatitis C Screening  Completed  . PNA vac Low Risk Adult  Completed      Plan:     Continue doing brain stimulating activities (puzzles, reading, adult coloring books, staying active) to keep memory sharp.   Continue to eat heart healthy diet (full of fruits, vegetables, whole grains, lean protein, water--limit salt, fat, and sugar intake) and increase physical activity as tolerated.   I have personally reviewed and noted the following in the patient's chart:   . Medical and social  history . Use of alcohol, tobacco or illicit drugs  . Current medications and supplements . Functional ability and status .  Nutritional status . Physical activity . Advanced directives . List of other physicians . Vitals . Screenings to include cognitive, depression, and falls . Referrals and appointments  In addition, I have reviewed and discussed with patient certain preventive protocols, quality metrics, and best practice recommendations. A written personalized care plan for preventive services as well as general preventive health recommendations were provided to patient.     Wanda Plump, RN  07/02/2017  Medical screening examination/treatment/procedure(s) were performed by non-physician practitioner and as supervising physician I was immediately available for consultation/collaboration. I agree with above. Oliver Barre, MD

## 2017-07-20 ENCOUNTER — Other Ambulatory Visit (INDEPENDENT_AMBULATORY_CARE_PROVIDER_SITE_OTHER): Payer: Medicare Other

## 2017-07-20 ENCOUNTER — Ambulatory Visit (INDEPENDENT_AMBULATORY_CARE_PROVIDER_SITE_OTHER): Payer: Medicare Other | Admitting: Internal Medicine

## 2017-07-20 ENCOUNTER — Encounter: Payer: Self-pay | Admitting: Internal Medicine

## 2017-07-20 VITALS — BP 126/86 | HR 79 | Temp 98.3°F | Ht 65.0 in | Wt 137.0 lb

## 2017-07-20 DIAGNOSIS — M25561 Pain in right knee: Secondary | ICD-10-CM

## 2017-07-20 DIAGNOSIS — G8929 Other chronic pain: Secondary | ICD-10-CM | POA: Diagnosis not present

## 2017-07-20 DIAGNOSIS — J438 Other emphysema: Secondary | ICD-10-CM

## 2017-07-20 DIAGNOSIS — F329 Major depressive disorder, single episode, unspecified: Secondary | ICD-10-CM

## 2017-07-20 DIAGNOSIS — E785 Hyperlipidemia, unspecified: Secondary | ICD-10-CM | POA: Diagnosis not present

## 2017-07-20 DIAGNOSIS — F32A Depression, unspecified: Secondary | ICD-10-CM

## 2017-07-20 LAB — BASIC METABOLIC PANEL
BUN: 15 mg/dL (ref 6–23)
CO2: 25 mEq/L (ref 19–32)
Calcium: 9.4 mg/dL (ref 8.4–10.5)
Chloride: 104 mEq/L (ref 96–112)
Creatinine, Ser: 0.67 mg/dL (ref 0.40–1.20)
GFR: 91.57 mL/min (ref >60.00)
Glucose, Bld: 96 mg/dL (ref 70–99)
Potassium: 3.7 mEq/L (ref 3.5–5.1)
Sodium: 138 mEq/L (ref 135–145)

## 2017-07-20 LAB — CBC WITH DIFFERENTIAL/PLATELET
Basophils Absolute: 0 10*3/uL (ref 0.0–0.1)
Basophils Relative: 0.7 % (ref 0.0–3.0)
Eosinophils Absolute: 0.3 10*3/uL (ref 0.0–0.7)
Eosinophils Relative: 3.8 % (ref 0.0–5.0)
HCT: 43.2 % (ref 36.0–46.0)
Hemoglobin: 14.5 g/dL (ref 12.0–15.0)
Lymphocytes Relative: 24.8 % (ref 12.0–46.0)
Lymphs Abs: 1.7 10*3/uL (ref 0.7–4.0)
MCHC: 33.6 g/dL (ref 30.0–36.0)
MCV: 92.1 fl (ref 78.0–100.0)
Monocytes Absolute: 0.6 10*3/uL (ref 0.1–1.0)
Monocytes Relative: 8.9 % (ref 3.0–12.0)
Neutro Abs: 4.2 10*3/uL (ref 1.4–7.7)
Neutrophils Relative %: 61.8 % (ref 43.0–77.0)
Platelets: 322 10*3/uL (ref 150.0–400.0)
RBC: 4.69 Mil/uL (ref 3.87–5.11)
RDW: 13.9 % (ref 11.5–15.5)
WBC: 6.8 10*3/uL (ref 4.0–10.5)

## 2017-07-20 LAB — URINALYSIS, ROUTINE W REFLEX MICROSCOPIC
Bilirubin Urine: NEGATIVE
Hgb urine dipstick: NEGATIVE
Ketones, ur: NEGATIVE
Leukocytes, UA: NEGATIVE
Nitrite: NEGATIVE
Specific Gravity, Urine: <=1.005 — AB (ref 1.000–1.030)
Total Protein, Urine: NEGATIVE
Urine Glucose: NEGATIVE
Urobilinogen, UA: 0.2 (ref 0.0–1.0)
pH: 5.5 (ref 5.0–8.0)

## 2017-07-20 LAB — LIPID PANEL
Cholesterol: 181 mg/dL (ref 0–200)
HDL: 53 mg/dL (ref >39.00)
LDL Cholesterol: 97 mg/dL (ref 0–99)
NonHDL: 128.29
Total CHOL/HDL Ratio: 3
Triglycerides: 155 mg/dL — ABNORMAL HIGH (ref 0.0–149.0)
VLDL: 31 mg/dL (ref 0.0–40.0)

## 2017-07-20 LAB — HEPATIC FUNCTION PANEL
ALT: 12 U/L (ref 0–35)
AST: 13 U/L (ref 0–37)
Albumin: 4.3 g/dL (ref 3.5–5.2)
Alkaline Phosphatase: 65 U/L (ref 39–117)
Bilirubin, Direct: 0.1 mg/dL (ref 0.0–0.3)
Total Bilirubin: 0.3 mg/dL (ref 0.2–1.2)
Total Protein: 7.5 g/dL (ref 6.0–8.3)

## 2017-07-20 LAB — TSH: TSH: 1.03 u[IU]/mL (ref 0.35–4.50)

## 2017-07-20 MED ORDER — TEMAZEPAM 15 MG PO CAPS: ORAL_CAPSULE | ORAL | 5 refills | Status: DC

## 2017-07-20 MED ORDER — CITALOPRAM HYDROBROMIDE 10 MG PO TABS: 10.0000 mg | ORAL_TABLET | Freq: Every day | ORAL | 3 refills | Status: DC

## 2017-07-20 NOTE — Patient Instructions (Addendum)
Please make appt with Dr Katrinka BlazingSmith Margaretmary Bayley/sports medicine for the right knee  Please take all new medication as prescribed - the celexa 10 mg per day  Please continue all other medications as before, and refills have been done if requested - temazepam  Please have the pharmacy call with any other refills you may need.  Please continue your efforts at being more active, low cholesterol diet, and weight control.  You are otherwise up to date with prevention measures today.  Please keep your appointments with your specialists as you may have planned  Please go to the LAB in the Basement (turn left off the elevator) for the tests to be done today  You will be contacted by phone if any changes need to be made immediately.  Otherwise, you will receive a letter about your results with an explanation, but please check with MyChart first.  Please remember to sign up for MyChart if you have not done so, as this will be important to you in the future with finding out test results, communicating by private email, and scheduling acute appointments online when needed.  Please return in 1 year for your yearly visit, or sooner if needed

## 2017-07-20 NOTE — Progress Notes (Signed)
Subjective:    Patient ID: Michelle SkainsNancy K Peel, female    DOB: 09-Jun-1945, 72 y.o.   MRN: [id number]  HPI  Here to f/u; overall doing ok,  Pt denies chest pain, increasing sob or doe, wheezing, orthopnea, PND, increased LE swelling, palpitations, dizziness or syncope.  Pt denies new neurological symptoms such as new headache, or facial or extremity weakness or numbness.  Pt denies polydipsia, polyuria, or low sugar episode.  Pt states overall good compliance with meds, mostly trying to follow appropriate diet, with wt overall stable,  but little exercise however.  C/o 2 wks onset right knee pain anterior as well with intermittent swelling, without giveaways or falls.  Denies worsening depressive symptoms, suicidal ideation, or panic; has ongoing anxiety, some increased recently for unclear reasons.  No SI or HI Past Medical History:  Diagnosis Date  . 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 COPD 01/08/2010  . DEPRESSIVE DISORDER 12/23/2006  . FATIGUE 01/08/2010  . HYPERLIPIDEMIA 01/08/2010  . LOW BACK PAIN 12/23/2006  . OAB (overactive bladder) 10/07/2011  . OSTEOPOROSIS 01/08/2010  . 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 PONV (postoperative nausea and vomiting)   . SINUSITIS- ACUTE-NOS 07/09/2007  . Urticaria   . Vitamin D deficiency 04/30/2010   Past Surgical History:  Procedure Laterality Date  . ABDOMINAL HYSTERECTOMY    . APPENDECTOMY  1988  . BREAST BIOPSY  1986  . COLON RESECTION N/A 06/03/2013   Procedure: LAPAROSCOPY DIAGNOSTIC and OPEN  SIGMOID COLON RESECTION ;  Surgeon: Axel FillerArmando Ramirez, MD;  Location: [facility];  Service: General;  Laterality: N/A;  . COLON SURGERY  2000   colon obstruction  . larygenscopy    . OOPHORECTOMY  bilat 1988   secondary to cyst    reports that she has been smoking cigarettes.  She has a 44.00 pack-year smoking history. She has never used smokeless tobacco. She reports that she drinks alcohol. She reports that she does not use  drugs. family history includes Colon cancer in her brother and mother; Diabetes in her maternal aunt and maternal grandmother. Allergies  Allergen Reactions  . Codeine     REACTION: nausea  . Lortab [Hydrocodone-Acetaminophen] Hives  . Penicillins    Current Outpatient Medications on File Prior to Visit  Medication Sig Dispense Refill  . antiseptic oral rinse (BIOTENE) LIQD 15 mLs by Mouth Rinse route as needed for dry mouth.    Marland Kitchen. aspirin 325 MG tablet Take 325 mg by mouth daily.    . Flaxseed, Linseed, (EQL FLAX SEED OIL PO) Take 5 mLs by mouth.    . levocetirizine (XYZAL) 5 MG tablet Take 5 mg by mouth every evening.    Marland Kitchen. omeprazole (PRILOSEC) 10 MG capsule Take 10 mg by mouth as needed.    . Saline 0.65 % (Soln) SOLN Place 1 spray into the nose as needed.    . triamcinolone (NASACORT ALLERGY 24HR) 55 MCG/ACT AERO nasal inhaler Place 2 sprays into the nose daily.     No current facility-administered medications on file prior to visit.    Review of Systems  Constitutional: Negative for other unusual diaphoresis or sweats HENT: Negative for ear discharge or swelling Eyes: Negative for other worsening visual disturbances Respiratory: Negative for stridor or other swelling  Gastrointestinal: Negative for worsening distension or other blood Genitourinary: Negative for retention or other urinary change Musculoskeletal: Negative for other MSK pain or swelling Skin: Negative for color change or other new lesions Neurological: Negative for  worsening tremors and other numbness  Psychiatric/Behavioral: Negative for worsening agitation or other fatigue All other system neg per pt    Objective:   Physical Exam BP 126/86   Pulse 79   Temp 98.3 F (36.8 C) (Oral)   Ht 5\' 5"  (1.651 m)   Wt 137 lb (62.1 kg)   SpO2 97%   BMI 22.80 kg/m  VS noted,  Constitutional: Pt appears in NAD HENT: Head: NCAT.  Right Ear: External ear normal.  Left Ear: External ear normal.  Eyes: . Pupils are  equal, round, and reactive to light. Conjunctivae and EOM are normal Nose: without d/c or deformity Neck: Neck supple. Gross normal ROM Cardiovascular: Normal rate and regular rhythm.   Pulmonary/Chest: Effort normal and breath sounds without rales or wheezing.  Abd:  Soft, NT, ND, + BS, no organomegaly Neurological: Pt is alert. At baseline orientation, motor grossly intact Skin: Skin is warm. No rashes, other new lesions, no LE edema Psychiatric: Pt behavior is normal without agitation , 1-2+ nervous No other exam findings    Assessment & Plan:

## 2017-07-22 NOTE — Assessment & Plan Note (Signed)
stable overall by history and exam, recent data reviewed with pt, and pt to continue medical treatment as before,  to f/u any worsening symptoms or concerns Lab Results  Component Value Date   LDLCALC 97 07/20/2017

## 2017-07-22 NOTE — Assessment & Plan Note (Signed)
Pt encourage to see Sports medicine in this office

## 2017-07-22 NOTE — Assessment & Plan Note (Signed)
stable overall by history and exam, recent data reviewed with pt, and pt to continue medical treatment as before,  to f/u any worsening symptoms or concerns, with anxiety will add celexa asd,  to f/u any worsening symptoms or concerns

## 2017-07-22 NOTE — Assessment & Plan Note (Signed)
stable overall by history and exam, recent data reviewed with pt, and pt to continue medical treatment as before,  to f/u any worsening symptoms or concerns  

## 2017-07-28 ENCOUNTER — Other Ambulatory Visit: Payer: Self-pay | Admitting: Internal Medicine

## 2017-07-28 DIAGNOSIS — Z1231 Encounter for screening mammogram for malignant neoplasm of breast: Secondary | ICD-10-CM

## 2017-08-22 NOTE — Progress Notes (Signed)
Tawana ScaleZach Smith D.O. [facility] [address] [place], [place] [address] Phone: [phone] Subjective:      CC: Right knee pain  BJY:[id number]:Subjective  Michelle Hubbard is a 72 y.o. female coming in with complaint of right knee pain of 15 years. She was on a bus trip for 4 hours and then went to [place] after that trip. Was experiencing swelling from knee to toes. Toes were numb and tingling. Pain is posterior aspect and feels like a burning sensation. Aching though throughout the entire joint. Is using tylenol. Has tried stretching which has not helped.  Patient rates the severity of pain now is 4 out of 10 and may be slowly improving but is concerned because still feels like she has swelling.    Past Medical History:  Diagnosis Date  . 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 COPD 01/08/2010  . DEPRESSIVE DISORDER 12/23/2006  . FATIGUE 01/08/2010  . HYPERLIPIDEMIA 01/08/2010  . LOW BACK PAIN 12/23/2006  . OAB (overactive bladder) 10/07/2011  . OSTEOPOROSIS 01/08/2010  . 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 PONV (postoperative nausea and vomiting)   . SINUSITIS- ACUTE-NOS 07/09/2007  . Urticaria   . Vitamin D deficiency 04/30/2010   Past Surgical History:  Procedure Laterality Date  . ABDOMINAL HYSTERECTOMY    . APPENDECTOMY  1988  . BREAST BIOPSY  1986  . COLON RESECTION N/A 06/03/2013   Procedure: LAPAROSCOPY DIAGNOSTIC and OPEN  SIGMOID COLON RESECTION ;  Surgeon: Axel FillerArmando Ramirez, MD;  Location: [facility];  Service: General;  Laterality: N/A;  . COLON SURGERY  2000   colon obstruction  . larygenscopy    . OOPHORECTOMY  bilat 1988   secondary to cyst   Social History   Socioeconomic History  . Marital status: Widowed    Spouse name: Not on file  . Number of children: 1  . Years of education: Not on file  . Highest education level: Not on file  Occupational History  . Occupation: Retired    Associate Professormployer: Designer, [employer]  Social  Needs  . Financial resource strain: Not hard at all  . Food insecurity:    Worry: Never true    Inability: Not on file  . Transportation needs:    Medical: No    Non-medical: No  Tobacco Use  . Smoking status: Current Every Day Smoker    Packs/day: 1.00    Years: 44.00    Pack years: 44.00    Types: Cigarettes  . Smokeless tobacco: Never Used  . Tobacco comment: smoking more due to move/ trying to quit  Substance and Sexual Activity  . Alcohol use: Yes    Alcohol/week: 0.0 oz    Comment: vodka nightly  . Drug use: No  . Sexual activity: Not Currently  Lifestyle  . Physical activity:    Days per week: 3 days    Minutes per session: 70 min  . Stress: To some extent  Relationships  . Social connections:    Talks on phone: More than three times a week    Gets together: More than three times a week    Attends religious service: More than 4 times per year    Active member of club or organization: Yes    Attends meetings of clubs or organizations: More than 4 times per year    Relationship status: Widowed  Other Topics Concern  . Not on file  Social History Narrative  . Not on file  Allergies  Allergen Reactions  . Codeine     REACTION: nausea  . Lortab [Hydrocodone-Acetaminophen] Hives  . Penicillins    Family History  Problem Relation Age of Onset  . Colon cancer Mother   . Colon cancer Brother   . Diabetes Maternal Aunt   . Diabetes Maternal Grandmother   . Allergic rhinitis Neg Hx   . Angioedema Neg Hx   . Asthma Neg Hx   . Eczema Neg Hx   . Immunodeficiency Neg Hx   . Urticaria Neg Hx      Past medical history, social, surgical and family history all reviewed in electronic medical record.  No pertanent information unless stated regarding to the chief complaint.   Review of Systems:Review of systems updated and as accurate as of 08/24/17  No headache, visual changes, nausea, vomiting, diarrhea, constipation, dizziness, abdominal pain, skin rash, fevers,  chills, night sweats, weight loss, swollen lymph nodes, body aches, joint swelling,  chest pain, shortness of breath, mood changes.  Positive muscle aches  Objective  Blood pressure 122/82, pulse 78, height 5\' 5"  (1.651 m), weight 137 lb (62.1 kg), SpO2 94 %. Systems examined below as of 08/24/17   General: No apparent distress alert and oriented x3 mood and affect normal, dressed appropriately.  HEENT: Pupils equal, extraocular movements intact  Respiratory: Patient's speak in full sentences and does not appear short of breath  Cardiovascular: No lower extremity edema, non tender, no erythema  Skin: Warm dry intact with no signs of infection or rash on extremities or on axial skeleton.  Abdomen: Soft nontender  Neuro: Cranial nerves II through XII are intact, neurovascularly intact in all extremities with 2+ DTRs and 2+ pulses.  Lymph: No lymphadenopathy of posterior or anterior cervical chain or axillae bilaterally.  Gait normal with good balance and coordination.  MSK:  Non tender with full range of motion and good stability and symmetric strength and tone of shoulders, elbows, wrist, hip, and ankles bilaterally.  Knee: Right Normal to inspection with no erythema or effusion or obvious bony abnormalities.  May be trace effusion noted T minimal discomfort over the medial joint line ROM full in flexion and extension and lower leg rotation. Ligaments with solid consistent endpoints including ACL, PCL, LCL, MCL. Negative Mcmurray's, Apley's, and Thessalonian tests. painful patellar compression. Patellar glide with mild to moderate crepitus. Patellar and quadriceps tendons unremarkable. Hamstring and quadriceps strength is normal. Contralateral knee unremarkable  MSK US performed of: Right knee This study was ordered, performed, and interpreted by Terrilee Files D.O.  Knee: Patient does have some moderate narrowing of the medial joint space trace effusion of the patellofemoral joint with  some mild calcific changes.  IMPRESSION: Mild patellofemoral arthritis, moderate medial joint space narrowing    Impression and Recommendations:     This case required medical decision making of moderate complexity.      Note: This dictation was prepared with Dragon dictation along with smaller phrase technology. Any transcriptional errors that result from this process are unintentional.

## 2017-08-24 ENCOUNTER — Ambulatory Visit
Admission: RE | Admit: 2017-08-24 | Discharge: 2017-08-24 | Disposition: A | Discharge status: Home / Self Care | Payer: Medicare Other | Source: Ambulatory Visit | Attending: Internal Medicine | Admitting: Internal Medicine

## 2017-08-24 ENCOUNTER — Ambulatory Visit: Payer: Self-pay

## 2017-08-24 ENCOUNTER — Encounter: Payer: Self-pay | Admitting: Family Medicine

## 2017-08-24 ENCOUNTER — Other Ambulatory Visit: Payer: Medicare Other

## 2017-08-24 ENCOUNTER — Ambulatory Visit (INDEPENDENT_AMBULATORY_CARE_PROVIDER_SITE_OTHER)
Admission: RE | Admit: 2017-08-24 | Discharge: 2017-08-24 | Disposition: A | Discharge status: Home / Self Care | Payer: Medicare Other | Source: Ambulatory Visit | Attending: Family Medicine | Admitting: Family Medicine

## 2017-08-24 ENCOUNTER — Ambulatory Visit (INDEPENDENT_AMBULATORY_CARE_PROVIDER_SITE_OTHER): Payer: Medicare Other | Admitting: Family Medicine

## 2017-08-24 VITALS — BP 122/82 | HR 78 | Ht 65.0 in | Wt 137.0 lb

## 2017-08-24 DIAGNOSIS — M25561 Pain in right knee: Secondary | ICD-10-CM | POA: Diagnosis not present

## 2017-08-24 DIAGNOSIS — M1711 Unilateral primary osteoarthritis, right knee: Secondary | ICD-10-CM | POA: Diagnosis not present

## 2017-08-24 DIAGNOSIS — Z1231 Encounter for screening mammogram for malignant neoplasm of breast: Secondary | ICD-10-CM

## 2017-08-24 DIAGNOSIS — G8929 Other chronic pain: Secondary | ICD-10-CM | POA: Diagnosis not present

## 2017-08-24 NOTE — Patient Instructions (Signed)
Good to see you  Overall not bad Ice 20 minutes 2 times daily. Usually after activity and before bed. pennsaid pinkie amount topically 2 times daily as needed.  Over the counter get  Vitamin D 2000 IU daily  Turmeric 500mg  daily  Tart cherry extract any dose at night Stay active.  Exercises 3 times a week.  See me again in 4 weeks if not better

## 2017-08-24 NOTE — Assessment & Plan Note (Signed)
Degenerative right knee but no significant decrease in range of motion.  Denies any numbness or tingling.  Patient given topical anti-inflammatories.  No significant instability noted of the knee.  X-rays ordered today to further evaluate for any other gross deformity that could be contributing to some of the pain.  Slight effusion noted but likely not contributing to the significant amount of the pain.  Icing regimen and over-the-counter medications were recommended.  Follow-up again in 4 weeks

## 2017-08-31 ENCOUNTER — Encounter: Payer: Self-pay | Admitting: Family Medicine

## 2017-09-24 ENCOUNTER — Ambulatory Visit: Payer: Medicare Other | Admitting: Family Medicine

## 2018-01-25 ENCOUNTER — Encounter: Payer: Self-pay | Admitting: Internal Medicine

## 2018-01-26 MED ORDER — TEMAZEPAM 15 MG PO CAPS: ORAL_CAPSULE | ORAL | 2 refills | Status: DC

## 2018-04-26 ENCOUNTER — Encounter: Payer: Self-pay | Admitting: Internal Medicine

## 2018-04-26 MED ORDER — TEMAZEPAM 15 MG PO CAPS: ORAL_CAPSULE | ORAL | 2 refills | Status: DC

## 2018-04-26 NOTE — Telephone Encounter (Signed)
Done erx 

## 2018-05-27 ENCOUNTER — Encounter: Payer: Self-pay | Admitting: Internal Medicine

## 2018-05-27 MED ORDER — CITALOPRAM HYDROBROMIDE 10 MG PO TABS: 10.0000 mg | ORAL_TABLET | Freq: Every day | ORAL | 1 refills | Status: DC

## 2018-05-27 NOTE — Telephone Encounter (Signed)
celexa done erx  Staff to conact pt for f/u ROV July 2020

## 2018-06-25 ENCOUNTER — Other Ambulatory Visit: Payer: Self-pay | Admitting: Physician Assistant

## 2018-06-25 DIAGNOSIS — R1030 Lower abdominal pain, unspecified: Secondary | ICD-10-CM

## 2018-06-29 ENCOUNTER — Ambulatory Visit
Admission: RE | Admit: 2018-06-29 | Discharge: 2018-06-29 | Disposition: A | Discharge status: Home / Self Care | Payer: Medicare Other | Source: Ambulatory Visit | Attending: Physician Assistant | Admitting: Physician Assistant

## 2018-06-29 DIAGNOSIS — R1030 Lower abdominal pain, unspecified: Secondary | ICD-10-CM

## 2018-06-29 MED ORDER — IOPAMIDOL (ISOVUE-300) INJECTION 61%
100.0000 mL | Freq: Once | INTRAVENOUS | Status: AC | PRN
  Administered 2018-06-29: 100 mL via INTRAVENOUS

## 2018-07-21 ENCOUNTER — Encounter: Payer: Self-pay | Admitting: Internal Medicine

## 2018-08-11 ENCOUNTER — Ambulatory Visit: Payer: Medicare Other | Admitting: Internal Medicine

## 2018-08-12 ENCOUNTER — Ambulatory Visit (INDEPENDENT_AMBULATORY_CARE_PROVIDER_SITE_OTHER): Payer: Medicare Other | Admitting: Internal Medicine

## 2018-08-12 ENCOUNTER — Other Ambulatory Visit: Payer: Self-pay

## 2018-08-12 ENCOUNTER — Ambulatory Visit (INDEPENDENT_AMBULATORY_CARE_PROVIDER_SITE_OTHER): Payer: Medicare Other | Admitting: *Deleted

## 2018-08-12 ENCOUNTER — Other Ambulatory Visit (INDEPENDENT_AMBULATORY_CARE_PROVIDER_SITE_OTHER): Payer: Medicare Other

## 2018-08-12 ENCOUNTER — Encounter: Payer: Self-pay | Admitting: Internal Medicine

## 2018-08-12 ENCOUNTER — Other Ambulatory Visit: Payer: Self-pay | Admitting: Internal Medicine

## 2018-08-12 VITALS — BP 134/74 | HR 85 | Temp 97.7°F | Ht 65.0 in | Wt 139.0 lb

## 2018-08-12 VITALS — BP 134/75 | HR 85 | Temp 97.7°F | Resp 17 | Ht 65.0 in | Wt 139.0 lb

## 2018-08-12 DIAGNOSIS — R739 Hyperglycemia, unspecified: Secondary | ICD-10-CM

## 2018-08-12 DIAGNOSIS — Z0000 Encounter for general adult medical examination without abnormal findings: Secondary | ICD-10-CM | POA: Diagnosis not present

## 2018-08-12 DIAGNOSIS — E559 Vitamin D deficiency, unspecified: Secondary | ICD-10-CM

## 2018-08-12 DIAGNOSIS — F17200 Nicotine dependence, unspecified, uncomplicated: Secondary | ICD-10-CM

## 2018-08-12 DIAGNOSIS — E611 Iron deficiency: Secondary | ICD-10-CM

## 2018-08-12 DIAGNOSIS — E538 Deficiency of other specified B group vitamins: Secondary | ICD-10-CM

## 2018-08-12 DIAGNOSIS — J438 Other emphysema: Secondary | ICD-10-CM | POA: Diagnosis not present

## 2018-08-12 DIAGNOSIS — R918 Other nonspecific abnormal finding of lung field: Secondary | ICD-10-CM | POA: Diagnosis not present

## 2018-08-12 DIAGNOSIS — E785 Hyperlipidemia, unspecified: Secondary | ICD-10-CM

## 2018-08-12 LAB — BASIC METABOLIC PANEL
BUN: 13 mg/dL (ref 6–23)
CO2: 29 mEq/L (ref 19–32)
Calcium: 9.2 mg/dL (ref 8.4–10.5)
Chloride: 103 mEq/L (ref 96–112)
Creatinine, Ser: 0.69 mg/dL (ref 0.40–1.20)
GFR: 83.04 mL/min (ref >60.00)
Glucose, Bld: 96 mg/dL (ref 70–99)
Potassium: 4.1 mEq/L (ref 3.5–5.1)
Sodium: 138 mEq/L (ref 135–145)

## 2018-08-12 LAB — IBC PANEL
Iron: 91 ug/dL (ref 42–145)
Saturation Ratios: 22.6 % (ref 20.0–50.0)
Transferrin: 287 mg/dL (ref 212.0–360.0)

## 2018-08-12 LAB — LIPID PANEL
Cholesterol: 189 mg/dL (ref 0–200)
HDL: 50.9 mg/dL (ref >39.00)
LDL Cholesterol: 115 mg/dL — ABNORMAL HIGH (ref 0–99)
NonHDL: 137.96
Total CHOL/HDL Ratio: 4
Triglycerides: 113 mg/dL (ref 0.0–149.0)
VLDL: 22.6 mg/dL (ref 0.0–40.0)

## 2018-08-12 LAB — VITAMIN D 25 HYDROXY (VIT D DEFICIENCY, FRACTURES): VITD: 27.63 ng/mL — ABNORMAL LOW (ref 30.00–100.00)

## 2018-08-12 LAB — VITAMIN B12: Vitamin B-12: 354 pg/mL (ref 211–911)

## 2018-08-12 LAB — TSH: TSH: 1.88 u[IU]/mL (ref 0.35–4.50)

## 2018-08-12 LAB — HEMOGLOBIN A1C: Hgb A1c MFr Bld: 6 % (ref 4.6–6.5)

## 2018-08-12 MED ORDER — BETAMETHASONE VALERATE 0.1 % EX CREA: TOPICAL_CREAM | Freq: Two times a day (BID) | CUTANEOUS | 2 refills | Status: DC

## 2018-08-12 MED ORDER — TEMAZEPAM 15 MG PO CAPS: ORAL_CAPSULE | ORAL | 5 refills | Status: DC

## 2018-08-12 MED ORDER — CITALOPRAM HYDROBROMIDE 10 MG PO TABS: 10.0000 mg | ORAL_TABLET | Freq: Every day | ORAL | 3 refills | Status: DC

## 2018-08-12 MED ORDER — VITAMIN D (ERGOCALCIFEROL) 1.25 MG (50000 UNIT) PO CAPS: 50000.0000 [IU] | ORAL_CAPSULE | ORAL | 0 refills | Status: DC

## 2018-08-12 NOTE — Progress Notes (Addendum)
Subjective:   Michelle Hubbard is a 73 y.o. female who presents for Medicare Annual (Subsequent) preventive examination.  Review of Systems:   Cardiac Risk Factors include: advanced age (>57men, >7 women);dyslipidemia Sleep patterns: feels rested on waking, gets up 1 times nightly to void and sleeps 8 hours nightly.    Home Safety/Smoke Alarms: Feels safe in home. Smoke alarms in place.  Living environment; residence and Firearm Safety: 2-story house. Lives alone, no needs for DME, good support system. Seat Belt Safety/Bike Helmet: Wears seat belt.     Objective:     Vitals: BP 134/75   Pulse 85   Temp 97.7 F (36.5 C)   Resp 17   Ht 5\' 5"  (1.651 m)   Wt 139 lb (63 kg)   SpO2 97%   BMI 23.13 kg/m   Body mass index is 23.13 kg/m.  Advanced Directives 08/12/2018 07/02/2017 07/21/2016 06/19/2016 11/28/2014 06/03/2013 06/03/2013  Does Patient Have a Medical Advance Directive? Yes Yes No Yes Yes Patient has advance directive, copy not in chart (No Data)  Type of Advance Directive [place];Living will [place];Living will - [place];Living will - Living will -  Does patient want to make changes to medical advance directive? - - - - - - No change requested  Copy of [place] in Chart? No - copy requested No - copy requested - No - copy requested - Copy requested from family -  Pre-existing out of facility DNR order (yellow form or pink MOST form) - - - - - No -    Tobacco Social History   Tobacco Use  Smoking Status Current Every Day Smoker  . Packs/day: 1.00  . Years: 44.00  . Pack years: 44.00  . Types: Cigarettes  Smokeless Tobacco Never Used  Tobacco Comment   smoking more due to move/ trying to quit     Ready to quit: No Counseling given: No Comment: smoking more due to move/ trying to quit  Past Medical History:  Diagnosis Date  . 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 COPD  01/08/2010  . DEPRESSIVE DISORDER 12/23/2006  . FATIGUE 01/08/2010  . HYPERLIPIDEMIA 01/08/2010  . LOW BACK PAIN 12/23/2006  . OAB (overactive bladder) 10/07/2011  . OSTEOPOROSIS 01/08/2010  . 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 PONV (postoperative nausea and vomiting)   . SINUSITIS- ACUTE-NOS 07/09/2007  . Urticaria   . Vitamin D deficiency 04/30/2010   Past Surgical History:  Procedure Laterality Date  . ABDOMINAL HYSTERECTOMY    . APPENDECTOMY  1988  . BREAST BIOPSY  1986  . COLON RESECTION N/A 06/03/2013   Procedure: LAPAROSCOPY DIAGNOSTIC and OPEN  SIGMOID COLON RESECTION ;  Surgeon: Ralene Ok, MD;  Location: [facility];  Service: General;  Laterality: N/A;  . COLON SURGERY  2000   colon obstruction  . larygenscopy    . [place]   secondary to cyst   Family History  Problem Relation Age of Onset  . Colon cancer Mother   . Colon cancer Brother   . Diabetes Maternal Aunt   . Diabetes Maternal Grandmother   . Allergic rhinitis Neg Hx   . Angioedema Neg Hx   . Asthma Neg Hx   . Eczema Neg Hx   . Immunodeficiency Neg Hx   . Urticaria Neg Hx    Social History   Socioeconomic History  . Marital status: Widowed  Spouse name: Not on file  . Number of children: 3  . Years of education: Not on file  . Highest education level: Not on file  Occupational History  . Occupation: Retired    Associate Professormployer: Designer, [employer]  Social Needs  . Financial resource strain: Not hard at all  . Food insecurity    Worry: Never true    Inability: Not on file  . Transportation needs    Medical: No    Non-medical: No  Tobacco Use  . Smoking status: Current Every Day Smoker    Packs/day: 1.00    Years: 44.00    Pack years: 44.00    Types: Cigarettes  . Smokeless tobacco: Never Used  . Tobacco comment: smoking more due to move/ trying to quit  Substance and Sexual Activity  . Alcohol use: Yes    Alcohol/week: 0.0 standard drinks    Comment: vodka  nightly  . Drug use: No  . Sexual activity: Not Currently  Lifestyle  . Physical activity    Days per week: 3 days    Minutes per session: 70 min  . Stress: Not at all  Relationships  . Social connections    Talks on phone: More than three times a week    Gets together: More than three times a week    Attends religious service: More than 4 times per year    Active member of club or organization: Yes    Attends meetings of clubs or organizations: More than 4 times per year    Relationship status: Widowed  Other Topics Concern  . Not on file  Social History Narrative  . Not on file    Outpatient Encounter Medications as of 08/12/2018  Medication Sig  . antiseptic oral rinse (BIOTENE) LIQD 15 mLs by Mouth Rinse route as needed for dry mouth.  Marland Kitchen. aspirin 325 MG tablet Take 325 mg by mouth daily.   . citalopram (CELEXA) 10 MG tablet Take 1 tablet (10 mg total) by mouth daily.  Marland Kitchen. levocetirizine (XYZAL) 5 MG tablet Take 5 mg by mouth every evening.  Marland Kitchen. omeprazole (PRILOSEC) 10 MG capsule Take 10 mg by mouth as needed.  . polyethylene glycol (MIRALAX / GLYCOLAX) 17 g packet Take 17 g by mouth daily.  . Probiotic Product (ALIGN PO) Take 1 tablet by mouth.  . Saline 0.65 % (Soln) SOLN Place 1 spray into the nose as needed.  . temazepam (RESTORIL) 15 MG capsule TAKE 2 CAPSULES BY MOUTH AT BEDTIME AS NEEDED  . triamcinolone (NASACORT ALLERGY 24HR) 55 MCG/ACT AERO nasal inhaler Place 2 sprays into the nose daily.  . Flaxseed, Linseed, (EQL FLAX SEED OIL PO) Take 5 mLs by mouth.   No facility-administered encounter medications on file as of 08/12/2018.     Activities of Daily Living In your present state of health, do you have any difficulty performing the following activities: 08/12/2018  Hearing? N  Vision? N  Difficulty concentrating or making decisions? N  Walking or climbing stairs? N  Dressing or bathing? N  Doing errands, shopping? N  Preparing Food and eating ? N  Using the  Toilet? N  In the past six months, have you accidently leaked urine? N  Do you have problems with loss of bowel control? N  Managing your Medications? N  Managing your Finances? N  Housekeeping or managing your Housekeeping? N  Some recent data might be hidden    Patient Care Team: Corwin LevinsJohn, James W, MD as PCP -  General    Assessment:   This is a routine wellness examination for Michelle Hubbard. Physical assessment deferred to PCP.  Exercise Activities and Dietary recommendations Current Exercise Habits: Home exercise routine, Type of exercise: walking, Time (Minutes): 40, Frequency (Times/Week): 4, Weekly Exercise (Minutes/Week): 160, Intensity: Mild, Exercise limited by: None identified  Diet (meal preparation, eat out, water intake, caffeinated beverages, dairy products, fruits and vegetables): in general, a "healthy" diet  .   Reviewed heart healthy diet Encouraged patient to increase daily water and healthy fluid intake.  Goals    . Maintain current health status     Continue to be active exercise, eat healthy, enjoy life and family       Fall Risk Fall Risk  08/12/2018 07/20/2017 07/02/2017 06/19/2016 11/28/2014  Falls in the past year? 0 No No No No  Comment - - - - no falls; c/o apt to review dizziness  Number falls in past yr: 0 - - - -    Depression Screen PHQ 2/9 Scores 08/12/2018 07/20/2017 07/02/2017 06/19/2016  PHQ - 2 Score 0 0 1 0  PHQ- 9 Score 1 0 5 3     Cognitive Function MMSE - Mini Mental State Exam 08/12/2018 11/28/2014  Not completed: - (No Data)  Orientation to time 5 -  Orientation to Place 5 -  Registration 3 -  Attention/ Calculation 5 -  Recall 3 -  Language- name 2 objects 2 -  Language- repeat 1 -  Language- follow 3 step command 3 -  Language- read & follow direction 1 -  Write a sentence 1 -  Copy design 1 -  Total score 30 -        Immunization History  Administered Date(s) Administered  . Influenza Split 10/07/2011  . Influenza Whole 01/08/2010   . Influenza, High Dose Seasonal PF 11/28/2014  . Influenza,inj,Quad PF,6+ Mos 12/20/2013, 11/28/2015  . Influenza-Unspecified 11/27/2017  . Pneumococcal Conjugate-13 12/28/2014  . Pneumococcal Polysaccharide-23 01/08/2010  . Tdap 10/07/2011  . Zoster Recombinat (Shingrix) 05/20/2017, 07/20/2017   Screening Tests Health Maintenance  Topic Date Due  . INFLUENZA VACCINE  08/28/2018  . MAMMOGRAM  08/25/2019  . TETANUS/TDAP  10/06/2021  . COLONOSCOPY  05/12/2022  . DEXA SCAN  Completed  . Hepatitis C Screening  Completed  . PNA vac Low Risk Adult  Completed      Plan:      I have personally reviewed and noted the following in the patient's chart:   . Medical and social history . Use of alcohol, tobacco or illicit drugs  . Current medications and supplements . Functional ability and status . Nutritional status . Physical activity . Advanced directives . List of other physicians . Vitals . Screenings to include cognitive, depression, and falls . Referrals and appointments  In addition, I have reviewed and discussed with patient certain preventive protocols, quality metrics, and best practice recommendations. A written personalized care plan for preventive services as well as general preventive health recommendations were provided to patient.     Wanda PlumpJill A Mackinzee Roszak, RN  08/12/2018    Medical screening examination/treatment/procedure(s) were performed by non-physician practitioner and as supervising physician I was immediately available for consultation/collaboration. I agree with above. Oliver BarreJames John, MD

## 2018-08-12 NOTE — Progress Notes (Signed)
Subjective:    Patient ID: Michelle SkainsNancy K Hubbard, female    DOB: Jul 25, 1945, 73 y.o.   MRN: [id number]  HPI Here for yearly f/u;  Overall doing ok;  Pt denies Chest pain, worsening SOB, DOE, wheezing, orthopnea, PND, worsening LE edema, palpitations, dizziness or syncope.  Pt denies neurological change such as new headache, facial or extremity weakness.  Pt denies polydipsia, polyuria, or low sugar symptoms. Pt states overall good compliance with treatment and medications, good tolerability, and has been trying to follow appropriate diet.  Pt denies worsening depressive symptoms, suicidal ideation or panic. No fever, night sweats, wt loss, loss of appetite, or other constitutional symptoms.  Pt states good ability with ADL's, has low fall risk, home safety reviewed and adequate, no other significant changes in hearing or vision, and only occasionally active with exercise.  Now overdue for f/u LDCT for lung ca screening.  No new complaints Past Medical History:  Diagnosis Date  . 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 COPD 01/08/2010  . DEPRESSIVE DISORDER 12/23/2006  . FATIGUE 01/08/2010  . HYPERLIPIDEMIA 01/08/2010  . LOW BACK PAIN 12/23/2006  . OAB (overactive bladder) 10/07/2011  . OSTEOPOROSIS 01/08/2010  . 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 PONV (postoperative nausea and vomiting)   . SINUSITIS- ACUTE-NOS 07/09/2007  . Urticaria   . Vitamin D deficiency 04/30/2010   Past Surgical History:  Procedure Laterality Date  . ABDOMINAL HYSTERECTOMY    . APPENDECTOMY  1988  . BREAST BIOPSY  1986  . COLON RESECTION N/A 06/03/2013   Procedure: LAPAROSCOPY DIAGNOSTIC and OPEN  SIGMOID COLON RESECTION ;  Surgeon: Axel FillerArmando Ramirez, MD;  Location: [facility];  Service: General;  Laterality: N/A;  . COLON SURGERY  2000   colon obstruction  . larygenscopy    . OOPHORECTOMY  bilat 1988   secondary to cyst    reports that she has been smoking cigarettes. She has a 44.00 pack-year  smoking history. She has never used smokeless tobacco. She reports current alcohol use. She reports that she does not use drugs. family history includes Colon cancer in her brother and mother; Diabetes in her maternal aunt and maternal grandmother. Allergies  Allergen Reactions  . Codeine     REACTION: nausea  . Lortab [Hydrocodone-Acetaminophen] Hives  . Penicillins    Current Outpatient Medications on File Prior to Visit  Medication Sig Dispense Refill  . antiseptic oral rinse (BIOTENE) LIQD 15 mLs by Mouth Rinse route as needed for dry mouth.    Marland Kitchen. aspirin 325 MG tablet Take 325 mg by mouth daily.     . Flaxseed, Linseed, (EQL FLAX SEED OIL PO) Take 5 mLs by mouth.    . levocetirizine (XYZAL) 5 MG tablet Take 5 mg by mouth every evening.    Marland Kitchen. omeprazole (PRILOSEC) 10 MG capsule Take 10 mg by mouth as needed.    . Saline 0.65 % (Soln) SOLN Place 1 spray into the nose as needed.    . triamcinolone (NASACORT ALLERGY 24HR) 55 MCG/ACT AERO nasal inhaler Place 2 sprays into the nose daily.     No current facility-administered medications on file prior to visit.    Review of Systems  Constitutional: Negative for other unusual diaphoresis or sweats HENT: Negative for ear discharge or swelling Eyes: Negative for other worsening visual disturbances Respiratory: Negative for stridor or other swelling  Gastrointestinal: Negative for worsening distension or other blood Genitourinary: Negative for retention or other urinary change Musculoskeletal: Negative for  other MSK pain or swelling Skin: Negative for color change or other new lesions Neurological: Negative for worsening tremors and other numbness  Psychiatric/Behavioral: Negative for worsening agitation or other fatigue All other system neg per pt    Objective:   Physical Exam BP 134/74   Pulse 85   Temp 97.7 F (36.5 C) (Oral)   Ht 5\' 5"  (1.651 m)   Wt 139 lb (63 kg)   SpO2 97%   BMI 23.13 kg/m  VS noted,  Constitutional: Pt  appears in NAD HENT: Head: NCAT.  Right Ear: External ear normal.  Left Ear: External ear normal.  Eyes: . Pupils are equal, round, and reactive to light. Conjunctivae and EOM are normal Nose: without d/c or deformity Neck: Neck supple. Gross normal ROM Cardiovascular: Normal rate and regular rhythm.   Pulmonary/Chest: Effort normal and breath sounds without rales or wheezing.  Abd:  Soft, NT, ND, + BS, no organomegaly Neurological: Pt is alert. At baseline orientation, motor grossly intact Skin: Skin is warm. No rashes, other new lesions, no LE edema Psychiatric: Pt behavior is normal without agitation , mild nervous No other exam findings Lab Results  Component Value Date   WBC 6.8 07/20/2017   HGB 14.5 07/20/2017   HCT 43.2 07/20/2017   PLT 322.0 07/20/2017   GLUCOSE 96 08/12/2018   CHOL 189 08/12/2018   TRIG 113.0 08/12/2018   HDL 50.90 08/12/2018   LDLDIRECT 146.3 10/07/2011   LDLCALC 115 (H) 08/12/2018   ALT 12 07/20/2017   AST 13 07/20/2017   NA 138 08/12/2018   K 4.1 08/12/2018   CL 103 08/12/2018   CREATININE 0.69 08/12/2018   BUN 13 08/12/2018   CO2 29 08/12/2018   TSH 1.88 08/12/2018   INR 0.96 05/27/2013   HGBA1C 6.0 08/12/2018       Assessment & Plan:

## 2018-08-12 NOTE — Patient Instructions (Signed)
Continue doing brain stimulating activities (puzzles, reading, adult coloring books, staying active) to keep memory sharp.   Continue to eat heart healthy diet (full of fruits, vegetables, whole grains, lean protein, water--limit salt, fat, and sugar intake) and increase physical activity as tolerated.   Michelle Hubbard , Thank you for taking time to come for your Medicare Wellness Visit. I appreciate your ongoing commitment to your health goals. Please review the following plan we discussed and let me know if I can assist you in the future.   These are the goals we discussed: Goals    . Maintain current health status     Continue to be active exercise, eat healthy, enjoy life and family       This is a list of the screening recommended for you and due dates:  Health Maintenance  Topic Date Due  . Flu Shot  08/28/2018  . Mammogram  08/25/2019  . Tetanus Vaccine  10/06/2021  . Colon Cancer Screening  05/12/2022  . DEXA scan (bone density measurement)  Completed  .  Hepatitis C: One time screening is recommended by Center for Disease Control  (CDC) for  adults born from [birth year] through 1967.   Completed  . Pneumonia vaccines  Completed    Preventive Care 86 Years and Older, Female Preventive care refers to lifestyle choices and visits with your health care provider that can promote health and wellness. This includes:  A yearly physical exam. This is also called an annual well check.  Regular dental and eye exams.  Immunizations.  Screening for certain conditions.  Healthy lifestyle choices, such as diet and exercise. What can I expect for my preventive care visit? Physical exam Your health care provider will check:  Height and weight. These may be used to calculate body mass index (BMI), which is a measurement that tells if you are at a healthy weight.  Heart rate and blood pressure.  Your skin for abnormal spots. Counseling Your health care provider may ask you questions  about:  Alcohol, tobacco, and drug use.  Emotional well-being.  Home and relationship well-being.  Sexual activity.  Eating habits.  History of falls.  Memory and ability to understand (cognition).  Work and work Statistician.  Pregnancy and menstrual history. What immunizations do I need?  Influenza (flu) vaccine  This is recommended every year. Tetanus, diphtheria, and pertussis (Tdap) vaccine  You may need a Td booster every 10 years. Varicella (chickenpox) vaccine  You may need this vaccine if you have not already been vaccinated. Zoster (shingles) vaccine  You may need this after age 33. Pneumococcal conjugate (PCV13) vaccine  One dose is recommended after age 76. Pneumococcal polysaccharide (PPSV23) vaccine  One dose is recommended after age 30. Measles, mumps, and rubella (MMR) vaccine  You may need at least one dose of MMR if you were born in 1957 or later. You may also need a second dose. Meningococcal conjugate (MenACWY) vaccine  You may need this if you have certain conditions. Hepatitis A vaccine  You may need this if you have certain conditions or if you travel or work in places where you may be exposed to hepatitis A. Hepatitis B vaccine  You may need this if you have certain conditions or if you travel or work in places where you may be exposed to hepatitis B. Haemophilus influenzae type b (Hib) vaccine  You may need this if you have certain conditions. You may receive vaccines as individual doses or as more than  one vaccine together in one shot (combination vaccines). Talk with your health care provider about the risks and benefits of combination vaccines. What tests do I need? Blood tests  Lipid and cholesterol levels. These may be checked every 5 years, or more frequently depending on your overall health.  Hepatitis C test.  Hepatitis B test. Screening  Lung cancer screening. You may have this screening every year starting at age 30 if  you have a 30-pack-year history of smoking and currently smoke or have quit within the past 15 years.  Colorectal cancer screening. All adults should have this screening starting at age 58 and continuing until age 69. Your health care provider may recommend screening at age 22 if you are at increased risk. You will have tests every 1-10 years, depending on your results and the type of screening test.  Diabetes screening. This is done by checking your blood sugar (glucose) after you have not eaten for a while (fasting). You may have this done every 1-3 years.  Mammogram. This may be done every 1-2 years. Talk with your health care provider about how often you should have regular mammograms.  BRCA-related cancer screening. This may be done if you have a family history of breast, ovarian, tubal, or peritoneal cancers. Other tests  Sexually transmitted disease (STD) testing.  Bone density scan. This is done to screen for osteoporosis. You may have this done starting at age 67. Follow these instructions at home: Eating and drinking  Eat a diet that includes fresh fruits and vegetables, whole grains, lean protein, and low-fat dairy products. Limit your intake of foods with high amounts of sugar, saturated fats, and salt.  Take vitamin and mineral supplements as recommended by your health care provider.  Do not drink alcohol if your health care provider tells you not to drink.  If you drink alcohol: ? Limit how much you have to 0-1 drink a day. ? Be aware of how much alcohol is in your drink. In the [place] one drink equals one 12 oz bottle of beer (355 mL), one 5 oz glass of wine (148 mL), or one 1 oz glass of hard liquor (44 mL). Lifestyle  Take daily care of your teeth and gums.  Stay active. Exercise for at least 30 minutes on 5 or more days each week.  Do not use any products that contain nicotine or tobacco, such as cigarettes, e-cigarettes, and chewing tobacco. If you need help  quitting, ask your health care provider.  If you are sexually active, practice safe sex. Use a condom or other form of protection in order to prevent STIs (sexually transmitted infections).  Talk with your health care provider about taking a low-dose aspirin or statin. What's next?  Go to your health care provider once a year for a well check visit.  Ask your health care provider how often you should have your eyes and teeth checked.  Stay up to date on all vaccines. This information is not intended to replace advice given to you by your health care provider. Make sure you discuss any questions you have with your health care provider. Document Released: 02/09/2015 Document Revised: 01/07/2018 Document Reviewed: 01/07/2018 Elsevier Patient Education  2020 [employer].

## 2018-08-12 NOTE — Patient Instructions (Signed)
Please continue all other medications as before, and refills have been done if requested.  Please have the pharmacy call with any other refills you may need.  Please continue your efforts at being more active, low cholesterol diet, and weight control.  You are otherwise up to date with prevention measures today.  Please keep your appointments with your specialists as you may have planned  You will be contacted regarding the referral for: pulmonary  Please go to the LAB in the Basement (turn left off the elevator) for the tests to be done today  You will be contacted by phone if any changes need to be made immediately.  Otherwise, you will receive a letter about your results with an explanation, but please check with MyChart first.  Please remember to sign up for MyChart if you have not done so, as this will be important to you in the future with finding out test results, communicating by private email, and scheduling acute appointments online when needed.  Please return in 1 year for your yearly visit, or sooner if needed

## 2018-08-13 ENCOUNTER — Encounter: Payer: Self-pay | Admitting: Internal Medicine

## 2018-08-14 ENCOUNTER — Encounter: Payer: Self-pay | Admitting: Internal Medicine

## 2018-08-14 NOTE — Assessment & Plan Note (Signed)
For vit d .level f/u 

## 2018-08-14 NOTE — Assessment & Plan Note (Signed)
Urge to quit

## 2018-08-14 NOTE — Assessment & Plan Note (Signed)
stable overall by history and exam, recent data reviewed with pt, and pt to continue medical treatment as before,  to f/u any worsening symptoms or concerns, for a1c 

## 2018-08-14 NOTE — Assessment & Plan Note (Signed)
[facility] for referral pulm for f/u LDCT

## 2018-08-14 NOTE — Assessment & Plan Note (Signed)
stable overall by history and exam, recent data reviewed with pt, and pt to continue medical treatment as before,  to f/u any worsening symptoms or concerns  

## 2019-02-10 ENCOUNTER — Other Ambulatory Visit: Payer: Self-pay | Admitting: *Deleted

## 2019-02-10 DIAGNOSIS — Z87891 Personal history of nicotine dependence: Secondary | ICD-10-CM

## 2019-02-10 DIAGNOSIS — F17210 Nicotine dependence, cigarettes, uncomplicated: Secondary | ICD-10-CM

## 2019-03-07 ENCOUNTER — Other Ambulatory Visit: Payer: Self-pay

## 2019-03-07 ENCOUNTER — Ambulatory Visit (INDEPENDENT_AMBULATORY_CARE_PROVIDER_SITE_OTHER): Payer: Medicare Other | Admitting: Acute Care

## 2019-03-07 ENCOUNTER — Encounter: Payer: Self-pay | Admitting: Acute Care

## 2019-03-07 ENCOUNTER — Ambulatory Visit
Admission: RE | Admit: 2019-03-07 | Discharge: 2019-03-07 | Disposition: A | Discharge status: Home / Self Care | Payer: Medicare Other | Source: Ambulatory Visit | Attending: Acute Care | Admitting: Acute Care

## 2019-03-07 ENCOUNTER — Encounter: Payer: Self-pay | Admitting: Internal Medicine

## 2019-03-07 VITALS — BP 140/68 | HR 91 | Ht 65.0 in | Wt 140.6 lb

## 2019-03-07 DIAGNOSIS — F17210 Nicotine dependence, cigarettes, uncomplicated: Secondary | ICD-10-CM | POA: Diagnosis not present

## 2019-03-07 DIAGNOSIS — R0602 Shortness of breath: Secondary | ICD-10-CM

## 2019-03-07 DIAGNOSIS — Z87891 Personal history of nicotine dependence: Secondary | ICD-10-CM

## 2019-03-07 MED ORDER — TEMAZEPAM 15 MG PO CAPS: ORAL_CAPSULE | ORAL | 5 refills | Status: DC

## 2019-03-07 NOTE — Addendum Note (Signed)
Addended by: Cydney Ok on: 03/07/2019 03:06 PM   Modules accepted: Orders

## 2019-03-07 NOTE — Progress Notes (Addendum)
Shared Decision Making Visit Lung Cancer Screening Program [phone])   Eligibility:  Age 74 y.o.  Pack Years Smoking History Calculation 44 pack year smoking history (# packs/per year x # years smoked)  Recent History of coughing up blood  no  Unexplained weight loss? no ( >Than 15 pounds within the last 6 months )  Prior History Lung / other cancer no (Diagnosis within the last 5 years already requiring surveillance chest CT Scans).  Smoking Status Current Smoker  Former Smokers: Years since quit: NA  Quit Date: NA  Visit Components:  Discussion included one or more decision making aids. yes  Discussion included risk/benefits of screening. yes  Discussion included potential follow up diagnostic testing for abnormal scans. yes  Discussion included meaning and risk of over diagnosis. yes  Discussion included meaning and risk of False Positives. yes  Discussion included meaning of total radiation exposure. yes  Counseling Included:  Importance of adherence to annual lung cancer LDCT screening. yes  Impact of comorbidities on ability to participate in the program. yes  Ability and willingness to under diagnostic treatment. yes  Smoking Cessation Counseling:  Current Smokers:   Discussed importance of smoking cessation. yes  Information about tobacco cessation classes and interventions provided to patient. yes  Patient provided with "ticket" for LDCT Scan. yes  Symptomatic Patient. no  Counseling  Diagnosis Code: Tobacco Use Z72.0  Asymptomatic Patient yes  Counseling (Intermediate counseling: > three minutes counseling) P2951  Former Smokers:   Discussed the importance of maintaining cigarette abstinence. yes  Diagnosis Code: Personal History of Nicotine Dependence. O84.166  Information about tobacco cessation classes and interventions provided to patient. Yes  Patient provided with "ticket" for LDCT Scan. yes  Written Order for Lung Cancer  Screening with LDCT placed in Epic. Yes (CT Chest Lung Cancer Screening Low Dose W/O CM) [id number] Z12.2-Screening of respiratory organs Z87.891-Personal history of nicotine dependence  BP 140/68 (BP Location: Left Arm, Cuff Size: Normal)   Pulse 91   Ht 5\' 5"  (1.651 m)   Wt 140 lb 9.6 oz (63.8 kg)   SpO2 91%   BMI 23.40 kg/m    I have spent 25 minutes of face to face time with Ms. Dario  discussing the risks and benefits of lung cancer screening. We viewed a power point together that explained in detail the above noted topics. We paused at intervals to allow for questions to be asked and answered to ensure understanding.We discussed that the single most powerful action that she can take to decrease her risk of developing lung cancer is to quit smoking. We discussed whether or not she is ready to commit to setting a quit date. We discussed options for tools to aid in quitting smoking including nicotine replacement therapy, non-nicotine medications, support groups, Quit Smart classes, and behavior modification. We discussed that often times setting smaller, more achievable goals, such as eliminating 1 cigarette a day for a week and then 2 cigarettes a day for a week can be helpful in slowly decreasing the number of cigarettes smoked. This allows for a sense of accomplishment as well as providing a clinical benefit. I gave her the " Be Stronger Than Your Excuses" card with contact information for community resources, classes, free nicotine replacement therapy, and access to mobile apps, text messaging, and on-line smoking cessation help. I have also given her my card and contact information in the event she needs to contact me. We discussed the time and location of the scan, and  that either Doroteo Glassman RN or I will call with the results within 24-48 hours of receiving them. I have offered her  a copy of the power point we viewed  as a resource in the event they need reinforcement of the concepts we  discussed today in the office. The patient verbalized understanding of all of  the above and had no further questions upon leaving the office. They have my contact information in the event they have any further questions.  I spent 4 minutes counseling on smoking cessation and the health risks of continued tobacco abuse.  I explained to the patient that there has been a high incidence of coronary artery disease noted on these exams. I explained that this is a non-gated exam therefore degree or severity cannot be determined. This patient is not on statin therapy. I have asked the patient to follow-up with their PCP regarding any incidental finding of coronary artery disease and management with diet or medication as their PCP  feels is clinically indicated. The patient verbalized understanding of the above and had no further questions upon completion of the visit.  We will refer patient for a Pulmonary Consult for tobacco abuse with suspected COPD at her request. She would like to be evaluated for inhaler use and diagnostics ( PFT).  She is contemplating working on quitting smoking.  She was counseled today.  Magdalen Spatz, NP 03/07/2019 3:54 PM

## 2019-03-07 NOTE — Patient Instructions (Signed)
Thank you for participating in the [facility]. It was our pleasure to meet you today. We will call you with the results of your scan within the next few days. Your scan will be assigned a Lung RADS category score by the physicians reading the scans.  This Lung RADS score determines follow up scanning.  See below for description of categories, and follow up screening recommendations. We will be in touch to schedule your follow up screening annually or based on recommendations of our providers. We will fax a copy of your scan results to your Primary Care Physician, or the physician who referred you to the program, to ensure they have the results. Please call the office if you have any questions or concerns regarding your scanning experience or results.  Our office number is [phone]. Please speak with Denise Phelps, RN. She is our Lung Cancer Screening RN. If she is unavailable when you call, please have the office staff send her a message. She will return your call at her earliest convenience. Remember, if your scan is normal, we will scan you annually as long as you continue to meet the criteria for the program. (Age 55-77, Current smoker or smoker who has quit within the last 15 years). If you are a smoker, remember, quitting is the single most powerful action that you can take to decrease your risk of lung cancer and other pulmonary, breathing related problems. We know quitting is hard, and we are here to help.  Please let us know if there is anything we can do to help you meet your goal of quitting. If you are a former smoker, congratulations. We are proud of you! Remain smoke free! Remember you can refer friends or family members through the number above.  We will screen them to make sure they meet criteria for the program. Thank you for helping us take better care of you by participating in Lung Screening.  Lung RADS Categories:  Lung RADS 1: no nodules  or definitely non-concerning nodules.  Recommendation is for a repeat annual scan in 12 months.  Lung RADS 2:  nodules that are non-concerning in appearance and behavior with a very low likelihood of becoming an active cancer. Recommendation is for a repeat annual scan in 12 months.  Lung RADS 3: nodules that are probably non-concerning , includes nodules with a low likelihood of becoming an active cancer.  Recommendation is for a 6-month repeat screening scan. Often noted after an upper respiratory illness. We will be in touch to make sure you have no questions, and to schedule your 6-month scan.  Lung RADS 4 A: nodules with concerning findings, recommendation is most often for a follow up scan in 3 months or additional testing based on our provider's assessment of the scan. We will be in touch to make sure you have no questions and to schedule the recommended 3 month follow up scan.  Lung RADS 4 B:  indicates findings that are concerning. We will be in touch with you to schedule additional diagnostic testing based on our provider's  assessment of the scan.   

## 2019-03-10 ENCOUNTER — Other Ambulatory Visit: Payer: Self-pay | Admitting: *Deleted

## 2019-03-10 DIAGNOSIS — F17210 Nicotine dependence, cigarettes, uncomplicated: Secondary | ICD-10-CM

## 2019-03-10 DIAGNOSIS — Z87891 Personal history of nicotine dependence: Secondary | ICD-10-CM

## 2019-03-10 NOTE — Progress Notes (Signed)
Please call patient and let them  know their  low dose Ct was read as a Lung RADS 2: nodules that are benign in appearance and behavior with a very low likelihood of becoming a clinically active cancer due to size or lack of growth. Recommendation per radiology is for a repeat LDCT in 12 months. .Please let them  know we will order and schedule their  annual screening scan for 02/2020. Please let them  know there was notation of CAD on their  scan.  Please remind the patient  that this is a non-gated exam therefore degree or severity of disease  cannot be determined. Please have them  follow up with their PCP regarding potential risk factor modification, dietary therapy or pharmacologic therapy if clinically indicated. Pt.  is not  currently on statin therapy. Please place order for annual  screening scan for  02/2020 and fax results to PCP. Thanks so much.  Denise, please have them follow up re the CAD findings with their PCP.

## 2019-03-27 ENCOUNTER — Ambulatory Visit: Payer: Medicare Other | Attending: Internal Medicine

## 2019-03-27 DIAGNOSIS — Z23 Encounter for immunization: Secondary | ICD-10-CM | POA: Insufficient documentation

## 2019-03-27 NOTE — Progress Notes (Signed)
   Covid-19 Vaccination Clinic  Name:  Michelle Hubbard    MRN: [id number] DOB: [birth year]  03/27/2019  [name] was observed post Covid-19 immunization for 15 minutes without incidence. She was provided with Vaccine Information Sheet and instruction to access the V-Safe system.   [name] was instructed to call 911 with any severe reactions post vaccine: Marland Kitchen Difficulty breathing  . Swelling of your face and throat  . A fast heartbeat  . A bad rash all over your body  . Dizziness and weakness    Immunizations Administered    Name Date Dose VIS Date Route   Pfizer COVID-19 Vaccine 03/27/2019  9:54 AM 0.3 mL 01/07/2019 Intramuscular   Manufacturer: [employer], [employer]   Lot: [id number]   NDC: [address]-6

## 2019-04-20 ENCOUNTER — Ambulatory Visit: Payer: Medicare Other | Attending: Internal Medicine

## 2019-04-20 DIAGNOSIS — Z23 Encounter for immunization: Secondary | ICD-10-CM

## 2019-04-20 NOTE — Progress Notes (Signed)
   Covid-19 Vaccination Clinic  Name:  Michelle Hubbard    MRN: [id number] DOB: 08-17-1945  04/20/2019  [name] was observed post Covid-19 immunization for 15 minutes without incident. She was provided with Vaccine Information Sheet and instruction to access the V-Safe system.   [name] was instructed to call 911 with any severe reactions post vaccine: Marland Kitchen Difficulty breathing  . Swelling of face and throat  . A fast heartbeat  . A bad rash all over body  . Dizziness and weakness   Immunizations Administered    Name Date Dose VIS Date Route   Pfizer COVID-19 Vaccine 04/20/2019 12:01 PM 0.3 mL 01/07/2019 Intramuscular   Manufacturer: [employer], [employer]   Lot: [id number]   NDC: [address]-2

## 2019-08-16 ENCOUNTER — Other Ambulatory Visit: Payer: Self-pay

## 2019-08-16 ENCOUNTER — Encounter: Payer: Self-pay | Admitting: Internal Medicine

## 2019-08-16 ENCOUNTER — Ambulatory Visit (INDEPENDENT_AMBULATORY_CARE_PROVIDER_SITE_OTHER): Payer: Medicare Other | Admitting: Internal Medicine

## 2019-08-16 ENCOUNTER — Ambulatory Visit (INDEPENDENT_AMBULATORY_CARE_PROVIDER_SITE_OTHER): Payer: Medicare Other

## 2019-08-16 VITALS — BP 138/80 | HR 73 | Temp 98.4°F | Resp 16 | Ht 65.0 in | Wt 139.8 lb

## 2019-08-16 VITALS — BP 138/80 | HR 73 | Temp 98.4°F | Ht 65.0 in | Wt 139.8 lb

## 2019-08-16 DIAGNOSIS — Z0000 Encounter for general adult medical examination without abnormal findings: Secondary | ICD-10-CM

## 2019-08-16 DIAGNOSIS — R739 Hyperglycemia, unspecified: Secondary | ICD-10-CM

## 2019-08-16 DIAGNOSIS — J449 Chronic obstructive pulmonary disease, unspecified: Secondary | ICD-10-CM | POA: Diagnosis not present

## 2019-08-16 DIAGNOSIS — E785 Hyperlipidemia, unspecified: Secondary | ICD-10-CM

## 2019-08-16 DIAGNOSIS — F17200 Nicotine dependence, unspecified, uncomplicated: Secondary | ICD-10-CM

## 2019-08-16 DIAGNOSIS — E559 Vitamin D deficiency, unspecified: Secondary | ICD-10-CM

## 2019-08-16 DIAGNOSIS — J438 Other emphysema: Secondary | ICD-10-CM

## 2019-08-16 DIAGNOSIS — E538 Deficiency of other specified B group vitamins: Secondary | ICD-10-CM

## 2019-08-16 DIAGNOSIS — I2510 Atherosclerotic heart disease of native coronary artery without angina pectoris: Secondary | ICD-10-CM

## 2019-08-16 MED ORDER — ROSUVASTATIN CALCIUM 10 MG PO TABS: 10.0000 mg | ORAL_TABLET | Freq: Every day | ORAL | 3 refills | Status: DC

## 2019-08-16 NOTE — Assessment & Plan Note (Signed)
Cont oral replacement 

## 2019-08-16 NOTE — Assessment & Plan Note (Signed)
stable overall by history and exam, recent data reviewed with pt, and pt to continue medical treatment as before,  to f/u any worsening symptoms or concerns  

## 2019-08-16 NOTE — Progress Notes (Signed)
Subjective:    Patient ID: Michelle Hubbard, female    DOB: 11-02-1945, 74 y.o.   MRN: [id number]  HPI  Here to f/u; overall doing ok,  Pt denies chest pain, increasing sob or doe, wheezing, orthopnea, PND, increased LE swelling, palpitations, dizziness or syncope.  Pt denies new neurological symptoms such as new headache, or facial or extremity weakness or numbness.  Pt denies polydipsia, polyuria, or low sugar episode.  Pt states overall good compliance with meds, mostly trying to follow appropriate diet, with wt overall stable,  but little exercise however.  Still smoking, not ready to quit.  Tolerating vit d.   Past Medical History:  Diagnosis Date  . 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 COPD 01/08/2010  . DEPRESSIVE DISORDER 12/23/2006  . FATIGUE 01/08/2010  . HYPERLIPIDEMIA 01/08/2010  . LOW BACK PAIN 12/23/2006  . OAB (overactive bladder) 10/07/2011  . OSTEOPOROSIS 01/08/2010  . 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 PONV (postoperative nausea and vomiting)   . SINUSITIS- ACUTE-NOS 07/09/2007  . Urticaria   . Vitamin D deficiency 04/30/2010   Past Surgical History:  Procedure Laterality Date  . ABDOMINAL HYSTERECTOMY    . APPENDECTOMY  1988  . BREAST BIOPSY  1986  . COLON RESECTION N/A 06/03/2013   Procedure: LAPAROSCOPY DIAGNOSTIC and OPEN  SIGMOID COLON RESECTION ;  Surgeon: Axel Filler, MD;  Location: [facility];  Service: General;  Laterality: N/A;  . COLON SURGERY  2000   colon obstruction  . larygenscopy    . OOPHORECTOMY  bilat 1988   secondary to cyst    reports that she has been smoking cigarettes. She has a 44.00 pack-year smoking history. She has never used smokeless tobacco. She reports current alcohol use. She reports that she does not use drugs. family history includes Colon cancer in her brother and mother; Diabetes in her maternal aunt and maternal grandmother. Allergies  Allergen Reactions  . Codeine     REACTION: nausea  . Lortab  [Hydrocodone-Acetaminophen] Hives  . Penicillins    Current Outpatient Medications on File Prior to Visit  Medication Sig Dispense Refill  . antiseptic oral rinse (BIOTENE) LIQD 15 mLs by Mouth Rinse route as needed for dry mouth.    Marland Kitchen aspirin 325 MG tablet Take 325 mg by mouth daily.     . betamethasone valerate (VALISONE) 0.1 % cream Apply topically 2 (two) times daily. 30 g 2  . levocetirizine (XYZAL) 5 MG tablet Take 5 mg by mouth every evening.    Marland Kitchen omeprazole (PRILOSEC) 10 MG capsule Take 10 mg by mouth as needed.    . polyethylene glycol (MIRALAX / GLYCOLAX) 17 g packet Take 17 g by mouth daily.    . Probiotic Product (ALIGN PO) Take 1 tablet by mouth.    . Saline 0.65 % (Soln) SOLN Place 1 spray into the nose as needed.    . temazepam (RESTORIL) 15 MG capsule TAKE 2 CAPSULES BY MOUTH AT BEDTIME AS NEEDED 60 capsule 5  . triamcinolone (NASACORT ALLERGY 24HR) 55 MCG/ACT AERO nasal inhaler Place 2 sprays into the nose daily.    . citalopram (CELEXA) 10 MG tablet Take 1 tablet (10 mg total) by mouth daily. 90 tablet 3   No current facility-administered medications on file prior to visit.   Review of Systems All otherwise neg per pt     Objective:   Physical Exam BP 138/80 (BP Location: Left Arm, Patient Position: Sitting, Cuff Size: Large)  Pulse 73   Temp 98.4 F (36.9 C) (Oral)   Ht 5\' 5"  (1.651 m)   Wt 139 lb 12.8 oz (63.4 kg)   SpO2 92%   BMI 23.26 kg/m  VS noted,  Constitutional: Pt appears in NAD HENT: Head: NCAT.  Right Ear: External ear normal.  Left Ear: External ear normal.  Eyes: . Pupils are equal, round, and reactive to light. Conjunctivae and EOM are normal Nose: without d/c or deformity Neck: Neck supple. Gross normal ROM Cardiovascular: Normal rate and regular rhythm.   Pulmonary/Chest: Effort normal and breath sounds without rales or wheezing.  Abd:  Soft, NT, ND, + BS, no organomegaly Neurological: Pt is alert. At baseline orientation, motor grossly  intact Skin: Skin is warm. No rashes, other new lesions, no LE edema Psychiatric: Pt behavior is normal without agitation  All otherwise neg per pt Lab Results  Component Value Date   WBC 6.8 07/20/2017   HGB 14.5 07/20/2017   HCT 43.2 07/20/2017   PLT 322.0 07/20/2017   GLUCOSE 96 08/12/2018   CHOL 189 08/12/2018   TRIG 113.0 08/12/2018   HDL 50.90 08/12/2018   LDLDIRECT 146.3 10/07/2011   LDLCALC 115 (H) 08/12/2018   ALT 12 07/20/2017   AST 13 07/20/2017   NA 138 08/12/2018   K 4.1 08/12/2018   CL 103 08/12/2018   CREATININE 0.69 08/12/2018   BUN 13 08/12/2018   CO2 29 08/12/2018   TSH 1.88 08/12/2018   INR 0.96 05/27/2013   HGBA1C 6.0 08/12/2018      Assessment & Plan:

## 2019-08-16 NOTE — Patient Instructions (Signed)
We have discussed the Cardiac CT Score test to measure the calcification level (if any) in your heart arteries.  This test has been ordered in our Computer System, so please call [facility] directly, as they prefer this, at [phone] to be scheduled.  Please take OTC Vitamin D3 at 2000 units per day, indefinitely.  Please take all new medication as prescribed - the generic crestor 10 mg per day  Please quit smoking  Please continue all other medications as before, and refills have been done if requested.  Please have the pharmacy call with any other refills you may need.  Please continue your efforts at being more active, low cholesterol diet, and weight control.  You are otherwise up to date with prevention measures today.  Please keep your appointments with your specialists as you may have planned  Please go to the LAB at the blood drawing area for the tests to be done  You will be contacted by phone if any changes need to be made immediately.  Otherwise, you will receive a letter about your results with an explanation, but please check with MyChart first.  Please remember to sign up for MyChart if you have not done so, as this will be important to you in the future with finding out test results, communicating by private email, and scheduling acute appointments online when needed.  Please make an Appointment to return for your 1 year visit, or sooner if needed

## 2019-08-16 NOTE — Progress Notes (Signed)
Subjective:   Michelle Hubbard is a 74 y.o. female who presents for Medicare Annual (Subsequent) preventive examination.  Review of Systems    No ROS. Medicare Wellness Visit Cardiac Risk Factors include: advanced age (>99men, >60 women);dyslipidemia;hypertension;smoking/ tobacco exposure     Objective:    Today's Vitals   08/16/19 1336  BP: 138/80  Pulse: 73  Resp: 16  Temp: 98.4 F (36.9 C)  SpO2: 92%  Weight: 139 lb 12.8 oz (63.4 kg)  Height: 5\' 5"  (1.651 m)  PainSc: 0-No pain   Body mass index is 23.26 kg/m.  Advanced Directives 08/16/2019 08/12/2018 07/02/2017 07/21/2016 06/19/2016 11/28/2014 06/03/2013  Does Patient Have a Medical Advance Directive? Yes Yes Yes No Yes Yes Patient has advance directive, copy not in chart  Type of Advance Directive Healthcare Power of [place];Living will Healthcare Power of [place];Living will Healthcare Power of [place];Living will - Healthcare Power of [place];Living will - Living will  Does patient want to make changes to medical advance directive? No - Patient declined - - - - - -  Copy of Healthcare Power of Attorney in Chart? No - copy requested No - copy requested No - copy requested - No - copy requested - Copy requested from family  Pre-existing out of facility DNR order (yellow form or pink MOST form) - - - - - - No    Current Medications (verified) Outpatient Encounter Medications as of 08/16/2019  Medication Sig  . antiseptic oral rinse (BIOTENE) LIQD 15 mLs by Mouth Rinse route as needed for dry mouth.  08/18/2019 aspirin 325 MG tablet Take 325 mg by mouth daily.   . betamethasone valerate (VALISONE) 0.1 % cream Apply topically 2 (two) times daily.  Marland Kitchen levocetirizine (XYZAL) 5 MG tablet Take 5 mg by mouth every evening.  Marland Kitchen omeprazole (PRILOSEC) 10 MG capsule Take 10 mg by mouth as needed.  . polyethylene glycol (MIRALAX / GLYCOLAX) 17 g packet Take 17 g by mouth daily.  . Probiotic Product (ALIGN PO) Take 1 tablet by mouth.  . Saline  0.65 % (Soln) SOLN Place 1 spray into the nose as needed.  . temazepam (RESTORIL) 15 MG capsule TAKE 2 CAPSULES BY MOUTH AT BEDTIME AS NEEDED  . triamcinolone (NASACORT ALLERGY 24HR) 55 MCG/ACT AERO nasal inhaler Place 2 sprays into the nose daily.  . citalopram (CELEXA) 10 MG tablet Take 1 tablet (10 mg total) by mouth daily.  . [DISCONTINUED] Flaxseed, Linseed, (EQL FLAX SEED OIL PO) Take 5 mLs by mouth.  . [DISCONTINUED] Vitamin D, Ergocalciferol, (DRISDOL) 1.25 MG (50000 UT) CAPS capsule Take 1 capsule (50,000 Units total) by mouth every 7 (seven) days.   No facility-administered encounter medications on file as of 08/16/2019.    Allergies (verified) Codeine, Lortab [hydrocodone-acetaminophen], and Penicillins   History: Past Medical History:  Diagnosis Date  . 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 COPD 01/08/2010  . DEPRESSIVE DISORDER 12/23/2006  . FATIGUE 01/08/2010  . HYPERLIPIDEMIA 01/08/2010  . LOW BACK PAIN 12/23/2006  . OAB (overactive bladder) 10/07/2011  . OSTEOPOROSIS 01/08/2010  . 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 PONV (postoperative nausea and vomiting)   . SINUSITIS- ACUTE-NOS 07/09/2007  . Urticaria   . Vitamin D deficiency 04/30/2010   Past Surgical History:  Procedure Laterality Date  . ABDOMINAL HYSTERECTOMY    . APPENDECTOMY  1988  . BREAST BIOPSY  1986  . COLON RESECTION N/A 06/03/2013   Procedure: LAPAROSCOPY DIAGNOSTIC and OPEN  SIGMOID COLON  RESECTION ;  Surgeon: Axel FillerArmando Ramirez, MD;  Location: [facility];  Service: General;  Laterality: N/A;  . COLON SURGERY  2000   colon obstruction  . larygenscopy    . OOPHORECTOMY  bilat 1988   secondary to cyst   Family History  Problem Relation Age of Onset  . Colon cancer Mother   . Colon cancer Brother   . Diabetes Maternal Aunt   . Diabetes Maternal Grandmother   . Allergic rhinitis Neg Hx   . Angioedema Neg Hx   . Asthma Neg Hx   . Eczema Neg Hx   . Immunodeficiency Neg Hx    . Urticaria Neg Hx    Social History   Socioeconomic History  . Marital status: Widowed    Spouse name: Not on file  . Number of children: 3  . Years of education: Not on file  . Highest education level: Not on file  Occupational History  . Occupation: Retired    Associate Professormployer: Designer, television[employer]  Tobacco Use  . Smoking status: Current Every Day Smoker    Packs/day: 1.00    Years: 44.00    Pack years: 44.00    Types: Cigarettes  . Smokeless tobacco: Never Used  . Tobacco comment: smoking more due to move/ trying to quit  Vaping Use  . Vaping Use: Never used  Substance and Sexual Activity  . Alcohol use: Yes    Alcohol/week: 0.0 standard drinks    Comment: vodka nightly  . Drug use: No  . Sexual activity: Not Currently  Other Topics Concern  . Not on file  Social History Narrative  . Not on file   Social Determinants of Health   Financial Resource Strain: Low Risk   . Difficulty of Paying Living Expenses: Not hard at all  Food Insecurity: No Food Insecurity  . Worried About Programme researcher, broadcasting/film/videounning Out of Food in the Last Year: Never true  . Ran Out of Food in the Last Year: Never true  Transportation Needs: No Transportation Needs  . Lack of Transportation (Medical): No  . Lack of Transportation (Non-Medical): No  Physical Activity: Sufficiently Active  . Days of Exercise per Week: 5 days  . Minutes of Exercise per Session: 30 min  Stress: No Stress Concern Present  . Feeling of Stress : Not at all  Social Connections: Moderately Integrated  . Frequency of Communication with Friends and Family: More than three times a week  . Frequency of Social Gatherings with Friends and Family: More than three times a week  . Attends Religious Services: More than 4 times per year  . Active Member of Clubs or Organizations: Yes  . Attends BankerClub or Organization Meetings: More than 4 times per year  . Marital Status: Widowed    Tobacco Counseling Ready to quit: No Counseling given: No  Comment: smoking more due to move/ trying to quit   Clinical Intake:  Pre-visit preparation completed: Yes  Pain : No/denies pain Pain Score: 0-No pain     BMI - recorded: 23.26 Nutritional Status: BMI of 19-24  Normal Nutritional Risks: None Diabetes: No  How often do you need to have someone help you when you read instructions, pamphlets, or other written materials from your doctor or pharmacy?: 1 - Never What is the last grade level you completed in school?: HSG  Diabetic?no  Interpreter Needed?: No  Information entered by :: Alesa Echevarria N. Ayeza Therriault, LPN   Activities of Daily Living In your present state of health, do you  have any difficulty performing the following activities: 08/16/2019  Hearing? Y  Comment wears hearing aids  Vision? N  Difficulty concentrating or making decisions? N  Walking or climbing stairs? N  Dressing or bathing? N  Doing errands, shopping? N  Preparing Food and eating ? N  Using the Toilet? N  In the past six months, have you accidently leaked urine? Y  Comment wears mini pad for protection  Do you have problems with loss of bowel control? N  Managing your Medications? N  Managing your Finances? N  Housekeeping or managing your Housekeeping? N  Some recent data might be hidden    Patient Care Team: Corwin Levins, MD as PCP - General  Indicate any recent Medical Services you may have received from other than [facility] providers in the past year (date may be approximate).     Assessment:   This is a routine wellness examination for Wise.  Hearing/Vision screen No exam data present  Dietary issues and exercise activities discussed: Current Exercise Habits: Home exercise routine, Type of exercise: walking;Other - see comments (yard work), Time (Minutes): 30, Frequency (Times/Week): 5, Weekly Exercise (Minutes/Week): 150, Intensity: Moderate, Exercise limited by: None identified  Goals    . Maintain current health status     Continue to  be active exercise, eat healthy, enjoy life and family      Depression Screen PHQ 2/9 Scores 08/16/2019 08/12/2018 07/20/2017 07/02/2017 06/19/2016 11/28/2014 12/20/2013  PHQ - 2 Score 0 0 0 1 0 0 0  PHQ- 9 Score - 1 0 5 3 - -    Fall Risk Fall Risk  08/16/2019 08/12/2018 07/20/2017 07/02/2017 06/19/2016  Falls in the past year? 0 0 No No No  Comment - - - - -  Number falls in past yr: 0 0 - - -  Injury with Fall? 0 - - - -  Risk for fall due to : No Fall Risks - - - -  Follow up Falls evaluation completed - - - -    Any stairs in or around the home? Yes  If so, are there any without handrails? No  Home free of loose throw rugs in walkways, pet beds, electrical cords, etc? Yes  Adequate lighting in your home to reduce risk of falls? Yes   ASSISTIVE DEVICES UTILIZED TO PREVENT FALLS:  Life alert? No  Use of a cane, walker or w/c? No  Grab bars in the bathroom? Yes  Shower chair or bench in shower? Yes  Elevated toilet seat or a handicapped toilet? No   TIMED UP AND GO:  Was the test performed? No .  Length of time to ambulate 10 feet: 0 sec.   Gait steady and fast without use of assistive device  Cognitive Function: MMSE - Mini Mental State Exam 08/12/2018 11/28/2014  Not completed: - (No Data)  Orientation to time 5 -  Orientation to Place 5 -  Registration 3 -  Attention/ Calculation 5 -  Recall 3 -  Language- name 2 objects 2 -  Language- repeat 1 -  Language- follow 3 step command 3 -  Language- read & follow direction 1 -  Write a sentence 1 -  Copy design 1 -  Total score 30 -     6CIT Screen 08/16/2019  What Year? 0 points  What month? 0 points  What time? 0 points  Count back from 20 0 points  Months in reverse 0 points  Repeat phrase 0 points  Total Score 0    Immunizations Immunization History  Administered Date(s) Administered  . Influenza Split 10/07/2011  . Influenza Whole 01/08/2010  . Influenza, High Dose Seasonal PF 11/28/2014  .  Influenza,inj,Quad PF,6+ Mos 12/20/2013, 11/28/2015  . Influenza-Unspecified 11/27/2017  . PFIZER SARS-COV-2 Vaccination 03/27/2019, 04/20/2019  . Pneumococcal Conjugate-13 12/28/2014  . Pneumococcal Polysaccharide-23 01/08/2010  . Tdap 10/07/2011  . Zoster Recombinat (Shingrix) 05/20/2017, 07/20/2017    TDAP status: Up to date Flu Vaccine status: Up to date Pneumococcal vaccine status: Up to date Covid-19 vaccine status: Completed vaccines  Qualifies for Shingles Vaccine? Yes   Zostavax completed No   Shingrix Completed?: Yes  Screening Tests Health Maintenance  Topic Date Due  . MAMMOGRAM  08/25/2019  . INFLUENZA VACCINE  08/28/2019  . TETANUS/TDAP  10/06/2021  . COLONOSCOPY  05/12/2022  . DEXA SCAN  Completed  . COVID-19 Vaccine  Completed  . Hepatitis C Screening  Completed  . PNA vac Low Risk Adult  Completed    Health Maintenance  There are no preventive care reminders to display for this patient.  Colorectal cancer screening: Completed 05/11/2017. Repeat every 5 years Mammogram status: Completed 08/24/2017. Repeat every year Bone Density Status: Last completed 03/06/2010; patient refused medication and the scan  Lung Cancer Screening: (Low Dose CT Chest recommended if Age 67-80 years, 30 pack-year currently smoking OR have quit w/in 15years.) does qualify.   Lung Cancer Screening Referral: Yes; referral was previously made; waiting on call.  Additional Screening:  Hepatitis C Screening: does qualify; Completed yes  Vision Screening: Recommended annual ophthalmology exams for early detection of glaucoma and other disorders of the eye. Is the patient up to date with their annual eye exam?  Yes  Who is the provider or what is the name of the office in which the patient attends annual eye exams? [facility] in [place], [place] If pt is not established with a provider, would they like to be referred to a provider to establish care? No .   Dental Screening:  Recommended annual dental exams for proper oral hygiene  Community Resource Referral / Chronic Care Management: CRR required this visit?  No   CCM required this visit?  No      Plan:     I have personally reviewed and noted the following in the patient's chart:   . Medical and social history . Use of alcohol, tobacco or illicit drugs  . Current medications and supplements . Functional ability and status . Nutritional status . Physical activity . Advanced directives . List of other physicians . Hospitalizations, surgeries, and ER visits in previous 12 months . Vitals . Screenings to include cognitive, depression, and falls . Referrals and appointments  In addition, I have reviewed and discussed with patient certain preventive protocols, quality metrics, and best practice recommendations. A written personalized care plan for preventive services as well as general preventive health recommendations were provided to patient.     Mickeal Needy, LPN   1/60/1093   Nurse Notes: n/a

## 2019-08-16 NOTE — Assessment & Plan Note (Addendum)
Also for card ct scoring  I spent 31 minutes in preparing to see the patient by review of recent labs, imaging and procedures, obtaining and reviewing separately obtained history, communicating with the patient and family or caregiver, ordering medications, tests or procedures, and documenting clinical information in the EHR including the differential Dx, treatment, and any further evaluation and other management of cad copd, asthma, hyperglycemia,, hld, smoker, vit d def

## 2019-08-16 NOTE — Assessment & Plan Note (Signed)
Urged to quit 

## 2019-08-16 NOTE — Patient Instructions (Addendum)
Michelle Hubbard , Thank you for taking time to come for your Medicare Wellness Visit. I appreciate your ongoing commitment to your health goals. Please review the following plan we discussed and let me know if I can assist you in the future.   Screening recommendations/referrals: Colonoscopy: 05/11/2017; due every 5 years Mammogram: 08/24/2017; due every 2 years Bone Density: refused Recommended yearly ophthalmology/optometry visit for glaucoma screening and checkup Recommended yearly dental visit for hygiene and checkup  Vaccinations: Influenza vaccine: up to date Pneumococcal vaccine: up to date Tdap vaccine: up to date Shingles vaccine: up to date   Covid-19: up to date  Advanced directives: Please bring a copy of your health care power of attorney and living will to the office at your convenience.  Conditions/risks identified: Please continue to do your personal lifestyle choices by: daily care of teeth and gums, regular physical activity (goal should be 5 days a week for 30 minutes), eat a healthy diet, avoid tobacco and drug use, limiting any alcohol intake, taking a low-dose aspirin (if not allergic or have been advised by your provider otherwise) and taking vitamins and minerals as recommended by your provider. Continue doing brain stimulating activities (puzzles, reading, adult coloring books, staying active) to keep memory sharp. Continue to eat heart healthy diet (full of fruits, vegetables, whole grains, lean protein, water--limit salt, fat, and sugar intake) and increase physical activity as tolerated.  Next appointment: Please schedule your next Medicare Wellness Visit with your Nurse Health Advisor in 1 year.   Preventive Care 76 Years and Older, Female Preventive care refers to lifestyle choices and visits with your health care provider that can promote health and wellness. What does preventive care include?  A yearly physical exam. This is also called an annual well  check.  Dental exams once or twice a year.  Routine eye exams. Ask your health care provider how often you should have your eyes checked.  Personal lifestyle choices, including:  Daily care of your teeth and gums.  Regular physical activity.  Eating a healthy diet.  Avoiding tobacco and drug use.  Limiting alcohol use.  Practicing safe sex.  Taking low-dose aspirin every day.  Taking vitamin and mineral supplements as recommended by your health care provider. What happens during an annual well check? The services and screenings done by your health care provider during your annual well check will depend on your age, overall health, lifestyle risk factors, and family history of disease. Counseling  Your health care provider may ask you questions about your:  Alcohol use.  Tobacco use.  Drug use.  Emotional well-being.  Home and relationship well-being.  Sexual activity.  Eating habits.  History of falls.  Memory and ability to understand (cognition).  Work and work Astronomer.  Reproductive health. Screening  You may have the following tests or measurements:  Height, weight, and BMI.  Blood pressure.  Lipid and cholesterol levels. These may be checked every 5 years, or more frequently if you are over 65 years old.  Skin check.  Lung cancer screening. You may have this screening every year starting at age 33 if you have a 30-pack-year history of smoking and currently smoke or have quit within the past 15 years.  Fecal occult blood test (FOBT) of the stool. You may have this test every year starting at age 76.  Flexible sigmoidoscopy or colonoscopy. You may have a sigmoidoscopy every 5 years or a colonoscopy every 10 years starting at age 76.  Hepatitis C  blood test.  Hepatitis B blood test.  Sexually transmitted disease (STD) testing.  Diabetes screening. This is done by checking your blood sugar (glucose) after you have not eaten for a while  (fasting). You may have this done every 1-3 years.  Bone density scan. This is done to screen for osteoporosis. You may have this done starting at age 29.  Mammogram. This may be done every 1-2 years. Talk to your health care provider about how often you should have regular mammograms. Talk with your health care provider about your test results, treatment options, and if necessary, the need for more tests. Vaccines  Your health care provider may recommend certain vaccines, such as:  Influenza vaccine. This is recommended every year.  Tetanus, diphtheria, and acellular pertussis (Tdap, Td) vaccine. You may need a Td booster every 10 years.  Zoster vaccine. You may need this after age 76.  Pneumococcal 13-valent conjugate (PCV13) vaccine. One dose is recommended after age 24.  Pneumococcal polysaccharide (PPSV23) vaccine. One dose is recommended after age 33. Talk to your health care provider about which screenings and vaccines you need and how often you need them. This information is not intended to replace advice given to you by your health care provider. Make sure you discuss any questions you have with your health care provider. Document Released: 02/09/2015 Document Revised: 10/03/2015 Document Reviewed: 11/14/2014 Elsevier Interactive Patient Education  2017 [place] Prevention in the Home Falls can cause injuries. They can happen to people of all ages. There are many things you can do to make your home safe and to help prevent falls. What can I do on the outside of my home?  Regularly fix the edges of walkways and driveways and fix any cracks.  Remove anything that might make you trip as you walk through a door, such as a raised step or threshold.  Trim any bushes or trees on the path to your home.  Use bright outdoor lighting.  Clear any walking paths of anything that might make someone trip, such as rocks or tools.  Regularly check to see if handrails are loose  or broken. Make sure that both sides of any steps have handrails.  Any raised decks and porches should have guardrails on the edges.  Have any leaves, snow, or ice cleared regularly.  Use sand or salt on walking paths during winter.  Clean up any spills in your garage right away. This includes oil or grease spills. What can I do in the bathroom?  Use night lights.  Install grab bars by the toilet and in the tub and shower. Do not use towel bars as grab bars.  Use non-skid mats or decals in the tub or shower.  If you need to sit down in the shower, use a plastic, non-slip stool.  Keep the floor dry. Clean up any water that spills on the floor as soon as it happens.  Remove soap buildup in the tub or shower regularly.  Attach bath mats securely with double-sided non-slip rug tape.  Do not have throw rugs and other things on the floor that can make you trip. What can I do in the bedroom?  Use night lights.  Make sure that you have a light by your bed that is easy to reach.  Do not use any sheets or blankets that are too big for your bed. They should not hang down onto the floor.  Have a firm chair that has side arms. You  can use this for support while you get dressed.  Do not have throw rugs and other things on the floor that can make you trip. What can I do in the kitchen?  Clean up any spills right away.  Avoid walking on wet floors.  Keep items that you use a lot in easy-to-reach places.  If you need to reach something above you, use a strong step stool that has a grab bar.  Keep electrical cords out of the way.  Do not use floor polish or wax that makes floors slippery. If you must use wax, use non-skid floor wax.  Do not have throw rugs and other things on the floor that can make you trip. What can I do with my stairs?  Do not leave any items on the stairs.  Make sure that there are handrails on both sides of the stairs and use them. Fix handrails that are  broken or loose. Make sure that handrails are as long as the stairways.  Check any carpeting to make sure that it is firmly attached to the stairs. Fix any carpet that is loose or worn.  Avoid having throw rugs at the top or bottom of the stairs. If you do have throw rugs, attach them to the floor with carpet tape.  Make sure that you have a light switch at the top of the stairs and the bottom of the stairs. If you do not have them, ask someone to add them for you. What else can I do to help prevent falls?  Wear shoes that:  Do not have high heels.  Have rubber bottoms.  Are comfortable and fit you well.  Are closed at the toe. Do not wear sandals.  If you use a stepladder:  Make sure that it is fully opened. Do not climb a closed stepladder.  Make sure that both sides of the stepladder are locked into place.  Ask someone to hold it for you, if possible.  Clearly mark and make sure that you can see:  Any grab bars or handrails.  First and last steps.  Where the edge of each step is.  Use tools that help you move around (mobility aids) if they are needed. These include:  Canes.  Walkers.  Scooters.  Crutches.  Turn on the lights when you go into a dark area. Replace any light bulbs as soon as they burn out.  Set up your furniture so you have a clear path. Avoid moving your furniture around.  If any of your floors are uneven, fix them.  If there are any pets around you, be aware of where they are.  Review your medicines with your doctor. Some medicines can make you feel dizzy. This can increase your chance of falling. Ask your doctor what other things that you can do to help prevent falls. This information is not intended to replace advice given to you by your health care provider. Make sure you discuss any questions you have with your health care provider. Document Released: 11/09/2008 Document Revised: 06/21/2015 Document Reviewed: 02/17/2014 Elsevier  Interactive Patient Education  2017 [employer].

## 2019-08-17 LAB — CBC WITH DIFFERENTIAL/PLATELET
Absolute Monocytes: 586 cells/uL (ref 200–950)
Basophils Absolute: 32 cells/uL (ref 0–200)
Basophils Relative: 0.5 %
Eosinophils Absolute: 139 cells/uL (ref 15–500)
Eosinophils Relative: 2.2 %
HCT: 43.8 % (ref 35.0–45.0)
Hemoglobin: 14.6 g/dL (ref 11.7–15.5)
Lymphs Abs: 1474 cells/uL (ref 850–3900)
MCH: 30.8 pg (ref 27.0–33.0)
MCHC: 33.3 g/dL (ref 32.0–36.0)
MCV: 92.4 fL (ref 80.0–100.0)
MPV: 10.3 fL (ref 7.5–12.5)
Monocytes Relative: 9.3 %
Neutro Abs: 4070 cells/uL (ref 1500–7800)
Neutrophils Relative %: 64.6 %
Platelets: 364 10*3/uL (ref 140–400)
RBC: 4.74 10*6/uL (ref 3.80–5.10)
RDW: 13 % (ref 11.0–15.0)
Total Lymphocyte: 23.4 %
WBC: 6.3 10*3/uL (ref 3.8–10.8)

## 2019-08-17 LAB — COMPLETE METABOLIC PANEL WITH GFR
AG Ratio: 1.5 (calc) (ref 1.0–2.5)
ALT: 12 U/L (ref 6–29)
AST: 17 U/L (ref 10–35)
Albumin: 4.4 g/dL (ref 3.6–5.1)
Alkaline phosphatase (APISO): 65 U/L (ref 37–153)
BUN: 10 mg/dL (ref 7–25)
CO2: 27 mmol/L (ref 20–32)
Calcium: 9.6 mg/dL (ref 8.6–10.4)
Chloride: 101 mmol/L (ref 98–110)
Creat: 0.71 mg/dL (ref 0.60–0.93)
GFR, Est African American: 97 mL/min/{1.73_m2} (ref >=60)
GFR, Est Non African American: 83 mL/min/{1.73_m2} (ref >=60)
Globulin: 3 g/dL (calc) (ref 1.9–3.7)
Glucose, Bld: 95 mg/dL (ref 65–99)
Potassium: 4.2 mmol/L (ref 3.5–5.3)
Sodium: 137 mmol/L (ref 135–146)
Total Bilirubin: 0.3 mg/dL (ref 0.2–1.2)
Total Protein: 7.4 g/dL (ref 6.1–8.1)

## 2019-08-17 LAB — LIPID PANEL
Cholesterol: 205 mg/dL — ABNORMAL HIGH (ref <200)
HDL: 66 mg/dL (ref >=50)
LDL Cholesterol (Calc): 119 mg/dL (calc) — ABNORMAL HIGH
Non-HDL Cholesterol (Calc): 139 mg/dL (calc) — ABNORMAL HIGH (ref <130)
Total CHOL/HDL Ratio: 3.1 (calc) (ref <5.0)
Triglycerides: 92 mg/dL (ref <150)

## 2019-08-17 LAB — URINALYSIS, ROUTINE W REFLEX MICROSCOPIC
Bilirubin Urine: NEGATIVE
Glucose, UA: NEGATIVE
Hgb urine dipstick: NEGATIVE
Ketones, ur: NEGATIVE
Leukocytes,Ua: NEGATIVE
Nitrite: NEGATIVE
Protein, ur: NEGATIVE
Specific Gravity, Urine: 1.009 (ref 1.001–1.03)
pH: >=8.5 — AB (ref 5.0–8.0)

## 2019-08-17 LAB — HEMOGLOBIN A1C
Hgb A1c MFr Bld: 5.5 % of total Hgb (ref <5.7)
Mean Plasma Glucose: 111 (calc)
eAG (mmol/L): 6.2 (calc)

## 2019-08-17 LAB — VITAMIN D 25 HYDROXY (VIT D DEFICIENCY, FRACTURES): Vit D, 25-Hydroxy: 28 ng/mL — ABNORMAL LOW (ref 30–100)

## 2019-08-17 LAB — TSH: TSH: 1.53 mIU/L (ref 0.40–4.50)

## 2019-08-17 LAB — VITAMIN B12: Vitamin B-12: 412 pg/mL (ref 200–1100)

## 2019-08-18 ENCOUNTER — Other Ambulatory Visit: Payer: Self-pay | Admitting: Internal Medicine

## 2019-08-18 ENCOUNTER — Encounter: Payer: Self-pay | Admitting: Internal Medicine

## 2019-08-18 MED ORDER — ROSUVASTATIN CALCIUM 20 MG PO TABS: 20.0000 mg | ORAL_TABLET | Freq: Every day | ORAL | 3 refills | Status: DC

## 2019-08-29 ENCOUNTER — Other Ambulatory Visit: Payer: Self-pay

## 2019-08-29 ENCOUNTER — Encounter: Payer: Self-pay | Admitting: Internal Medicine

## 2019-08-29 ENCOUNTER — Other Ambulatory Visit: Payer: Self-pay | Admitting: Internal Medicine

## 2019-08-29 ENCOUNTER — Ambulatory Visit (INDEPENDENT_AMBULATORY_CARE_PROVIDER_SITE_OTHER)
Admission: RE | Admit: 2019-08-29 | Discharge: 2019-08-29 | Disposition: A | Discharge status: Home / Self Care | Payer: Self-pay | Source: Ambulatory Visit | Attending: Internal Medicine | Admitting: Internal Medicine

## 2019-08-29 DIAGNOSIS — I2510 Atherosclerotic heart disease of native coronary artery without angina pectoris: Secondary | ICD-10-CM

## 2019-09-07 ENCOUNTER — Other Ambulatory Visit: Payer: Self-pay | Admitting: Internal Medicine

## 2019-09-07 NOTE — Telephone Encounter (Signed)
Done erx 

## 2019-10-06 ENCOUNTER — Encounter: Payer: Self-pay | Admitting: Cardiovascular Disease

## 2019-10-06 ENCOUNTER — Other Ambulatory Visit: Payer: Self-pay

## 2019-10-06 ENCOUNTER — Ambulatory Visit (INDEPENDENT_AMBULATORY_CARE_PROVIDER_SITE_OTHER): Payer: Medicare Other | Admitting: Cardiovascular Disease

## 2019-10-06 VITALS — BP 134/75 | HR 80 | Ht 65.0 in | Wt 142.0 lb

## 2019-10-06 DIAGNOSIS — I700 Atherosclerosis of aorta: Secondary | ICD-10-CM

## 2019-10-06 DIAGNOSIS — E7800 Pure hypercholesterolemia, unspecified: Secondary | ICD-10-CM

## 2019-10-06 DIAGNOSIS — I208 Other forms of angina pectoris: Secondary | ICD-10-CM | POA: Diagnosis not present

## 2019-10-06 DIAGNOSIS — R931 Abnormal findings on diagnostic imaging of heart and coronary circulation: Secondary | ICD-10-CM | POA: Diagnosis not present

## 2019-10-06 DIAGNOSIS — I2089 Other forms of angina pectoris: Secondary | ICD-10-CM

## 2019-10-06 MED ORDER — METOPROLOL SUCCINATE ER 25 MG PO TB24: 25.0000 mg | ORAL_TABLET | Freq: Every day | ORAL | 1 refills | Status: DC

## 2019-10-06 MED ORDER — METOPROLOL TARTRATE 50 MG PO TABS: ORAL_TABLET | ORAL | 0 refills | Status: DC

## 2019-10-06 MED ORDER — NITROGLYCERIN 0.4 MG SL SUBL: 0.4000 mg | SUBLINGUAL_TABLET | SUBLINGUAL | 3 refills | Status: DC | PRN

## 2019-10-06 NOTE — Patient Instructions (Signed)
Medication Instructions:  START Metoprolol Succinate 25 mg once daily Take Nitroglycerin as needed for chest pain: Place one tablet under the tongue as needed every 5 minutes for chest pain. If you have to use three tablets with no relief, please call 911 or go to your closet Emergency Department.   *If you need a refill on your cardiac medications before your next appointment, please call your pharmacy*   Lab Work: None ordered If you have labs (blood work) drawn today and your tests are completely normal, you will receive your results only by: Marland Kitchen MyChart Message (if you have MyChart) OR . A paper copy in the mail If you have any lab test that is abnormal or we need to change your treatment, we will call you to review the results.   Testing/Procedures: Your cardiac CT will be scheduled at one of the below locations:   [facility] [address] [place], [place] [address] [phone]  [place] [address] [place], [place] [address] [phone]  If scheduled at [facility], please arrive at the [facility] main entrance of [facility] 30 minutes prior to test start time. Proceed to the [facility] Radiology Department (first floor) to check-in and test prep.  If scheduled at [facility], please arrive 15 mins early for check-in and test prep.  Please follow these instructions carefully (unless otherwise directed):  On the Night Before the Test: . Be sure to Drink plenty of water. . Do not consume any caffeinated/decaffeinated beverages or chocolate 12 hours prior to your test. . Do not take any antihistamines 12 hours prior to your test.  On the Day of the Test: . Drink plenty of water. Do not drink any water within one hour of the test. . Do not eat any food 4 hours prior to the test. . You may take your regular medications prior to the test.  . Take metoprolol  (Lopressor) two hours prior to test. . FEMALES- please wear underwire-free bra if available      After the Test: . Drink plenty of water. . After receiving IV contrast, you may experience a mild flushed feeling. This is normal. . On occasion, you may experience a mild rash up to 24 hours after the test. This is not dangerous. If this occurs, you can take Benadryl 25 mg and increase your fluid intake. . If you experience trouble breathing, this can be serious. If it is severe call 911 IMMEDIATELY. If it is mild, please call our office. . If you take any of these medications: Glipizide/Metformin, Avandament, Glucavance, please do not take 48 hours after completing test unless otherwise instructed.   Once we have confirmed authorization from your insurance company, we will call you to set up a date and time for your test. Based on how quickly your insurance processes prior authorizations requests, please allow up to 4 weeks to be contacted for scheduling your Cardiac CT appointment. Be advised that routine Cardiac CT appointments could be scheduled as many as 8 weeks after your provider has ordered it.  For non-scheduling related questions, please contact the cardiac imaging nurse navigator should you have any questions/concerns: Marchia Bond, Cardiac Imaging Nurse Navigator Burley Saver, Interim Cardiac Imaging Nurse [place] and V[facility] Services Direct Office Dial: [phone]   For scheduling needs, including cancellations and rescheduling, please call Vivien Rota at [phone], option 3.      Follow-Up:  At [facility], you and your health needs are our priority.  As part of our continuing mission to provide you with exceptional heart care, we have created designated Provider Care Teams.  These Care Teams include your primary Cardiologist (physician) and Advanced Practice Providers (APPs -  Physician Assistants and Nurse Practitioners) who all work together to provide you with  the care you need, when you need it.  We recommend signing up for the patient portal called "MyChart".  Sign up information is provided on this After Visit Summary.  MyChart is used to connect with patients for Virtual Visits (Telemedicine).  Patients are able to view lab/test results, encounter notes, upcoming appointments, etc.  Non-urgent messages can be sent to your provider as well.   To learn more about what you can do with MyChart, go to [email or web address].    Your next appointment:   2 month(s)  The format for your next appointment:   In Person  Provider:   You may see Sanda Klein, MD or one of the following Advanced Practice Providers on your designated Care Team:    Almyra Deforest, PA-C  Fabian Sharp, PA-C or   Roby Lofts, [place]

## 2019-10-06 NOTE — Progress Notes (Signed)
Cardiology Office Note:    Date:  10/06/2019   ID:  Michelle Hubbard, DOB [birth year], MRN [id number]  PCP:  Michelle Borg, MD  [place] Cardiologist:  No primary care provider on file. [facility] Electrophysiologist:  None   Referring MD: Michelle Borg, MD   No chief complaint on file. Michelle Hubbard is a 74 y.o. female who is being seen today for the evaluation of abnormal coronary calcium score at the request of Michelle Borg, MD.   History of Present Illness:    Michelle Hubbard is a 74 y.o. female with a hx of smoking and hypercholesterolemia, referred after a coronary calcium CT which showed a Agatston score of 100 (91st percentile).  In turn that study was ordered due to the finding of high vascular calcium burden a CT of the chest performed for lung cancer screening.  On initial reports, Michelle Hubbard denies any cardiovascular symptoms but a little little closer questioning it appears that she has been experiencing exertional angina pectoris for months if not years.  She describes this as a sensation of tightness in the middle of her chest and motions with both palms flattened on the middle of her sternum, pushing down.  It does not radiate.  It is brought on by greater than usual physical activity such as carrying weights while she is walking or climbing the stairs at a faster than usual pace.  It is accompanied by shortness of breath.  It is promptly relieved by rest, usually in one or 2 minutes, never longer than 10 minutes.  She really does not remember how long this has been going on, but "it has been a while".  She denies any change in she is learned to pace herself during activities to avoid recurring.  She has been attributing the discomfort and shortness of breath to COPD.  She is a lifelong smoker, currently trying to cut back.  She does not have diabetes mellitus although she has had some periods of hyperglycemia.  Not have hypertension.  She has never had a  stroke or TIA.  She has been a widow for approximately 8 years and takes care of all of her house and yard work.  She denies edema, orthopnea, PND, palpitations, dizziness or syncope, focal neurological events, major weight changes or intermittent claudication.  She is taking aspirin 325 mg daily (I am not sure if this was prescribed after the calcium score report) and rosuvastatin 20 mg daily, which was just prescribed.  Baseline LDL before medication was 119.  Past Medical History:  Diagnosis Date  . 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 COPD 01/08/2010  . DEPRESSIVE DISORDER 12/23/2006  . FATIGUE 01/08/2010  . HYPERLIPIDEMIA 01/08/2010  . LOW BACK PAIN 12/23/2006  . OAB (overactive bladder) 10/07/2011  . OSTEOPOROSIS 01/08/2010  . 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 PONV (postoperative nausea and vomiting)   . SINUSITIS- ACUTE-NOS 07/09/2007  . Urticaria   . Vitamin D deficiency 04/30/2010    Past Surgical History:  Procedure Laterality Date  . ABDOMINAL HYSTERECTOMY    . APPENDECTOMY  1988  . BREAST BIOPSY  1986  . COLON RESECTION N/A 06/03/2013   Procedure: LAPAROSCOPY DIAGNOSTIC and OPEN  SIGMOID COLON RESECTION ;  Surgeon: Ralene Ok, MD;  Location: [facility] ORS;  Service: General;  Laterality: N/A;  . COLON SURGERY  2000   colon obstruction  . larygenscopy    . [place]  secondary to cyst    Current Medications: Current Meds  Medication Sig  . antiseptic oral rinse (BIOTENE) LIQD 15 mLs by Mouth Rinse route as needed for dry mouth.  Marland Kitchen aspirin 325 MG tablet Take 325 mg by mouth daily.   . betamethasone valerate (VALISONE) 0.1 % cream Apply topically 2 (two) times daily.  . Cholecalciferol (VITAMIN D-3 PO) Take by mouth.  . citalopram (CELEXA) 10 MG tablet Take 1 tablet by mouth once daily  . levocetirizine (XYZAL) 5 MG tablet Take 5 mg by mouth every evening.  Marland Kitchen omeprazole (PRILOSEC) 10 MG capsule Take 10 mg by mouth as needed.   . polyethylene glycol (MIRALAX / GLYCOLAX) 17 g packet Take 17 g by mouth daily.  . Probiotic Product (ALIGN PO) Take 1 tablet by mouth.  . rosuvastatin (CRESTOR) 20 MG tablet Take 1 tablet (20 mg total) by mouth daily.  . Saline 0.65 % (Soln) SOLN Place 1 spray into the nose as needed.  . temazepam (RESTORIL) 15 MG capsule TAKE 2 CAPSULES BY MOUTH AT BEDTIME AS NEEDED  . triamcinolone (NASACORT ALLERGY 24HR) 55 MCG/ACT AERO nasal inhaler Place 2 sprays into the nose daily.     Allergies:   Codeine, Lortab [hydrocodone-acetaminophen], and Penicillins   Social History   Socioeconomic History  . Marital status: Widowed    Spouse name: Not on file  . Number of children: 3  . Years of education: Not on file  . Highest education level: Not on file  Occupational History  . Occupation: Retired    Fish farm manager: [employer]  Tobacco Use  . Smoking status: Current Every Day Smoker    Packs/day: 1.00    Years: 44.00    Pack years: 44.00    Types: Cigarettes  . Smokeless tobacco: Never Used  . Tobacco comment: smoking more due to move/ trying to quit  Vaping Use  . Vaping Use: Never used  Substance and Sexual Activity  . Alcohol use: Yes    Alcohol/week: 0.0 standard drinks    Comment: vodka nightly  . Drug use: No  . Sexual activity: Not Currently  Other Topics Concern  . Not on file  Social History Narrative  . Not on file   Social Determinants of Health   Financial Resource Strain: Low Risk   . Difficulty of Paying Living Expenses: Not hard at all  Food Insecurity: No Food Insecurity  . Worried About Charity fundraiser in the Last Year: Never true  . Ran Out of Food in the Last Year: Never true  Transportation Needs: No Transportation Needs  . Lack of Transportation (Medical): No  . Lack of Transportation (Non-Medical): No  Physical Activity: Sufficiently Active  . Days of Exercise per Week: 5 days  . Minutes of Exercise per Session: 30 min  Stress: No Stress  Concern Present  . Feeling of Stress : Not at all  Social Connections: Moderately Integrated  . Frequency of Communication with Friends and Family: More than three times a week  . Frequency of Social Gatherings with Friends and Family: More than three times a week  . Attends Religious Services: More than 4 times per year  . Active Member of Clubs or Organizations: Yes  . Attends Archivist Meetings: More than 4 times per year  . Marital Status: Widowed     Family History: The patient's family history includes Colon cancer in her brother and mother; Diabetes in her maternal aunt and maternal grandmother. There is  no history of Allergic rhinitis, Angioedema, Asthma, Eczema, Immunodeficiency, or Urticaria.  ROS:   Please see the history of present illness.     All other systems reviewed and are negative.  EKGs/Labs/Other Studies Reviewed:    The following studies were reviewed today: Coronary calcium score, labs, notes from Dr. Jenny Reichmann  EKG:  EKG is  ordered today.  The ekg ordered today demonstrates normal sinus rhythm, normal tracing  Recent Labs: 08/16/2019: ALT 12; BUN 10; Creat 0.71; Hemoglobin 14.6; Platelets 364; Potassium 4.2; Sodium 137; TSH 1.53  Recent Lipid Panel    Component Value Date/Time   CHOL 205 (H) 08/16/2019 1518   TRIG 92 08/16/2019 1518   HDL 66 08/16/2019 1518   CHOLHDL 3.1 08/16/2019 1518   VLDL 22.6 08/12/2018 1457   LDLCALC 119 (H) 08/16/2019 1518   LDLDIRECT 146.3 10/07/2011 0919    Physical Exam:    VS:  BP 134/75   Pulse 80   Ht '5\' 5"'  (1.651 m)   Wt 142 lb (64.4 kg)   SpO2 95%   BMI 23.63 kg/m     Wt Readings from Last 3 Encounters:  10/06/19 142 lb (64.4 kg)  08/16/19 139 lb 12.8 oz (63.4 kg)  08/16/19 139 lb 12.8 oz (63.4 kg)     GEN: Lean, well nourished, well developed in no acute distress HEENT: Normal NECK: No JVD; No carotid bruits LYMPHATICS: No lymphadenopathy CARDIAC: RRR, no murmurs, rubs, gallops RESPIRATORY:   Clear to auscultation without rales, wheezing or rhonchi  ABDOMEN: Soft, non-tender, non-distended MUSCULOSKELETAL:  No edema; No deformity  SKIN: Warm and dry NEUROLOGIC:  Alert and oriented x 3 PSYCHIATRIC:  Normal affect   ASSESSMENT:    1. Hypercholesterolemia   2. Stable angina pectoris ([place])   3. Abnormal findings on diagnostic imaging of heart and coronary circulation    4. Atherosclerosis of aorta ([facility])    PLAN:    In order of problems listed above:  1. Stable angina pectoris: She gives a typical description of exertional angina which has been in a stable pattern for at least several months.  Together with her elevated calcium score this makes it highly likely that she has a high-grade coronary stenosis, but it does not predict an imminent major cardiac event.  We will start treatment with metoprolol 25 mg once daily and schedule her for a coronary CT angiogram.  Continue aspirin, although we can decrease this to 81 mg daily.  Target LDL cholesterol less than 70, regardless of what the findings are on the coronary CTA.  Option for future invasive angiography if high risk anatomy (left main or proximal LAD stenosis, multivessel stenoses) is identified or if her symptoms cannot be improved with medical therapy.  Spent a long time discussing the difference between stable angina and unstable angina syndromes, the latter would require her to request immediate medical attention. 2. HLP: It is likely that the current prescribed dose of rosuvastatin will be more than enough to achieve target LDL less than 70.  She has just started taking it. 3. Smoking: 44-pack-year history of smoking.  Smoking cessation will have the critical impact of reducing future major cardiac events and worsening coronary disease and lung problems.  Strongly encouraged to continue her efforts to function. 4. Atherosclerosis of the aorta: Incidentally noticed on imaging studies, normal caliber aorta and the segments  visualized.   Medication Adjustments/Labs and Tests Ordered: Current medicines are reviewed at length with the patient today.  Concerns regarding medicines are  outlined above.  Orders Placed This Encounter  Procedures  . CT CORONARY MORPH W/CTA COR W/SCORE W/CA W/CM &/OR WO/CM  . CT CORONARY FRACTIONAL FLOW RESERVE DATA PREP  . CT CORONARY FRACTIONAL FLOW RESERVE FLUID ANALYSIS  . Basic metabolic panel   Meds ordered this encounter  Medications  . metoprolol succinate (TOPROL-XL) 25 MG 24 hr tablet    Sig: Take 1 tablet (25 mg total) by mouth daily. Take with or immediately following a meal.    Dispense:  90 tablet    Refill:  1  . metoprolol tartrate (LOPRESSOR) 50 MG tablet    Sig: Take two hours before the test    Dispense:  1 tablet    Refill:  0  . nitroGLYCERIN (NITROSTAT) 0.4 MG SL tablet    Sig: Place 1 tablet (0.4 mg total) under the tongue every 5 (five) minutes as needed for chest pain.    Dispense:  25 tablet    Refill:  3    Patient Instructions  Medication Instructions:  START Metoprolol Succinate 25 mg once daily Take Nitroglycerin as needed for chest pain: Place one tablet under the tongue as needed every 5 minutes for chest pain. If you have to use three tablets with no relief, please call 911 or go to your closet Emergency Department.   *If you need a refill on your cardiac medications before your next appointment, please call your pharmacy*   Lab Work: None ordered If you have labs (blood work) drawn today and your tests are completely normal, you will receive your results only by: Marland Kitchen MyChart Message (if you have MyChart) OR . A paper copy in the mail If you have any lab test that is abnormal or we need to change your treatment, we will call you to review the results.   Testing/Procedures: Your cardiac CT will be scheduled at one of the below locations:   [facility] [address] [place], [place] [address] [phone]  [facility] [address] [place], [place] [address] [phone]  If scheduled at [facility], please arrive at the [facility] main entrance of [facility] 30 minutes prior to test start time. Proceed to the [facility] Radiology Department (first floor) to check-in and test prep.  If scheduled at [facility], please arrive 15 mins early for check-in and test prep.  Please follow these instructions carefully (unless otherwise directed):  On the Night Before the Test: . Be sure to Drink plenty of water. . Do not consume any caffeinated/decaffeinated beverages or chocolate 12 hours prior to your test. . Do not take any antihistamines 12 hours prior to your test.  On the Day of the Test: . Drink plenty of water. Do not drink any water within one hour of the test. . Do not eat any food 4 hours prior to the test. . You may take your regular medications prior to the test.  . Take metoprolol (Lopressor) two hours prior to test. . FEMALES- please wear underwire-free bra if available      After the Test: . Drink plenty of water. . After receiving IV contrast, you may experience a mild flushed feeling. This is normal. . On occasion, you may experience a mild rash up to 24 hours after the test. This is not dangerous. If this occurs, you can take Benadryl 25 mg and increase your fluid intake. . If you experience trouble breathing,  this can be serious. If it is severe call 911 IMMEDIATELY. If it is mild, please call our office. . If you take any of these medications: Glipizide/Metformin, Avandament, Glucavance, please do not take 48 hours after completing test unless otherwise instructed.   Once we have confirmed authorization from your insurance company, we will call you to set up a date and time for your test. Based on how quickly your insurance processes prior authorizations requests,  please allow up to 4 weeks to be contacted for scheduling your Cardiac CT appointment. Be advised that routine Cardiac CT appointments could be scheduled as many as 8 weeks after your provider has ordered it.  For non-scheduling related questions, please contact the cardiac imaging nurse navigator should you have any questions/concerns: Marchia Bond, Cardiac Imaging Nurse Navigator Burley Saver, Interim Cardiac Imaging Nurse [place] and Vascular Services Direct Office Dial: [phone]   For scheduling needs, including cancellations and rescheduling, please call Vivien Rota at [phone], option 3.      Follow-Up: At [facility], you and your health needs are our priority.  As part of our continuing mission to provide you with exceptional heart care, we have created designated Provider Care Teams.  These Care Teams include your primary Cardiologist (physician) and Advanced Practice Providers (APPs -  Physician Assistants and Nurse Practitioners) who all work together to provide you with the care you need, when you need it.  We recommend signing up for the patient portal called "MyChart".  Sign up information is provided on this After Visit Summary.  MyChart is used to connect with patients for Virtual Visits (Telemedicine).  Patients are able to view lab/test results, encounter notes, upcoming appointments, etc.  Non-urgent messages can be sent to your provider as well.   To learn more about what you can do with MyChart, go to [email or web address].    Your next appointment:   2 month(s)  The format for your next appointment:   In Person  Provider:   You may see Sanda Klein, MD or one of the following Advanced Practice Providers on your designated Care Team:    Almyra Deforest, PA-C  Fabian Sharp, [place] or   Roby Lofts, PA-C        Signed, Sanda Klein, MD  10/06/2019 1:23 PM    [place]

## 2019-10-07 NOTE — Addendum Note (Signed)
Addended by: Sandi Mariscal on: 10/07/2019 10:08 AM   Modules accepted: Orders

## 2019-10-15 LAB — BASIC METABOLIC PANEL
BUN/Creatinine Ratio: 18 (ref 12–28)
BUN: 13 mg/dL (ref 8–27)
CO2: 26 mmol/L (ref 20–29)
Calcium: 9.2 mg/dL (ref 8.7–10.3)
Chloride: 98 mmol/L (ref 96–106)
Creatinine, Ser: 0.72 mg/dL (ref 0.57–1.00)
GFR calc Af Amer: 95 mL/min/{1.73_m2} (ref >59)
GFR calc non Af Amer: 82 mL/min/{1.73_m2} (ref >59)
Glucose: 98 mg/dL (ref 65–99)
Potassium: 4.8 mmol/L (ref 3.5–5.2)
Sodium: 136 mmol/L (ref 134–144)

## 2019-10-20 ENCOUNTER — Telehealth (HOSPITAL_COMMUNITY): Payer: Self-pay | Admitting: Emergency Medicine

## 2019-10-20 NOTE — Telephone Encounter (Signed)
Attempted to call patient regarding upcoming cardiac CT appointment. °Left message on voicemail with name and callback number °Eliazar Olivar RN Navigator Cardiac Imaging °[facility] °[phone] Office °[phone] Cell ° °

## 2019-10-24 ENCOUNTER — Other Ambulatory Visit: Payer: Self-pay

## 2019-10-24 ENCOUNTER — Ambulatory Visit (HOSPITAL_COMMUNITY)
Admission: RE | Admit: 2019-10-24 | Discharge: 2019-10-24 | Disposition: A | Discharge status: Home / Self Care | Payer: Medicare Other | Source: Ambulatory Visit | Attending: Cardiovascular Disease | Admitting: Cardiovascular Disease

## 2019-10-24 DIAGNOSIS — I208 Other forms of angina pectoris: Secondary | ICD-10-CM | POA: Diagnosis not present

## 2019-10-24 DIAGNOSIS — R931 Abnormal findings on diagnostic imaging of heart and coronary circulation: Secondary | ICD-10-CM | POA: Diagnosis present

## 2019-10-24 MED ORDER — NITROGLYCERIN 0.4 MG SL SUBL
SUBLINGUAL_TABLET | SUBLINGUAL | Status: AC
  Administered 2019-10-24: 0.8 mg via SUBLINGUAL
  Filled 2019-10-24: qty 2

## 2019-10-24 MED ORDER — NITROGLYCERIN 0.4 MG SL SUBL: 0.8000 mg | SUBLINGUAL_TABLET | Freq: Once | SUBLINGUAL | Status: AC

## 2019-10-24 MED ORDER — IOHEXOL 350 MG/ML SOLN
80.0000 mL | Freq: Once | INTRAVENOUS | Status: AC | PRN
  Administered 2019-10-24: 80 mL via INTRAVENOUS

## 2019-10-25 DIAGNOSIS — I208 Other forms of angina pectoris: Secondary | ICD-10-CM | POA: Diagnosis not present

## 2019-10-25 DIAGNOSIS — R931 Abnormal findings on diagnostic imaging of heart and coronary circulation: Secondary | ICD-10-CM

## 2019-10-25 DIAGNOSIS — Z6823 Body mass index (BMI) 23.0-23.9, adult: Secondary | ICD-10-CM

## 2019-10-25 DIAGNOSIS — R0789 Other chest pain: Secondary | ICD-10-CM

## 2019-10-27 ENCOUNTER — Encounter: Payer: Self-pay | Admitting: Internal Medicine

## 2019-10-28 NOTE — Progress Notes (Signed)
Got it. Will attempt medical therapy first.

## 2019-11-23 ENCOUNTER — Other Ambulatory Visit: Payer: Self-pay | Admitting: Internal Medicine

## 2019-11-23 DIAGNOSIS — Z1231 Encounter for screening mammogram for malignant neoplasm of breast: Secondary | ICD-10-CM

## 2019-12-09 ENCOUNTER — Encounter: Payer: Self-pay | Admitting: Physician Assistant

## 2019-12-09 ENCOUNTER — Ambulatory Visit (INDEPENDENT_AMBULATORY_CARE_PROVIDER_SITE_OTHER): Payer: Medicare Other | Admitting: Physician Assistant

## 2019-12-09 ENCOUNTER — Other Ambulatory Visit: Payer: Self-pay

## 2019-12-09 VITALS — BP 152/82 | HR 94 | Ht 65.0 in | Wt 144.2 lb

## 2019-12-09 DIAGNOSIS — E7800 Pure hypercholesterolemia, unspecified: Secondary | ICD-10-CM | POA: Diagnosis not present

## 2019-12-09 DIAGNOSIS — I2510 Atherosclerotic heart disease of native coronary artery without angina pectoris: Secondary | ICD-10-CM

## 2019-12-09 DIAGNOSIS — I25118 Atherosclerotic heart disease of native coronary artery with other forms of angina pectoris: Secondary | ICD-10-CM

## 2019-12-09 DIAGNOSIS — Z79899 Other long term (current) drug therapy: Secondary | ICD-10-CM | POA: Diagnosis not present

## 2019-12-09 MED ORDER — ROSUVASTATIN CALCIUM 40 MG PO TABS: 40.0000 mg | ORAL_TABLET | Freq: Every day | ORAL | 3 refills | Status: DC

## 2019-12-09 MED ORDER — METOPROLOL SUCCINATE ER 50 MG PO TB24: 50.0000 mg | ORAL_TABLET | Freq: Every day | ORAL | 1 refills | Status: DC

## 2019-12-09 NOTE — Patient Instructions (Signed)
Medication Instructions:  INCREASE- Metoprolol succinate 50 mg by mouth daily INCREASE- Crestor 40 mg by mouth daily  *If you need a refill on your cardiac medications before your next appointment, please call your pharmacy*   Lab Work: Fasting Lipid and Liver in 2 Months  If you have labs (blood work) drawn today and your tests are completely normal, you will receive your results only by: Marland Kitchen MyChart Message (if you have MyChart) OR . A paper copy in the mail If you have any lab test that is abnormal or we need to change your treatment, we will call you to review the results.   Testing/Procedures: None Ordered   Follow-Up: At [facility], you and your health needs are our priority.  As part of our continuing mission to provide you with exceptional heart care, we have created designated Provider Care Teams.  These Care Teams include your primary Cardiologist (physician) and Advanced Practice Providers (APPs -  Physician Assistants and Nurse Practitioners) who all work together to provide you with the care you need, when you need it.  We recommend signing up for the patient portal called "MyChart".  Sign up information is provided on this After Visit Summary.  MyChart is used to connect with patients for Virtual Visits (Telemedicine).  Patients are able to view lab/test results, encounter notes, upcoming appointments, etc.  Non-urgent messages can be sent to your provider as well.   To learn more about what you can do with MyChart, go to [email or web address].    Your next appointment:   6 week(s)  The format for your next appointment:   In Person  Provider:   You will see one of the following Advanced Practice Providers on your designated Care Team:    Azalee Course, [place]   Your physician recommends that you schedule a follow-up appointment in: 3 Months with Dr Royann Shivers

## 2019-12-09 NOTE — Progress Notes (Addendum)
Cardiology Office Note:    Date:  12/11/2019   ID:  Michelle Hubbard, DOB 1945/09/30, MRN [id number]  PCP:  Michelle Levins, MD  [facility] Cardiologist:  Michelle Fair, MD  [facility] Electrophysiologist:  None   Referring MD: Michelle Levins, MD   Chief Complaint  Patient presents with  . Follow-up    seen for Dr. Royann Hubbard    History of Present Illness:    Michelle Hubbard is a 74 y.o. female with a hx of tobacco use, hyperlipidemia, and coronary calcification.  Patient was referred to cardiology service after a coronary calcium score that showed Agaston score of 100 which placed the patient in 91st percentile for age and sex matched control.  When she was seen by Dr. Royann Hubbard on 10/06/2019, she complained of dyspnea on exertion and chest tightness for months if not years.  A coronary CT was ordered.  Coronary CT performed on 10/24/2019 showed coronary calcium score of 675 which placed the patient in 90th percentile for age and sex matched control, 50 to 69% stenosis in mid LAD and mid to distal LAD, mild disease in the left circumflex artery, RCA and the left main.  Stable tiny 3 mm left lower lobe lung nodule.  Subsequent FFR revealed normal FFR in left main, LAD, OM and RCA, positive FFR of 0.73 in the distal left circumflex which was a small vessel and medical therapy was recommended.  Patient presents today for follow-up.  Her angina seems to be fairly stable.  It only occurs when she is doing more strenuous activity but not with every day activity.  She does not have any lower extremity edema, orthopnea or PND.  She does not have any significant heart murmur on exam.  We have thoroughly reviewed the recent coronary CT result and I gave her paper copy of her report as well.  We discussed the importance of tobacco cessation tight control of her cholesterol.  Looking back about her cholesterol is not very well controlled, I will increase Crestor to 40 mg daily.  She will need fasting  lipid panel and LFT in 6 to 8 weeks.  I will also increase metoprolol to 50 mg daily for antianginal purposes ended to decrease cardiac workload.  I plan to see the patient back in 6 weeks and at which time if she is still having occasional chest discomfort, I likely will add either long-acting nitrate or amlodipine.  She can follow-up with Dr. Royann Hubbard in 3 months.    Past Medical History:  Diagnosis Date  . 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 COPD 01/08/2010  . DEPRESSIVE DISORDER 12/23/2006  . FATIGUE 01/08/2010  . HYPERLIPIDEMIA 01/08/2010  . LOW BACK PAIN 12/23/2006  . OAB (overactive bladder) 10/07/2011  . OSTEOPOROSIS 01/08/2010  . 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 PONV (postoperative nausea and vomiting)   . SINUSITIS- ACUTE-NOS 07/09/2007  . Urticaria   . Vitamin D deficiency 04/30/2010    Past Surgical History:  Procedure Laterality Date  . ABDOMINAL HYSTERECTOMY    . APPENDECTOMY  1988  . BREAST BIOPSY  1986  . COLON RESECTION N/A 06/03/2013   Procedure: LAPAROSCOPY DIAGNOSTIC and OPEN  SIGMOID COLON RESECTION ;  Surgeon: Axel Filler, MD;  Location: [facility];  Service: General;  Laterality: N/A;  . COLON SURGERY  2000   colon obstruction  . larygenscopy    . OOPHORECTOMY  bilat 1988   secondary to cyst    Current  Medications: Current Meds  Medication Sig  . antiseptic oral rinse (BIOTENE) LIQD 15 mLs by Mouth Rinse route as needed for dry mouth.  Marland Kitchen. aspirin 325 MG tablet Take 325 mg by mouth daily.   . Cholecalciferol (VITAMIN D-3 PO) Take by mouth.  . citalopram (CELEXA) 10 MG tablet Take 1 tablet by mouth once daily  . levocetirizine (XYZAL) 5 MG tablet Take 5 mg by mouth every evening.  . metoprolol succinate (TOPROL-XL) 50 MG 24 hr tablet Take 1 tablet (50 mg total) by mouth daily. Take with or immediately following a meal.  . nitroGLYCERIN (NITROSTAT) 0.4 MG SL tablet Place 1 tablet (0.4 mg total) under the tongue every 5 (five)  minutes as needed for chest pain.  Marland Kitchen. omeprazole (PRILOSEC) 10 MG capsule Take 10 mg by mouth as needed.  . polyethylene glycol (MIRALAX / GLYCOLAX) 17 g packet Take 17 g by mouth daily.  . rosuvastatin (CRESTOR) 40 MG tablet Take 1 tablet (40 mg total) by mouth daily.  . Saline 0.65 % (Soln) SOLN Place 1 spray into the nose as needed.  . temazepam (RESTORIL) 15 MG capsule TAKE 2 CAPSULES BY MOUTH AT BEDTIME AS NEEDED  . [DISCONTINUED] metoprolol succinate (TOPROL-XL) 25 MG 24 hr tablet Take 1 tablet (25 mg total) by mouth daily. Take with or immediately following a meal.  . [DISCONTINUED] rosuvastatin (CRESTOR) 20 MG tablet Take 1 tablet (20 mg total) by mouth daily.     Allergies:   Codeine, Lortab [hydrocodone-acetaminophen], and Penicillins   Social History   Socioeconomic History  . Marital status: Widowed    Spouse name: Not on file  . Number of children: 3  . Years of education: Not on file  . Highest education level: Not on file  Occupational History  . Occupation: Retired    Associate Professormployer: Designer, television[employer]  Tobacco Use  . Smoking status: Current Every Day Smoker    Packs/day: 1.00    Years: 44.00    Pack years: 44.00    Types: Cigarettes  . Smokeless tobacco: Never Used  . Tobacco comment: smoking more due to move/ trying to quit  Vaping Use  . Vaping Use: Never used  Substance and Sexual Activity  . Alcohol use: Yes    Alcohol/week: 0.0 standard drinks    Comment: vodka nightly  . Drug use: No  . Sexual activity: Not Currently  Other Topics Concern  . Not on file  Social History Narrative  . Not on file   Social Determinants of Health   Financial Resource Strain: Low Risk   . Difficulty of Paying Living Expenses: Not hard at all  Food Insecurity: No Food Insecurity  . Worried About Programme researcher, broadcasting/film/videounning Out of Food in the Last Year: Never true  . Ran Out of Food in the Last Year: Never true  Transportation Needs: No Transportation Needs  . Lack of Transportation  (Medical): No  . Lack of Transportation (Non-Medical): No  Physical Activity: Sufficiently Active  . Days of Exercise per Week: 5 days  . Minutes of Exercise per Session: 30 min  Stress: No Stress Concern Present  . Feeling of Stress : Not at all  Social Connections: Moderately Integrated  . Frequency of Communication with Friends and Family: More than three times a week  . Frequency of Social Gatherings with Friends and Family: More than three times a week  . Attends Religious Services: More than 4 times per year  . Active Member of Clubs or Organizations: Yes  .  Attends Banker Meetings: More than 4 times per year  . Marital Status: Widowed     Family History: The patient's family history includes Colon cancer in her brother and mother; Diabetes in her maternal aunt and maternal grandmother. There is no history of Allergic rhinitis, Angioedema, Asthma, Eczema, Immunodeficiency, or Urticaria.  ROS:   Please see the history of present illness.     All other systems reviewed and are negative.  EKGs/Labs/Other Studies Reviewed:    The following studies were reviewed today:  Coronary CT 10/24/2019 IMPRESSION: 1. Coronary calcium score of 675. This was 90 percentile for age and sex matched control.  2. Normal coronary origin with right dominance.  3. CAD with most significant being moderate (50-69%) stenoses in the mid LAD and mid to distal LAD; CAD-RADS 3; study will be sent for FFR.  IMPRESSION: 1.  CT FFR analysis didn't show any significant stenosis.  1. LM: findings 1.00, 0.99  2. LAD: findings 0.98, 0.92 0.83  3. LCX: findings 0.98, 0.96 0.73  4. OM: findings 0.97, 0.95  5. RCA: findings 0.97, 0.92 0.91  FFR consistent with significant stenosis in distal LCX 0.73; however vessel is small distally and medical therapy likely best option.  EKG:  EKG is not ordered today.    Recent Labs: 08/16/2019: ALT 12; Hemoglobin 14.6; Platelets 364; TSH  1.53 10/14/2019: BUN 13; Creatinine, Ser 0.72; Potassium 4.8; Sodium 136  Recent Lipid Panel    Component Value Date/Time   CHOL 205 (H) 08/16/2019 1518   TRIG 92 08/16/2019 1518   HDL 66 08/16/2019 1518   CHOLHDL 3.1 08/16/2019 1518   VLDL 22.6 08/12/2018 1457   LDLCALC 119 (H) 08/16/2019 1518   LDLDIRECT 146.3 10/07/2011 0919     Risk Assessment/Calculations:       Physical Exam:    VS:  BP (!) 152/82   Pulse 94   Ht 5\' 5"  (1.651 m)   Wt 144 lb 3.2 oz (65.4 kg)   SpO2 95%   BMI 24.00 kg/m     Wt Readings from Last 3 Encounters:  12/09/19 144 lb 3.2 oz (65.4 kg)  10/06/19 142 lb (64.4 kg)  08/16/19 139 lb 12.8 oz (63.4 kg)     GEN:  Well nourished, well developed in no acute distress HEENT: Normal NECK: No JVD; No carotid bruits LYMPHATICS: No lymphadenopathy CARDIAC: RRR, no murmurs, rubs, gallops RESPIRATORY:  Clear to auscultation without rales, wheezing or rhonchi  ABDOMEN: Soft, non-tender, non-distended MUSCULOSKELETAL:  No edema; No deformity  SKIN: Warm and dry NEUROLOGIC:  Alert and oriented x 3 PSYCHIATRIC:  Normal affect   ASSESSMENT:    1. Coronary artery disease of native artery of native heart with stable angina pectoris (HCC)   2. Hypercholesterolemia   3. Medication management    PLAN:    In order of problems listed above:  1. CAD: Seen on coronary CT.  FFR showed severe distal left circumflex disease.  The distal artery is too small for stenting.  Recommend medical therapy.  I increased her metoprolol succinate to 50 mg daily.  I plan to bring the patient back in a few weeks and consider addition of either long-acting nitrate or amlodipine if she is still symptomatic.  2. Hyperlipidemia:  Increase Crestor to 40 mg daily.  Fasting lipid panel and LFT in 6 to 8 weeks.    Shared Decision Making/Informed Consent        Medication Adjustments/Labs and Tests Ordered: Current medicines are  reviewed at length with the patient today.   Concerns regarding medicines are outlined above.  Orders Placed This Encounter  Procedures  . Lipid panel  . Hepatic function panel   Meds ordered this encounter  Medications  . metoprolol succinate (TOPROL-XL) 50 MG 24 hr tablet    Sig: Take 1 tablet (50 mg total) by mouth daily. Take with or immediately following a meal.    Dispense:  90 tablet    Refill:  1  . rosuvastatin (CRESTOR) 40 MG tablet    Sig: Take 1 tablet (40 mg total) by mouth daily.    Dispense:  90 tablet    Refill:  3    Patient Instructions  Medication Instructions:  INCREASE- Metoprolol succinate 50 mg by mouth daily INCREASE- Crestor 40 mg by mouth daily  *If you need a refill on your cardiac medications before your next appointment, please call your pharmacy*   Lab Work: Fasting Lipid and Liver in 2 Months  If you have labs (blood work) drawn today and your tests are completely normal, you will receive your results only by: Marland Kitchen MyChart Message (if you have MyChart) OR . A paper copy in the mail If you have any lab test that is abnormal or we need to change your treatment, we will call you to review the results.   Testing/Procedures: None Ordered   Follow-Up: At [facility], you and your health needs are our priority.  As part of our continuing mission to provide you with exceptional heart care, we have created designated Provider Care Teams.  These Care Teams include your primary Cardiologist (physician) and Advanced Practice Providers (APPs -  Physician Assistants and Nurse Practitioners) who all work together to provide you with the care you need, when you need it.  We recommend signing up for the patient portal called "MyChart".  Sign up information is provided on this After Visit Summary.  MyChart is used to connect with patients for Virtual Visits (Telemedicine).  Patients are able to view lab/test results, encounter notes, upcoming appointments, etc.  Non-urgent messages can be sent to your  provider as well.   To learn more about what you can do with MyChart, go to [email or web address].    Your next appointment:   6 week(s)  The format for your next appointment:   In Person  Provider:   You will see one of the following Advanced Practice Providers on your designated Care Team:    Azalee Course, [place]   Your physician recommends that you schedule a follow-up appointment in: 3 Months with Dr Michelle Hubbard          Signed, Azalee Course, [place]  12/11/2019 9:33 PM    [facility]

## 2019-12-11 ENCOUNTER — Encounter: Payer: Self-pay | Admitting: Physician Assistant

## 2019-12-12 NOTE — Progress Notes (Signed)
Thank you :)

## 2019-12-29 ENCOUNTER — Other Ambulatory Visit: Payer: Self-pay

## 2019-12-29 ENCOUNTER — Ambulatory Visit
Admission: RE | Admit: 2019-12-29 | Discharge: 2019-12-29 | Disposition: A | Discharge status: Home / Self Care | Payer: Medicare Other | Source: Ambulatory Visit | Attending: Internal Medicine | Admitting: Internal Medicine

## 2019-12-29 DIAGNOSIS — Z1231 Encounter for screening mammogram for malignant neoplasm of breast: Secondary | ICD-10-CM

## 2020-01-10 ENCOUNTER — Other Ambulatory Visit: Payer: Self-pay | Admitting: *Deleted

## 2020-01-10 MED ORDER — METOPROLOL SUCCINATE ER 25 MG PO TB24: 25.0000 mg | ORAL_TABLET | Freq: Every day | ORAL | 1 refills | Status: DC

## 2020-01-17 ENCOUNTER — Ambulatory Visit: Payer: Medicare Other | Admitting: Physician Assistant

## 2020-01-18 ENCOUNTER — Encounter: Payer: Self-pay | Admitting: Internal Medicine

## 2020-01-18 ENCOUNTER — Ambulatory Visit: Payer: Medicare Other | Admitting: Physician Assistant

## 2020-01-25 LAB — LIPID PANEL
Chol/HDL Ratio: 2.1 ratio (ref 0.0–4.4)
Cholesterol, Total: 133 mg/dL (ref 100–199)
HDL: 62 mg/dL (ref >39)
LDL Chol Calc (NIH): 57 mg/dL (ref 0–99)
Triglycerides: 70 mg/dL (ref 0–149)
VLDL Cholesterol Cal: 14 mg/dL (ref 5–40)

## 2020-01-25 LAB — HEPATIC FUNCTION PANEL
ALT: 20 IU/L (ref 0–32)
AST: 22 IU/L (ref 0–40)
Albumin: 4.5 g/dL (ref 3.7–4.7)
Alkaline Phosphatase: 74 IU/L (ref 44–121)
Bilirubin Total: 0.3 mg/dL (ref 0.0–1.2)
Bilirubin, Direct: 0.11 mg/dL (ref 0.00–0.40)
Total Protein: 7.3 g/dL (ref 6.0–8.5)

## 2020-02-16 ENCOUNTER — Ambulatory Visit (INDEPENDENT_AMBULATORY_CARE_PROVIDER_SITE_OTHER): Payer: Medicare PPO | Admitting: Physician Assistant

## 2020-02-16 ENCOUNTER — Encounter: Payer: Self-pay | Admitting: Physician Assistant

## 2020-02-16 ENCOUNTER — Other Ambulatory Visit: Payer: Self-pay

## 2020-02-16 VITALS — BP 136/82 | HR 80 | Ht 65.0 in | Wt 139.6 lb

## 2020-02-16 DIAGNOSIS — I25119 Atherosclerotic heart disease of native coronary artery with unspecified angina pectoris: Secondary | ICD-10-CM | POA: Diagnosis not present

## 2020-02-16 DIAGNOSIS — E785 Hyperlipidemia, unspecified: Secondary | ICD-10-CM

## 2020-02-16 NOTE — Progress Notes (Signed)
Cardiology Office Note:    Date:  02/18/2020   ID:  Michelle Hubbard, DOB 1945-07-16, MRN [id number]  PCP:  Corwin Levins, MD  [facility] Cardiologist:  Thurmon Fair, MD  [facility] Electrophysiologist:  None   Referring MD: Corwin Levins, MD   Chief Complaint  Patient presents with  . Follow-up    Seen for Dr. Royann Shivers    History of Present Illness:    Michelle Hubbard is a 75 y.o. female with a hx of tobacco use, hyperlipidemia, and coronary calcification.  Patient was referred to cardiology service after a coronary calcium score that showed Agaston score of 100 which placed the patient in 91st percentile for age and sex matched control.  When she was seen by Dr. Royann Shivers on 10/06/2019, she complained of dyspnea on exertion and chest tightness for months if not years.  A coronary CT was ordered.  Coronary CT performed on 10/24/2019 showed coronary calcium score of 675 which placed the patient in 90th percentile for age and sex matched control, 50 to 69% stenosis in mid LAD and mid to distal LAD, mild disease in the left circumflex artery, RCA and left main.  Stable tiny 3 mm left lower lobe lung nodule.  Subsequent FFR revealed normal FFR in left main, LAD, OM and RCA, positive FFR of 0.73 in the distal left circumflex which was a small vessel and medical therapy was recommended.    During the last visit, she still had some discomfort with more strenuous activity, therefore I increased her metoprolol succinate to 50 mg daily for antianginal purposes.  I also increased her Crestor to 40 mg daily to better control her cholesterol.  Unfortunately she could not tolerate higher dose of metoprolol succinate due to fatigue, metoprolol was cut back down to 25 mg daily.  At the same time, she had a lot of infection in the teeth and has to undergo dental procedures.  She mention she has been feeling poorly for the past month which is likely related to the infection.  She does have increased  joint aches recently, I am not entirely clear if this is related to her baseline arthritis or increased dose of Crestor.  Since the Crestor was increased, her cholesterol is very well controlled.  But with her increased joint ache, I discussed several alternatives.  For the time being I recommended she cut back on the Crestor to 20 mg daily for the next 2 weeks, if the joint ache does not change, then it is not the Crestor that is causing the issue and that she should go back to 40 mg daily of Crestor.  If the joint ache does improve, she should still go back to 40 mg daily of Crestor afterward to rechallenge herself again to see if the joint ache worsens.  If it does worsen with the higher dose of Crestor, then she should contact cardiology service and we will discuss alternative treatment plan.   Past Medical History:  Diagnosis Date  . 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 COPD 01/08/2010  . DEPRESSIVE DISORDER 12/23/2006  . FATIGUE 01/08/2010  . HYPERLIPIDEMIA 01/08/2010  . LOW BACK PAIN 12/23/2006  . OAB (overactive bladder) 10/07/2011  . OSTEOPOROSIS 01/08/2010  . 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 PONV (postoperative nausea and vomiting)   . SINUSITIS- ACUTE-NOS 07/09/2007  . Urticaria   . Vitamin D deficiency 04/30/2010    Past Surgical History:  Procedure Laterality Date  .  ABDOMINAL HYSTERECTOMY    . APPENDECTOMY  1988  . BREAST BIOPSY  1986  . COLON RESECTION N/A 06/03/2013   Procedure: LAPAROSCOPY DIAGNOSTIC and OPEN  SIGMOID COLON RESECTION ;  Surgeon: Axel Filler, MD;  Location: [facility];  Service: General;  Laterality: N/A;  . COLON SURGERY  2000   colon obstruction  . larygenscopy    . OOPHORECTOMY  bilat 1988   secondary to cyst    Current Medications: Current Meds  Medication Sig  . amoxicillin (AMOXIL) 500 MG capsule Take 500 mg by mouth 3 (three) times daily.  Marland Kitchen antiseptic oral rinse (BIOTENE) LIQD 15 mLs by Mouth Rinse route as needed  for dry mouth.  Marland Kitchen aspirin 325 MG tablet Take 325 mg by mouth daily.   . Cholecalciferol (VITAMIN D-3 PO) Take by mouth.  . citalopram (CELEXA) 10 MG tablet Take 1 tablet by mouth once daily  . levocetirizine (XYZAL) 5 MG tablet Take 5 mg by mouth every evening.  . metoprolol succinate (TOPROL-XL) 25 MG 24 hr tablet Take 1 tablet (25 mg total) by mouth daily. Take with or immediately following a meal.  . nitroGLYCERIN (NITROSTAT) 0.4 MG SL tablet Place 1 tablet (0.4 mg total) under the tongue every 5 (five) minutes as needed for chest pain.  Marland Kitchen omeprazole (PRILOSEC) 10 MG capsule Take 10 mg by mouth as needed.  . polyethylene glycol (MIRALAX / GLYCOLAX) 17 g packet Take 17 g by mouth daily.  . rosuvastatin (CRESTOR) 40 MG tablet Take 1 tablet (40 mg total) by mouth daily.  . Saline 0.65 % (Soln) SOLN Place 1 spray into the nose as needed.  . temazepam (RESTORIL) 15 MG capsule TAKE 2 CAPSULES BY MOUTH AT BEDTIME AS NEEDED     Allergies:   Codeine, Lortab [hydrocodone-acetaminophen], and Penicillins   Social History   Socioeconomic History  . Marital status: Widowed    Spouse name: Not on file  . Number of children: 3  . Years of education: Not on file  . Highest education level: Not on file  Occupational History  . Occupation: Retired    Associate Professor: Designer, television/film set [employer]  Tobacco Use  . Smoking status: Current Every Day Smoker    Packs/day: 1.00    Years: 44.00    Pack years: 44.00    Types: Cigarettes  . Smokeless tobacco: Never Used  . Tobacco comment: smoking more due to move/ trying to quit  Vaping Use  . Vaping Use: Never used  Substance and Sexual Activity  . Alcohol use: Yes    Alcohol/week: 0.0 standard drinks    Comment: vodka nightly  . Drug use: No  . Sexual activity: Not Currently  Other Topics Concern  . Not on file  Social History Narrative  . Not on file   Social Determinants of Health   Financial Resource Strain: Low Risk   . Difficulty of Paying Living  Expenses: Not hard at all  Food Insecurity: No Food Insecurity  . Worried About Programme researcher, broadcasting/film/video in the Last Year: Never true  . Ran Out of Food in the Last Year: Never true  Transportation Needs: No Transportation Needs  . Lack of Transportation (Medical): No  . Lack of Transportation (Non-Medical): No  Physical Activity: Sufficiently Active  . Days of Exercise per Week: 5 days  . Minutes of Exercise per Session: 30 min  Stress: No Stress Concern Present  . Feeling of Stress : Not at all  Social Connections: Moderately Integrated  .  Frequency of Communication with Friends and Family: More than three times a week  . Frequency of Social Gatherings with Friends and Family: More than three times a week  . Attends Religious Services: More than 4 times per year  . Active Member of Clubs or Organizations: Yes  . Attends BankerClub or Organization Meetings: More than 4 times per year  . Marital Status: Widowed     Family History: The patient's family history includes Colon cancer in her brother and mother; Diabetes in her maternal aunt and maternal grandmother. There is no history of Allergic rhinitis, Angioedema, Asthma, Eczema, Immunodeficiency, or Urticaria.  ROS:   Please see the history of present illness.     All other systems reviewed and are negative.  EKGs/Labs/Other Studies Reviewed:    The following studies were reviewed today:  Coronary CT 10/24/2019 IMPRESSION: 1. Coronary calcium score of 675. This was 90 percentile for age and sex matched control.  2. Normal coronary origin with right dominance.  3. CAD with most significant being moderate (50-69%) stenoses in the mid LAD and mid to distal LAD; CAD-RADS 3; study will be sent for FFR.  EKG:  EKG is not ordered today.    Recent Labs: 08/16/2019: Hemoglobin 14.6; Platelets 364; TSH 1.53 10/14/2019: BUN 13; Creatinine, Ser 0.72; Potassium 4.8; Sodium 136 01/24/2020: ALT 20  Recent Lipid Panel    Component Value  Date/Time   CHOL 133 01/24/2020 0816   TRIG 70 01/24/2020 0816   HDL 62 01/24/2020 0816   CHOLHDL 2.1 01/24/2020 0816   CHOLHDL 3.1 08/16/2019 1518   VLDL 22.6 08/12/2018 1457   LDLCALC 57 01/24/2020 0816   LDLCALC 119 (H) 08/16/2019 1518   LDLDIRECT 146.3 10/07/2011 0919     Risk Assessment/Calculations:       Physical Exam:    VS:  BP 136/82   Pulse 80   Ht 5\' 5"  (1.651 m)   Wt 139 lb 9.6 oz (63.3 kg)   SpO2 91%   BMI 23.23 kg/m     Wt Readings from Last 3 Encounters:  02/16/20 139 lb 9.6 oz (63.3 kg)  12/09/19 144 lb 3.2 oz (65.4 kg)  10/06/19 142 lb (64.4 kg)     GEN:  Well nourished, well developed in no acute distress HEENT: Normal NECK: No JVD; No carotid bruits LYMPHATICS: No lymphadenopathy CARDIAC: RRR, no murmurs, rubs, gallops RESPIRATORY:  Clear to auscultation without rales, wheezing or rhonchi  ABDOMEN: Soft, non-tender, non-distended MUSCULOSKELETAL:  No edema; No deformity  SKIN: Warm and dry NEUROLOGIC:  Alert and oriented x 3 PSYCHIATRIC:  Normal affect   ASSESSMENT:    1. Coronary artery disease involving native coronary artery of native heart with angina pectoris (HCC)   2. Hyperlipidemia LDL goal <70    PLAN:    In order of problems listed above:  1. CAD: Noted on previous coronary CT.  She has positive FFR in distal left circumflex territory involving a small vessel.  During the previous visit, she was having some chest discomfort.  I attempted to uptitrate her beta-blocker, unfortunately she was unable to take the higher dose of beta-blocker.  Since the previous visit, her chest discomfort has subsided.  I will continue observation  2. Hyperlipidemia: I previously increased her Crestor to 40 mg daily.  Recently she has been noticing some lower extremity joint ache, she is not sure if this is related to Crestor or arthritis.  I recommended decrease Crestor again to 20 mg daily for 2  weeks, if her joint pain does not change, then it is  unlikely caused by the Crestor and that she will need to increase it back up to 40 mg daily.  However if her joint pain does improve after Crestor was decreased, I would still recommend a trial on the higher dose of Crestor 40 mg again to see if her joint pain worsens with the higher dose of Crestor.        Medication Adjustments/Labs and Tests Ordered: Current medicines are reviewed at length with the patient today.  Concerns regarding medicines are outlined above.  No orders of the defined types were placed in this encounter.  No orders of the defined types were placed in this encounter.   Patient Instructions  Medication Instructions:  Continue current medications  *If you need a refill on your cardiac medications before your next appointment, please call your pharmacy*   Lab Work: None Ordered   Testing/Procedures: None Ordered   Follow-Up: At [facility], you and your health needs are our priority.  As part of our continuing mission to provide you with exceptional heart care, we have created designated Provider Care Teams.  These Care Teams include your primary Cardiologist (physician) and Advanced Practice Providers (APPs -  Physician Assistants and Nurse Practitioners) who all work together to provide you with the care you need, when you need it.  We recommend signing up for the patient portal called "MyChart".  Sign up information is provided on this After Visit Summary.  MyChart is used to connect with patients for Virtual Visits (Telemedicine).  Patients are able to view lab/test results, encounter notes, upcoming appointments, etc.  Non-urgent messages can be sent to your provider as well.   To learn more about what you can do with MyChart, go to [email or web address].    Your next appointment:   Keep appointment on March 18th @ 3:20 pm  The format for your next appointment:   In Person  Provider:   Thurmon Fair, MD      Signed, Azalee Course, [place]   02/18/2020 11:40 PM    [facility]

## 2020-02-16 NOTE — Patient Instructions (Signed)
Medication Instructions:  Continue current medications  *If you need a refill on your cardiac medications before your next appointment, please call your pharmacy*   Lab Work: None Ordered   Testing/Procedures: None Ordered   Follow-Up: At [facility], you and your health needs are our priority.  As part of our continuing mission to provide you with exceptional heart care, we have created designated Provider Care Teams.  These Care Teams include your primary Cardiologist (physician) and Advanced Practice Providers (APPs -  Physician Assistants and Nurse Practitioners) who all work together to provide you with the care you need, when you need it.  We recommend signing up for the patient portal called "MyChart".  Sign up information is provided on this After Visit Summary.  MyChart is used to connect with patients for Virtual Visits (Telemedicine).  Patients are able to view lab/test results, encounter notes, upcoming appointments, etc.  Non-urgent messages can be sent to your provider as well.   To learn more about what you can do with MyChart, go to [email or web address].    Your next appointment:   Keep appointment on March 18th @ 3:20 pm  The format for your next appointment:   In Person  Provider:   Thurmon Fair, MD

## 2020-02-18 ENCOUNTER — Encounter: Payer: Self-pay | Admitting: Physician Assistant

## 2020-03-22 ENCOUNTER — Encounter: Payer: Self-pay | Admitting: Internal Medicine

## 2020-03-22 MED ORDER — TEMAZEPAM 15 MG PO CAPS
ORAL_CAPSULE | ORAL | 5 refills | Status: DC
Start: 2020-03-22 — End: 2020-08-20

## 2020-03-22 NOTE — Telephone Encounter (Signed)
Ok this is done 

## 2020-04-13 ENCOUNTER — Encounter: Payer: Self-pay | Admitting: Cardiovascular Disease

## 2020-04-13 ENCOUNTER — Other Ambulatory Visit: Payer: Self-pay

## 2020-04-13 ENCOUNTER — Ambulatory Visit (INDEPENDENT_AMBULATORY_CARE_PROVIDER_SITE_OTHER): Payer: Medicare PPO | Admitting: Cardiovascular Disease

## 2020-04-13 VITALS — BP 155/84 | HR 71 | Ht 65.0 in | Wt 134.4 lb

## 2020-04-13 DIAGNOSIS — I700 Atherosclerosis of aorta: Secondary | ICD-10-CM | POA: Diagnosis not present

## 2020-04-13 DIAGNOSIS — F17200 Nicotine dependence, unspecified, uncomplicated: Secondary | ICD-10-CM | POA: Diagnosis not present

## 2020-04-13 DIAGNOSIS — E785 Hyperlipidemia, unspecified: Secondary | ICD-10-CM

## 2020-04-13 DIAGNOSIS — I25119 Atherosclerotic heart disease of native coronary artery with unspecified angina pectoris: Secondary | ICD-10-CM

## 2020-04-13 MED ORDER — ROSUVASTATIN CALCIUM 20 MG PO TABS
20.0000 mg | ORAL_TABLET | Freq: Every day | ORAL | 1 refills | Status: DC
Start: 2020-04-13 — End: 2020-10-16

## 2020-04-13 MED ORDER — METOPROLOL SUCCINATE ER 25 MG PO TB24: 12.5000 mg | ORAL_TABLET | Freq: Every day | ORAL | 1 refills | Status: DC

## 2020-04-13 MED ORDER — COQ10 100 MG PO CAPS: 3.0000 | ORAL_CAPSULE | Freq: Every day | ORAL | Status: AC

## 2020-04-13 MED ORDER — ASPIRIN EC 81 MG PO TBEC: 81.0000 mg | DELAYED_RELEASE_TABLET | Freq: Every day | ORAL | 3 refills | Status: DC

## 2020-04-13 MED ORDER — AMLODIPINE BESYLATE 5 MG PO TABS: 5.0000 mg | ORAL_TABLET | Freq: Every day | ORAL | 3 refills | Status: DC

## 2020-04-13 NOTE — Patient Instructions (Addendum)
Medication Instructions:  DECREASE the Aspirin to 81 mg once daily DECREASE the Rosuvastatin to 20 mg once daily DECREASE the Metoprolol Succinate to 12.5 mg (half a tablet) once daily  START CoQ10 300 mg once daily. This is an over the counter medication START Amlodipine 5 mg once daily  *If you need a refill on your cardiac medications before your next appointment, please call your pharmacy*   Lab Work: Your provider would like for you to return in 2 months before your next appointment to have the following labs drawn: fasting Lipid. You do not need an appointment for the lab. Once in our office lobby there is a podium where you can sign in and ring the doorbell to alert [place] that you are here. The lab is open from 8:00 am to 4:30 pm; closed for lunch from 12:45pm-1:45pm.  If you have labs (blood work) drawn today and your tests are completely normal, you will receive your results only by: Marland Kitchen MyChart Message (if you have MyChart) OR . A paper copy in the mail If you have any lab test that is abnormal or we need to change your treatment, we will call you to review the results.   Testing/Procedures: None ordered   Follow-Up: At [facility], you and your health needs are our priority.  As part of our continuing mission to provide you with exceptional heart care, we have created designated Provider Care Teams.  These Care Teams include your primary Cardiologist (physician) and Advanced Practice Providers (APPs -  Physician Assistants and Nurse Practitioners) who all work together to provide you with the care you need, when you need it.  We recommend signing up for the patient portal called "MyChart".  Sign up information is provided on this After Visit Summary.  MyChart is used to connect with patients for Virtual Visits (Telemedicine).  Patients are able to view lab/test results, encounter notes, upcoming appointments, etc.  Non-urgent messages can be sent to your provider as well.   To  learn more about what you can do with MyChart, go to [email or web address].    Your next appointment:   2 month(s)  The format for your next appointment:   In Person  Provider:   You may see Thurmon Fair, MD or one of the following Advanced Practice Providers on your designated Care Team:    Azalee Course, PA-C  Micah Flesher, PA-C or   Judy Pimple, [place]

## 2020-04-13 NOTE — Progress Notes (Signed)
Cardiology Office Note:    Date:  04/15/2020   ID:  Michelle Hubbard, DOB [birth year], MRN [id number]  PCP:  Corwin Levins, MD  [facility] Cardiologist:  Thurmon Fair, MD [facility] Electrophysiologist:  None   Referring MD: Corwin Levins, MD   Chief Complaint  Patient presents with  . Coronary Artery Disease    History of Present Illness:    Michelle Hubbard is a 75 y.o. female with CAD a hx of smoking and hypercholesterolemia, returning for follow-up.    She was initially referred after a coronary calcium CT which showed a Agatston score of 100 (91st percentile), the study being performed due to incidental finding of heavy coronary calcification on a screening CT for lung cancer.  She had symptoms of mild stable angina pectoris that she did not recognize as such.  Coronary CT angiography performed in September 2021 showed numerous mild and moderate stenotic lesions, especially in the mid and distal LAD artery and the distal left circumflex coronary artery (the latter lesion being the only one that appeared to be hemodynamically significant, with CT-FFR 0.73).  Her biggest complaint is of fatigue that started as soon as she started taking the beta-blocker and a statin.  She also had some complaints of weakness in her thigh muscles.  She continues to live independently and is active in her house and yard.  On just a low-dose of beta-blocker she has not had any further complaints of exertional angina.  She has not required sublingual nitroglycerin.  She did not tolerate full 40 mg dosing of rosuvastatin due to muscle aches, although this treatment did bring her LDL cholesterol down to 57 (baseline LDL of treatment was 119, but back in 2013 was as high as 146.3).  She has cut back the rosuvastatin to just 20 mg daily.  She is not taking aspirin daily (her medication list contains aspirin 3 and 25 mg, but she only takes this intermittently for aches and pains).  She denies  palpitations, dizziness, syncope, focal neurological complaints, intermittent claudication or change in exertional dyspnea.  She does not have lower extremity edema, orthopnea or PND.  She has a 44-pack-year history of smoking and is trying to quit, but has not been successful in stopping altogether yet.  She does not have diabetes mellitus although she has had some periods of hyperglycemia.  Not have hypertension.  She has never had a stroke or TIA.   Past Medical History:  Diagnosis Date  . 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 COPD 01/08/2010  . DEPRESSIVE DISORDER 12/23/2006  . FATIGUE 01/08/2010  . HYPERLIPIDEMIA 01/08/2010  . LOW BACK PAIN 12/23/2006  . OAB (overactive bladder) 10/07/2011  . OSTEOPOROSIS 01/08/2010  . 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 PONV (postoperative nausea and vomiting)   . SINUSITIS- ACUTE-NOS 07/09/2007  . Urticaria   . Vitamin D deficiency 04/30/2010    Past Surgical History:  Procedure Laterality Date  . ABDOMINAL HYSTERECTOMY    . APPENDECTOMY  1988  . BREAST BIOPSY  1986  . COLON RESECTION N/A 06/03/2013   Procedure: LAPAROSCOPY DIAGNOSTIC and OPEN  SIGMOID COLON RESECTION ;  Surgeon: Axel Filler, MD;  Location: [facility];  Service: General;  Laterality: N/A;  . COLON SURGERY  2000   colon obstruction  . larygenscopy    . OOPHORECTOMY  bilat 1988   secondary to cyst    Current Medications: Current Meds  Medication Sig  . amLODipine (NORVASC)  5 MG tablet Take 1 tablet (5 mg total) by mouth daily.  Marland Kitchen amoxicillin (AMOXIL) 500 MG capsule Take 500 mg by mouth 3 (three) times daily.  Marland Kitchen antiseptic oral rinse (BIOTENE) LIQD 15 mLs by Mouth Rinse route as needed for dry mouth.  Marland Kitchen aspirin EC 81 MG tablet Take 1 tablet (81 mg total) by mouth daily. Swallow whole.  . Cholecalciferol (VITAMIN D-3 PO) Take by mouth.  . citalopram (CELEXA) 10 MG tablet Take 1 tablet by mouth once daily  . Coenzyme Q10 (COQ10) 100 MG CAPS Take 3  capsules by mouth daily.  Marland Kitchen levocetirizine (XYZAL) 5 MG tablet Take 5 mg by mouth every evening.  Marland Kitchen omeprazole (PRILOSEC) 10 MG capsule Take 10 mg by mouth as needed.  . polyethylene glycol (MIRALAX / GLYCOLAX) 17 g packet Take 17 g by mouth daily.  . Saline 0.65 % (Soln) SOLN Place 1 spray into the nose as needed.  . temazepam (RESTORIL) 15 MG capsule 1-2 tab by mouth at bedtime as needed for sleep  . [DISCONTINUED] aspirin 325 MG tablet Take 325 mg by mouth daily.   . [DISCONTINUED] rosuvastatin (CRESTOR) 40 MG tablet Take 1 tablet (40 mg total) by mouth daily. (Patient taking differently: Take 20 mg by mouth daily. TOTAL OF 20 mg)     Allergies:   Codeine, Lortab [hydrocodone-acetaminophen], and Penicillins   Social History   Socioeconomic History  . Marital status: Widowed    Spouse name: Not on file  . Number of children: 3  . Years of education: Not on file  . Highest education level: Not on file  Occupational History  . Occupation: Retired    Associate Professor: Designer, television/film set [employer]  Tobacco Use  . Smoking status: Current Every Day Smoker    Packs/day: 1.00    Years: 44.00    Pack years: 44.00    Types: Cigarettes  . Smokeless tobacco: Never Used  . Tobacco comment: smoking more due to move/ trying to quit  Vaping Use  . Vaping Use: Never used  Substance and Sexual Activity  . Alcohol use: Yes    Alcohol/week: 0.0 standard drinks    Comment: vodka nightly  . Drug use: No  . Sexual activity: Not Currently  Other Topics Concern  . Not on file  Social History Narrative  . Not on file   Social Determinants of Health   Financial Resource Strain: Low Risk   . Difficulty of Paying Living Expenses: Not hard at all  Food Insecurity: No Food Insecurity  . Worried About Programme researcher, broadcasting/film/video in the Last Year: Never true  . Ran Out of Food in the Last Year: Never true  Transportation Needs: No Transportation Needs  . Lack of Transportation (Medical): No  . Lack of Transportation  (Non-Medical): No  Physical Activity: Sufficiently Active  . Days of Exercise per Week: 5 days  . Minutes of Exercise per Session: 30 min  Stress: No Stress Concern Present  . Feeling of Stress : Not at all  Social Connections: Moderately Integrated  . Frequency of Communication with Friends and Family: More than three times a week  . Frequency of Social Gatherings with Friends and Family: More than three times a week  . Attends Religious Services: More than 4 times per year  . Active Member of Clubs or Organizations: Yes  . Attends Banker Meetings: More than 4 times per year  . Marital Status: Widowed     Family History: The patient's  family history includes Colon cancer in her brother and mother; Diabetes in her maternal aunt and maternal grandmother. There is no history of Allergic rhinitis, Angioedema, Asthma, Eczema, Immunodeficiency, or Urticaria.  ROS:   Please see the history of present illness.     All other systems reviewed and are negative.  EKGs/Labs/Other Studies Reviewed:    The following studies were reviewed today: Coronary CT and CT FFR  EKG:  EKG is  ordered today.  The tracing performed today shows normal sinus rhythm and is completely normal  Recent Labs: 08/16/2019: Hemoglobin 14.6; Platelets 364; TSH 1.53 10/14/2019: BUN 13; Creatinine, Ser 0.72; Potassium 4.8; Sodium 136 01/24/2020: ALT 20  Recent Lipid Panel    Component Value Date/Time   CHOL 133 01/24/2020 0816   TRIG 70 01/24/2020 0816   HDL 62 01/24/2020 0816   CHOLHDL 2.1 01/24/2020 0816   CHOLHDL 3.1 08/16/2019 1518   VLDL 22.6 08/12/2018 1457   LDLCALC 57 01/24/2020 0816   LDLCALC 119 (H) 08/16/2019 1518   LDLDIRECT 146.3 10/07/2011 0919    Physical Exam:    VS:  BP (!) 155/84   Pulse 71   Ht 5\' 5"  (1.651 m)   Wt 134 lb 6.4 oz (61 kg)   SpO2 94%   BMI 22.37 kg/m     Wt Readings from Last 3 Encounters:  04/13/20 134 lb 6.4 oz (61 kg)  02/16/20 139 lb 9.6 oz (63.3 kg)   12/09/19 144 lb 3.2 oz (65.4 kg)     General: Alert, oriented x3, no distress, appears well.  Lean. Head: no evidence of trauma, PERRL, EOMI, no exophtalmos or lid lag, no myxedema, no xanthelasma; normal ears, nose and oropharynx Neck: normal jugular venous pulsations and no hepatojugular reflux; brisk carotid pulses without delay and no carotid bruits Chest: clear to auscultation, no signs of consolidation by percussion or palpation, normal fremitus, symmetrical and full respiratory excursions Cardiovascular: normal position and quality of the apical impulse, regular rhythm, normal first and second heart sounds, no murmurs, rubs or gallops Abdomen: no tenderness or distention, no masses by palpation, no abnormal pulsatility or arterial bruits, normal bowel sounds, no hepatosplenomegaly Extremities: no clubbing, cyanosis or edema; 2+ radial, ulnar and brachial pulses bilaterally; 2+ right femoral, posterior tibial and dorsalis pedis pulses; 2+ left femoral, posterior tibial and dorsalis pedis pulses; no subclavian or femoral bruits Neurological: grossly nonfocal Psych: Normal mood and affect  ASSESSMENT:    1. Coronary artery disease involving native coronary artery of native heart with angina pectoris (HCC)   2. Hyperlipidemia LDL goal <70   3. Smoking   4. Atherosclerosis of aorta (HCC)    PLAN:    In order of problems listed above:  1. Stable angina pectoris: Well-controlled with low-dose beta-blocker, but she has prominent complaints of fatigue.  Will reduce the dose of beta-blocker in half and add amlodipine as an alternative antianginal that would also help with her elevated blood pressure..  She should take aspirin 81 mg daily.  CT angiography did not show high risk anatomy.  Continue medical therapy, with option for invasive angiography if we cannot find a successful antianginal regimen. 2. HLP: I suspect that even on the lower dose of rosuvastatin her LDL may still remain in  target range.  Encourage her to take coenzyme Q 10 300 mg daily.  We will recheck her lipid profile. 3. Smoking: 44-pack-year history of smoking.  Strongly encouraged her to continue her efforts to quit smoking. 4. Atherosclerosis of  the aorta: This was noticed incidentally on the imaging studies; normal caliber aorta in the segments visualized.   Medication Adjustments/Labs and Tests Ordered: Current medicines are reviewed at length with the patient today.  Concerns regarding medicines are outlined above.  Orders Placed This Encounter  Procedures  . Lipid panel  . EKG 12-Lead   Meds ordered this encounter  Medications  . metoprolol succinate (TOPROL-XL) 25 MG 24 hr tablet    Sig: Take 0.5 tablets (12.5 mg total) by mouth daily. Take with or immediately following a meal.    Dispense:  45 tablet    Refill:  1  . rosuvastatin (CRESTOR) 20 MG tablet    Sig: Take 1 tablet (20 mg total) by mouth daily.    Dispense:  90 tablet    Refill:  1  . aspirin EC 81 MG tablet    Sig: Take 1 tablet (81 mg total) by mouth daily. Swallow whole.    Dispense:  90 tablet    Refill:  3  . Coenzyme Q10 (COQ10) 100 MG CAPS    Sig: Take 3 capsules by mouth daily.    Dispense:  30 capsule  . amLODipine (NORVASC) 5 MG tablet    Sig: Take 1 tablet (5 mg total) by mouth daily.    Dispense:  90 tablet    Refill:  3    Patient Instructions  Medication Instructions:  DECREASE the Aspirin to 81 mg once daily DECREASE the Rosuvastatin to 20 mg once daily DECREASE the Metoprolol Succinate to 12.5 mg (half a tablet) once daily  START CoQ10 300 mg once daily. This is an over the counter medication START Amlodipine 5 mg once daily  *If you need a refill on your cardiac medications before your next appointment, please call your pharmacy*   Lab Work: Your provider would like for you to return in 2 months before your next appointment to have the following labs drawn: fasting Lipid. You do not need an  appointment for the lab. Once in our office lobby there is a podium where you can sign in and ring the doorbell to alert [place] that you are here. The lab is open from 8:00 am to 4:30 pm; closed for lunch from 12:45pm-1:45pm.  If you have labs (blood work) drawn today and your tests are completely normal, you will receive your results only by: Marland Kitchen MyChart Message (if you have MyChart) OR . A paper copy in the mail If you have any lab test that is abnormal or we need to change your treatment, we will call you to review the results.   Testing/Procedures: None ordered   Follow-Up: At [facility], you and your health needs are our priority.  As part of our continuing mission to provide you with exceptional heart care, we have created designated Provider Care Teams.  These Care Teams include your primary Cardiologist (physician) and Advanced Practice Providers (APPs -  Physician Assistants and Nurse Practitioners) who all work together to provide you with the care you need, when you need it.  We recommend signing up for the patient portal called "MyChart".  Sign up information is provided on this After Visit Summary.  MyChart is used to connect with patients for Virtual Visits (Telemedicine).  Patients are able to view lab/test results, encounter notes, upcoming appointments, etc.  Non-urgent messages can be sent to your provider as well.   To learn more about what you can do with MyChart, go to [email or web address].    Your  next appointment:   2 month(s)  The format for your next appointment:   In Person  Provider:   You may see Samaj Wessells, MD or one of the following Advanced PracticThurmon Faire Providers on your designated Care Team:    Azalee CourseHao Meng, PA-C  Micah FlesherAngela Duke, [place]PA-C or   Judy PimpleKrista Kroeger, PA-C      Signed, Thurmon FairMihai Ajahni Nay, MD  04/15/2020 7:13 PM    [facility]

## 2020-04-15 ENCOUNTER — Encounter: Payer: Self-pay | Admitting: Cardiovascular Disease

## 2020-04-15 DIAGNOSIS — I25119 Atherosclerotic heart disease of native coronary artery with unspecified angina pectoris: Secondary | ICD-10-CM | POA: Insufficient documentation

## 2020-04-15 DIAGNOSIS — I700 Atherosclerosis of aorta: Secondary | ICD-10-CM | POA: Insufficient documentation

## 2020-06-05 LAB — LIPID PANEL
Chol/HDL Ratio: 2.1 ratio (ref 0.0–4.4)
Cholesterol, Total: 129 mg/dL (ref 100–199)
HDL: 62 mg/dL (ref >39)
LDL Chol Calc (NIH): 54 mg/dL (ref 0–99)
Triglycerides: 62 mg/dL (ref 0–149)
VLDL Cholesterol Cal: 13 mg/dL (ref 5–40)

## 2020-06-13 ENCOUNTER — Encounter: Payer: Self-pay | Admitting: Physician Assistant

## 2020-06-13 ENCOUNTER — Other Ambulatory Visit: Payer: Self-pay

## 2020-06-13 ENCOUNTER — Ambulatory Visit (INDEPENDENT_AMBULATORY_CARE_PROVIDER_SITE_OTHER): Payer: Medicare PPO | Admitting: Physician Assistant

## 2020-06-13 VITALS — BP 139/84 | HR 84 | Ht 65.0 in | Wt 132.8 lb

## 2020-06-13 DIAGNOSIS — R5383 Other fatigue: Secondary | ICD-10-CM

## 2020-06-13 DIAGNOSIS — I2510 Atherosclerotic heart disease of native coronary artery without angina pectoris: Secondary | ICD-10-CM | POA: Diagnosis not present

## 2020-06-13 DIAGNOSIS — E785 Hyperlipidemia, unspecified: Secondary | ICD-10-CM

## 2020-06-13 NOTE — Patient Instructions (Signed)

## 2020-06-13 NOTE — Progress Notes (Signed)
Cardiology Office Note:    Date:  06/15/2020   ID:  Michelle Hubbard, DOB February 17, 1945, MRN [id number]  PCP:  Corwin Levins, MD   [facility] Providers Cardiologist:  Thurmon Fair, MD     Referring MD: Corwin Levins, MD   Chief Complaint  Patient presents with  . Follow-up    Seen for Dr. Royann Shivers    History of Present Illness:    Michelle Hubbard is a 75 y.o. female with a hx of tobacco use, hyperlipidemia, and CAD. Patient was referred to cardiology service after a coronary calcium score that showed Agastonscore of 100 which placed the patient in 91st percentile for age and sex matched control. When she was seen by Dr. Royann Shivers on 10/06/2019, she complained of dyspnea on exertion and chest tightness for months if not years. A coronary CT was ordered. Coronary CT performed on 10/24/2019 showed coronary calcium score of 675 which placed the patient in 90th percentile for age and sex matched control, 50 to 69% stenosis in mid LAD and mid to distal LAD, mild disease in the left circumflex artery, RCA and left main. Stable tiny 3 mm left lower lobe lung nodule. Subsequent FFR revealed normal FFR in left main, LAD, OM and RCA, positive FFR of 0.73 in the distal left circumflex which was a small vessel and medical therapy was recommended.   She is unable to tolerate higher dose of metoprolol succinate.  I tried to increase her Crestor in the past to control her cholesterol however she had increased joint ache after the increase.  Crestor was cut back to 20 mg daily.  During the previous visit with Dr. Royann Shivers on 04/13/2020, aspirin was decreased to 81 mg daily.  Metoprolol succinate was decreased to 12.5 mg daily she was started on co-Q10 300 mg daily.  She was also given amlodipine 5 mg daily.  Repeat lipid panel obtained on 06/05/2020 showed a total cholesterol 129, HDL 62, triglycerides 62, LDL 54  Patient presents today along with her daughter.  She denies any recent chest pain worsening  shortness of breath.  However since metoprolol has been reduced, her fatigue has significantly improved.  She has felt the best in the recent few months that she had the entire past year.  Cholesterol is very well controlled.  I recommend a 55-month follow-up with Dr. Royann Shivers.  Past Medical History:  Diagnosis Date  . 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 COPD 01/08/2010  . DEPRESSIVE DISORDER 12/23/2006  . FATIGUE 01/08/2010  . HYPERLIPIDEMIA 01/08/2010  . LOW BACK PAIN 12/23/2006  . OAB (overactive bladder) 10/07/2011  . OSTEOPOROSIS 01/08/2010  . 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 PONV (postoperative nausea and vomiting)   . SINUSITIS- ACUTE-NOS 07/09/2007  . Urticaria   . Vitamin D deficiency 04/30/2010    Past Surgical History:  Procedure Laterality Date  . ABDOMINAL HYSTERECTOMY    . APPENDECTOMY  1988  . BREAST BIOPSY  1986  . COLON RESECTION N/A 06/03/2013   Procedure: LAPAROSCOPY DIAGNOSTIC and OPEN  SIGMOID COLON RESECTION ;  Surgeon: Axel Filler, MD;  Location: [facility];  Service: General;  Laterality: N/A;  . COLON SURGERY  2000   colon obstruction  . larygenscopy    . OOPHORECTOMY  bilat 1988   secondary to cyst    Current Medications: Current Meds  Medication Sig  . amLODipine (NORVASC) 5 MG tablet Take 1 tablet (5 mg total) by mouth daily.  Marland Kitchen antiseptic  oral rinse (BIOTENE) LIQD 15 mLs by Mouth Rinse route as needed for dry mouth.  Marland Kitchen aspirin EC 81 MG tablet Take 1 tablet (81 mg total) by mouth daily. Swallow whole.  . Cholecalciferol (VITAMIN D-3 PO) Take by mouth.  . citalopram (CELEXA) 10 MG tablet Take 1 tablet by mouth once daily  . Coenzyme Q10 (COQ10) 100 MG CAPS Take 3 capsules by mouth daily.  Marland Kitchen levocetirizine (XYZAL) 5 MG tablet Take 5 mg by mouth every evening.  . metoprolol succinate (TOPROL-XL) 25 MG 24 hr tablet Take 0.5 tablets (12.5 mg total) by mouth daily. Take with or immediately following a meal.  . omeprazole  (PRILOSEC) 10 MG capsule Take 10 mg by mouth as needed.  . polyethylene glycol (MIRALAX / GLYCOLAX) 17 g packet Take 17 g by mouth daily.  . rosuvastatin (CRESTOR) 20 MG tablet Take 1 tablet (20 mg total) by mouth daily.  . Saline 0.65 % (Soln) SOLN Place 1 spray into the nose as needed.  . temazepam (RESTORIL) 15 MG capsule 1-2 tab by mouth at bedtime as needed for sleep     Allergies:   Codeine, Lortab [hydrocodone-acetaminophen], and Penicillins   Social History   Socioeconomic History  . Marital status: Widowed    Spouse name: Not on file  . Number of children: 3  . Years of education: Not on file  . Highest education level: Not on file  Occupational History  . Occupation: Retired    Associate Professor: Designer, television/film set [employer]  Tobacco Use  . Smoking status: Current Every Day Smoker    Packs/day: 1.00    Years: 44.00    Pack years: 44.00    Types: Cigarettes  . Smokeless tobacco: Never Used  . Tobacco comment: smoking more due to move/ trying to quit  Vaping Use  . Vaping Use: Never used  Substance and Sexual Activity  . Alcohol use: Yes    Alcohol/week: 0.0 standard drinks    Comment: vodka nightly  . Drug use: No  . Sexual activity: Not Currently  Other Topics Concern  . Not on file  Social History Narrative  . Not on file   Social Determinants of Health   Financial Resource Strain: Low Risk   . Difficulty of Paying Living Expenses: Not hard at all  Food Insecurity: No Food Insecurity  . Worried About Programme researcher, broadcasting/film/video in the Last Year: Never true  . Ran Out of Food in the Last Year: Never true  Transportation Needs: No Transportation Needs  . Lack of Transportation (Medical): No  . Lack of Transportation (Non-Medical): No  Physical Activity: Sufficiently Active  . Days of Exercise per Week: 5 days  . Minutes of Exercise per Session: 30 min  Stress: No Stress Concern Present  . Feeling of Stress : Not at all  Social Connections: Moderately Integrated  . Frequency  of Communication with Friends and Family: More than three times a week  . Frequency of Social Gatherings with Friends and Family: More than three times a week  . Attends Religious Services: More than 4 times per year  . Active Member of Clubs or Organizations: Yes  . Attends Banker Meetings: More than 4 times per year  . Marital Status: Widowed     Family History: The patient's family history includes Colon cancer in her brother and mother; Diabetes in her maternal aunt and maternal grandmother. There is no history of Allergic rhinitis, Angioedema, Asthma, Eczema, Immunodeficiency, or Urticaria.  ROS:  Please see the history of present illness.     All other systems reviewed and are negative.  EKGs/Labs/Other Studies Reviewed:    The following studies were reviewed today:  Coronary CT 10/24/2019 IMPRESSION: 1. Coronary calcium score of 675. This was 90 percentile for age and sex matched control.  2. Normal coronary origin with right dominance.  3. CAD with most significant being moderate (50-69%) stenoses in the mid LAD and mid to distal LAD; CAD-RADS 3; study will be sent for FFR.  EKG:  EKG is not ordered today.    Recent Labs: 08/16/2019: Hemoglobin 14.6; Platelets 364; TSH 1.53 10/14/2019: BUN 13; Creatinine, Ser 0.72; Potassium 4.8; Sodium 136 01/24/2020: ALT 20  Recent Lipid Panel    Component Value Date/Time   CHOL 129 06/05/2020 0835   TRIG 62 06/05/2020 0835   HDL 62 06/05/2020 0835   CHOLHDL 2.1 06/05/2020 0835   CHOLHDL 3.1 08/16/2019 1518   VLDL 22.6 08/12/2018 1457   LDLCALC 54 06/05/2020 0835   LDLCALC 119 (H) 08/16/2019 1518   LDLDIRECT 146.3 10/07/2011 0919     Risk Assessment/Calculations:       Physical Exam:    VS:  BP 139/84 (BP Location: Right Arm, Patient Position: Sitting)   Pulse 84   Ht 5\' 5"  (1.651 m)   Wt 132 lb 12.8 oz (60.2 kg)   SpO2 93%   BMI 22.10 kg/m     Wt Readings from Last 3 Encounters:  06/13/20 132  lb 12.8 oz (60.2 kg)  04/13/20 134 lb 6.4 oz (61 kg)  02/16/20 139 lb 9.6 oz (63.3 kg)     GEN:  Well nourished, well developed in no acute distress HEENT: Normal NECK: No JVD; No carotid bruits LYMPHATICS: No lymphadenopathy CARDIAC: RRR, no murmurs, rubs, gallops RESPIRATORY:  Clear to auscultation without rales, wheezing or rhonchi  ABDOMEN: Soft, non-tender, non-distended MUSCULOSKELETAL:  No edema; No deformity  SKIN: Warm and dry NEUROLOGIC:  Alert and oriented x 3 PSYCHIATRIC:  Normal affect   ASSESSMENT:    No diagnosis found. PLAN:    In order of problems listed above:  1. CAD: Denies any recent chest pain.  Continue aspirin, beta-blocker and statin  2. Hyperlipidemia: On 20 mg daily of Crestor  3. Fatigue: Symptom has significantly improved after coming off of metoprolol.        Medication Adjustments/Labs and Tests Ordered: Current medicines are reviewed at length with the patient today.  Concerns regarding medicines are outlined above.  No orders of the defined types were placed in this encounter.  No orders of the defined types were placed in this encounter.   Patient Instructions  Medication Instructions:  Your physician recommends that you continue on your current medications as directed. Please refer to the Current Medication list given to you today.  *If you need a refill on your cardiac medications before your next appointment, please call your pharmacy*  Lab Work: NONE ordered at this time of appointment   If you have labs (blood work) drawn today and your tests are completely normal, you will receive your results only by: 02/18/20 MyChart Message (if you have MyChart) OR . A paper copy in the mail If you have any lab test that is abnormal or we need to change your treatment, we will call you to review the results.  Testing/Procedures: NONE ordered at this time of appointment   Follow-Up: At [facility], you and your health needs are our  priority.  As part  of our continuing mission to provide you with exceptional heart care, we have created designated Provider Care Teams.  These Care Teams include your primary Cardiologist (physician) and Advanced Practice Providers (APPs -  Physician Assistants and Nurse Practitioners) who all work together to provide you with the care you need, when you need it.  Your next appointment:   6 month(s)  The format for your next appointment:   In Person  Provider:   Thurmon FairMihai Croitoru, MD  Other Instructions      Signed, Azalee CourseHao Khaliah Barnick, [place]  06/15/2020 5:49 PM    [facility]

## 2020-06-15 ENCOUNTER — Encounter: Payer: Self-pay | Admitting: Physician Assistant

## 2020-06-18 ENCOUNTER — Encounter: Payer: Self-pay | Admitting: Internal Medicine

## 2020-06-24 ENCOUNTER — Emergency Department (HOSPITAL_COMMUNITY)
Admission: EM | Admit: 2020-06-24 | Discharge: 2020-06-24 | Disposition: A | Discharge status: Home / Self Care | Payer: Medicare PPO | Attending: Emergency Medicine | Admitting: Emergency Medicine

## 2020-06-24 ENCOUNTER — Encounter (HOSPITAL_COMMUNITY): Payer: Self-pay

## 2020-06-24 ENCOUNTER — Emergency Department (HOSPITAL_COMMUNITY): Payer: Medicare PPO

## 2020-06-24 ENCOUNTER — Other Ambulatory Visit: Payer: Self-pay

## 2020-06-24 DIAGNOSIS — F17210 Nicotine dependence, cigarettes, uncomplicated: Secondary | ICD-10-CM | POA: Diagnosis not present

## 2020-06-24 DIAGNOSIS — R600 Localized edema: Secondary | ICD-10-CM | POA: Diagnosis not present

## 2020-06-24 DIAGNOSIS — Z79899 Other long term (current) drug therapy: Secondary | ICD-10-CM | POA: Insufficient documentation

## 2020-06-24 DIAGNOSIS — J449 Chronic obstructive pulmonary disease, unspecified: Secondary | ICD-10-CM | POA: Insufficient documentation

## 2020-06-24 DIAGNOSIS — Z7982 Long term (current) use of aspirin: Secondary | ICD-10-CM | POA: Insufficient documentation

## 2020-06-24 DIAGNOSIS — J45909 Unspecified asthma, uncomplicated: Secondary | ICD-10-CM | POA: Diagnosis not present

## 2020-06-24 DIAGNOSIS — I25110 Atherosclerotic heart disease of native coronary artery with unstable angina pectoris: Secondary | ICD-10-CM | POA: Diagnosis not present

## 2020-06-24 DIAGNOSIS — R059 Cough, unspecified: Secondary | ICD-10-CM | POA: Insufficient documentation

## 2020-06-24 DIAGNOSIS — R0789 Other chest pain: Secondary | ICD-10-CM | POA: Insufficient documentation

## 2020-06-24 LAB — CBC WITH DIFFERENTIAL/PLATELET
Abs Immature Granulocytes: 0.01 10*3/uL (ref 0.00–0.07)
Basophils Absolute: 0.1 10*3/uL (ref 0.0–0.1)
Basophils Relative: 1 %
Eosinophils Absolute: 0.1 10*3/uL (ref 0.0–0.5)
Eosinophils Relative: 1 %
HCT: 40.4 % (ref 36.0–46.0)
Hemoglobin: 13.1 g/dL (ref 12.0–15.0)
Immature Granulocytes: 0 %
Lymphocytes Relative: 17 %
Lymphs Abs: 1.1 10*3/uL (ref 0.7–4.0)
MCH: 31.3 pg (ref 26.0–34.0)
MCHC: 32.4 g/dL (ref 30.0–36.0)
MCV: 96.7 fL (ref 80.0–100.0)
Monocytes Absolute: 0.8 10*3/uL (ref 0.1–1.0)
Monocytes Relative: 13 %
Neutro Abs: 4.2 10*3/uL (ref 1.7–7.7)
Neutrophils Relative %: 68 %
Platelets: 318 10*3/uL (ref 150–400)
RBC: 4.18 MIL/uL (ref 3.87–5.11)
RDW: 13.2 % (ref 11.5–15.5)
WBC: 6.2 10*3/uL (ref 4.0–10.5)
nRBC: 0 % (ref 0.0–0.2)

## 2020-06-24 LAB — COMPREHENSIVE METABOLIC PANEL
ALT: 16 U/L (ref 0–44)
AST: 25 U/L (ref 15–41)
Albumin: 3.8 g/dL (ref 3.5–5.0)
Alkaline Phosphatase: 52 U/L (ref 38–126)
Anion gap: 7 (ref 5–15)
BUN: 13 mg/dL (ref 8–23)
CO2: 28 mmol/L (ref 22–32)
Calcium: 8.7 mg/dL — ABNORMAL LOW (ref 8.9–10.3)
Chloride: 100 mmol/L (ref 98–111)
Creatinine, Ser: 0.73 mg/dL (ref 0.44–1.00)
GFR, Estimated: >60 mL/min (ref >60)
Glucose, Bld: 85 mg/dL (ref 70–99)
Potassium: 4.1 mmol/L (ref 3.5–5.1)
Sodium: 135 mmol/L (ref 135–145)
Total Bilirubin: 0.5 mg/dL (ref 0.3–1.2)
Total Protein: 6.5 g/dL (ref 6.5–8.1)

## 2020-06-24 LAB — URINALYSIS, ROUTINE W REFLEX MICROSCOPIC
Bilirubin Urine: NEGATIVE
Glucose, UA: NEGATIVE mg/dL
Hgb urine dipstick: NEGATIVE
Ketones, ur: NEGATIVE mg/dL
Leukocytes,Ua: NEGATIVE
Nitrite: NEGATIVE
Protein, ur: NEGATIVE mg/dL
Specific Gravity, Urine: 1.006 (ref 1.005–1.030)
pH: 7 (ref 5.0–8.0)

## 2020-06-24 LAB — TROPONIN I (HIGH SENSITIVITY)
Troponin I (High Sensitivity): 4 ng/L (ref <18)
Troponin I (High Sensitivity): 3 ng/L (ref <18)

## 2020-06-24 MED ORDER — ISOSORBIDE MONONITRATE ER 30 MG PO TB24: 30.0000 mg | ORAL_TABLET | Freq: Every day | ORAL | 0 refills | Status: DC

## 2020-06-24 NOTE — Discharge Instructions (Signed)
You have been seen and discharged from the emergency department.  Your cardiac work-up was negative.  I contacted the on-call cardiologist for [facility].  They recommend adding Imdur to your regimen.  Take as directed.  They want to follow-up with you in the office next week, they should give you a call but I have attached their contact information.  If you not hear from them please call to schedule a follow-up appointment.  Take home medications as prescribed. If you have any worsening symptoms or further concerns for your health please return to an emergency department for further evaluation.

## 2020-06-24 NOTE — ED Triage Notes (Signed)
Hx of COPD, hx of Angina, has chronic cough, pt states she has a blockage in her LAD, smoker - ppd for 50 years

## 2020-06-24 NOTE — ED Notes (Signed)
Not able to obtain EMS-E signature, Electronic pad not working, this issue has been previously reported

## 2020-06-24 NOTE — ED Notes (Signed)
Pt requested to ambulate to BR, ambulated with no difficulty, she states upon returning that she is now experiencing slight chest pressure of a 1 out of 10, placed back on monitor, no ectopy

## 2020-06-24 NOTE — ED Triage Notes (Signed)
See chief complaint 

## 2020-06-24 NOTE — ED Provider Notes (Signed)
[facility]   CSN: [id number] Arrival date & time: 06/24/20  [id number]     History Chief Complaint  Patient presents with  . Chest Pain    CP since 0815, called EMS at 0845, no n/v, no radiation, no diaphoresis, pt very hoh, hx of angina, took 3 ntg prior to arrival and one 325mg  asa, pain then resolved, but returned when pulling into hospital, pain level now zero again, pain comes and goes    Michelle Hubbard is a 75 y.o. female.  HPI   75 year old female with past medical history of COPD, everyday smoker, HTN, HLD, known CAD from a CT angio score presents the emergency department left-sided chest heaviness.  Patient states she woke up this morning in her baseline health, ate breakfast.  Following that she developed left-sided chest heaviness.  She took up to 3 nitroglycerin and this discomfort resolved.  She received aspirin by EMS.  On arrival here when she got up to use the bathroom she had the same left-sided chest heaviness however resolved with rest.  [facility]  She has a baseline productive cough but denies any new symptoms including shortness of breath, swelling of her lower extremities, fever.  She is otherwise been in her usual state of health and compliant with medications.  She also endorses generalized weakness and fatigue for the past week.  No active chest pain or heaviness.  Past Medical History:  Diagnosis Date  . 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 COPD 01/08/2010  . DEPRESSIVE DISORDER 12/23/2006  . FATIGUE 01/08/2010  . HYPERLIPIDEMIA 01/08/2010  . LOW BACK PAIN 12/23/2006  . OAB (overactive bladder) 10/07/2011  . OSTEOPOROSIS 01/08/2010  . 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 PONV (postoperative nausea and vomiting)   . SINUSITIS- ACUTE-NOS 07/09/2007  . Urticaria   . Vitamin D deficiency 04/30/2010    Patient Active Problem List   Diagnosis Date Noted  .  Coronary artery disease involving native coronary artery of native heart with angina pectoris (HCC) 04/15/2020  . Atherosclerosis of aorta (HCC) 04/15/2020  . Coronary artery calcification seen on CT scan 08/16/2019  . Hyperglycemia 08/12/2018  . Degenerative arthritis of right knee 08/24/2017  . Herpes zoster without complication 12/26/2016  . Pulmonary nodules 01/15/2016  . 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 Adverse food reaction 01/15/2016  . CAP (community acquired pneumonia) 11/28/2015  . COPD exacerbation (HCC) 11/28/2015  . 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/28/2015  . Dysuria 07/04/2015  . Dry mouth 12/28/2014  . Bilateral hearing loss 12/20/2013  . 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  . OAB (overactive bladder) 10/07/2011  . Preventative health care 10/07/2011  . Smoker 10/07/2011  . Right knee pain 08/15/2011  . Chronic insomnia 08/15/2011  . Vitamin D deficiency 04/30/2010  . Hyperlipidemia LDL goal <70 01/08/2010  . 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 COPD (chronic obstructive pulmonary disease) (HCC) 01/08/2010  . Osteoporosis 01/08/2010  . Fatigue 01/08/2010  . Depression 12/23/2006  . LOW BACK PAIN 12/23/2006    Past Surgical History:  Procedure Laterality Date  . ABDOMINAL HYSTERECTOMY    . APPENDECTOMY  1988  . BREAST BIOPSY  1986  . COLON RESECTION N/A 06/03/2013   Procedure: LAPAROSCOPY DIAGNOSTIC and OPEN  SIGMOID COLON RESECTION ;  Surgeon: Axel FillerArmando Ramirez, MD;  Location: [facility];  Service: General;  Laterality: N/A;  . COLON  SURGERY  2000   colon obstruction  . larygenscopy    . OOPHORECTOMY  bilat 1988   secondary to cyst     OB History   No obstetric history on file.     Family History  Problem Relation Age of Onset  . Colon cancer Mother   . Colon cancer Brother   . Diabetes Maternal Aunt   . Diabetes Maternal Grandmother   . Allergic rhinitis Neg Hx   . Angioedema Neg Hx   .  Asthma Neg Hx   . Eczema Neg Hx   . Immunodeficiency Neg Hx   . Urticaria Neg Hx     Social History   Tobacco Use  . Smoking status: Current Every Day Smoker    Packs/day: 1.00    Years: 44.00    Pack years: 44.00    Types: Cigarettes  . Smokeless tobacco: Never Used  . Tobacco comment: smoking more due to move/ trying to quit  Vaping Use  . Vaping Use: Never used  Substance Use Topics  . Alcohol use: Yes    Alcohol/week: 0.0 standard drinks    Comment: vodka nightly  . Drug use: No    Home Medications Prior to Admission medications   Medication Sig Start Date End Date Taking? Authorizing Provider  amLODipine (NORVASC) 5 MG tablet Take 1 tablet (5 mg total) by mouth daily. 04/13/20 07/12/20  Croitoru, Mihai, MD  amoxicillin (AMOXIL) 500 MG capsule Take 500 mg by mouth 3 (three) times daily.    [provider]  antiseptic oral rinse (BIOTENE) LIQD 15 mLs by Mouth Rinse route as needed for dry mouth.    [provider]  aspirin EC 81 MG tablet Take 1 tablet (81 mg total) by mouth daily. Swallow whole. 04/13/20   Croitoru, Mihai, MD  Cholecalciferol (VITAMIN D-3 PO) Take by mouth.    [provider]  citalopram (CELEXA) 10 MG tablet Take 1 tablet by mouth once daily 09/07/19   Corwin Levins, MD  Coenzyme Q10 (COQ10) 100 MG CAPS Take 3 capsules by mouth daily. 04/13/20   Croitoru, Mihai, MD  levocetirizine (XYZAL) 5 MG tablet Take 5 mg by mouth every evening.    [provider]  metoprolol succinate (TOPROL-XL) 25 MG 24 hr tablet Take 0.5 tablets (12.5 mg total) by mouth daily. Take with or immediately following a meal. 04/13/20 07/12/20  Croitoru, Rachelle Hora, MD  nitroGLYCERIN (NITROSTAT) 0.4 MG SL tablet Place 1 tablet (0.4 mg total) under the tongue every 5 (five) minutes as needed for chest pain. 10/06/19 01/04/20  Croitoru, Mihai, MD  omeprazole (PRILOSEC) 10 MG capsule Take 10 mg by mouth as needed.    [provider]  polyethylene glycol  (MIRALAX / GLYCOLAX) 17 g packet Take 17 g by mouth daily.    [provider]  rosuvastatin (CRESTOR) 20 MG tablet Take 1 tablet (20 mg total) by mouth daily. 04/13/20   Croitoru, Mihai, MD  Saline 0.65 % (Soln) SOLN Place 1 spray into the nose as needed.    [provider]  temazepam (RESTORIL) 15 MG capsule 1-2 tab by mouth at bedtime as needed for sleep 03/22/20   Corwin Levins, MD    Allergies    Codeine, Lortab [hydrocodone-acetaminophen], and Penicillins  Review of Systems   Review of Systems  Constitutional: Positive for fatigue. Negative for chills and fever.  HENT: Negative for congestion.   Eyes: Negative for visual disturbance.  Respiratory: Positive for chest tightness. Negative for  shortness of breath.   Cardiovascular: Positive for chest pain.  Gastrointestinal: Negative for abdominal pain, diarrhea and vomiting.  Genitourinary: Negative for dysuria.  Skin: Negative for rash.  Neurological: Negative for headaches.    Physical Exam Updated Vital Signs BP 122/66   Pulse 78   Temp 98.8 F (37.1 C) (Oral)   Resp 18   Ht 5\' 5"  (1.651 m)   Wt 60.2 kg   SpO2 95%   BMI 22.09 kg/m   Physical Exam Vitals and nursing note reviewed.  Constitutional:      General: She is not in acute distress.    Appearance: Normal appearance. She is not ill-appearing, toxic-appearing or diaphoretic.  HENT:     Head: Normocephalic.     Mouth/Throat:     Mouth: Mucous membranes are moist.  Cardiovascular:     Rate and Rhythm: Normal rate.  Pulmonary:     Effort: Pulmonary effort is normal. No respiratory distress.     Breath sounds: Examination of the right-lower field reveals decreased breath sounds. Examination of the left-lower field reveals decreased breath sounds. Decreased breath sounds present.  Abdominal:     Palpations: Abdomen is soft.     Tenderness: There is no abdominal tenderness.  Musculoskeletal:     Right lower leg: Edema present.     Left lower  leg: Edema present.  Skin:    General: Skin is warm.  Neurological:     Mental Status: She is alert and oriented to person, place, and time. Mental status is at baseline.  Psychiatric:        Mood and Affect: Mood normal.     ED Results / Procedures / Treatments   Labs (all labs ordered are listed, but only abnormal results are displayed) Labs Reviewed  CBC WITH DIFFERENTIAL/PLATELET  COMPREHENSIVE METABOLIC PANEL  URINALYSIS, ROUTINE W REFLEX MICROSCOPIC  TROPONIN I (HIGH SENSITIVITY)    EKG None  Radiology No results found.  Procedures Procedures   Medications Ordered in ED Medications - No data to display  ED Course  I have reviewed the triage vital signs and the nursing notes.  Pertinent labs & imaging results that were available during my care of the patient were reviewed by me and considered in my medical decision making (see chart for details).    MDM Rules/Calculators/A&P                          75 year old female presents the emergency department with chest heaviness, initial episode was resolved with nitroglycerin, she had a second brief episode here that was exertional and resolved with rest.  Currently she is chest pain-free.  Denies any shortness of breath.  Vitals are stable.  EKG shows no ischemic changes when compared to previous.  She follows with [facility] for cardiac care. No SOB, tachycardia or hypoxia, low suspicion for PE.  No radiation to her back, pain has resolved, low suspicion for dissection.  Work-up is reassuring, first troponin is negative.  Spoke with the on-call doctor for [facility], she has reviewed her cardiac CT scan, looks like all of her cardiac disease is distal.  She recommends if the second troponin is negative to add Imdur to her regimen and they will follow-up with her in the office early this week.  Repeat troponin is negative with no delta.  Patient has continued to be symptom-free here in the department.  I have sent a prescription  for Imdur, explained the  results to the patient and her daughter.  Patient will be discharged and treated as an outpatient.  Discharge plan and strict return to ED precautions discussed, patient verbalizes understanding and agreement.  Final Clinical Impression(s) / ED Diagnoses Final diagnoses:  None    Rx / DC Orders ED Discharge Orders    None       Rozelle Logan, DO 06/24/20 1511

## 2020-06-27 DIAGNOSIS — I10 Essential (primary) hypertension: Secondary | ICD-10-CM | POA: Insufficient documentation

## 2020-06-27 DIAGNOSIS — F419 Anxiety disorder, unspecified: Secondary | ICD-10-CM | POA: Insufficient documentation

## 2020-06-27 NOTE — Progress Notes (Signed)
Cardiology Clinic Note   Patient Name: Michelle Hubbard Date of Encounter: 06/29/2020  Primary Care Provider:  Corwin Levins, MD Primary Cardiologist:  Thurmon Fair, MD  Patient Profile    Michelle Hubbard 75 year old female presents the clinic today for follow-up evaluation of her coronary artery disease and chest pain.   Past Medical History    Past Medical History:  Diagnosis Date  . 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 COPD 01/08/2010  . DEPRESSIVE DISORDER 12/23/2006  . FATIGUE 01/08/2010  . HYPERLIPIDEMIA 01/08/2010  . LOW BACK PAIN 12/23/2006  . OAB (overactive bladder) 10/07/2011  . OSTEOPOROSIS 01/08/2010  . 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 PONV (postoperative nausea and vomiting)   . SINUSITIS- ACUTE-NOS 07/09/2007  . Urticaria   . Vitamin D deficiency 04/30/2010   Past Surgical History:  Procedure Laterality Date  . ABDOMINAL HYSTERECTOMY    . APPENDECTOMY  1988  . BREAST BIOPSY  1986  . COLON RESECTION N/A 06/03/2013   Procedure: LAPAROSCOPY DIAGNOSTIC and OPEN  SIGMOID COLON RESECTION ;  Surgeon: Axel Filler, MD;  Location: [facility];  Service: General;  Laterality: N/A;  . COLON SURGERY  2000   colon obstruction  . larygenscopy    . OOPHORECTOMY  bilat 1988   secondary to cyst    Allergies  Allergies  Allergen Reactions  . Codeine     REACTION: nausea  . Lortab [Hydrocodone-Acetaminophen] Hives  . Penicillins     History of Present Illness    Michelle Hubbard has a PMH of COPD, tobacco abuse, HTN, HLD, and coronary artery disease (noted on CT angio).  She presented to the emergency department on 06/24/2020 and was discharged on 06/26/2020.  She reported left-sided chest heaviness that came on after eating breakfast on 06/24/2020.  She took 3 nitroglycerin that did not help with her discomfort.  She was given aspirin by EMS.  In the emergency department she reported the same left-sided chest pain with getting up to use  the bathroom.  Her pain resolved with rest.  She reported a cough that was chronic in nature.  She denied new shortness of breath, lower extremity swelling, and fever.  She reported compliance with her medications.  She did note some generalized weakness and fatigue over the previous week.  She denied active chest pain and heaviness.  Her vital signs remained stable.  Her EKG showed no ischemic changes.  There was low suspicion for PE.  She was not tachycardic or hypoxic.  She had no radiation to her back and her pain resolved.  Her first troponin was negative.  Her cardiac CT was reviewed.  Recommendations from the on-call cardiologist were to add Imdur to her medication regimen and her repeat troponin was also negative.  She continued to be symptom-free.  She was discharged in stable condition.  She presents to the clinic today for follow-up evaluation states she felt woozy with 30 mg of Imdur daily.  We discussed taking 15 mg daily.  She reports that when she was taking medication she did not notice any chest discomfort.  We reviewed her coronary CTA and FFR.  She expressed understanding.  She reports that she also notices chest discomfort when she smokes.  I strongly encouraged her to change her habits and stop smoking.  We reviewed strategies.  We reviewed her cholesterol panel and the importance of high-fiber diet.  I will refill her Imdur prescription, give her smoking cessation information, give  her the salty 6 diet sheet, and have her follow-up in 4 months.  Today she denies chest pain, shortness of breath, lower extremity edema, fatigue, palpitations, melena, hematuria, hemoptysis, diaphoresis, weakness, presyncope, syncope, orthopnea, and PND.   Home Medications    Prior to Admission medications   Medication Sig Start Date End Date Taking? Authorizing Provider  amLODipine (NORVASC) 5 MG tablet Take 1 tablet (5 mg total) by mouth daily. 04/13/20 07/12/20  Croitoru, Mihai, MD  amoxicillin (AMOXIL)  500 MG capsule Take 500 mg by mouth 3 (three) times daily.    [provider]  antiseptic oral rinse (BIOTENE) LIQD 15 mLs by Mouth Rinse route as needed for dry mouth.    [provider]  aspirin EC 81 MG tablet Take 1 tablet (81 mg total) by mouth daily. Swallow whole. 04/13/20   Croitoru, Mihai, MD  Cholecalciferol (VITAMIN D-3 PO) Take by mouth.    [provider]  citalopram (CELEXA) 10 MG tablet Take 1 tablet by mouth once daily 09/07/19   Corwin Levins, MD  Coenzyme Q10 (COQ10) 100 MG CAPS Take 3 capsules by mouth daily. 04/13/20   Croitoru, Mihai, MD  isosorbide mononitrate (IMDUR) 30 MG 24 hr tablet Take 1 tablet (30 mg total) by mouth daily. 06/24/20   Horton, Clabe Seal, DO  levocetirizine (XYZAL) 5 MG tablet Take 5 mg by mouth every evening.    [provider]  metoprolol succinate (TOPROL-XL) 25 MG 24 hr tablet Take 0.5 tablets (12.5 mg total) by mouth daily. Take with or immediately following a meal. 04/13/20 07/12/20  Croitoru, Rachelle Hora, MD  nitroGLYCERIN (NITROSTAT) 0.4 MG SL tablet Place 1 tablet (0.4 mg total) under the tongue every 5 (five) minutes as needed for chest pain. 10/06/19 01/04/20  Croitoru, Mihai, MD  omeprazole (PRILOSEC) 10 MG capsule Take 10 mg by mouth as needed.    [provider]  polyethylene glycol (MIRALAX / GLYCOLAX) 17 g packet Take 17 g by mouth daily.    [provider]  rosuvastatin (CRESTOR) 20 MG tablet Take 1 tablet (20 mg total) by mouth daily. 04/13/20   Croitoru, Mihai, MD  Saline 0.65 % (Soln) SOLN Place 1 spray into the nose as needed.    [provider]  temazepam (RESTORIL) 15 MG capsule 1-2 tab by mouth at bedtime as needed for sleep 03/22/20   Corwin Levins, MD    Family History    Family History  Problem Relation Age of Onset  . Colon cancer Mother   . Colon cancer Brother   . Diabetes Maternal Aunt   . Diabetes Maternal Grandmother   . Allergic rhinitis Neg Hx   . Angioedema Neg Hx    . Asthma Neg Hx   . Eczema Neg Hx   . Immunodeficiency Neg Hx   . Urticaria Neg Hx    She indicated that the status of her mother is unknown. She indicated that the status of her brother is unknown. She indicated that the status of her maternal grandmother is unknown. She indicated that the status of her maternal aunt is unknown. She indicated that the status of her neg hx is unknown.  Social History    Social History   Socioeconomic History  . Marital status: Widowed    Spouse name: Not on file  . Number of children: 3  . Years of education: Not on file  . Highest education level: Not on file  Occupational History  . Occupation: Retired  Employer: [employer]  Tobacco Use  . Smoking status: Current Every Day Smoker    Packs/day: 1.00    Years: 44.00    Pack years: 44.00    Types: Cigarettes  . Smokeless tobacco: Never Used  . Tobacco comment: smoking more due to move/ trying to quit  Vaping Use  . Vaping Use: Never used  Substance and Sexual Activity  . Alcohol use: Yes    Alcohol/week: 0.0 standard drinks    Comment: vodka nightly  . Drug use: No  . Sexual activity: Not Currently  Other Topics Concern  . Not on file  Social History Narrative  . Not on file   Social Determinants of Health   Financial Resource Strain: Low Risk   . Difficulty of Paying Living Expenses: Not hard at all  Food Insecurity: No Food Insecurity  . Worried About Programme researcher, broadcasting/film/video in the Last Year: Never true  . Ran Out of Food in the Last Year: Never true  Transportation Needs: No Transportation Needs  . Lack of Transportation (Medical): No  . Lack of Transportation (Non-Medical): No  Physical Activity: Sufficiently Active  . Days of Exercise per Week: 5 days  . Minutes of Exercise per Session: 30 min  Stress: No Stress Concern Present  . Feeling of Stress : Not at all  Social Connections: Moderately Integrated  . Frequency of Communication with Friends and Family: More  than three times a week  . Frequency of Social Gatherings with Friends and Family: More than three times a week  . Attends Religious Services: More than 4 times per year  . Active Member of Clubs or Organizations: Yes  . Attends Banker Meetings: More than 4 times per year  . Marital Status: Widowed  Intimate Partner Violence: Not At Risk  . Fear of Current or Ex-Partner: No  . Emotionally Abused: No  . Physically Abused: No  . Sexually Abused: No     Review of Systems    General:  No chills, fever, night sweats or weight changes.  Cardiovascular:  No chest pain, dyspnea on exertion, edema, orthopnea, palpitations, paroxysmal nocturnal dyspnea. Dermatological: No rash, lesions/masses Respiratory: No cough, dyspnea Urologic: No hematuria, dysuria Abdominal:   No nausea, vomiting, diarrhea, bright red blood per rectum, melena, or hematemesis Neurologic:  No visual changes, wkns, changes in mental status. All other systems reviewed and are otherwise negative except as noted above.  Physical Exam    VS:  BP (!) 144/78   Pulse 88   Ht 5\' 5"  (1.651 m)   Wt 133 lb 3.2 oz (60.4 kg)   SpO2 94%   BMI 22.17 kg/m  , BMI Body mass index is 22.17 kg/m. GEN: Well nourished, well developed, in no acute distress. HEENT: normal. Neck: Supple, no JVD, carotid bruits, or masses. Cardiac: RRR, no murmurs, rubs, or gallops. No clubbing, cyanosis, edema.  Radials/DP/PT 2+ and equal bilaterally.  Respiratory:  Respirations regular and unlabored, clear to auscultation bilaterally. GI: Soft, nontender, nondistended, BS + x 4. MS: no deformity or atrophy. Skin: warm and dry, no rash. Neuro:  Strength and sensation are intact. Psych: Normal affect.  Accessory Clinical Findings    Recent Labs: 08/16/2019: TSH 1.53 06/24/2020: ALT 16; BUN 13; Creatinine, Ser 0.73; Hemoglobin 13.1; Platelets 318; Potassium 4.1; Sodium 135   Recent Lipid Panel    Component Value Date/Time   CHOL 129  06/05/2020 0835   TRIG 62 06/05/2020 0835   HDL 62  06/05/2020 0835   CHOLHDL 2.1 06/05/2020 0835   CHOLHDL 3.1 08/16/2019 1518   VLDL 22.6 08/12/2018 1457   LDLCALC 54 06/05/2020 0835   LDLCALC 119 (H) 08/16/2019 1518   LDLDIRECT 146.3 10/07/2011 0919    ECG personally reviewed by me today-none today.  Coronary CTA 10/24/2019 Aorta:  Normal size.  Aortic atherosclerosis noted.  No dissection.  Aortic Valve:  Trileaflet.  No calcifications.  Coronary Arteries:  Normal coronary origin.  Right dominance.  RCA is a large dominant artery that gives rise to RV branch and PDA. There is mild (25-49%) calcified stenoses in the proximal and mid vessel.  Left main is a large artery that gives rise to LAD and LCX arteries. There is mild (25-49%) calcified stenosis in the distal LM extending into the proximal LAD.  LAD is a large vessel that gives rise to 3 small diagonals. There is minimal (0-24%) calcified stenoses in the proximal and mid vessel followed by moderate (50-69%) calcified stenosis in the mid vessel; there is moderate (50-69%) mixed plaque stenosis in the mid to distal vessel.  LCX is a non-dominant artery that gives rise to OM branch. There is minimal (0-24%) calcified plaque in the proximal vessel, minimal (0-24%) calcified plaque prior to OM; mild (25-49%) calcified stenosis after OM takeoff followed by minimal (0-24%) calcified plaque.  Other findings:  Normal pulmonary vein drainage into the left atrium.  Normal let atrial appendage without a thrombus.  Normal size of the pulmonary artery.   IMPRESSION:  1. Coronary calcium score of 675. This was 90 percentile for age and sex matched control.  2. Normal coronary origin with right dominance.  3. CAD with most significant being moderate (50-69%) stenoses in the mid LAD and mid to distal LAD; CAD-RADS 3; study will be sent for FFR. IMPRESSION: 1.  CT FFR analysis didn't show any significant  stenosis.  1. LM: findings 1.00, 0.99  2. LAD: findings 0.98, 0.92 0.83  3. LCX: findings 0.98, 0.96 0.73  4. OM: findings 0.97, 0.95  5. RCA: findings 0.97, 0.92 0.91  FFR consistent with significant stenosis in distal LCX 0.73; however vessel is small distally and medical therapy likely best option.  Note: These examples are not recommendations of [employer] and only provided as examples of what other customers are doing.  Electronically Signed: By: Olga Millers M.D. Assessment & Plan   1.  Chest discomfort- presented to emergency department on 06/24/2020 and was discharged on 06/26/2020.  High-sensitivity troponins were negative.  No EKG changes.  Patient started on Imdur and discharged in stable condition.  Blood pressure at home well controlled 110-120/70-80 Continue, amlodipine  Refill/reduce Imdur 15 mg daily Heart healthy low-sodium diet-salty 6 given Increase physical activity as tolerated  Coronary artery disease- no chest pain today.  Coronary CTA showed moderate 50 to 69% stenosis of mid LAD and mid-distal LAD. FFR showed no significant stenosis. Continue amlodipine, Imdur, aspirin, metoprolol, rosuvastatin Heart healthy low-sodium diet-salty 6 given Increase physical activity as tolerated  Hyperlipidemia-06/05/2020: Cholesterol, Total 129; HDL 62; LDL Chol Calc (NIH) 54; Triglycerides 62 Continue rosuvastatin, aspirin, co-Q10 Heart healthy low-sodium high-fiber diet-discussed Increase physical activity as tolerated  GERD-reports compliance with Prilosec.  Appears that her most recent episode/visit to the ED may be related to reflux. Follows with PCP GERD diet information given  Tobacco abuse- smoking cessation discussed and strongly encouraged. Smoking cessation information given  Disposition: Follow-up with Dr. Royann Shivers in 3-4 months.      Thomasene Ripple.  Serapio Edelson NP-C    06/29/2020, 10:06 AM [facility] [address] Office [phone] Fax [phone]  Notice: This dictation was prepared with Dragon dictation along with smaller phrase technology. Any transcriptional errors that result from this process are unintentional and may not be corrected upon review.  I spent 15 minutes examining this patient, reviewing medications, and using patient centered shared decision making involving her cardiac care.  Prior to her visit I spent greater than 20 minutes reviewing her past medical history,  medications, and prior cardiac tests.

## 2020-06-29 ENCOUNTER — Encounter: Payer: Self-pay | Admitting: General Practice

## 2020-06-29 ENCOUNTER — Ambulatory Visit (INDEPENDENT_AMBULATORY_CARE_PROVIDER_SITE_OTHER): Payer: Medicare PPO | Admitting: General Practice

## 2020-06-29 ENCOUNTER — Other Ambulatory Visit: Payer: Self-pay

## 2020-06-29 VITALS — BP 144/78 | HR 88 | Ht 65.0 in | Wt 133.2 lb

## 2020-06-29 DIAGNOSIS — K219 Gastro-esophageal reflux disease without esophagitis: Secondary | ICD-10-CM

## 2020-06-29 DIAGNOSIS — I2510 Atherosclerotic heart disease of native coronary artery without angina pectoris: Secondary | ICD-10-CM | POA: Diagnosis not present

## 2020-06-29 DIAGNOSIS — Z720 Tobacco use: Secondary | ICD-10-CM

## 2020-06-29 DIAGNOSIS — R0789 Other chest pain: Secondary | ICD-10-CM

## 2020-06-29 DIAGNOSIS — E785 Hyperlipidemia, unspecified: Secondary | ICD-10-CM

## 2020-06-29 MED ORDER — ISOSORBIDE MONONITRATE ER 30 MG PO TB24: 15.0000 mg | ORAL_TABLET | Freq: Every day | ORAL | 6 refills | Status: DC

## 2020-06-29 NOTE — Patient Instructions (Signed)
Medication Instructions:  DECREASE IMDUR 15MG  DAILY *If you need a refill on your cardiac medications before your next appointment, please call your pharmacy*  Lab Work:   Testing/Procedures:  NONE    NONE  Special Instructions PLEASE READ AND FOLLOW CESSATION TIPS-ATTACHED CHANGE YOUR SMOKING HABITS AND CHEW SUGAR-FREE GUM  PLEASE READ AND FOLLOW SALTY 6-ATTACHED-1,800mg  daily  PLEASE READ AND FOLLOW INCREASED FIBER DIET-ATTACHED  Follow-Up: Your next appointment:  4 month(s) In Person with , MD ONLY  At [facility], you and your health needs are our priority.  As part of our continuing mission to provide you with exceptional heart care, we have created designated Provider Care Teams.  These Care Teams include your primary Cardiologist (physician) and Advanced Practice Providers (APPs -  Physician Assistants and Nurse Practitioners) who all work together to provide you with the care you need, when you need it.            6 SALTY THINGS TO AVOID     1,800MG  DAILY    Steps to Quit Smoking Smoking tobacco is the leading cause of preventable death. It can affect almost every organ in the body. Smoking puts you and people around you at risk for many serious, long-lasting (chronic) diseases. Quitting smoking can be hard, but it is one of the best things that you can do for your health. It is never too late to quit. How do I get ready to quit? When you decide to quit smoking, make a plan to help you succeed. Before you quit:  Pick a date to quit. Set a date within the next 2 weeks to give you time to prepare.  Write down the reasons why you are quitting. Keep this list in places where you will see it often.  Tell your family, friends, and co-workers that you are quitting. Their support is important.  Talk with your doctor about the choices that may help you quit.  Find out if your health insurance will pay for these treatments.  Know the people, places, things, and  activities that make you want to smoke (triggers). Avoid them. What first steps can I take to quit smoking?  Throw away all cigarettes at home, at work, and in your car.  Throw away the things that you use when you smoke, such as ashtrays and lighters.  Clean your car. Make sure to empty the ashtray.  Clean your home, including curtains and carpets. What can I do to help me quit smoking? Talk with your doctor about taking medicines and seeing a counselor at the same time. You are more likely to succeed when you do both.  If you are pregnant or breastfeeding, talk with your doctor about counseling or other ways to quit smoking. Do not take medicine to help you quit smoking unless your doctor tells you to do so. To quit smoking: Quit right away  Quit smoking totally, instead of slowly cutting back on how much you smoke over a period of time.  Go to counseling. You are more likely to quit if you go to counseling sessions regularly. Take medicine You may take medicines to help you quit. Some medicines need a prescription, and some you can buy over-the-counter. Some medicines may contain a drug called nicotine to replace the nicotine in cigarettes. Medicines may:  Help you to stop having the desire to smoke (cravings).  Help to stop the problems that come when you stop smoking (withdrawal symptoms). Your doctor may ask you to  use:  Nicotine patches, gum, or lozenges.  Nicotine inhalers or sprays.  Non-nicotine medicine that is taken by mouth. Find resources Find resources and other ways to help you quit smoking and remain smoke-free after you quit. These resources are most helpful when you use them often. They include:  Online chats with a Veterinary surgeoncounselor.  Phone quitlines.  Printed Materials engineerself-help materials.  Support groups or group counseling.  Text messaging programs.  Mobile phone apps. Use apps on your mobile phone or tablet that can help you stick to your quit plan. There are many  free apps for mobile phones and tablets as well as websites. Examples include Quit Guide from the [employer] and smokefree.gov   What things can I do to make it easier to quit?  Talk to your family and friends. Ask them to support and encourage you.  Call a phone quitline (1-800-QUIT-NOW), reach out to support groups, or work with a Veterinary surgeoncounselor.  Ask people who smoke to not smoke around you.  Avoid places that make you want to smoke, such as: ? Bars. ? Parties. ? Smoke-break areas at work.  Spend time with people who do not smoke.  Lower the stress in your life. Stress can make you want to smoke. Try these things to help your stress: ? Getting regular exercise. ? Doing deep-breathing exercises. ? Doing yoga. ? Meditating. ? Doing a body scan. To do this, close your eyes, focus on one area of your body at a time from head to toe. Notice which parts of your body are tense. Try to relax the muscles in those areas.   How will I feel when I quit smoking? Day 1 to 3 weeks Within the first 24 hours, you may start to have some problems that come from quitting tobacco. These problems are very bad 2-3 days after you quit, but they do not often last for more than 2-3 weeks. You may get these symptoms:  Mood swings.  Feeling restless, nervous, angry, or annoyed.  Trouble concentrating.  Dizziness.  Strong desire for high-sugar foods and nicotine.  Weight gain.  Trouble pooping (constipation).  Feeling like you may vomit (nausea).  Coughing or a sore throat.  Changes in how the medicines that you take for other issues work in your body.  Depression.  Trouble sleeping (insomnia). Week 3 and afterward After the first 2-3 weeks of quitting, you may start to notice more positive results, such as:  Better sense of smell and taste.  Less coughing and sore throat.  Slower heart rate.  Lower blood pressure.  Clearer skin.  Better breathing.  Fewer sick days. Quitting smoking can be  hard. Do not give up if you fail the first time. Some people need to try a few times before they succeed. Do your best to stick to your quit plan, and talk with your doctor if you have any questions or concerns. Summary  Smoking tobacco is the leading cause of preventable death. Quitting smoking can be hard, but it is one of the best things that you can do for your health.  When you decide to quit smoking, make a plan to help you succeed.  Quit smoking right away, not slowly over a period of time.  When you start quitting, seek help from your doctor, family, or friends. This information is not intended to replace advice given to you by your health care provider. Make sure you discuss any questions you have with your health care provider. Document Revised: 10/08/2018  Document Reviewed: 04/03/2018 Elsevier Patient Education  2021 Elsevier Inc.  High-Fiber Eating Plan Fiber, also called dietary fiber, is a type of carbohydrate. It is found foods such as fruits, vegetables, whole grains, and beans. A high-fiber diet can have many health benefits. Your health care provider may recommend a high-fiber diet to help:  Prevent constipation. Fiber can make your bowel movements more regular.  Lower your cholesterol.  Relieve the following conditions: ? Inflammation of veins in the anus (hemorrhoids). ? Inflammation of specific areas of the digestive tract (uncomplicated diverticulosis). ? A problem of the large intestine, also called the colon, that sometimes causes pain and diarrhea (irritable bowel syndrome, or IBS).  Prevent overeating as part of a weight-loss plan.  Prevent heart disease, type 2 diabetes, and certain cancers. What are tips for following this plan? Reading food labels  Check the nutrition facts label on food products for the amount of dietary fiber. Choose foods that have 5 grams of fiber or more per serving.  The goals for recommended daily fiber intake include: ? Men  (age 53 or younger): 34-38 g. ? Men (over age 9): 28-34 g. ? Women (age 66 or younger): 25-28 g. ? Women (over age 66): 22-25 g. Your daily fiber goal is _____________ g.   Shopping  Choose whole fruits and vegetables instead of processed forms, such as apple juice or applesauce.  Choose a wide variety of high-fiber foods such as avocados, lentils, oats, and kidney beans.  Read the nutrition facts label of the foods you choose. Be aware of foods with added fiber. These foods often have high sugar and sodium amounts per serving. Cooking  Use whole-grain flour for baking and cooking.  Cook with brown rice instead of white rice. Meal planning  Start the day with a breakfast that is high in fiber, such as a cereal that contains 5 g of fiber or more per serving.  Eat breads and cereals that are made with whole-grain flour instead of refined flour or white flour.  Eat brown rice, bulgur wheat, or millet instead of white rice.  Use beans in place of meat in soups, salads, and pasta dishes.  Be sure that half of the grains you eat each day are whole grains. General information  You can get the recommended daily intake of dietary fiber by: ? Eating a variety of fruits, vegetables, grains, nuts, and beans. ? Taking a fiber supplement if you are not able to take in enough fiber in your diet. It is better to get fiber through food than from a supplement.  Gradually increase how much fiber you consume. If you increase your intake of dietary fiber too quickly, you may have bloating, cramping, or gas.  Drink plenty of water to help you digest fiber.  Choose high-fiber snacks, such as berries, raw vegetables, nuts, and popcorn. What foods should I eat? Fruits Berries. Pears. Apples. Oranges. Avocado. Prunes and raisins. Dried figs. Vegetables Sweet potatoes. Spinach. Kale. Artichokes. Cabbage. Broccoli. Cauliflower. Green peas. Carrots. Squash. Grains Whole-grain breads. Multigrain  cereal. Oats and oatmeal. Brown rice. Barley. Bulgur wheat. Millet. Quinoa. Bran muffins. Popcorn. Rye wafer crackers. Meats and other proteins Navy beans, kidney beans, and pinto beans. Soybeans. Split peas. Lentils. Nuts and seeds. Dairy Fiber-fortified yogurt. Beverages Fiber-fortified soy milk. Fiber-fortified orange juice. Other foods Fiber bars. The items listed above may not be a complete list of recommended foods and beverages. Contact a dietitian for more information. What foods should I avoid? Fruits Fruit  juice. Cooked, strained fruit. Vegetables Fried potatoes. Canned vegetables. Well-cooked vegetables. Grains White bread. Pasta made with refined flour. White rice. Meats and other proteins Fatty cuts of meat. Fried chicken or fried fish. Dairy Milk. Yogurt. Cream cheese. Sour cream. Fats and oils Butters. Beverages Soft drinks. Other foods Cakes and pastries. The items listed above may not be a complete list of foods and beverages to avoid. Talk with your dietitian about what choices are best for you. Summary  Fiber is a type of carbohydrate. It is found in foods such as fruits, vegetables, whole grains, and beans.  A high-fiber diet has many benefits. It can help to prevent constipation, lower blood cholesterol, aid weight loss, and reduce your risk of heart disease, diabetes, and certain cancers.  Increase your intake of fiber gradually. Increasing fiber too quickly may cause cramping, bloating, and gas. Drink plenty of water while you increase the amount of fiber you consume.  The best sources of fiber include whole fruits and vegetables, whole grains, nuts, seeds, and beans. This information is not intended to replace advice given to you by your health care provider. Make sure you discuss any questions you have with your health care provider. Document Revised: 05/19/2019 Document Reviewed: 05/19/2019 Elsevier Patient Education  2021 [employer].

## 2020-06-29 NOTE — Progress Notes (Signed)
Thanks

## 2020-08-20 ENCOUNTER — Ambulatory Visit (INDEPENDENT_AMBULATORY_CARE_PROVIDER_SITE_OTHER): Payer: Medicare PPO | Admitting: Internal Medicine

## 2020-08-20 ENCOUNTER — Ambulatory Visit (INDEPENDENT_AMBULATORY_CARE_PROVIDER_SITE_OTHER): Payer: Medicare PPO | Admitting: General Practice

## 2020-08-20 ENCOUNTER — Encounter: Payer: Self-pay | Admitting: Internal Medicine

## 2020-08-20 ENCOUNTER — Other Ambulatory Visit: Payer: Self-pay

## 2020-08-20 VITALS — BP 124/60 | HR 88 | Temp 97.8°F | Ht 65.0 in | Wt 133.0 lb

## 2020-08-20 VITALS — BP 124/60 | HR 88 | Temp 97.8°F | Ht 65.0 in | Wt 133.2 lb

## 2020-08-20 DIAGNOSIS — Z0000 Encounter for general adult medical examination without abnormal findings: Secondary | ICD-10-CM | POA: Diagnosis not present

## 2020-08-20 DIAGNOSIS — F32A Depression, unspecified: Secondary | ICD-10-CM

## 2020-08-20 DIAGNOSIS — R918 Other nonspecific abnormal finding of lung field: Secondary | ICD-10-CM

## 2020-08-20 DIAGNOSIS — E559 Vitamin D deficiency, unspecified: Secondary | ICD-10-CM | POA: Diagnosis not present

## 2020-08-20 DIAGNOSIS — E785 Hyperlipidemia, unspecified: Secondary | ICD-10-CM

## 2020-08-20 DIAGNOSIS — Z0001 Encounter for general adult medical examination with abnormal findings: Secondary | ICD-10-CM | POA: Diagnosis not present

## 2020-08-20 DIAGNOSIS — F5104 Psychophysiologic insomnia: Secondary | ICD-10-CM

## 2020-08-20 DIAGNOSIS — F17200 Nicotine dependence, unspecified, uncomplicated: Secondary | ICD-10-CM | POA: Diagnosis not present

## 2020-08-20 MED ORDER — SERTRALINE HCL 50 MG PO TABS: 50.0000 mg | ORAL_TABLET | Freq: Every day | ORAL | 3 refills | Status: DC

## 2020-08-20 MED ORDER — TEMAZEPAM 15 MG PO CAPS: ORAL_CAPSULE | ORAL | 5 refills | Status: DC

## 2020-08-20 NOTE — Assessment & Plan Note (Signed)
Lab Results  Component Value Date   LDLCALC 54 06/05/2020   Stable, pt to continue current statin crestor

## 2020-08-20 NOTE — Patient Instructions (Signed)
Please take all new medication as prescribed - the generic zoloft at 50 mg per day  Please continue all other medications as before, and refills have been done for the sleeping medicine  Please have the pharmacy call with any other refills you may need.  Please continue your efforts at being more active, low cholesterol diet, and weight control.  You are otherwise up to date with prevention measures today.  Please keep your appointments with your specialists as you may have planned  We can hold on more lab testing today  Please make an Appointment to return for your 1 year visit, or sooner if needed

## 2020-08-20 NOTE — Progress Notes (Signed)
Chief Complaint:: wellness exam and depression, insomnia       HPI:  Michelle Hubbard is a 75 y.o. female here for wellness exam; o/w up to date with preventive referrals and immunizations                        Also has mild worsening depression and insomnia recently after stopped the citalopram after optho suggested it might be responsible for any eye symptoms she had.  Ran out of temazepam.  Pt denies chest pain, increased sob or doe, wheezing, orthopnea, PND, increased LE swelling, palpitations, dizziness or syncope.   Pt denies polydipsia, polyuria, or new focal neuro s/s.  Last lipids per cardiology with excellent LDL in mar 2022. Declines other labs today.  Taking Vit D .  Did not have her most recent LDCT in feb 2022 since it was scheduled in [place] and did not want to go there. Does not want to reschedule now  Pt still smoking, not ready to quit   Wt Readings from Last 3 Encounters:  08/20/20 133 lb (60.3 kg)  08/20/20 133 lb 3.2 oz (60.4 kg)  06/29/20 133 lb 3.2 oz (60.4 kg)   BP Readings from Last 3 Encounters:  08/20/20 124/60  08/20/20 124/60  06/29/20 (!) 144/78   Immunization History  Administered Date(s) Administered   Influenza Split 10/07/2011   Influenza Whole 01/08/2010   Influenza, High Dose Seasonal PF 11/28/2014, 01/06/2018, 10/27/2019   Influenza,inj,Quad PF,6+ Mos 12/20/2013, 11/28/2015   Influenza-Unspecified 11/27/2017   PFIZER(Purple Top)SARS-COV-2 Vaccination 03/27/2019, 04/20/2019, 01/16/2020, 08/07/2020   Pneumococcal Conjugate-13 12/28/2014   Pneumococcal Polysaccharide-23 01/08/2010   Tdap 10/07/2011   Zoster Recombinat (Shingrix) 05/20/2017, 06/25/2017, 07/20/2017  There are no preventive care reminders to display for this patient.    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   COPD 01/08/2010   DEPRESSIVE DISORDER 12/23/2006   FATIGUE 01/08/2010   HYPERLIPIDEMIA 01/08/2010   LOW BACK PAIN  12/23/2006   OAB (overactive bladder) 10/07/2011   OSTEOPOROSIS 01/08/2010   Other specified forms of hearing loss 01/08/2010   PONV (postoperative nausea and vomiting)    SINUSITIS- ACUTE-NOS 07/09/2007   Urticaria    Vitamin D deficiency 04/30/2010   Past Surgical History:  Procedure Laterality Date   ABDOMINAL HYSTERECTOMY     APPENDECTOMY  1988   BREAST BIOPSY  1986   COLON RESECTION N/A 06/03/2013   Procedure: LAPAROSCOPY DIAGNOSTIC and OPEN  SIGMOID COLON RESECTION ;  Surgeon: Axel Filler, MD;  Location: [facility];  Service: General;  Laterality: N/A;   COLON SURGERY  2000   colon obstruction   larygenscopy     OOPHORECTOMY  bilat 1988   secondary to cyst    reports that she has been smoking cigarettes. She has a 44.00 pack-year smoking history. She has never used smokeless tobacco. She reports current alcohol use. She reports that she does not use drugs. family history includes Colon cancer in her brother and mother; Diabetes in her maternal aunt and maternal grandmother. Allergies  Allergen Reactions   Codeine     REACTION: nausea   Lortab [Hydrocodone-Acetaminophen] Hives   Penicillins    Current Outpatient Medications on File Prior to Visit  Medication Sig Dispense Refill   aspirin EC 81 MG tablet Take 1 tablet (81 mg total) by mouth daily. Swallow whole. 90 tablet 3   Cholecalciferol (VITAMIN D-3 PO) Take by  mouth.     Coenzyme Q10 (COQ10) 100 MG CAPS Take 3 capsules by mouth daily. 30 capsule    isosorbide mononitrate (IMDUR) 30 MG 24 hr tablet Take 0.5 tablets (15 mg total) by mouth daily. 15 tablet 6   levocetirizine (XYZAL) 5 MG tablet Take 5 mg by mouth every evening.     omeprazole (PRILOSEC) 10 MG capsule Take 10 mg by mouth as needed.     polyethylene glycol (MIRALAX / GLYCOLAX) 17 g packet Take 17 g by mouth daily.     rosuvastatin (CRESTOR) 20 MG tablet Take 1 tablet (20 mg total) by mouth daily. 90 tablet 1   Saline 0.65 % (Soln) SOLN Place 1 spray into the  nose as needed.     amLODipine (NORVASC) 5 MG tablet Take 1 tablet (5 mg total) by mouth daily. 90 tablet 3   metoprolol succinate (TOPROL-XL) 25 MG 24 hr tablet Take 0.5 tablets (12.5 mg total) by mouth daily. Take with or immediately following a meal. 45 tablet 1   nitroGLYCERIN (NITROSTAT) 0.4 MG SL tablet Place 1 tablet (0.4 mg total) under the tongue every 5 (five) minutes as needed for chest pain. 25 tablet 3   No current facility-administered medications on file prior to visit.        ROS:  All others reviewed and negative.  Objective        PE:  BP 124/60   Pulse 88   Temp 97.8 F (36.6 C) (Oral)   Ht 5\' 5"  (1.651 m)   Wt 133 lb (60.3 kg)   SpO2 91%   BMI 22.13 kg/m                 Constitutional: Pt appears in NAD               HENT: Head: NCAT.                Right Ear: External ear normal.                 Left Ear: External ear normal.                Eyes: . Pupils are equal, round, and reactive to light. Conjunctivae and EOM are normal               Nose: without d/c or deformity               Neck: Neck supple. Gross normal ROM               Cardiovascular: Normal rate and regular rhythm.                 Pulmonary/Chest: Effort normal and breath sounds without rales or wheezing.                Abd:  Soft, NT, ND, + BS, no organomegaly               Neurological: Pt is alert. At baseline orientation, motor grossly intact               Skin: Skin is warm. No rashes, no other new lesions, LE edema - none               Psychiatric: Pt behavior is normal without agitation   Micro: none  Cardiac tracings I have personally interpreted today:  none  Pertinent Radiological findings (summarize): none   Lab Results  Component Value Date  WBC 6.2 06/24/2020   HGB 13.1 06/24/2020   HCT 40.4 06/24/2020   PLT 318 06/24/2020   GLUCOSE 85 06/24/2020   CHOL 129 06/05/2020   TRIG 62 06/05/2020   HDL 62 06/05/2020   LDLDIRECT 146.3 10/07/2011   LDLCALC 54 06/05/2020    ALT 16 06/24/2020   AST 25 06/24/2020   NA 135 06/24/2020   K 4.1 06/24/2020   CL 100 06/24/2020   CREATININE 0.73 06/24/2020   BUN 13 06/24/2020   CO2 28 06/24/2020   TSH 1.53 08/16/2019   INR 0.96 05/27/2013   HGBA1C 5.5 08/16/2019   Assessment/Plan:  SMT LOKEY is a 75 y.o. White or Caucasian [1] female with  has a past medical history of Asthma-COPD overlap syndrome (HCC) (01/15/2016), COPD (01/08/2010), DEPRESSIVE DISORDER (12/23/2006), FATIGUE (01/08/2010), HYPERLIPIDEMIA (01/08/2010), LOW BACK PAIN (12/23/2006), OAB (overactive bladder) (10/07/2011), OSTEOPOROSIS (01/08/2010), Other specified forms of hearing loss (01/08/2010), PONV (postoperative nausea and vomiting), SINUSITIS- ACUTE-NOS (07/09/2007), Urticaria, and Vitamin D deficiency (04/30/2010).  Encounter for well adult exam with abnormal findings Age and sex appropriate education and counseling updated with regular exercise and diet Referrals for preventative services - none needed Immunizations addressed - none needed Smoking counseling  - counseled to quit, pt not ready Evidence for depression or other mood disorder - + mild worsening, see notes Most recent labs reviewed. I have personally reviewed and have noted: 1) the patient's medical and social history 2) The patient's current medications and supplements 3) The patient's height, weight, and BMI have been recorded in the chart   Vitamin D deficiency Last vitamin D Lab Results  Component Value Date   VD25OH 28 (L) 08/16/2019   Low, to start oral replacement    Smoker Pt counseled to quit, pt not ready  Pulmonary nodules Pt does not want to reschedule LDCT for lung cancer screening at this time  Hyperlipidemia LDL goal <70 Lab Results  Component Value Date   LDLCALC 54 06/05/2020   Stable, pt to continue current statin crestor   Depression Uncontrolled, no SI or HI, to try change to zoloft 50 qd, declines referral for counseling  Chronic  insomnia Chronic stable, ok for temazepam qhs prn,  to f/u any worsening symptoms or concerns  Followup: No follow-ups on file.  Oliver Barre, MD 08/20/2020 3:50 PM [facility]

## 2020-08-20 NOTE — Progress Notes (Addendum)
Subjective:   Michelle Hubbard is a 75 y.o. female who presents for Medicare Annual (Subsequent) preventive examination.  Review of Systems     Cardiac Risk Factors include: advanced age (>68men, >79 women);dyslipidemia;smoking/ tobacco exposure     Objective:    Today's Vitals   08/20/20 1414  BP: 124/60  Pulse: 88  Temp: 97.8 F (36.6 C)  SpO2: 91%  Weight: 133 lb 3.2 oz (60.4 kg)  Height: 5\' 5"  (1.651 m)  PainSc: 0-No pain   Body mass index is 22.17 kg/m.  Advanced Directives 08/20/2020 06/24/2020 08/16/2019 08/12/2018 07/02/2017 07/21/2016 06/19/2016  Does Patient Have a Medical Advance Directive? Yes Yes Yes Yes Yes No Yes  Type of Advance Directive Living will;Healthcare Power of Attorney Living will Healthcare Power of [place];Living will Healthcare Power of [place];Living will Healthcare Power of [place];Living will - Healthcare Power of [place];Living will  Does patient want to make changes to medical advance directive? No - Patient declined - No - Patient declined - - - -  Copy of Healthcare Power of Attorney in Chart? No - copy requested - No - copy requested No - copy requested No - copy requested - No - copy requested  Pre-existing out of facility DNR order (yellow form or pink MOST form) - - - - - - -    Current Medications (verified) Outpatient Encounter Medications as of 08/20/2020  Medication Sig   aspirin EC 81 MG tablet Take 1 tablet (81 mg total) by mouth daily. Swallow whole.   Cholecalciferol (VITAMIN D-3 PO) Take by mouth.   Coenzyme Q10 (COQ10) 100 MG CAPS Take 3 capsules by mouth daily.   isosorbide mononitrate (IMDUR) 30 MG 24 hr tablet Take 0.5 tablets (15 mg total) by mouth daily.   levocetirizine (XYZAL) 5 MG tablet Take 5 mg by mouth every evening.   omeprazole (PRILOSEC) 10 MG capsule Take 10 mg by mouth as needed.   polyethylene glycol (MIRALAX / GLYCOLAX) 17 g packet Take 17 g by mouth daily.   rosuvastatin (CRESTOR) 20 MG tablet Take 1 tablet  (20 mg total) by mouth daily.   Saline 0.65 % (Soln) SOLN Place 1 spray into the nose as needed.   temazepam (RESTORIL) 15 MG capsule 1-2 tab by mouth at bedtime as needed for sleep   amLODipine (NORVASC) 5 MG tablet Take 1 tablet (5 mg total) by mouth daily.   metoprolol succinate (TOPROL-XL) 25 MG 24 hr tablet Take 0.5 tablets (12.5 mg total) by mouth daily. Take with or immediately following a meal.   nitroGLYCERIN (NITROSTAT) 0.4 MG SL tablet Place 1 tablet (0.4 mg total) under the tongue every 5 (five) minutes as needed for chest pain.   [DISCONTINUED] amoxicillin (AMOXIL) 500 MG capsule Take 500 mg by mouth 3 (three) times daily.   [DISCONTINUED] antiseptic oral rinse (BIOTENE) LIQD 15 mLs by Mouth Rinse route as needed for dry mouth.   [DISCONTINUED] citalopram (CELEXA) 10 MG tablet Take 1 tablet by mouth once daily   [DISCONTINUED] traMADol (ULTRAM) 50 MG tablet Take 50 mg by mouth every 6 (six) hours as needed.   No facility-administered encounter medications on file as of 08/20/2020.    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   COPD 01/08/2010   DEPRESSIVE DISORDER 12/23/2006   FATIGUE 01/08/2010   HYPERLIPIDEMIA 01/08/2010   LOW BACK PAIN 12/23/2006   OAB (overactive bladder) 10/07/2011  OSTEOPOROSIS 01/08/2010   Other specified forms of hearing loss 01/08/2010   PONV (postoperative nausea and vomiting)    SINUSITIS- ACUTE-NOS 07/09/2007   Urticaria    Vitamin D deficiency 04/30/2010   Past Surgical History:  Procedure Laterality Date   ABDOMINAL HYSTERECTOMY     APPENDECTOMY  1988   BREAST BIOPSY  1986   COLON RESECTION N/A 06/03/2013   Procedure: LAPAROSCOPY DIAGNOSTIC and OPEN  SIGMOID COLON RESECTION ;  Surgeon: Axel Filler, MD;  Location: [facility];  Service: General;  Laterality: N/A;   COLON SURGERY  2000   colon obstruction    larygenscopy     OOPHORECTOMY  bilat 1988   secondary to cyst   Family History  Problem Relation Age of Onset   Colon cancer Mother    Colon cancer Brother    Diabetes Maternal Aunt    Diabetes Maternal Grandmother    Allergic rhinitis Neg Hx    Angioedema Neg Hx    Asthma Neg Hx    Eczema Neg Hx    Immunodeficiency Neg Hx    Urticaria Neg Hx    Social History   Socioeconomic History   Marital status: Widowed    Spouse name: Not on file   Number of children: 3   Years of education: Not on file   Highest education level: Not on file  Occupational History   Occupation: Retired    Associate Professor: Designer, television/film set [employer]  Tobacco Use   Smoking status: Every Day    Packs/day: 1.00    Years: 44.00    Pack years: 44.00    Types: Cigarettes   Smokeless tobacco: Never   Tobacco comments:    smoking more due to move/ trying to quit  Vaping Use   Vaping Use: Never used  Substance and Sexual Activity   Alcohol use: Yes    Alcohol/week: 0.0 standard drinks    Comment: vodka nightly   Drug use: No   Sexual activity: Not Currently  Other Topics Concern   Not on file  Social History Narrative   Not on file   Social Determinants of Health   Financial Resource Strain: Low Risk    Difficulty of Paying Living Expenses: Not hard at all  Food Insecurity: No Food Insecurity   Worried About Programme researcher, broadcasting/film/video in the Last Year: Never true   Ran Out of Food in the Last Year: Never true  Transportation Needs: No Transportation Needs   Lack of Transportation (Medical): No   Lack of Transportation (Non-Medical): No  Physical Activity: Sufficiently Active   Days of Exercise per Week: 5 days   Minutes of Exercise per Session: 30 min  Stress: No Stress Concern Present   Feeling of Stress : Not at all  Social Connections: Moderately Integrated   Frequency of Communication with Friends and Family: More than three times a week   Frequency of Social Gatherings with Friends and Family: Once  a week   Attends Religious Services: More than 4 times per year   Active Member of [employer] or Organizations: Yes   Attends Banker Meetings: More than 4 times per year   Marital Status: Widowed    Tobacco Counseling Ready to quit: No Counseling given: Not Answered Tobacco comments: smoking more due to move/ trying to quit   Clinical Intake:  Pre-visit preparation completed: Yes  Pain : No/denies pain Pain Score: 0-No pain     BMI - recorded: 22.17 Nutritional Status: BMI of 19-24  Normal Nutritional Risks: None Diabetes: No  How often do you need to have someone help you when you read instructions, pamphlets, or other written materials from your doctor or pharmacy?: 1 - Never What is the last grade level you completed in school?: [employer] Graduate  Diabetic? no  Interpreter Needed?: No  Information entered by :: Susie Cassette, LPN   Activities of Daily Living In your present state of health, do you have any difficulty performing the following activities: 08/20/2020  Hearing? N  Comment wears hearing aids  Vision? N  Difficulty concentrating or making decisions? N  Walking or climbing stairs? N  Dressing or bathing? N  Doing errands, shopping? N  Preparing Food and eating ? N  Using the Toilet? N  In the past six months, have you accidently leaked urine? Y  Comment wears protection for long traveling rides  Do you have problems with loss of bowel control? N  Managing your Medications? N  Managing your Finances? N  Housekeeping or managing your Housekeeping? N  Some recent data might be hidden    Patient Care Team: Corwin Levins, MD as PCP - General Cro[place], Rachelle Hora, MD as PCP - Cardiology (Cardiology)  Indicate any recent Medical Services you may have received from other than [facility] providers in the past year (date may be approximate).     Assessment:   This is a routine wellness examination for [place].  Hearing/Vision screen Hearing  Screening - Comments:: Patient wears hearing aids. Vision Screening - Comments:: Patient wears glasses to read.  Eye exam done by [facility] [place], [place]).  Dietary issues and exercise activities discussed: Current Exercise Habits: Home exercise routine, Type of exercise: walking, Time (Minutes): 30, Frequency (Times/Week): 5, Weekly Exercise (Minutes/Week): 150, Intensity: Moderate, Exercise limited by: cardiac condition(s);psychological condition(s)   Goals Addressed   None   Depression Screen PHQ 2/9 Scores 08/20/2020 08/16/2019 08/12/2018 07/20/2017 07/02/2017 06/19/2016 11/28/2014  PHQ - 2 Score 0 0 0 0 1 0 0  PHQ- 9 Score - - 1 0 5 3 -    Fall Risk Fall Risk  08/20/2020 08/16/2019 08/12/2018 07/20/2017 07/02/2017  Falls in the past year? 0 0 0 No No  Comment - - - - -  Number falls in past yr: 0 0 0 - -  Injury with Fall? 0 0 - - -  Risk for fall due to : No Fall Risks No Fall Risks - - -  Follow up Falls evaluation completed Falls evaluation completed - - -    FALL RISK PREVENTION PERTAINING TO THE HOME:  Any stairs in or around the home? Yes  If so, are there any without handrails? No  Home free of loose throw rugs in walkways, pet beds, electrical cords, etc? Yes  Adequate lighting in your home to reduce risk of falls? Yes   ASSISTIVE DEVICES UTILIZED TO PREVENT FALLS:  Life alert? Yes  Use of a cane, walker or w/c? No  Grab bars in the bathroom? Yes  Shower chair or bench in shower? Yes  Elevated toilet seat or a handicapped toilet? No   TIMED UP AND GO:  Was the test performed? Yes .  Length of time to ambulate 10 feet: 6 sec.   Gait steady and fast without use of assistive device  Cognitive Function: Normal cognitive status assessed by direct observation by this Nurse Health Advisor. No abnormalities found.   MMSE - Mini Mental State Exam 08/12/2018 11/28/2014  Not completed: - (  No Data)  Orientation to time 5 -  Orientation to Place 5 -  Registration 3 -   Attention/ Calculation 5 -  Recall 3 -  Language- name 2 objects 2 -  Language- repeat 1 -  Language- follow 3 step command 3 -  Language- read & follow direction 1 -  Write a sentence 1 -  Copy design 1 -  Total score 30 -     6CIT Screen 08/16/2019  What Year? 0 points  What month? 0 points  What time? 0 points  Count back from 20 0 points  Months in reverse 0 points  Repeat phrase 0 points  Total Score 0    Immunizations Immunization History  Administered Date(s) Administered   Influenza Split 10/07/2011   Influenza Whole 01/08/2010   Influenza, High Dose Seasonal PF 11/28/2014, 01/06/2018, 10/27/2019   Influenza,inj,Quad PF,6+ Mos 12/20/2013, 11/28/2015   Influenza-Unspecified 11/27/2017   PFIZER(Purple Top)SARS-COV-2 Vaccination 03/27/2019, 04/20/2019, 01/16/2020, 08/07/2020   Pneumococcal Conjugate-13 12/28/2014   Pneumococcal Polysaccharide-23 01/08/2010   Tdap 10/07/2011   Zoster Recombinat (Shingrix) 05/20/2017, 06/25/2017, 07/20/2017    TDAP status: Up to date  Flu Vaccine status: Up to date  Pneumococcal vaccine status: Up to date  Covid-19 vaccine status: Completed vaccines  Qualifies for Shingles Vaccine? Yes   Zostavax completed No   Shingrix Completed?: Yes  Screening Tests Health Maintenance  Topic Date Due   INFLUENZA VACCINE  08/27/2020   COVID-19 Vaccine (5 - Booster for Pfizer series) 12/08/2020   TETANUS/TDAP  10/06/2021   MAMMOGRAM  12/28/2021   COLONOSCOPY (Pts 45-67yrs Insurance coverage will need to be confirmed)  05/12/2022   DEXA SCAN  Completed   Hepatitis C Screening  Completed   PNA vac Low Risk Adult  Completed   Zoster Vaccines- Shingrix  Completed   HPV VACCINES  Aged Out    Health Maintenance  There are no preventive care reminders to display for this patient.  Colorectal cancer screening: Type of screening: Colonoscopy. Completed 05/11/2017. Repeat every 5 years  Mammogram status: Completed 12/29/2019. Repeat every  year  Bone density status: last done 03/06/2010; not recommended  Lung Cancer Screening: (Low Dose CT Chest recommended if Age 70-80 years, 30 pack-year currently smoking OR have quit w/in 15years.) does not qualify.   Lung Cancer Screening Referral: no  Additional Screening:  Hepatitis C Screening: does qualify; Completed no  Vision Screening: Recommended annual ophthalmology exams for early detection of glaucoma and other disorders of the eye. Is the patient up to date with their annual eye exam?  Yes  Who is the provider or what is the name of the office in which the patient attends annual eye exams? [facility] [place], [place]) If pt is not established with a provider, would they like to be referred to a provider to establish care? No .   Dental Screening: Recommended annual dental exams for proper oral hygiene  Community Resource Referral / Chronic Care Management: CRR required this visit?  No   CCM required this visit?  No      Plan:     I have personally reviewed and noted the following in the patient's chart:   Medical and social history Use of alcohol, tobacco or illicit drugs  Current medications and supplements including opioid prescriptions.  Functional ability and status Nutritional status Physical activity Advanced directives List of other physicians Hospitalizations, surgeries, and ER visits in previous 12 months Vitals Screenings to include cognitive, depression, and falls Referrals and  appointments  In addition, I have reviewed and discussed with patient certain preventive protocols, quality metrics, and best practice recommendations. A written personalized care plan for preventive services as well as general preventive health recommendations were provided to patient.     Mickeal NeedyShenika N Dequita Schleicher, LPN   1/19/14787/25/2022   Nurse Notes: n/a

## 2020-08-20 NOTE — Assessment & Plan Note (Signed)
Pt does not want to reschedule LDCT for lung cancer screening at this time

## 2020-08-20 NOTE — Assessment & Plan Note (Signed)
Age and sex appropriate education and counseling updated with regular exercise and diet Referrals for preventative services - none needed Immunizations addressed - none needed Smoking counseling  - counseled to quit, pt not ready Evidence for depression or other mood disorder - + mild worsening, see notes Most recent labs reviewed. I have personally reviewed and have noted: 1) the patient's medical and social history 2) The patient's current medications and supplements 3) The patient's height, weight, and BMI have been recorded in the chart

## 2020-08-20 NOTE — Assessment & Plan Note (Signed)
Pt counseled to quit, pt not ready 

## 2020-08-20 NOTE — Assessment & Plan Note (Signed)
Chronic stable, ok for temazepam qhs prn,  to f/u any worsening symptoms or concerns

## 2020-08-20 NOTE — Assessment & Plan Note (Signed)
Uncontrolled, no SI or HI, to try change to zoloft 50 qd, declines referral for counseling

## 2020-08-20 NOTE — Assessment & Plan Note (Signed)
Last vitamin D Lab Results  Component Value Date   VD25OH 28 (L) 08/16/2019   Low, to start oral replacement

## 2020-08-20 NOTE — Patient Instructions (Addendum)
Michelle Hubbard , Thank you for taking time to come for your Medicare Wellness Visit. I appreciate your ongoing commitment to your health goals. Please review the following plan we discussed and let me know if I can assist you in the future.   Screening recommendations/referrals: Colonoscopy: last done 05/11/2017; due every 5 years Mammogram: last done 12/29/2019; due every year Bone Density: last done 03/06/2010; not recommended Recommended yearly ophthalmology/optometry visit for glaucoma screening and checkup Recommended yearly dental visit for hygiene and checkup  Vaccinations: Influenza vaccine: 10/27/2019 Pneumococcal vaccine: 01/08/2010, 12/28/2014 Tdap vaccine: 10/07/2011; due every 10 years Shingles vaccine: completed   Covid-19: completed  Advanced directives: Please bring a copy of your health care power of attorney and living will to the office at your convenience.  Conditions/risks identified: Yes; Client understands the importance of follow-up with providers by attending scheduled visits and discussed goals to eat healthier, increase physical activity, exercise the brain, socialize more, get enough sleep and make time for laughter.  Next appointment: Please schedule your next Medicare Wellness Visit with your Nurse Health Advisor in 1 year by calling [phone].   Preventive Care 29 Years and Older, Female Preventive care refers to lifestyle choices and visits with your health care provider that can promote health and wellness. What does preventive care include? A yearly physical exam. This is also called an annual well check. Dental exams once or twice a year. Routine eye exams. Ask your health care provider how often you should have your eyes checked. Personal lifestyle choices, including: Daily care of your teeth and gums. Regular physical activity. Eating a healthy diet. Avoiding tobacco and drug use. Limiting alcohol use. Practicing safe sex. Taking low-dose aspirin  every day. Taking vitamin and mineral supplements as recommended by your health care provider. What happens during an annual well check? The services and screenings done by your health care provider during your annual well check will depend on your age, overall health, lifestyle risk factors, and family history of disease. Counseling  Your health care provider may ask you questions about your: Alcohol use. Tobacco use. Drug use. Emotional well-being. Home and relationship well-being. Sexual activity. Eating habits. History of falls. Memory and ability to understand (cognition). Work and work Astronomer. Reproductive health. Screening  You may have the following tests or measurements: Height, weight, and BMI. Blood pressure. Lipid and cholesterol levels. These may be checked every 5 years, or more frequently if you are over 40 years old. Skin check. Lung cancer screening. You may have this screening every year starting at age 55 if you have a 30-pack-year history of smoking and currently smoke or have quit within the past 15 years. Fecal occult blood test (FOBT) of the stool. You may have this test every year starting at age 79. Flexible sigmoidoscopy or colonoscopy. You may have a sigmoidoscopy every 5 years or a colonoscopy every 10 years starting at age 83. Hepatitis C blood test. Hepatitis B blood test. Sexually transmitted disease (STD) testing. Diabetes screening. This is done by checking your blood sugar (glucose) after you have not eaten for a while (fasting). You may have this done every 1-3 years. Bone density scan. This is done to screen for osteoporosis. You may have this done starting at age 62. Mammogram. This may be done every 1-2 years. Talk to your health care provider about how often you should have regular mammograms. Talk with your health care provider about your test results, treatment options, and if necessary, the need for more  tests. Vaccines  Your health  care provider may recommend certain vaccines, such as: Influenza vaccine. This is recommended every year. Tetanus, diphtheria, and acellular pertussis (Tdap, Td) vaccine. You may need a Td booster every 10 years. Zoster vaccine. You may need this after age 33. Pneumococcal 13-valent conjugate (PCV13) vaccine. One dose is recommended after age 54. Pneumococcal polysaccharide (PPSV23) vaccine. One dose is recommended after age 2. Talk to your health care provider about which screenings and vaccines you need and how often you need them. This information is not intended to replace advice given to you by your health care provider. Make sure you discuss any questions you have with your health care provider. Document Released: 02/09/2015 Document Revised: 10/03/2015 Document Reviewed: 11/14/2014 Elsevier Interactive Patient Education  2017 [employer].  Fall Prevention in the Home Falls can cause injuries. They can happen to people of all ages. There are many things you can do to make your home safe and to help prevent falls. What can I do on the outside of my home? Regularly fix the edges of walkways and driveways and fix any cracks. Remove anything that might make you trip as you walk through a door, such as a raised step or threshold. Trim any bushes or trees on the path to your home. Use bright outdoor lighting. Clear any walking paths of anything that might make someone trip, such as rocks or tools. Regularly check to see if handrails are loose or broken. Make sure that both sides of any steps have handrails. Any raised decks and porches should have guardrails on the edges. Have any leaves, snow, or ice cleared regularly. Use sand or salt on walking paths during winter. Clean up any spills in your garage right away. This includes oil or grease spills. What can I do in the bathroom? Use night lights. Install grab bars by the toilet and in the tub and shower. Do not use towel bars as grab  bars. Use non-skid mats or decals in the tub or shower. If you need to sit down in the shower, use a plastic, non-slip stool. Keep the floor dry. Clean up any water that spills on the floor as soon as it happens. Remove soap buildup in the tub or shower regularly. Attach bath mats securely with double-sided non-slip rug tape. Do not have throw rugs and other things on the floor that can make you trip. What can I do in the bedroom? Use night lights. Make sure that you have a light by your bed that is easy to reach. Do not use any sheets or blankets that are too big for your bed. They should not hang down onto the floor. Have a firm chair that has side arms. You can use this for support while you get dressed. Do not have throw rugs and other things on the floor that can make you trip. What can I do in the kitchen? Clean up any spills right away. Avoid walking on wet floors. Keep items that you use a lot in easy-to-reach places. If you need to reach something above you, use a strong step stool that has a grab bar. Keep electrical cords out of the way. Do not use floor polish or wax that makes floors slippery. If you must use wax, use non-skid floor wax. Do not have throw rugs and other things on the floor that can make you trip. What can I do with my stairs? Do not leave any items on the stairs. Make sure  that there are handrails on both sides of the stairs and use them. Fix handrails that are broken or loose. Make sure that handrails are as long as the stairways. Check any carpeting to make sure that it is firmly attached to the stairs. Fix any carpet that is loose or worn. Avoid having throw rugs at the top or bottom of the stairs. If you do have throw rugs, attach them to the floor with carpet tape. Make sure that you have a light switch at the top of the stairs and the bottom of the stairs. If you do not have them, ask someone to add them for you. What else can I do to help prevent  falls? Wear shoes that: Do not have high heels. Have rubber bottoms. Are comfortable and fit you well. Are closed at the toe. Do not wear sandals. If you use a stepladder: Make sure that it is fully opened. Do not climb a closed stepladder. Make sure that both sides of the stepladder are locked into place. Ask someone to hold it for you, if possible. Clearly mark and make sure that you can see: Any grab bars or handrails. First and last steps. Where the edge of each step is. Use tools that help you move around (mobility aids) if they are needed. These include: Canes. Walkers. Scooters. Crutches. Turn on the lights when you go into a dark area. Replace any light bulbs as soon as they burn out. Set up your furniture so you have a clear path. Avoid moving your furniture around. If any of your floors are uneven, fix them. If there are any pets around you, be aware of where they are. Review your medicines with your doctor. Some medicines can make you feel dizzy. This can increase your chance of falling. Ask your doctor what other things that you can do to help prevent falls. This information is not intended to replace advice given to you by your health care provider. Make sure you discuss any questions you have with your health care provider. Document Released: 11/09/2008 Document Revised: 06/21/2015 Document Reviewed: 02/17/2014 Elsevier Interactive Patient Education  2017 [employer].

## 2020-09-27 ENCOUNTER — Encounter: Payer: Self-pay | Admitting: Internal Medicine

## 2020-09-27 MED ORDER — MECLIZINE HCL 12.5 MG PO TABS: 12.5000 mg | ORAL_TABLET | Freq: Three times a day (TID) | ORAL | 1 refills | Status: DC | PRN

## 2020-10-06 ENCOUNTER — Other Ambulatory Visit: Payer: Self-pay | Admitting: Internal Medicine

## 2020-10-16 ENCOUNTER — Other Ambulatory Visit: Payer: Self-pay | Admitting: Cardiovascular Disease

## 2020-10-16 MED ORDER — ROSUVASTATIN CALCIUM 20 MG PO TABS: 20.0000 mg | ORAL_TABLET | Freq: Every day | ORAL | 1 refills | Status: DC

## 2020-11-14 ENCOUNTER — Encounter: Payer: Self-pay | Admitting: Cardiovascular Disease

## 2020-11-14 ENCOUNTER — Ambulatory Visit: Payer: Medicare PPO | Admitting: Cardiovascular Disease

## 2020-11-14 ENCOUNTER — Other Ambulatory Visit: Payer: Self-pay

## 2020-11-14 VITALS — BP 132/66 | HR 80 | Ht 65.0 in | Wt 129.2 lb

## 2020-11-14 DIAGNOSIS — I2510 Atherosclerotic heart disease of native coronary artery without angina pectoris: Secondary | ICD-10-CM

## 2020-11-14 DIAGNOSIS — E7800 Pure hypercholesterolemia, unspecified: Secondary | ICD-10-CM | POA: Diagnosis not present

## 2020-11-14 DIAGNOSIS — I700 Atherosclerosis of aorta: Secondary | ICD-10-CM

## 2020-11-14 DIAGNOSIS — F17200 Nicotine dependence, unspecified, uncomplicated: Secondary | ICD-10-CM

## 2020-11-14 DIAGNOSIS — I25118 Atherosclerotic heart disease of native coronary artery with other forms of angina pectoris: Secondary | ICD-10-CM | POA: Diagnosis not present

## 2020-11-14 NOTE — Patient Instructions (Signed)

## 2020-11-14 NOTE — Progress Notes (Signed)
Cardiology Office Note:    Date:  11/15/2020   ID:  Michelle Hubbard, DOB 1945-06-01, MRN [id number]  PCP:  Corwin Levins, MD  [facility] Cardiologist:  Thurmon Fair, MD [facility] Electrophysiologist:  None   Referring MD: Corwin Levins, MD   Chief Complaint  Patient presents with   Coronary Artery Disease     History of Present Illness:    Michelle Hubbard is a 75 y.o. female with CAD a hx of smoking and hypercholesterolemia, returning for follow-up.    From a cardiovascular point of view she is going quite well, but she has had difficulty with her dentures,  is eating less and has lost a lot of weight.  Denies intestinal angina or food aversion.  She felt "weird" when taking isosorbide and has stopped the medication, without any increase in her angina pectoris.  Infection has not required any sublingual nitroglycerin in several months.  She was initially referred after a coronary calcium CT which showed a Agatston score of 100 (91st percentile), the study being performed due to incidental finding of heavy coronary calcification on a screening CT for lung cancer.  She had symptoms of mild stable angina pectoris that she did not recognize as such.  Coronary CT angiography performed in September 2021 showed numerous mild and moderate stenotic lesions, especially in the mid and distal LAD artery and the distal left circumflex coronary artery (the latter lesion being the only one that appeared to be hemodynamically significant, with CT-FFR 0.73).  She continues to live independently, with support from her daughter.  She also continues to smoke.  She did not tolerate higher doses of rosuvastatin due to muscle aches, but her most recent lipid profile actually shows an excellent LDL cholesterol, down to 54.Marland Kitchen  Palpitations, dizziness, syncope, focal neurological complaints, intermittent claudication, lower extremity edema, orthopnea or PND.  She has NYHA functional class II  exertional dyspnea and wheezing.  She has a 44-pack-year history of smoking.    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   COPD 01/08/2010   DEPRESSIVE DISORDER 12/23/2006   FATIGUE 01/08/2010   HYPERLIPIDEMIA 01/08/2010   LOW BACK PAIN 12/23/2006   OAB (overactive bladder) 10/07/2011   OSTEOPOROSIS 01/08/2010   Other specified forms of hearing loss 01/08/2010   PONV (postoperative nausea and vomiting)    SINUSITIS- ACUTE-NOS 07/09/2007   Urticaria    Vitamin D deficiency 04/30/2010    Past Surgical History:  Procedure Laterality Date   ABDOMINAL HYSTERECTOMY     APPENDECTOMY  1988   BREAST BIOPSY  1986   COLON RESECTION N/A 06/03/2013   Procedure: LAPAROSCOPY DIAGNOSTIC and OPEN  SIGMOID COLON RESECTION ;  Surgeon: Axel Filler, MD;  Location: [facility];  Service: General;  Laterality: N/A;   COLON SURGERY  2000   colon obstruction   larygenscopy     OOPHORECTOMY  bilat 1988   secondary to cyst    Current Medications: Current Meds  Medication Sig   amLODipine (NORVASC) 5 MG tablet Take 1 tablet (5 mg total) by mouth daily.   Cholecalciferol (VITAMIN D-3 PO) Take by mouth daily in the afternoon.   Coenzyme Q10 (COQ10) 100 MG CAPS Take 3 capsules by mouth daily.   levocetirizine (XYZAL) 5 MG tablet Take 5 mg by mouth every evening.   metoprolol succinate (TOPROL-XL) 25 MG 24 hr tablet Take 0.5 tablets (12.5 mg total) by mouth daily. Take with or immediately following  a meal.   nitroGLYCERIN (NITROSTAT) 0.4 MG SL tablet Place 1 tablet (0.4 mg total) under the tongue every 5 (five) minutes as needed for chest pain.   omeprazole (PRILOSEC) 10 MG capsule Take 10 mg by mouth as needed.   polyethylene glycol (MIRALAX / GLYCOLAX) 17 g packet Take 17 g by mouth daily.   rosuvastatin (CRESTOR) 20 MG tablet Take 1 tablet (20 mg total) by mouth daily.   temazepam (RESTORIL) 15 MG capsule 1-2 tab by mouth at bedtime as needed for sleep      Allergies:   Codeine, Lortab [hydrocodone-acetaminophen], and Penicillins   Social History   Socioeconomic History   Marital status: Widowed    Spouse name: Not on file   Number of children: 3   Years of education: Not on file   Highest education level: Not on file  Occupational History   Occupation: Retired    Associate Professor: Designer, television/film set [employer]  Tobacco Use   Smoking status: Every Day    Packs/day: 1.00    Years: 44.00    Pack years: 44.00    Types: Cigarettes   Smokeless tobacco: Never   Tobacco comments:    smoking more due to move/ trying to quit  Vaping Use   Vaping Use: Never used  Substance and Sexual Activity   Alcohol use: Yes    Alcohol/week: 0.0 standard drinks    Comment: vodka nightly   Drug use: No   Sexual activity: Not Currently  Other Topics Concern   Not on file  Social History Narrative   Not on file   Social Determinants of Health   Financial Resource Strain: Low Risk    Difficulty of Paying Living Expenses: Not hard at all  Food Insecurity: No Food Insecurity   Worried About Programme researcher, broadcasting/film/video in the Last Year: Never true   Ran Out of Food in the Last Year: Never true  Transportation Needs: No Transportation Needs   Lack of Transportation (Medical): No   Lack of Transportation (Non-Medical): No  Physical Activity: Sufficiently Active   Days of Exercise per Week: 5 days   Minutes of Exercise per Session: 30 min  Stress: No Stress Concern Present   Feeling of Stress : Not at all  Social Connections: Moderately Integrated   Frequency of Communication with Friends and Family: More than three times a week   Frequency of Social Gatherings with Friends and Family: Once a week   Attends Religious Services: More than 4 times per year   Active Member of [employer] or Organizations: Yes   Attends Banker Meetings: More than 4 times per year   Marital Status: Widowed     Family History: The patient's family history includes Colon cancer  in her brother and mother; Diabetes in her maternal aunt and maternal grandmother. There is no history of Allergic rhinitis, Angioedema, Asthma, Eczema, Immunodeficiency, or Urticaria.  ROS:   Please see the history of present illness.     All other systems reviewed and are negative.  EKGs/Labs/Other Studies Reviewed:    The following studies were reviewed today: Coronary CT and CT FFR  EKG:  EKG is  ordered today.  Personally reviewed, shows normal sinus rhythm and is a completely normal tracing.  No ischemic repolarization abnormalities, QTC 450 ms  Recent Labs: 06/24/2020: ALT 16; BUN 13; Creatinine, Ser 0.73; Hemoglobin 13.1; Platelets 318; Potassium 4.1; Sodium 135  Recent Lipid Panel    Component Value Date/Time   CHOL 129  06/05/2020 0835   TRIG 62 06/05/2020 0835   HDL 62 06/05/2020 0835   CHOLHDL 2.1 06/05/2020 0835   CHOLHDL 3.1 08/16/2019 1518   VLDL 22.6 08/12/2018 1457   LDLCALC 54 06/05/2020 0835   LDLCALC 119 (H) 08/16/2019 1518   LDLDIRECT 146.3 10/07/2011 0919    Physical Exam:    VS:  BP 132/66 (BP Location: Left Arm, Patient Position: Sitting, Cuff Size: Normal)   Pulse 80   Ht 5\' 5"  (1.651 m)   Wt 129 lb 3.2 oz (58.6 kg)   SpO2 96%   BMI 21.50 kg/m     Wt Readings from Last 3 Encounters:  11/14/20 129 lb 3.2 oz (58.6 kg)  08/20/20 133 lb (60.3 kg)  08/20/20 133 lb 3.2 oz (60.4 kg)      General: Alert, oriented x3, no distress, slender, appears borderline underweight Head: no evidence of trauma, PERRL, EOMI, no exophtalmos or lid lag, no myxedema, no xanthelasma; normal ears, nose and oropharynx Neck: normal jugular venous pulsations and no hepatojugular reflux; brisk carotid pulses without delay and no carotid bruits Chest: clear to auscultation, but with diminished breath sounds throughout, no signs of consolidation by percussion or palpation, normal fremitus, symmetrical and full respiratory excursions Cardiovascular: normal position and quality  of the apical impulse, regular rhythm, normal first and second heart sounds, no murmurs, rubs or gallops Abdomen: no tenderness or distention, no masses by palpation, no abnormal pulsatility or arterial bruits, normal bowel sounds, no hepatosplenomegaly Extremities: no clubbing, cyanosis or edema; 2+ radial, ulnar and brachial pulses bilaterally; 2+ right femoral, posterior tibial and dorsalis pedis pulses; 2+ left femoral, posterior tibial and dorsalis pedis pulses; no subclavian or femoral bruits Neurological: grossly nonfocal Psych: Normal mood and affect   ASSESSMENT:    1. Coronary artery disease of native artery of native heart with stable angina pectoris (HCC)   2. Hypercholesterolemia   3. Smoking   4. Atherosclerosis of aorta (HCC)     PLAN:    In order of problems listed above:  Stable angina pectoris: On medical therapy, since the CT angiography did not show high risk anatomy.  Symptoms have been well controlled with combination metoprolol plus amlodipine.  Isosorbide stopped.  Very infrequent need for nitroglycerin.  On statin and daily aspirin. HLP: Excellent LDL cholesterol.  Continue rosuvastatin. Smoking: 44-pack-year history of smoking.  Again strongly encouraged her to quit smoking and discussed several methods to achieve lasting smoking cessation.  I strongly suspect that her mild exertional dyspnea is related to chronic emphysema. Atherosclerosis of the aorta: Incidentally reported on imaging studies.  No evidence of aortic aneurysm.   Medication Adjustments/Labs and Tests Ordered: Current medicines are reviewed at length with the patient today.  Concerns regarding medicines are outlined above.  Orders Placed This Encounter  Procedures   EKG 12-Lead    No orders of the defined types were placed in this encounter.   Patient Instructions  Medication Instructions:  No changes *If you need a refill on your cardiac medications before your next appointment, please  call your pharmacy*   Lab Work: None ordered If you have labs (blood work) drawn today and your tests are completely normal, you will receive your results only by: MyChart Message (if you have MyChart) OR A paper copy in the mail If you have any lab test that is abnormal or we need to change your treatment, we will call you to review the results.   Testing/Procedures: None ordered   Follow-Up:  At [facility], you and your health needs are our priority.  As part of our continuing mission to provide you with exceptional heart care, we have created designated Provider Care Teams.  These Care Teams include your primary Cardiologist (physician) and Advanced Practice Providers (APPs -  Physician Assistants and Nurse Practitioners) who all work together to provide you with the care you need, when you need it.  We recommend signing up for the patient portal called "MyChart".  Sign up information is provided on this After Visit Summary.  MyChart is used to connect with patients for Virtual Visits (Telemedicine).  Patients are able to view lab/test results, encounter notes, upcoming appointments, etc.  Non-urgent messages can be sent to your provider as well.   To learn more about what you can do with MyChart, go to [email or web address].    Your next appointment:   12 month(s)  The format for your next appointment:   In Person  Provider:   You may see Thurmon Fair, MD or one of the following Advanced Practice Providers on your designated Care Team:   Azalee Course, PA-C Micah Flesher, [place] or  Judy Pimple, PA-C    Signed, Thurmon Fair, MD  11/15/2020 3:07 PM    [facility]

## 2020-11-15 ENCOUNTER — Encounter: Payer: Self-pay | Admitting: Cardiovascular Disease

## 2020-11-15 MED ORDER — ASPIRIN EC 81 MG PO TBEC: 81.0000 mg | DELAYED_RELEASE_TABLET | Freq: Every day | ORAL | 3 refills | Status: DC

## 2021-02-18 DIAGNOSIS — H40013 Open angle with borderline findings, low risk, bilateral: Secondary | ICD-10-CM | POA: Diagnosis not present

## 2021-02-18 DIAGNOSIS — H5212 Myopia, left eye: Secondary | ICD-10-CM | POA: Diagnosis not present

## 2021-02-28 ENCOUNTER — Encounter: Payer: Self-pay | Admitting: Internal Medicine

## 2021-03-01 NOTE — Telephone Encounter (Signed)
From my point of view, going back to the same thing she was unhappy with before doesn't seem like a good idea  We can increase the dose of the zoloft to 100 mg or we could go to celexa 20 mg instead of 10  Just let me know

## 2021-03-05 MED ORDER — TEMAZEPAM 15 MG PO CAPS: ORAL_CAPSULE | ORAL | 5 refills | Status: DC

## 2021-03-05 NOTE — Telephone Encounter (Signed)
Please advise patient is requesting refill of her Temazepam. Thank you

## 2021-03-27 ENCOUNTER — Other Ambulatory Visit: Payer: Self-pay | Admitting: Cardiovascular Disease

## 2021-03-27 MED ORDER — AMLODIPINE BESYLATE 5 MG PO TABS: 5.0000 mg | ORAL_TABLET | Freq: Every day | ORAL | 3 refills | Status: DC

## 2021-04-01 ENCOUNTER — Other Ambulatory Visit: Payer: Self-pay | Admitting: Cardiovascular Disease

## 2021-04-01 ENCOUNTER — Encounter: Payer: Self-pay | Admitting: Internal Medicine

## 2021-04-01 MED ORDER — METOPROLOL SUCCINATE ER 25 MG PO TB24: 12.5000 mg | ORAL_TABLET | Freq: Every day | ORAL | 2 refills | Status: DC

## 2021-04-01 NOTE — Telephone Encounter (Signed)
Rx(s) sent to pharmacy electronically.  

## 2021-04-08 ENCOUNTER — Ambulatory Visit: Payer: Medicare PPO | Admitting: Internal Medicine

## 2021-04-08 ENCOUNTER — Encounter: Payer: Self-pay | Admitting: Internal Medicine

## 2021-04-08 ENCOUNTER — Other Ambulatory Visit: Payer: Self-pay

## 2021-04-08 VITALS — BP 114/60 | HR 90 | Temp 98.2°F | Ht 65.0 in | Wt 128.0 lb

## 2021-04-08 DIAGNOSIS — R739 Hyperglycemia, unspecified: Secondary | ICD-10-CM | POA: Diagnosis not present

## 2021-04-08 DIAGNOSIS — Z0001 Encounter for general adult medical examination with abnormal findings: Secondary | ICD-10-CM

## 2021-04-08 DIAGNOSIS — J309 Allergic rhinitis, unspecified: Secondary | ICD-10-CM

## 2021-04-08 DIAGNOSIS — F17200 Nicotine dependence, unspecified, uncomplicated: Secondary | ICD-10-CM | POA: Diagnosis not present

## 2021-04-08 DIAGNOSIS — E538 Deficiency of other specified B group vitamins: Secondary | ICD-10-CM

## 2021-04-08 DIAGNOSIS — E785 Hyperlipidemia, unspecified: Secondary | ICD-10-CM

## 2021-04-08 DIAGNOSIS — I700 Atherosclerosis of aorta: Secondary | ICD-10-CM

## 2021-04-08 DIAGNOSIS — E559 Vitamin D deficiency, unspecified: Secondary | ICD-10-CM

## 2021-04-08 LAB — HEPATIC FUNCTION PANEL
ALT: 58 U/L — ABNORMAL HIGH (ref 0–35)
AST: 46 U/L — ABNORMAL HIGH (ref 0–37)
Albumin: 4.6 g/dL (ref 3.5–5.2)
Alkaline Phosphatase: 57 U/L (ref 39–117)
Bilirubin, Direct: 0 mg/dL (ref 0.0–0.3)
Total Bilirubin: 0.4 mg/dL (ref 0.2–1.2)
Total Protein: 7.4 g/dL (ref 6.0–8.3)

## 2021-04-08 LAB — URINALYSIS, ROUTINE W REFLEX MICROSCOPIC
Bilirubin Urine: NEGATIVE
Hgb urine dipstick: NEGATIVE
Ketones, ur: NEGATIVE
Leukocytes,Ua: NEGATIVE
Nitrite: NEGATIVE
RBC / HPF: NONE SEEN (ref 0–?)
Specific Gravity, Urine: <=1.005 — AB (ref 1.000–1.030)
Total Protein, Urine: NEGATIVE
Urine Glucose: NEGATIVE
Urobilinogen, UA: 0.2 (ref 0.0–1.0)
WBC, UA: NONE SEEN (ref 0–?)
pH: 6 (ref 5.0–8.0)

## 2021-04-08 LAB — CBC WITH DIFFERENTIAL/PLATELET
Basophils Absolute: 0 10*3/uL (ref 0.0–0.1)
Basophils Relative: 0.4 % (ref 0.0–3.0)
Eosinophils Absolute: 0 10*3/uL (ref 0.0–0.7)
Eosinophils Relative: 0.5 % (ref 0.0–5.0)
HCT: 42.6 % (ref 36.0–46.0)
Hemoglobin: 14.3 g/dL (ref 12.0–15.0)
Lymphocytes Relative: 11.1 % — ABNORMAL LOW (ref 12.0–46.0)
Lymphs Abs: 1.1 10*3/uL (ref 0.7–4.0)
MCHC: 33.6 g/dL (ref 30.0–36.0)
MCV: 94.4 fl (ref 78.0–100.0)
Monocytes Absolute: 0.8 10*3/uL (ref 0.1–1.0)
Monocytes Relative: 7.9 % (ref 3.0–12.0)
Neutro Abs: 7.8 10*3/uL — ABNORMAL HIGH (ref 1.4–7.7)
Neutrophils Relative %: 80.1 % — ABNORMAL HIGH (ref 43.0–77.0)
Platelets: 329 10*3/uL (ref 150.0–400.0)
RBC: 4.51 Mil/uL (ref 3.87–5.11)
RDW: 13 % (ref 11.5–15.5)
WBC: 9.7 10*3/uL (ref 4.0–10.5)

## 2021-04-08 LAB — VITAMIN D 25 HYDROXY (VIT D DEFICIENCY, FRACTURES): VITD: 36.6 ng/mL (ref 30.00–100.00)

## 2021-04-08 LAB — BASIC METABOLIC PANEL
BUN: 18 mg/dL (ref 6–23)
CO2: 27 mEq/L (ref 19–32)
Calcium: 9.7 mg/dL (ref 8.4–10.5)
Chloride: 100 mEq/L (ref 96–112)
Creatinine, Ser: 0.61 mg/dL (ref 0.40–1.20)
GFR: 86.35 mL/min (ref >60.00)
Glucose, Bld: 104 mg/dL — ABNORMAL HIGH (ref 70–99)
Potassium: 4 mEq/L (ref 3.5–5.1)
Sodium: 137 mEq/L (ref 135–145)

## 2021-04-08 LAB — LIPID PANEL
Cholesterol: 138 mg/dL (ref 0–200)
HDL: 67.6 mg/dL (ref >39.00)
LDL Cholesterol: 51 mg/dL (ref 0–99)
NonHDL: 70.89
Total CHOL/HDL Ratio: 2
Triglycerides: 100 mg/dL (ref 0.0–149.0)
VLDL: 20 mg/dL (ref 0.0–40.0)

## 2021-04-08 LAB — HEMOGLOBIN A1C: Hgb A1c MFr Bld: 6 % (ref 4.6–6.5)

## 2021-04-08 LAB — TSH: TSH: 1.55 u[IU]/mL (ref 0.35–5.50)

## 2021-04-08 LAB — VITAMIN B12: Vitamin B-12: 317 pg/mL (ref 211–911)

## 2021-04-08 MED ORDER — METHYLPREDNISOLONE ACETATE 80 MG/ML IJ SUSP
80.0000 mg | Freq: Once | INTRAMUSCULAR | Status: AC
  Administered 2021-09-09: 80 mg via INTRAMUSCULAR

## 2021-04-08 NOTE — Assessment & Plan Note (Signed)
Age and sex appropriate education and counseling updated with regular exercise and diet ?Referrals for preventative services - none needed ?Immunizations addressed - declines covid booster, flu shot ?Smoking counseling  - counseled to quit, pt not ready ?Evidence for depression or other mood disorder - none significant ?Most recent labs reviewed. ?I have personally reviewed and have noted: ?1) the patient's medical and social history ?2) The patient's current medications and supplements ?3) The patient's height, weight, and BMI have been recorded in the chart ? ?

## 2021-04-08 NOTE — Progress Notes (Signed)
Patient ID: Michelle SkainsNancy K Hubbard, female   DOB: [birth year], 76 y.o.   MRN: [id number]         Chief Complaint:: wellness exam and Dizziness (Patient was hit in the head by a curtain rod 03/25/21; c/o having pressure in head, nausea, and some dizziness)  , allergies, low vit d, hld, hyperglycemia       HPI:  Michelle Hubbard is a 76 y.o. female here for wellness exam; declines covid booster, flu shot, o/w up to date                        Also Does have several wks ongoing nasal allergy symptoms with clearish congestion, itch and sneezing, without fever, pain, ST, cough, swelling or wheezing, but has increased dizziness and nausea with head movement, incidentally somewhat improved yesterday and today.  Was hit on forehead with what sounds like contusion to head feb 27 but now resolved.  Pt denies chest pain, increased sob or doe, wheezing, orthopnea, PND, increased LE swelling, palpitations, dizziness or syncope.   Pt denies polydipsia, polyuria, or new focal neuro s/s.  Pt denies fever, wt loss, night sweats, loss of appetite, or other constitutional symptoms    Wt Readings from Last 3 Encounters:  04/08/21 128 lb (58.1 kg)  11/14/20 129 lb 3.2 oz (58.6 kg)  08/20/20 133 lb (60.3 kg)   BP Readings from Last 3 Encounters:  04/08/21 114/60  11/14/20 132/66  08/20/20 124/60   Immunization History  Administered Date(s) Administered   Influenza Split 10/07/2011   Influenza Whole 01/08/2010   Influenza, High Dose Seasonal PF 11/28/2014, 01/06/2018, 10/27/2019   Influenza,inj,Quad PF,6+ Mos 12/20/2013, 11/28/2015   Influenza-Unspecified 11/27/2017   PFIZER(Purple Top)SARS-COV-2 Vaccination 03/27/2019, 04/20/2019, 01/16/2020, 08/07/2020   Pneumococcal Conjugate-13 12/28/2014   Pneumococcal Polysaccharide-23 01/08/2010   Tdap 10/07/2011   Zoster Recombinat (Shingrix) 05/20/2017, 06/25/2017, 07/20/2017  There are no preventive care reminders to display for this patient.    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   COPD 01/08/2010   DEPRESSIVE DISORDER 12/23/2006   FATIGUE 01/08/2010   HYPERLIPIDEMIA 01/08/2010   LOW BACK PAIN 12/23/2006   OAB (overactive bladder) 10/07/2011   OSTEOPOROSIS 01/08/2010   Other specified forms of hearing loss 01/08/2010   PONV (postoperative nausea and vomiting)    SINUSITIS- ACUTE-NOS 07/09/2007   Urticaria    Vitamin D deficiency 04/30/2010   Past Surgical History:  Procedure Laterality Date   ABDOMINAL HYSTERECTOMY     APPENDECTOMY  1988   BREAST BIOPSY  1986   COLON RESECTION N/A 06/03/2013   Procedure: LAPAROSCOPY DIAGNOSTIC and OPEN  SIGMOID COLON RESECTION ;  Surgeon: Axel FillerArmando Ramirez, MD;  Location: [facility];  Service: General;  Laterality: N/A;   COLON SURGERY  2000   colon obstruction   larygenscopy     OOPHORECTOMY  bilat 1988   secondary to cyst    reports that she has been smoking cigarettes. She has a 44.00 pack-year smoking history. She has never used smokeless tobacco. She reports current alcohol use. She reports that she does not use drugs. family history includes Colon cancer in her brother and mother; Diabetes in her maternal aunt and maternal grandmother. Allergies  Allergen Reactions   Codeine     REACTION: nausea   Lortab [Hydrocodone-Acetaminophen] Hives   Penicillins    Current Outpatient Medications on File Prior to Visit  Medication Sig Dispense Refill   amLODipine (NORVASC)  5 MG tablet Take 1 tablet (5 mg total) by mouth daily. 90 tablet 3   aspirin EC 81 MG tablet Take 1 tablet (81 mg total) by mouth daily. Swallow whole. 90 tablet 3   Cholecalciferol (VITAMIN D-3 PO) Take by mouth daily in the afternoon.     Coenzyme Q10 (COQ10) 100 MG CAPS Take 3 capsules by mouth daily. 30 capsule    levocetirizine (XYZAL) 5 MG tablet Take 5 mg by mouth every evening.     metoprolol succinate (TOPROL-XL) 25 MG 24 hr tablet Take 0.5 tablets (12.5 mg total) by mouth daily. Take with  or immediately following a meal. 45 tablet 2   omeprazole (PRILOSEC) 10 MG capsule Take 10 mg by mouth as needed.     polyethylene glycol (MIRALAX / GLYCOLAX) 17 g packet Take 17 g by mouth daily.     rosuvastatin (CRESTOR) 20 MG tablet Take 1 tablet (20 mg total) by mouth daily. 90 tablet 1   Saline 0.65 % (Soln) SOLN Place 1 spray into the nose as needed.     temazepam (RESTORIL) 15 MG capsule 1-2 tab by mouth at bedtime as needed for sleep 60 capsule 5   nitroGLYCERIN (NITROSTAT) 0.4 MG SL tablet Place 1 tablet (0.4 mg total) under the tongue every 5 (five) minutes as needed for chest pain. 25 tablet 3   No current facility-administered medications on file prior to visit.        ROS:  All others reviewed and negative.  Objective        PE:  BP 114/60 (BP Location: Right Arm, Patient Position: Sitting, Cuff Size: Normal)    Pulse 90    Temp 98.2 F (36.8 C) (Oral)    Ht 5\' 5"  (1.651 m)    Wt 128 lb (58.1 kg)    SpO2 95%    BMI 21.30 kg/m                 Constitutional: Pt appears in NAD               HENT: Head: NCAT.                Right Ear: External ear normal.                 Left Ear: External ear normal. Bilat tm's with mild erythema and slight effusions left > right,  Max sinus areas non tender.                Eyes: . Pupils are equal, round, and reactive to light. Conjunctivae and EOM are normal               Nose: without d/c or deformity               Neck: Neck supple. Gross normal ROM               Cardiovascular: Normal rate and regular rhythm.                 Pulmonary/Chest: Effort normal and breath sounds without rales or wheezing.                Abd:  Soft, NT, ND, + BS, no organomegaly               Neurological: Pt is alert. At baseline orientation, motor grossly intact without focal findings               Skin: Skin  is warm. No rashes, no other new lesions, LE edema - none               Psychiatric: Pt behavior is normal without agitation   Micro: none  Cardiac  tracings I have personally interpreted today:  none  Pertinent Radiological findings (summarize): none   Lab Results  Component Value Date   WBC 6.2 06/24/2020   HGB 13.1 06/24/2020   HCT 40.4 06/24/2020   PLT 318 06/24/2020   GLUCOSE 85 06/24/2020   CHOL 129 06/05/2020   TRIG 62 06/05/2020   HDL 62 06/05/2020   LDLDIRECT 146.3 10/07/2011   LDLCALC 54 06/05/2020   ALT 16 06/24/2020   AST 25 06/24/2020   NA 135 06/24/2020   K 4.1 06/24/2020   CL 100 06/24/2020   CREATININE 0.73 06/24/2020   BUN 13 06/24/2020   CO2 28 06/24/2020   TSH 1.53 08/16/2019   INR 0.96 05/27/2013   HGBA1C 5.5 08/16/2019   Assessment/Plan:  CHEROKEE BOCCIO is a 76 y.o. White or Caucasian [1] female with  has a past medical history of Asthma-COPD overlap syndrome (HCC) (01/15/2016), COPD (01/08/2010), DEPRESSIVE DISORDER (12/23/2006), FATIGUE (01/08/2010), HYPERLIPIDEMIA (01/08/2010), LOW BACK PAIN (12/23/2006), OAB (overactive bladder) (10/07/2011), OSTEOPOROSIS (01/08/2010), Other specified forms of hearing loss (01/08/2010), PONV (postoperative nausea and vomiting), SINUSITIS- ACUTE-NOS (07/09/2007), Urticaria, and Vitamin D deficiency (04/30/2010).  Vitamin D deficiency Last vitamin D Lab Results  Component Value Date   VD25OH 47 (L) 08/16/2019   Low, to start oral replacement   Encounter for well adult exam with abnormal findings Age and sex appropriate education and counseling updated with regular exercise and diet Referrals for preventative services - none needed Immunizations addressed - declines covid booster, flu shot Smoking counseling  - counseled to quit, pt not ready Evidence for depression or other mood disorder - none significant Most recent labs reviewed. I have personally reviewed and have noted: 1) the patient's medical and social history 2) The patient's current medications and supplements 3) The patient's height, weight, and BMI have been recorded in the  chart   Smoker Counseled to quit, pt not ready  Hyperlipidemia LDL goal <70 Lab Results  Component Value Date   LDLCALC 54 06/05/2020   Stable, pt to continue current statin crestor and f/u lab today    Hyperglycemia Lab Results  Component Value Date   HGBA1C 5.5 08/16/2019   Stable, pt to continue current medical treatment diet   Atherosclerosis of aorta (HCC) Pt to continue crestor, low chol diet, excercise  Allergic rhinitis Mild to mod, seasonal flare,for contd xyzal, nasacort, depomedrol IM 80, mucinex bid prn, and consider meclizine prn,  to f/u any worsening symptoms or concerns  Followup: Return in about 6 months (around 10/09/2021).  Oliver Barre, MD 04/08/2021 12:37 PM [facility]

## 2021-04-08 NOTE — Assessment & Plan Note (Addendum)
Mild to mod, seasonal flare,for contd xyzal, nasacort, depomedrol IM 80, mucinex bid prn, and consider meclizine prn,  to f/u any worsening symptoms or concerns ?

## 2021-04-08 NOTE — Assessment & Plan Note (Signed)
Last vitamin D Lab Results  Component Value Date   VD25OH 28 (L) 08/16/2019   Low, to start oral replacement   

## 2021-04-08 NOTE — Assessment & Plan Note (Signed)
Counseled to quit, pt not ready 

## 2021-04-08 NOTE — Assessment & Plan Note (Signed)
Lab Results  ?Component Value Date  ? LDLCALC 54 06/05/2020  ? ?Stable, pt to continue current statin crestor and f/u lab today ? ? ?

## 2021-04-08 NOTE — Assessment & Plan Note (Signed)
Lab Results  ?Component Value Date  ? HGBA1C 5.5 08/16/2019  ? ?Stable, pt to continue current medical treatment diet ? ?

## 2021-04-08 NOTE — Patient Instructions (Signed)
You had the steroid shot today ? ?You can also take Mucinex (or it's generic off brand) for congestion, and tylenol as needed for pain. ? ?Please continue all other medications as before, including the xyzal and nasacort. ? ?Please have the pharmacy call with any other refills you may need. ? ?Please continue your efforts at being more active, low cholesterol diet, and weight control. ? ?You are otherwise up to date with prevention measures today. ? ?Please keep your appointments with your specialists as you may have planned ? ?Please go to the LAB at the blood drawing area for the tests to be done ? ?You will be contacted by phone if any changes need to be made immediately.  Otherwise, you will receive a letter about your results with an explanation, but please check with MyChart first. ? ?Please remember to sign up for MyChart if you have not done so, as this will be important to you in the future with finding out test results, communicating by private email, and scheduling acute appointments online when needed. ? ?Please make an Appointment to return in 6 months, or sooner if needed ?

## 2021-04-08 NOTE — Assessment & Plan Note (Signed)
Pt to continue crestor, low chol diet, excercise 

## 2021-04-30 ENCOUNTER — Encounter: Payer: Self-pay | Admitting: Cardiovascular Disease

## 2021-04-30 ENCOUNTER — Other Ambulatory Visit: Payer: Self-pay

## 2021-04-30 MED ORDER — NITROGLYCERIN 0.4 MG SL SUBL: SUBLINGUAL_TABLET | SUBLINGUAL | 3 refills | Status: DC

## 2021-04-30 MED ORDER — ROSUVASTATIN CALCIUM 20 MG PO TABS: 20.0000 mg | ORAL_TABLET | Freq: Every day | ORAL | 1 refills | Status: DC

## 2021-07-08 ENCOUNTER — Ambulatory Visit (INDEPENDENT_AMBULATORY_CARE_PROVIDER_SITE_OTHER): Payer: Medicare PPO

## 2021-07-08 ENCOUNTER — Other Ambulatory Visit: Payer: Self-pay

## 2021-07-08 ENCOUNTER — Encounter (HOSPITAL_COMMUNITY): Payer: Self-pay

## 2021-07-08 ENCOUNTER — Ambulatory Visit (HOSPITAL_COMMUNITY)
Admission: RE | Admit: 2021-07-08 | Discharge: 2021-07-08 | Disposition: A | Discharge status: Home / Self Care | Payer: Medicare PPO | Source: Ambulatory Visit | Attending: Family Medicine | Admitting: Family Medicine

## 2021-07-08 ENCOUNTER — Emergency Department (HOSPITAL_COMMUNITY)
Admission: EM | Admit: 2021-07-08 | Discharge: 2021-07-09 | Disposition: A | Discharge status: Home / Self Care | Payer: Medicare PPO | Attending: Emergency Medicine | Admitting: Emergency Medicine

## 2021-07-08 ENCOUNTER — Emergency Department (HOSPITAL_COMMUNITY): Payer: Medicare PPO

## 2021-07-08 VITALS — BP 123/75 | HR 85 | Temp 98.1°F | Resp 18

## 2021-07-08 DIAGNOSIS — R9431 Abnormal electrocardiogram [ECG] [EKG]: Secondary | ICD-10-CM | POA: Diagnosis not present

## 2021-07-08 DIAGNOSIS — K760 Fatty (change of) liver, not elsewhere classified: Secondary | ICD-10-CM | POA: Diagnosis not present

## 2021-07-08 DIAGNOSIS — M25522 Pain in left elbow: Secondary | ICD-10-CM | POA: Diagnosis not present

## 2021-07-08 DIAGNOSIS — R109 Unspecified abdominal pain: Secondary | ICD-10-CM | POA: Insufficient documentation

## 2021-07-08 DIAGNOSIS — R197 Diarrhea, unspecified: Secondary | ICD-10-CM | POA: Diagnosis not present

## 2021-07-08 DIAGNOSIS — Z7982 Long term (current) use of aspirin: Secondary | ICD-10-CM | POA: Diagnosis not present

## 2021-07-08 DIAGNOSIS — N281 Cyst of kidney, acquired: Secondary | ICD-10-CM | POA: Diagnosis not present

## 2021-07-08 DIAGNOSIS — R1084 Generalized abdominal pain: Secondary | ICD-10-CM | POA: Diagnosis not present

## 2021-07-08 DIAGNOSIS — K5900 Constipation, unspecified: Secondary | ICD-10-CM | POA: Diagnosis not present

## 2021-07-08 DIAGNOSIS — R7401 Elevation of levels of liver transaminase levels: Secondary | ICD-10-CM | POA: Diagnosis not present

## 2021-07-08 DIAGNOSIS — R110 Nausea: Secondary | ICD-10-CM | POA: Insufficient documentation

## 2021-07-08 DIAGNOSIS — R142 Eructation: Secondary | ICD-10-CM | POA: Diagnosis not present

## 2021-07-08 DIAGNOSIS — S59902A Unspecified injury of left elbow, initial encounter: Secondary | ICD-10-CM | POA: Diagnosis not present

## 2021-07-08 DIAGNOSIS — M7989 Other specified soft tissue disorders: Secondary | ICD-10-CM | POA: Diagnosis not present

## 2021-07-08 LAB — CBC
HCT: 45.7 % (ref 36.0–46.0)
Hemoglobin: 15 g/dL (ref 12.0–15.0)
MCH: 31.4 pg (ref 26.0–34.0)
MCHC: 32.8 g/dL (ref 30.0–36.0)
MCV: 95.8 fL (ref 80.0–100.0)
Platelets: 316 10*3/uL (ref 150–400)
RBC: 4.77 MIL/uL (ref 3.87–5.11)
RDW: 12.9 % (ref 11.5–15.5)
WBC: 9.7 10*3/uL (ref 4.0–10.5)
nRBC: 0 % (ref 0.0–0.2)

## 2021-07-08 LAB — LIPASE, BLOOD: Lipase: 31 U/L (ref 11–51)

## 2021-07-08 LAB — COMPREHENSIVE METABOLIC PANEL
ALT: 111 U/L — ABNORMAL HIGH (ref 0–44)
AST: 92 U/L — ABNORMAL HIGH (ref 15–41)
Albumin: 4.5 g/dL (ref 3.5–5.0)
Alkaline Phosphatase: 63 U/L (ref 38–126)
Anion gap: 10 (ref 5–15)
BUN: 10 mg/dL (ref 8–23)
CO2: 27 mmol/L (ref 22–32)
Calcium: 9.4 mg/dL (ref 8.9–10.3)
Chloride: 99 mmol/L (ref 98–111)
Creatinine, Ser: 0.72 mg/dL (ref 0.44–1.00)
GFR, Estimated: >60 mL/min (ref >60)
Glucose, Bld: 128 mg/dL — ABNORMAL HIGH (ref 70–99)
Potassium: 4.6 mmol/L (ref 3.5–5.1)
Sodium: 136 mmol/L (ref 135–145)
Total Bilirubin: 0.8 mg/dL (ref 0.3–1.2)
Total Protein: 7.5 g/dL (ref 6.5–8.1)

## 2021-07-08 LAB — URINALYSIS, ROUTINE W REFLEX MICROSCOPIC
Bilirubin Urine: NEGATIVE
Glucose, UA: NEGATIVE mg/dL
Hgb urine dipstick: NEGATIVE
Ketones, ur: NEGATIVE mg/dL
Nitrite: NEGATIVE
Protein, ur: NEGATIVE mg/dL
Specific Gravity, Urine: 1.008 (ref 1.005–1.030)
pH: 6 (ref 5.0–8.0)

## 2021-07-08 MED ORDER — LACTATED RINGERS IV BOLUS
1000.0000 mL | Freq: Once | INTRAVENOUS | Status: AC
  Administered 2021-07-08: 1000 mL via INTRAVENOUS

## 2021-07-08 MED ORDER — IOHEXOL 300 MG/ML  SOLN
100.0000 mL | Freq: Once | INTRAMUSCULAR | Status: AC | PRN
  Administered 2021-07-08: 100 mL via INTRAVENOUS

## 2021-07-08 MED ORDER — ONDANSETRON 4 MG PO TBDP
4.0000 mg | ORAL_TABLET | Freq: Once | ORAL | Status: AC | PRN
  Administered 2021-07-08: 4 mg via ORAL
  Filled 2021-07-08: qty 1

## 2021-07-08 NOTE — ED Notes (Signed)
Patient transported to CT 

## 2021-07-08 NOTE — ED Provider Triage Note (Signed)
Emergency Medicine Provider Triage Evaluation Note  Michelle Hubbard , a 76 y.o. female  was evaluated in triage.  Pt complains of intermittent abdominal pain has become constant over the last week.  Endorses nausea with intermittent constipation and diarrhea.  Denies vomiting.  Mild urinary symptoms of discomfort.  Denies fever or recent illness.  Denies bloody stools.  Review of Systems  Positive:  Negative: As above  Physical Exam  BP 115/62   Pulse 87   Temp 97.7 F (36.5 C) (Oral)   Resp 16   SpO2 94%  Gen:   Awake, no distress   Resp:  Normal effort  MSK:   Moves extremities without difficulty  Other:  Abdomen soft with mild suprapubic tenderness.  No flank pain.  Afebrile  Medical Decision Making  Medically screening exam initiated at 3:41 PM.  Appropriate orders placed.  [name] was informed that the remainder of the evaluation will be completed by another provider, this initial triage assessment does not replace that evaluation, and the importance of remaining in the ED until their evaluation is complete.     Cecil Cobbs, PA-C 07/08/21 1542

## 2021-07-08 NOTE — ED Triage Notes (Signed)
Pt from UC for eval of three months of intermittent abdominal pain with nausea without vomiting and diarrhea. While pain has been intermittent for months, it has been almost constant x 1 week. UC believes she may have a "dilated colon" and was sent here for a CT scan and labs.

## 2021-07-08 NOTE — ED Triage Notes (Signed)
Pt states abdominal pain, belching, and nausea since last week. Pt states swelling in her stomach region. Pt states she fell on concrete floor and hit both of her elbows. Pt states she did not hit her head. Pt states she took Tylenol this morning.

## 2021-07-08 NOTE — Discharge Instructions (Addendum)
Please proceed to the emergency room.   X-rays of your elbow do not show a fracture  Your x-rays of your abdomen did show some dilated colon.

## 2021-07-08 NOTE — ED Provider Notes (Signed)
[facility] EMERGENCY DEPARTMENT Provider Note   CSN: [id number] Arrival date & time: 07/08/21  1337     History  Chief Complaint  Patient presents with   Abdominal Pain    Michelle Hubbard is a 76 y.o. female.  HPI 76 year old female presents with abdominal discomfort, nausea and loose stools.  On and off problem for 3 months except the nausea is new.  These current symptoms have been about 5 days.  No fevers or back pain.  Increased urination but has also been drinking more.  Seems like she has loose stools but she is also had battles with constipation and is on chronic MiraLAX.  She is been backing off of this recently due to the stools.  Her abdomen feels bloated though today things are little better.  Went to urgent care and was sent here.  Home Medications Prior to Admission medications   Medication Sig Start Date End Date Taking? Authorizing Provider  amLODipine (NORVASC) 5 MG tablet Take 1 tablet (5 mg total) by mouth daily. 03/27/21 06/25/21  Croitoru, Mihai, MD  aspirin EC 81 MG tablet Take 1 tablet (81 mg total) by mouth daily. Swallow whole. 11/15/20   Croitoru, Mihai, MD  Cholecalciferol (VITAMIN D-3 PO) Take by mouth daily in the afternoon.    [provider]  Coenzyme Q10 (COQ10) 100 MG CAPS Take 3 capsules by mouth daily. 04/13/20   Croitoru, Mihai, MD  levocetirizine (XYZAL) 5 MG tablet Take 5 mg by mouth every evening.    [provider]  metoprolol succinate (TOPROL-XL) 25 MG 24 hr tablet Take 0.5 tablets (12.5 mg total) by mouth daily. Take with or immediately following a meal. 04/01/21 06/30/21  Croitoru, Rachelle Hora, MD  nitroGLYCERIN (NITROSTAT) 0.4 MG SL tablet 1 tablet under the tongue may be used every 5 minutes as needed, for up to 15 minutes. 04/30/21   Croitoru, Mihai, MD  omeprazole (PRILOSEC) 10 MG capsule Take 10 mg by mouth as needed.    [provider]  polyethylene glycol (MIRALAX / GLYCOLAX) 17 g packet Take 17 g by mouth  daily.    [provider]  rosuvastatin (CRESTOR) 20 MG tablet Take 1 tablet (20 mg total) by mouth daily. 04/30/21   Croitoru, Mihai, MD  Saline 0.65 % (Soln) SOLN Place 1 spray into the nose as needed.    [provider]  temazepam (RESTORIL) 15 MG capsule 1-2 tab by mouth at bedtime as needed for sleep 03/05/21   Corwin Levins, MD      Allergies    Codeine, Lortab [hydrocodone-acetaminophen], and Penicillins    Review of Systems   Review of Systems  Gastrointestinal:  Positive for abdominal distention, abdominal pain, diarrhea and nausea.    Physical Exam Updated Vital Signs BP 125/76   Pulse 81   Temp 98.1 F (36.7 C) (Oral)   Resp (!) 25   SpO2 93%  Physical Exam Vitals and nursing note reviewed.  Constitutional:      Appearance: She is well-developed.  HENT:     Head: Normocephalic and atraumatic.  Cardiovascular:     Rate and Rhythm: Normal rate and regular rhythm.     Heart sounds: Normal heart sounds.  Pulmonary:     Effort: Pulmonary effort is normal.     Breath sounds: Normal breath sounds.  Abdominal:     General: There is no distension.     Palpations: Abdomen is soft.     Tenderness: There  is no abdominal tenderness.  Skin:    General: Skin is warm and dry.  Neurological:     Mental Status: She is alert.     ED Results / Procedures / Treatments   Labs (all labs ordered are listed, but only abnormal results are displayed) Labs Reviewed  COMPREHENSIVE METABOLIC PANEL - Abnormal; Notable for the following components:      Result Value   Glucose, Bld 128 (*)    AST 92 (*)    ALT 111 (*)    All other components within normal limits  URINALYSIS, ROUTINE W REFLEX MICROSCOPIC - Abnormal; Notable for the following components:   Leukocytes,Ua TRACE (*)    Bacteria, UA RARE (*)    All other components within normal limits  LIPASE, BLOOD  CBC    EKG None  Radiology DG Elbow Complete Left  Result Date: 07/08/2021 CLINICAL DATA:  Fall  on concrete floor.  Left elbow injury and pain. EXAM: LEFT ELBOW - COMPLETE 3+ VIEW COMPARISON:  None Available. FINDINGS: There is no evidence of fracture, dislocation, or joint effusion. There is no evidence of arthropathy or other focal bone abnormality. Soft tissue swelling is seen overlying the olecranon process. IMPRESSION: Soft tissue swelling overlying the olecranon process. No evidence of fracture or dislocation. Electronically Signed   By: Danae Orleans M.D.   On: 07/08/2021 13:08   DG Abd 2 Views  Result Date: 07/08/2021 CLINICAL DATA:  Abdominal bloating, belching and nausea. EXAM: ABDOMEN - 2 VIEW COMPARISON:  None. FINDINGS: Gaseous distention of predominantly colon with a moderate amount of stool. Gas and stool are seen in the rectum. There may be a few scattered air-fluid levels. Gas is seen within predominately nondilated small bowel. Visualized lung bases are clear. IMPRESSION: Probable constipation and colonic ileus. Electronically Signed   By: Leanna Battles M.D.   On: 07/08/2021 13:06    Procedures Procedures    Medications Ordered in ED Medications  ondansetron (ZOFRAN-ODT) disintegrating tablet 4 mg (4 mg Oral Given 07/08/21 1541)  lactated ringers bolus 1,000 mL (0 mLs Intravenous Stopped 07/08/21 2324)    ED Course/ Medical Decision Making/ A&P                           Medical Decision Making Amount and/or Complexity of Data Reviewed External Data Reviewed: radiology and notes. Labs: ordered. Radiology: ordered.  Risk Prescription drug management.   Patient does not have any significant abdominal tenderness now but based on her on-and-off symptoms and the abnormal x-ray I think she will need CTA of the abdomen and pelvis.  White blood cell count is normal.  Mild LFT elevation but not significant.  At this point, we are still waiting on CT.  If this is unremarkable I think she be discharged and follow-up with her gastroenterologist.  Care to Dr.  Bebe Shaggy.        Final Clinical Impression(s) / ED Diagnoses Final diagnoses:  None    Rx / DC Orders ED Discharge Orders     None         Pricilla Loveless, MD 07/08/21 2332

## 2021-07-08 NOTE — ED Provider Notes (Signed)
[facility]    CSN: [id number] Arrival date & time: 07/08/21  1147      History   Chief Complaint Chief Complaint  Patient presents with   Abdominal Pain   Nausea    HPI Michelle Hubbard is a 76 y.o. female.    Abdominal Pain  Here for a 3 to 4-day history of nausea and belching, and diarrhea.  She has not vomited.  The diarrhea will be for 5 times in the morning and then she will do a whole lot more through the day.  Each episode is a small amount.  No blood in the stool noted and no fever  No urinary symptoms.  She has had a history of diverticulitis and did have a partial colectomy done for that about 7 years or more ago.  In the last months she has had repeated episodes of bloating that will improve.  Also last night she was staying with her daughter and in the unfamiliar surroundings she fell onto both elbows.  It was concrete that she fell on.  Her left elbow is swollen and is hurting a good bit.  Her right one is not hurting that much.  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   Atherosclerosis of aorta (HCC) 04/15/2020   Coronary artery calcification seen on CT scan 08/16/2019   Hyperglycemia 08/12/2018   Degenerative arthritis of right knee 08/24/2017   Herpes zoster without complication 12/26/2016   Pulmonary nodules 01/15/2016   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   Adverse food reaction 01/15/2016   CAP (community acquired pneumonia) 11/28/2015   COPD exacerbation (HCC) 11/28/2015   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/28/2015   Dysuria 07/04/2015   Dry mouth 12/28/2014   Bilateral hearing loss 12/20/2013   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   OAB (overactive bladder) 10/07/2011   Encounter for well adult exam with abnormal findings 10/07/2011   Smoker 10/07/2011   Right knee pain 08/15/2011   Chronic insomnia 08/15/2011   Vitamin D deficiency 04/30/2010   Hyperlipidemia LDL goal <70 01/08/2010   Other specified forms of hearing loss 01/08/2010   COPD (chronic obstructive pulmonary disease) (HCC) 01/08/2010   Osteoporosis 01/08/2010   Fatigue 01/08/2010   Depression 12/23/2006   LOW BACK PAIN 12/23/2006    Past Surgical History:  Procedure Laterality Date   ABDOMINAL HYSTERECTOMY     APPENDECTOMY  1988   BREAST BIOPSY  1986   COLON RESECTION N/A 06/03/2013   Procedure: LAPAROSCOPY DIAGNOSTIC and OPEN  SIGMOID COLON RESECTION ;  Surgeon: Axel Filler, MD;  Location: [facility];  Service: General;  Laterality: N/A;   COLON SURGERY  2000   colon obstruction   larygenscopy     OOPHORECTOMY  bilat 1988   secondary to cyst    OB History   No obstetric history on file.  Home Medications    Prior to Admission medications   Medication Sig Start Date End Date Taking? Authorizing Provider  amLODipine (NORVASC) 5 MG tablet Take 1 tablet (5 mg total) by mouth daily. 03/27/21 06/25/21  Croitoru, Mihai, MD  aspirin EC 81 MG tablet Take 1 tablet (81 mg total) by mouth daily. Swallow whole. 11/15/20   Croitoru, Mihai, MD  Cholecalciferol (VITAMIN D-3 PO) Take by mouth daily in the afternoon.    [provider]  Coenzyme Q10 (COQ10) 100 MG CAPS Take 3 capsules by mouth daily. 04/13/20   Croitoru, Mihai, MD  levocetirizine (XYZAL) 5 MG tablet Take 5 mg by mouth every  evening.    [provider]  metoprolol succinate (TOPROL-XL) 25 MG 24 hr tablet Take 0.5 tablets (12.5 mg total) by mouth daily. Take with or immediately following a meal. 04/01/21 06/30/21  Croitoru, Rachelle Hora, MD  nitroGLYCERIN (NITROSTAT) 0.4 MG SL tablet 1 tablet under the tongue may be used every 5 minutes as needed, for up to 15 minutes. 04/30/21   Croitoru, Mihai, MD  omeprazole (PRILOSEC) 10 MG capsule Take 10 mg by mouth as needed.    [provider]  polyethylene glycol (MIRALAX / GLYCOLAX) 17 g packet Take 17 g by mouth daily.    [provider]  rosuvastatin (CRESTOR) 20 MG tablet Take 1 tablet (20 mg total) by mouth daily. 04/30/21   Croitoru, Mihai, MD  Saline 0.65 % (Soln) SOLN Place 1 spray into the nose as needed.    [provider]  temazepam (RESTORIL) 15 MG capsule 1-2 tab by mouth at bedtime as needed for sleep 03/05/21   Corwin Levins, MD    Family History Family History  Problem Relation Age of Onset   Colon cancer Mother    Colon cancer Brother    Diabetes Maternal Aunt    Diabetes Maternal Grandmother    Allergic rhinitis Neg Hx    Angioedema Neg Hx    Asthma Neg Hx    Eczema Neg Hx    Immunodeficiency Neg Hx    Urticaria Neg Hx     Social History Social History   Tobacco Use   Smoking status: Every Day    Packs/day: 1.00    Years: 44.00    Total pack years: 44.00    Types: Cigarettes   Smokeless tobacco: Never   Tobacco comments:    smoking more due to move/ trying to quit  Vaping Use   Vaping Use: Never used  Substance Use Topics   Alcohol use: Yes    Alcohol/week: 0.0 standard drinks of alcohol    Comment: vodka nightly   Drug use: No     Allergies   Codeine, Lortab [hydrocodone-acetaminophen], and Penicillins   Review of Systems Review of Systems  Gastrointestinal:  Positive for abdominal pain.     Physical Exam Triage Vital Signs ED Triage Vitals  Enc Vitals Group     BP 07/08/21 1215 123/75     Pulse  Rate 07/08/21 1215 85     Resp 07/08/21 1215 18     Temp 07/08/21 1215 98.1 F (36.7 C)     Temp Source 07/08/21 1215 Oral     SpO2 07/08/21 1215 90 %     Weight --      Height --      Head Circumference --      Peak Flow --      Pain Score 07/08/21 1213 0     Pain  Loc --      Pain Edu? --      Excl. in GC? --    No data found.  Updated Vital Signs BP 123/75 (BP Location: Left Arm)   Pulse 85   Temp 98.1 F (36.7 C) (Oral)   Resp 18   SpO2 90%   Visual Acuity Right Eye Distance:   Left Eye Distance:   Bilateral Distance:    Right Eye Near:   Left Eye Near:    Bilateral Near:     Physical Exam Vitals reviewed.  Constitutional:      General: She is not in acute distress.    Appearance: She is not toxic-appearing.  HENT:     Mouth/Throat:     Mouth: Mucous membranes are moist.     Pharynx: No oropharyngeal exudate or posterior oropharyngeal erythema.  Eyes:     Extraocular Movements: Extraocular movements intact.     Conjunctiva/sclera: Conjunctivae normal.     Pupils: Pupils are equal, round, and reactive to light.  Cardiovascular:     Rate and Rhythm: Normal rate and regular rhythm.     Heart sounds: No murmur heard. Pulmonary:     Effort: Pulmonary effort is normal. No respiratory distress.     Breath sounds: No wheezing, rhonchi or rales.  Abdominal:     General: Bowel sounds are normal. There is no distension.     Palpations: Abdomen is soft. There is no mass.     Tenderness: There is no abdominal tenderness. There is no guarding.  Musculoskeletal:     Cervical back: Neck supple.     Comments: There is edema of the left elbow around the olecranon process.  The right elbow has a contusion on the olecranon process but no swelling.  Lymphadenopathy:     Cervical: No cervical adenopathy.  Skin:    Capillary Refill: Capillary refill takes less than 2 seconds.     Coloration: Skin is not jaundiced or pale.  Neurological:     General: No focal deficit  present.     Mental Status: She is alert and oriented to person, place, and time.  Psychiatric:        Behavior: Behavior normal.      UC Treatments / Results  Labs (all labs ordered are listed, but only abnormal results are displayed) Labs Reviewed - No data to display  EKG   Radiology DG Elbow Complete Left  Result Date: 07/08/2021 CLINICAL DATA:  Fall on concrete floor.  Left elbow injury and pain. EXAM: LEFT ELBOW - COMPLETE 3+ VIEW COMPARISON:  None Available. FINDINGS: There is no evidence of fracture, dislocation, or joint effusion. There is no evidence of arthropathy or other focal bone abnormality. Soft tissue swelling is seen overlying the olecranon process. IMPRESSION: Soft tissue swelling overlying the olecranon process. No evidence of fracture or dislocation. Electronically Signed   By: Danae Orleans M.D.   On: 07/08/2021 13:08   DG Abd 2 Views  Result Date: 07/08/2021 CLINICAL DATA:  Abdominal bloating, belching and nausea. EXAM: ABDOMEN - 2 VIEW COMPARISON:  None. FINDINGS: Gaseous distention of predominantly colon with a moderate amount of stool. Gas and stool are seen in the rectum. There may be a few scattered air-fluid levels. Gas is seen within predominately nondilated small bowel. Visualized lung bases are clear. IMPRESSION: Probable constipation and colonic ileus. Electronically Signed   By: Leanna Battles M.D.   On: 07/08/2021 13:06    Procedures Procedures (including critical care time)  Medications Ordered in UC Medications - No data to display  Initial Impression / Assessment and Plan / UC Course  I have reviewed the triage vital signs and the nursing notes.  Pertinent labs & imaging results that were available during my care of the patient were reviewed by me and considered in my medical decision making (see chart for details).     Elbow x-ray is negative for fracture  Her flat and upright x- rays show colonic ileus and possibly some air-fluid  levels.  She is going to proceed to the emergency room for higher level of care and evaluation Final Clinical Impressions(s) / UC Diagnoses   Final diagnoses:  Abdominal pain, unspecified abdominal location     Discharge Instructions      Please proceed to the emergency room.   X-rays of your elbow do not show a fracture  Your x-rays of your abdomen did show some dilated colon.     ED Prescriptions   None    PDMP not reviewed this encounter.   Zenia ResidesBanister, Minta Fair K, MD 07/08/21 1321

## 2021-07-09 ENCOUNTER — Ambulatory Visit: Payer: Medicare PPO | Admitting: Internal Medicine

## 2021-07-09 NOTE — ED Provider Notes (Signed)
Patient stable no new complaints.  CT imaging is negative for acute process Patient feels comfortable for discharge.  She has follow-up with GI later this month  EKG Interpretation  Date/Time:  Monday July 08 2021 23:51:24 EDT Ventricular Rate:  81 PR Interval:  146 QRS Duration: 93 QT Interval:  384 QTC Calculation: 446 R Axis:   86 Text Interpretation: Sinus rhythm Atrial premature complex Borderline right axis deviation Confirmed by Zadie Rhine ([id number]) on 07/09/2021 12:14:36 AM          Zadie Rhine, MD 07/09/21 [phone]

## 2021-07-11 DIAGNOSIS — K5900 Constipation, unspecified: Secondary | ICD-10-CM | POA: Diagnosis not present

## 2021-07-11 DIAGNOSIS — R7989 Other specified abnormal findings of blood chemistry: Secondary | ICD-10-CM | POA: Diagnosis not present

## 2021-07-11 DIAGNOSIS — R945 Abnormal results of liver function studies: Secondary | ICD-10-CM | POA: Diagnosis not present

## 2021-08-19 ENCOUNTER — Ambulatory Visit: Payer: Medicare PPO | Admitting: Internal Medicine

## 2021-08-20 ENCOUNTER — Ambulatory Visit (INDEPENDENT_AMBULATORY_CARE_PROVIDER_SITE_OTHER): Payer: Medicare PPO | Admitting: *Deleted

## 2021-08-20 ENCOUNTER — Other Ambulatory Visit: Payer: Self-pay | Admitting: *Deleted

## 2021-08-20 VITALS — BP 118/76 | HR 90 | Temp 98.0°F | Ht 65.0 in | Wt 126.5 lb

## 2021-08-20 DIAGNOSIS — Z0000 Encounter for general adult medical examination without abnormal findings: Secondary | ICD-10-CM

## 2021-08-20 MED ORDER — TEMAZEPAM 15 MG PO CAPS: ORAL_CAPSULE | ORAL | 5 refills | Status: DC

## 2021-08-20 NOTE — Patient Instructions (Signed)

## 2021-08-20 NOTE — Progress Notes (Signed)
Subjective:   Michelle Hubbard is a 76 y.o. female who presents for Medicare Annual (Subsequent) preventive examination.  Review of Systems    Defer to PCP       Objective:    Today's Vitals   08/20/21 1501  BP: 118/76  Pulse: 90  Temp: 98 F (36.7 C)  TempSrc: Oral  SpO2: 93%  Weight: 126 lb 8 oz (57.4 kg)  Height: 5\' 5"  (1.651 m)   Body mass index is 21.05 kg/m.     08/20/2021    3:21 PM 08/20/2020    2:25 PM 06/24/2020    9:56 AM 08/16/2019    1:53 PM 08/12/2018    1:16 PM 07/02/2017    1:28 PM 07/21/2016    1:35 PM  Advanced Directives  Does Patient Have a Medical Advance Directive? Yes Yes Yes Yes Yes Yes No  Type of Advance Directive Healthcare Power of Attorney Living will;Healthcare Power of Attorney Living will [place];Living will [place];Living will [place];Living will   Does patient want to make changes to medical advance directive? Yes (MAU/Ambulatory/Procedural Areas - Information given) No - Patient declined  No - Patient declined     Copy of [place] in Chart?  No - copy requested  No - copy requested No - copy requested No - copy requested     Current Medications (verified) Outpatient Encounter Medications as of 08/20/2021  Medication Sig   aspirin EC 81 MG tablet Take 1 tablet (81 mg total) by mouth daily. Swallow whole.   Cholecalciferol (VITAMIN D-3 PO) Take by mouth daily in the afternoon.   Coenzyme Q10 (COQ10) 100 MG CAPS Take 3 capsules by mouth daily.   levocetirizine (XYZAL) 5 MG tablet Take 5 mg by mouth every evening.   linaclotide (LINZESS) 145 MCG CAPS capsule Take 145 mcg by mouth daily before breakfast.   nitroGLYCERIN (NITROSTAT) 0.4 MG SL tablet 1 tablet under the tongue may be used every 5 minutes as needed, for up to 15 minutes.   omeprazole (PRILOSEC) 10 MG capsule Take 10 mg by mouth as needed.   rosuvastatin (CRESTOR) 20 MG tablet Take 1 tablet (20 mg  total) by mouth daily.   Saline 0.65 % (Soln) SOLN Place 1 spray into the nose as needed.   temazepam (RESTORIL) 15 MG capsule 1-2 tab by mouth at bedtime as needed for sleep   amLODipine (NORVASC) 5 MG tablet Take 1 tablet (5 mg total) by mouth daily.   metoprolol succinate (TOPROL-XL) 25 MG 24 hr tablet Take 0.5 tablets (12.5 mg total) by mouth daily. Take with or immediately following a meal.   polyethylene glycol (MIRALAX / GLYCOLAX) 17 g packet Take 17 g by mouth daily. 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   COPD 01/08/2010   DEPRESSIVE DISORDER 12/23/2006   FATIGUE 01/08/2010   HYPERLIPIDEMIA 01/08/2010   LOW BACK PAIN 12/23/2006   OAB (overactive bladder) 10/07/2011   OSTEOPOROSIS 01/08/2010   Other specified forms of hearing loss 01/08/2010   PONV (postoperative nausea and vomiting)    SINUSITIS- ACUTE-NOS 07/09/2007   Urticaria    Vitamin D deficiency 04/30/2010   Past Surgical History:  Procedure Laterality Date  ABDOMINAL HYSTERECTOMY     APPENDECTOMY  1988   BREAST BIOPSY  1986   COLON RESECTION N/A 06/03/2013   Procedure: LAPAROSCOPY DIAGNOSTIC and OPEN  SIGMOID COLON RESECTION ;  Surgeon: Axel Filler, MD;  Location: [facility];  Service: General;  Laterality: N/A;   COLON SURGERY  2000   colon obstruction   larygenscopy     OOPHORECTOMY  bilat 1988   secondary to cyst   Family History  Problem Relation Age of Onset   Colon cancer Mother    Colon cancer Brother    Diabetes Maternal Aunt    Diabetes Maternal Grandmother    Allergic rhinitis Neg Hx    Angioedema Neg Hx    Asthma Neg Hx    Eczema Neg Hx    Immunodeficiency Neg Hx     Urticaria Neg Hx    Social History   Socioeconomic History   Marital status: Widowed    Spouse name: Not on file   Number of children: 3   Years of education: Not on file   Highest education level: Not on file  Occupational History   Occupation: Retired    Associate Professor: Designer, television/film set [employer]  Tobacco Use   Smoking status: Every Day    Packs/day: 1.00    Years: 44.00    Total pack years: 44.00    Types: Cigarettes   Smokeless tobacco: Never   Tobacco comments:    smoking more due to move/ trying to quit  Vaping Use   Vaping Use: Never used  Substance and Sexual Activity   Alcohol use: Yes    Alcohol/week: 0.0 standard drinks of alcohol    Comment: vodka nightly   Drug use: No   Sexual activity: Not Currently  Other Topics Concern   Not on file  Social History Narrative   Not on file   Social Determinants of Health   Financial Resource Strain: Low Risk  (08/20/2021)   Overall Financial Resource Strain (CARDIA)    Difficulty of Paying Living Expenses: Not hard at all  Food Insecurity: No Food Insecurity (08/20/2021)   Hunger Vital Sign    Worried About Running Out of Food in the Last Year: Never true    Ran Out of Food in the Last Year: Never true  Transportation Needs: No Transportation Needs (08/20/2021)   PRAPARE - Administrator, Civil Service (Medical): No    Lack of Transportation (Non-Medical): No  Physical Activity: Insufficiently Active (08/20/2021)   Exercise Vital Sign    Days of Exercise per Week: 1 day    Minutes of Exercise per Session: 120 min  Stress: No Stress Concern Present (08/20/2021)   [employer] of Occupational Health - Occupational Stress Questionnaire    Feeling of Stress : Only a little  Social Connections: Moderately Isolated (08/20/2021)   Social Connection and Isolation Panel [NHANES]    Frequency of Communication with Friends and Family: More than three times a week    Frequency of Social Gatherings with Friends and Family:  Three times a week    Attends Religious Services: More than 4 times per year    Active Member of Clubs or Organizations: No    Attends Banker Meetings: Never    Marital Status: Widowed    Tobacco Counseling Ready to quit: Not Answered Counseling given: Not Answered Tobacco comments: smoking more due to move/ trying to quit   Clinical Intake:     Pain : No/denies pain  Nutritional Status: BMI of 19-24  Normal Nutritional Risks: None Diabetes: No  How often do you need to have someone help you when you read instructions, pamphlets, or other written materials from your doctor or pharmacy?: 1 - Never What is the last grade level you completed in school?: 12 grade  Diabetic?no  Interpreter Needed?: No Patient Declined Interpreter : No      Activities of Daily Living    08/20/2021    3:19 PM  In your present state of health, do you have any difficulty performing the following activities:  Hearing? 1  Comment patient uses hearing aides  Vision? 0  Difficulty concentrating or making decisions? 0  Walking or climbing stairs? 0  Dressing or bathing? 0  Doing errands, shopping? 0    Patient Care Team: Biagio Borg, MD as PCP - General Croitoru, Dani Gobble, MD as PCP - Cardiology (Cardiology)  Indicate any recent Medical Services you may have received from other than [facility] providers in the past year (date may be approximate).     Assessment:   This is a routine wellness examination for [place].  Hearing/Vision screen No results found.  Dietary issues and exercise activities discussed:     Goals Addressed   None   Depression Screen    08/20/2021    3:14 PM 08/20/2021    3:06 PM 04/08/2021   11:05 AM 08/20/2020    2:43 PM 08/16/2019    1:47 PM 08/12/2018    1:18 PM 07/20/2017    2:00 PM  PHQ 2/9 Scores  PHQ - 2 Score 0 1 0 0 0 0 0  PHQ- 9 Score      1 0    Fall Risk    08/20/2021    3:06 PM 04/08/2021   11:04 AM 08/20/2020    2:58 PM 08/20/2020     2:45 PM 08/16/2019    1:53 PM  Fall Risk   Falls in the past year? 1 1 0 0 0  Number falls in past yr: 0 0 0 0 0  Injury with Fall? 1 0 0 0 0  Risk for fall due to :    No Fall Risks No Fall Risks  Follow up    Falls evaluation completed Falls evaluation completed    [place]:  Any stairs in or around the home? Yes  If so, are there any without handrails? Yes  Home free of loose throw rugs in walkways, pet beds, electrical cords, etc? No  Adequate lighting in your home to reduce risk of falls? Yes   ASSISTIVE DEVICES UTILIZED TO PREVENT FALLS:  Life alert? Yes  Use of a cane, walker or w/c? No  Grab bars in the bathroom? Yes  Shower chair or bench in shower? Yes  Elevated toilet seat or a handicapped toilet? No   TIMED UP AND GO:  Was the test performed? No Length of time to ambulate 10 feet: n/a  sec.   Gait steady and fast with assistive device  Cognitive Function:    08/12/2018    1:20 PM  MMSE - Mini Mental State Exam  Orientation to time 5  Orientation to Place 5  Registration 3  Attention/ Calculation 5  Recall 3  Language- name 2 objects 2  Language- repeat 1  Language- follow 3 step command 3  Language- read & follow direction 1  Write a sentence 1  Copy design 1  Total score 30  08/16/2019    1:55 PM  6CIT Screen  What Year? 0 points  What month? 0 points  What time? 0 points  Count back from 20 0 points  Months in reverse 0 points  Repeat phrase 0 points  Total Score 0 points    Immunizations Immunization History  Administered Date(s) Administered   Influenza Split 10/07/2011   Influenza Whole 01/08/2010   Influenza, High Dose Seasonal PF 11/28/2014, 01/06/2018, 10/27/2019   Influenza,inj,Quad PF,6+ Mos 12/20/2013, 11/28/2015   Influenza-Unspecified 11/27/2017   PFIZER(Purple Top)SARS-COV-2 Vaccination 03/27/2019, 04/20/2019, 01/16/2020, 08/07/2020   Pneumococcal Conjugate-13 12/28/2014    Pneumococcal Polysaccharide-23 01/08/2010   Tdap 10/07/2011   Zoster Recombinat (Shingrix) 05/20/2017, 06/25/2017, 07/20/2017    TDAP status: Up to date  Flu Vaccine status: Up to date  Pneumococcal vaccine status: Completed during today's visit.  Covid-19 vaccine status: Completed vaccines  Qualifies for Shingles Vaccine? Yes   Zostavax completed Yes   Shingrix Completed?: Yes  Screening Tests Health Maintenance  Topic Date Due   COVID-19 Vaccine (5 - Booster for Pfizer series) 10/02/2020   INFLUENZA VACCINE  08/27/2021   TETANUS/TDAP  10/06/2021   MAMMOGRAM  12/28/2021   COLONOSCOPY (Pts 45-37yrs Insurance coverage will need to be confirmed)  05/12/2022   Pneumonia Vaccine 69+ Years old  Completed   DEXA SCAN  Completed   Hepatitis C Screening  Completed   Zoster Vaccines- Shingrix  Completed   HPV VACCINES  Aged Out    Health Maintenance  Health Maintenance Due  Topic Date Due   COVID-19 Vaccine (5 - Booster for Pfizer series) 10/02/2020    Colorectal cancer screening: Type of screening: Colonoscopy. Completed 05/11/2017. Repeat every 5 years  Mammogram status: Completed 12/29/2019. Repeat every year  Bone Density status: Completed 03/06/2010. Results reflect: Bone density results: OSTEOPOROSIS. Repeat every unknown years.  Lung Cancer Screening: (Low Dose CT Chest recommended if Age 67-80 years, 30 pack-year currently smoking OR have quit w/in 15years.) does not qualify.   Lung Cancer Screening Referral: n/a  Additional Screening:  Hepatitis C Screening: does qualify; Completed 12/28/2014  Vision Screening: Recommended annual ophthalmology exams for early detection of glaucoma and other disorders of the eye. Is the patient up to date with their annual eye exam?  Yes  Who is the provider or what is the name of the office in which the patient attends annual eye exams? unknown If pt is not established with a provider, would they like to be referred to a  provider to establish care? No .   Dental Screening: Recommended annual dental exams for proper oral hygiene  Community Resource Referral / Chronic Care Management: CRR required this visit?  No   CCM required this visit?  No      Plan:     I have personally reviewed and noted the following in the patient's chart:   Medical and social history Use of alcohol, tobacco or illicit drugs  Current medications and supplements including opioid prescriptions.  Functional ability and status Nutritional status Physical activity Advanced directives List of other physicians Hospitalizations, surgeries, and ER visits in previous 12 months Vitals Screenings to include cognitive, depression, and falls Referrals and appointments  In addition, I have reviewed and discussed with patient certain preventive protocols, quality metrics, and best practice recommendations. A written personalized care plan for preventive services as well as general preventive health recommendations were provided to patient.     Tamela Oddi, CMA   08/20/2021   Nurse Notes:  Ms.  Bennye Alm , Thank you for taking time to come for your Medicare Wellness Visit. I appreciate your ongoing commitment to your health goals. Please review the following plan we discussed and let me know if I can assist you in the future.   These are the goals we discussed:  Goals      Maintain current health status     Continue to be active exercise, eat healthy, enjoy life and family        This is a list of the screening recommended for you and due dates:  Health Maintenance  Topic Date Due   COVID-19 Vaccine (5 - Booster for Pfizer series) 10/02/2020   Flu Shot  08/27/2021   Tetanus Vaccine  10/06/2021   Mammogram  12/28/2021   Colon Cancer Screening  05/12/2022   Pneumonia Vaccine  Completed   DEXA scan (bone density measurement)  Completed   Hepatitis C Screening: USPSTF Recommendation to screen - Ages 83-79 yo.   Completed   Zoster (Shingles) Vaccine  Completed   HPV Vaccine  Aged Out

## 2021-08-28 DIAGNOSIS — K5900 Constipation, unspecified: Secondary | ICD-10-CM | POA: Diagnosis not present

## 2021-08-28 DIAGNOSIS — R42 Dizziness and giddiness: Secondary | ICD-10-CM | POA: Diagnosis not present

## 2021-09-09 ENCOUNTER — Ambulatory Visit (INDEPENDENT_AMBULATORY_CARE_PROVIDER_SITE_OTHER): Payer: Medicare PPO | Admitting: Internal Medicine

## 2021-09-09 VITALS — BP 120/66 | HR 77 | Temp 97.9°F | Ht 65.0 in | Wt 128.0 lb

## 2021-09-09 DIAGNOSIS — F5104 Psychophysiologic insomnia: Secondary | ICD-10-CM

## 2021-09-09 DIAGNOSIS — R42 Dizziness and giddiness: Secondary | ICD-10-CM | POA: Diagnosis not present

## 2021-09-09 DIAGNOSIS — F17200 Nicotine dependence, unspecified, uncomplicated: Secondary | ICD-10-CM

## 2021-09-09 DIAGNOSIS — H6982 Other specified disorders of Eustachian tube, left ear: Secondary | ICD-10-CM | POA: Diagnosis not present

## 2021-09-09 DIAGNOSIS — K5900 Constipation, unspecified: Secondary | ICD-10-CM | POA: Diagnosis not present

## 2021-09-09 DIAGNOSIS — J309 Allergic rhinitis, unspecified: Secondary | ICD-10-CM | POA: Diagnosis not present

## 2021-09-09 MED ORDER — MECLIZINE HCL 12.5 MG PO TABS: 12.5000 mg | ORAL_TABLET | Freq: Three times a day (TID) | ORAL | 1 refills | Status: DC | PRN

## 2021-09-09 MED ORDER — TEMAZEPAM 30 MG PO CAPS: 30.0000 mg | ORAL_CAPSULE | Freq: Every evening | ORAL | 5 refills | Status: DC | PRN

## 2021-09-09 MED ORDER — PREDNISONE 10 MG PO TABS: ORAL_TABLET | ORAL | 0 refills | Status: DC

## 2021-09-09 NOTE — Progress Notes (Signed)
Patient ID: Michelle Hubbard, female   DOB: February 23, 1945, 76 y.o.   MRN: [id number]        Chief Complaint: follow up allergies, left eustachian tube dysfxn, veritgo, constipation, insomnia       HPI:  Michelle Hubbard is a 76 y.o. female here overall doing ok, but Does have several wks ongoing nasal allergy symptoms with clearish congestion, itch and sneezing, without fever, pain, ST, cough, swelling or wheezing, but does have left ear fullness and vertigo mild intermittent.  ALso constipation mild persistent recurrent in the past month with occasional nausea, no vomiting.  Also not sleeping well, previous meds not working well.  Still smoking, not ready to quit       Wt Readings from Last 3 Encounters:  09/09/21 128 lb (58.1 kg)  08/20/21 126 lb 8 oz (57.4 kg)  04/08/21 128 lb (58.1 kg)   BP Readings from Last 3 Encounters:  09/09/21 120/66  08/20/21 118/76  07/09/21 125/76         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   COPD 01/08/2010   DEPRESSIVE DISORDER 12/23/2006   FATIGUE 01/08/2010   HYPERLIPIDEMIA 01/08/2010   LOW BACK PAIN 12/23/2006   OAB (overactive bladder) 10/07/2011   OSTEOPOROSIS 01/08/2010   Other specified forms of hearing loss 01/08/2010   PONV (postoperative nausea and vomiting)    SINUSITIS- ACUTE-NOS 07/09/2007   Urticaria    Vitamin D deficiency 04/30/2010   Past Surgical History:  Procedure Laterality Date   ABDOMINAL HYSTERECTOMY     APPENDECTOMY  1988   BREAST BIOPSY  1986   COLON RESECTION N/A 06/03/2013   Procedure: LAPAROSCOPY DIAGNOSTIC and OPEN  SIGMOID COLON RESECTION ;  Surgeon: Axel Filler, MD;  Location: [facility];  Service: General;  Laterality: N/A;   COLON SURGERY  2000   colon obstruction   larygenscopy     OOPHORECTOMY  bilat 1988   secondary to cyst    reports that she has been smoking cigarettes. She has a 44.00 pack-year smoking history. She has never used smokeless tobacco. She  reports current alcohol use. She reports that she does not use drugs. family history includes Colon cancer in her brother and mother; Diabetes in her maternal aunt and maternal grandmother. Allergies  Allergen Reactions   Codeine     REACTION: nausea   Lortab [Hydrocodone-Acetaminophen] Hives   Penicillins    Current Outpatient Medications on File Prior to Visit  Medication Sig Dispense Refill   lubiprostone (AMITIZA) 8 MCG capsule 1 capsule with food and water     amLODipine (NORVASC) 5 MG tablet Take 1 tablet (5 mg total) by mouth daily. 90 tablet 3   aspirin EC 81 MG tablet Take 1 tablet (81 mg total) by mouth daily. Swallow whole. 90 tablet 3   Cholecalciferol (VITAMIN D-3 PO) Take by mouth daily in the afternoon.     Coenzyme Q10 (COQ10) 100 MG CAPS Take 3 capsules by mouth daily. 30 capsule    levocetirizine (XYZAL) 5 MG tablet Take 5 mg by mouth every evening.     metoprolol succinate (TOPROL-XL) 25 MG 24 hr tablet Take 0.5 tablets (12.5 mg total) by mouth daily. Take with or immediately following a meal. 45 tablet 2   nitroGLYCERIN (NITROSTAT) 0.4 MG SL tablet 1 tablet under the tongue may be used every 5 minutes as needed, for up to 15 minutes. 25 tablet 3   omeprazole (PRILOSEC) 10 MG capsule  Take 10 mg by mouth as needed.     polyethylene glycol (MIRALAX / GLYCOLAX) 17 g packet Take 17 g by mouth daily. (Patient not taking: Reported on 08/20/2021)     rosuvastatin (CRESTOR) 20 MG tablet Take 1 tablet (20 mg total) by mouth daily. 90 tablet 1   Saline 0.65 % (Soln) SOLN Place 1 spray into the nose as needed.     No current facility-administered medications on file prior to visit.        ROS:  All others reviewed and negative.  Objective        PE:  BP 120/66 (BP Location: Left Arm, Patient Position: Sitting, Cuff Size: Large)   Pulse 77   Temp 97.9 F (36.6 C) (Oral)   Ht 5\' 5"  (1.651 m)   Wt 128 lb (58.1 kg)   SpO2 91%   BMI 21.30 kg/m                 Constitutional:  Pt appears in NAD               HENT: Head: NCAT.                Right Ear: External ear normal.                 Left Ear: External ear normal.  Bilat tm's with mild erythema.  Max sinus areas non tender.  Pharynx with mild erythema, no exudate               Eyes: . Pupils are equal, round, and reactive to light. Conjunctivae and EOM are normal               Nose: without d/c or deformity               Neck: Neck supple. Gross normal ROM               Cardiovascular: Normal rate and regular rhythm.                 Pulmonary/Chest: Effort normal and breath sounds without rales or wheezing.                Abd:  Soft, NT, ND, + BS, no organomegaly               Neurological: Pt is alert. At baseline orientation, motor grossly intact               Skin: Skin is warm. No rashes, no other new lesions, LE edema - none               Psychiatric: Pt behavior is normal without agitation   Micro: none  Cardiac tracings I have personally interpreted today:  none  Pertinent Radiological findings (summarize): none   Lab Results  Component Value Date   WBC 9.7 07/08/2021   HGB 15.0 07/08/2021   HCT 45.7 07/08/2021   PLT 316 07/08/2021   GLUCOSE 128 (H) 07/08/2021   CHOL 138 04/08/2021   TRIG 100.0 04/08/2021   HDL 67.60 04/08/2021   LDLDIRECT 146.3 10/07/2011   LDLCALC 51 04/08/2021   ALT 111 (H) 07/08/2021   AST 92 (H) 07/08/2021   NA 136 07/08/2021   K 4.6 07/08/2021   CL 99 07/08/2021   CREATININE 0.72 07/08/2021   BUN 10 07/08/2021   CO2 27 07/08/2021   TSH 1.55 04/08/2021   INR 0.96 05/27/2013   HGBA1C 6.0 04/08/2021  Assessment/Plan:  Michelle Hubbard is a 76 y.o. White or Caucasian [1] female with  has a past medical history of Asthma-COPD overlap syndrome (HCC) (01/15/2016), COPD (01/08/2010), DEPRESSIVE DISORDER (12/23/2006), FATIGUE (01/08/2010), HYPERLIPIDEMIA (01/08/2010), LOW BACK PAIN (12/23/2006), OAB (overactive bladder) (10/07/2011), OSTEOPOROSIS (01/08/2010), Other  specified forms of hearing loss (01/08/2010), PONV (postoperative nausea and vomiting), SINUSITIS- ACUTE-NOS (07/09/2007), Urticaria, and Vitamin D deficiency (04/30/2010).  Allergic rhinitis Mild to mod seasonal flare, for depomedrol IM 80 mg, prednisone taper,  to f/u any worsening symptoms or concerns  Eustachian tube dysfunction, left Mild to mod, for mucinex bid prn,,  to f/u any worsening symptoms or concerns  Vertigo Mild to mod, for meclizine prn,  to f/u any worsening symptoms or concerns  Constipation Mild to mod, for retry linzess prn, to f/u any worsening symptoms or concerns  Smoker Pt counseled to quit, pt not ready  Chronic insomnia Mod chronic, for temazepam 30 mg qhs prn,  to f/u any worsening symptoms or concerns  Followup: Return if symptoms worsen or fail to improve.  Oliver Barre, MD 09/13/2021 9:37 PM [facility]

## 2021-09-09 NOTE — Patient Instructions (Addendum)
You had the steroid shot today  Please take all new medication as prescribed - the prednisone, and lower dose meclizine for vertigo  Please continue all other medications as before, including the xyzal, and nasacort  Ok to change the temazepam 15 mg to the 30 mg tablet  Please have the pharmacy call with any other refills you may need.  Please continue your efforts at being more active, low cholesterol diet, and weight control.  Please keep your appointments with your specialists as you may have planned

## 2021-09-13 ENCOUNTER — Encounter: Payer: Self-pay | Admitting: Internal Medicine

## 2021-09-13 DIAGNOSIS — K5900 Constipation, unspecified: Secondary | ICD-10-CM | POA: Insufficient documentation

## 2021-09-13 DIAGNOSIS — R42 Dizziness and giddiness: Secondary | ICD-10-CM | POA: Insufficient documentation

## 2021-09-13 DIAGNOSIS — H6982 Other specified disorders of Eustachian tube, left ear: Secondary | ICD-10-CM | POA: Insufficient documentation

## 2021-09-13 NOTE — Assessment & Plan Note (Signed)
Mild to mod seasonal flare, for depomedrol IM 80 mg, prednisone taper, to f/u any worsening symptoms or concerns  

## 2021-09-13 NOTE — Assessment & Plan Note (Signed)
Mild to mod, for retry linzess prn, to f/u any worsening symptoms or concerns

## 2021-09-13 NOTE — Assessment & Plan Note (Signed)
Mild to mod, for mucinex bid prn,,  to f/u any worsening symptoms or concerns

## 2021-09-13 NOTE — Assessment & Plan Note (Signed)
Mod chronic, for temazepam 30 mg qhs prn,  to f/u any worsening symptoms or concerns

## 2021-09-13 NOTE — Assessment & Plan Note (Signed)
Pt counseled to quit, pt not ready 

## 2021-09-13 NOTE — Assessment & Plan Note (Signed)
Mild to mod, for meclizine prn, to f/u any worsening symptoms or concerns 

## 2021-10-09 ENCOUNTER — Ambulatory Visit: Payer: Medicare PPO | Admitting: Internal Medicine

## 2021-10-09 ENCOUNTER — Other Ambulatory Visit: Payer: Self-pay | Admitting: Cardiovascular Disease

## 2021-10-18 DIAGNOSIS — Z01118 Encounter for examination of ears and hearing with other abnormal findings: Secondary | ICD-10-CM | POA: Diagnosis not present

## 2021-10-18 DIAGNOSIS — H903 Sensorineural hearing loss, bilateral: Secondary | ICD-10-CM | POA: Diagnosis not present

## 2021-10-23 DIAGNOSIS — R42 Dizziness and giddiness: Secondary | ICD-10-CM | POA: Diagnosis not present

## 2021-10-23 DIAGNOSIS — I2089 Other forms of angina pectoris: Secondary | ICD-10-CM | POA: Insufficient documentation

## 2021-10-23 DIAGNOSIS — H9313 Tinnitus, bilateral: Secondary | ICD-10-CM | POA: Diagnosis not present

## 2021-10-23 DIAGNOSIS — I10 Essential (primary) hypertension: Secondary | ICD-10-CM | POA: Diagnosis not present

## 2021-10-23 DIAGNOSIS — H903 Sensorineural hearing loss, bilateral: Secondary | ICD-10-CM | POA: Diagnosis not present

## 2021-10-28 ENCOUNTER — Encounter: Payer: Self-pay | Admitting: Cardiovascular Disease

## 2021-10-28 MED ORDER — ROSUVASTATIN CALCIUM 20 MG PO TABS: 20.0000 mg | ORAL_TABLET | Freq: Every day | ORAL | 0 refills | Status: DC

## 2021-12-05 ENCOUNTER — Encounter: Payer: Self-pay | Admitting: Cardiovascular Disease

## 2021-12-05 ENCOUNTER — Ambulatory Visit: Payer: Medicare PPO | Attending: Cardiovascular Disease | Admitting: Cardiovascular Disease

## 2021-12-05 VITALS — BP 114/64 | HR 89 | Ht 65.0 in | Wt 129.6 lb

## 2021-12-05 DIAGNOSIS — F17200 Nicotine dependence, unspecified, uncomplicated: Secondary | ICD-10-CM

## 2021-12-05 DIAGNOSIS — E7800 Pure hypercholesterolemia, unspecified: Secondary | ICD-10-CM

## 2021-12-05 DIAGNOSIS — I25118 Atherosclerotic heart disease of native coronary artery with other forms of angina pectoris: Secondary | ICD-10-CM

## 2021-12-05 DIAGNOSIS — I700 Atherosclerosis of aorta: Secondary | ICD-10-CM | POA: Diagnosis not present

## 2021-12-05 NOTE — Progress Notes (Signed)
Cardiology Office Note:    Date:  12/08/2021   ID:  Michelle Hubbard, DOB [birth year], MRN [id number]  PCP:  Corwin Levins, MD  [facility] Cardiologist:  Thurmon Fair, MD [facility] Electrophysiologist:  None   Referring MD: Corwin Levins, MD   Chief Complaint  Patient presents with   Coronary Artery Disease     History of Present Illness:    Michelle Hubbard is a 76 y.o. female with CAD a hx of smoking and hypercholesterolemia, returning for follow-up.    She is doing quite well.  She has not required sublingual nitroglycerin in about 7 months.  She remains active, taking care of her own household, although she does not participate in any regular organized exercise program.  Has same mild exertional dyspnea.  NYHA functional class II.  Her weight loss has resolved and she has gained back a little weight.  Her BMI remains borderline low at 21.  Unfortunately she continues to smoke about a pack a day, although she is trying to quit (roughly 45-pack-year history of smoking).  Denies palpitations, syncope, focal neurological complaints, claudication, lower extremity edema, orthopnea, PND.  Rarely has wheezing.  She did not tolerate isosorbide, but her angina is well controlled on a combination of metoprolol and amlodipine.  She is taking rosuvastatin 20 mg daily and her most recent LDL cholesterol was 51.  Despite her small body size, her hemoglobin A1c was borderline at 6.0% earlier this year.  She was initially referred after a coronary calcium CT which showed a Agatston score of 100 (91st percentile), the study being performed due to incidental finding of heavy coronary calcification on a screening CT for lung cancer.  She had symptoms of mild stable angina pectoris that she did not recognize as such.  Coronary CT angiography performed in September 2021 showed numerous mild and moderate stenotic lesions, especially in the mid and distal LAD artery and the distal left  circumflex coronary artery (the latter lesion being the only one that appeared to be hemodynamically significant, with CT-FFR 0.73).    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   COPD 01/08/2010   DEPRESSIVE DISORDER 12/23/2006   FATIGUE 01/08/2010   HYPERLIPIDEMIA 01/08/2010   LOW BACK PAIN 12/23/2006   OAB (overactive bladder) 10/07/2011   OSTEOPOROSIS 01/08/2010   Other specified forms of hearing loss 01/08/2010   PONV (postoperative nausea and vomiting)    SINUSITIS- ACUTE-NOS 07/09/2007   Urticaria    Vitamin D deficiency 04/30/2010    Past Surgical History:  Procedure Laterality Date   ABDOMINAL HYSTERECTOMY     APPENDECTOMY  1988   BREAST BIOPSY  1986   COLON RESECTION N/A 06/03/2013   Procedure: LAPAROSCOPY DIAGNOSTIC and OPEN  SIGMOID COLON RESECTION ;  Surgeon: Axel Filler, MD;  Location: [facility];  Service: General;  Laterality: N/A;   COLON SURGERY  2000   colon obstruction   larygenscopy     OOPHORECTOMY  bilat 1988   secondary to cyst    Current Medications: Current Meds  Medication Sig   Acetaminophen (TYLENOL PO) Take 500 mg by mouth daily as needed.   amLODipine (NORVASC) 5 MG tablet Take 1 tablet (5 mg total) by mouth daily.   aspirin EC 81 MG tablet Take 1 tablet (81 mg total) by mouth daily. Swallow whole.   Cholecalciferol (VITAMIN D-3 PO) Take by mouth daily in the afternoon.   Coenzyme Q10 (COQ10) 100 MG CAPS  Take 3 capsules by mouth daily.   diazepam (VALIUM) 2 MG tablet Take 2 mg by mouth 3 (three) times daily as needed.   levocetirizine (XYZAL) 5 MG tablet Take 5 mg by mouth every evening.   metoprolol succinate (TOPROL-XL) 25 MG 24 hr tablet TAKE 1/2 (ONE-HALF) TABLET BY MOUTH ONCE DAILY WITH OR IMMEDIATELY FOLLOWING A MEAL   omeprazole (PRILOSEC) 10 MG capsule Take 20 mg by mouth as needed.   polyethylene glycol (MIRALAX / GLYCOLAX) 17 g packet Take 17 g by mouth daily.   rosuvastatin (CRESTOR) 20 MG tablet  Take 1 tablet (20 mg total) by mouth daily.   Saline 0.65 % (Soln) SOLN Place 1 spray into the nose as needed.   temazepam (RESTORIL) 30 MG capsule Take 1 capsule (30 mg total) by mouth at bedtime as needed for sleep.     Allergies:   Codeine, Lortab [hydrocodone-acetaminophen], and Penicillins   Social History   Socioeconomic History   Marital status: Widowed    Spouse name: Not on file   Number of children: 3   Years of education: Not on file   Highest education level: Not on file  Occupational History   Occupation: Retired    Associate Professor: Designer, television/film set [employer]  Tobacco Use   Smoking status: Every Day    Packs/day: 1.00    Years: 44.00    Total pack years: 44.00    Types: Cigarettes   Smokeless tobacco: Never   Tobacco comments:    smoking more due to move/ trying to quit  Vaping Use   Vaping Use: Never used  Substance and Sexual Activity   Alcohol use: Yes    Alcohol/week: 0.0 standard drinks of alcohol    Comment: vodka nightly   Drug use: No   Sexual activity: Not Currently  Other Topics Concern   Not on file  Social History Narrative   Not on file   Social Determinants of Health   Financial Resource Strain: Low Risk  (08/20/2021)   Overall Financial Resource Strain (CARDIA)    Difficulty of Paying Living Expenses: Not hard at all  Food Insecurity: No Food Insecurity (08/20/2021)   Hunger Vital Sign    Worried About Running Out of Food in the Last Year: Never true    Ran Out of Food in the Last Year: Never true  Transportation Needs: No Transportation Needs (08/20/2021)   PRAPARE - Administrator, Civil Service (Medical): No    Lack of Transportation (Non-Medical): No  Physical Activity: Insufficiently Active (08/20/2021)   Exercise Vital Sign    Days of Exercise per Week: 1 day    Minutes of Exercise per Session: 120 min  Stress: No Stress Concern Present (08/20/2021)   [employer] of Occupational Health - Occupational Stress Questionnaire     Feeling of Stress : Only a little  Social Connections: Moderately Isolated (08/20/2021)   Social Connection and Isolation Panel [NHANES]    Frequency of Communication with Friends and Family: More than three times a week    Frequency of Social Gatherings with Friends and Family: Three times a week    Attends Religious Services: More than 4 times per year    Active Member of Clubs or Organizations: No    Attends Banker Meetings: Never    Marital Status: Widowed     Family History: The patient's family history includes Colon cancer in her brother and mother; Diabetes in her maternal aunt and maternal grandmother. There is  no history of Allergic rhinitis, Angioedema, Asthma, Eczema, Immunodeficiency, or Urticaria.  ROS:   Please see the history of present illness.     All other systems reviewed and are negative.  EKGs/Labs/Other Studies Reviewed:    The following studies were reviewed today: Coronary CT and CT FFR  EKG:  EKG is  ordered today.  Personally reviewed, shows normal sinus rhythm and is a completely normal tracing.  No ischemic repolarization abnormalities, QTC 450 ms  Recent Labs: 04/08/2021: TSH 1.55 07/08/2021: ALT 111; BUN 10; Creatinine, Ser 0.72; Hemoglobin 15.0; Platelets 316; Potassium 4.6; Sodium 136  Recent Lipid Panel    Component Value Date/Time   CHOL 138 04/08/2021 1131   CHOL 129 06/05/2020 0835   TRIG 100.0 04/08/2021 1131   HDL 67.60 04/08/2021 1131   HDL 62 06/05/2020 0835   CHOLHDL 2 04/08/2021 1131   VLDL 20.0 04/08/2021 1131   LDLCALC 51 04/08/2021 1131   LDLCALC 54 06/05/2020 0835   LDLCALC 119 (H) 08/16/2019 1518   LDLDIRECT 146.3 10/07/2011 0919    Physical Exam:    VS:  BP 114/64 (BP Location: Left Arm, Patient Position: Sitting, Cuff Size: Normal)   Pulse 89   Ht 5\' 5"  (1.651 m)   Wt 58.8 kg   SpO2 91%   BMI 21.57 kg/m     Wt Readings from Last 3 Encounters:  12/05/21 58.8 kg  09/09/21 58.1 kg  08/20/21 57.4 kg       General: Alert, oriented x3, no distress, very lean Head: no evidence of trauma, PERRL, EOMI, no exophtalmos or lid lag, no myxedema, no xanthelasma; normal ears, nose and oropharynx Neck: normal jugular venous pulsations and no hepatojugular reflux; brisk carotid pulses without delay and no carotid bruits Chest: clear to auscultation, no signs of consolidation by percussion or palpation, normal fremitus, symmetrical and full respiratory excursions Cardiovascular: normal position and quality of the apical impulse, regular rhythm, normal first and second heart sounds, no murmurs, rubs or gallops Abdomen: no tenderness or distention, no masses by palpation, no abnormal pulsatility or arterial bruits, normal bowel sounds, no hepatosplenomegaly Extremities: no clubbing, cyanosis or edema; 2+ radial, ulnar and brachial pulses bilaterally; 2+ right femoral, posterior tibial and dorsalis pedis pulses; 2+ left femoral, posterior tibial and dorsalis pedis pulses; no subclavian or femoral bruits Neurological: grossly nonfocal Psych: Normal mood and affect    ASSESSMENT:    1. Coronary artery disease of native artery of native heart with stable angina pectoris (HCC)   2. Hypercholesterolemia   3. Smoking   4. Atherosclerosis of aorta (HCC)      PLAN:    In order of problems listed above:  Stable angina pectoris: On medical therapy, since the CT angiography did not show high risk anatomy.  Symptoms abolished with a combination of metoprolol and amlodipine (she did not tolerate long-acting nitrates).  On aspirin and statin. HLP: Very good lipid parameters, continue same statin prescription. Smoking: Discussed smoking cessation at length.  This is a single most important intervention she can do to improve her long-term outlook.  I strongly suspect that her mild exertional dyspnea is related to chronic emphysema. Atherosclerosis of the aorta: Incidentally reported on imaging studies.  Normal  caliber aorta.  No clinical PAD.   Medication Adjustments/Labs and Tests Ordered: Current medicines are reviewed at length with the patient today.  Concerns regarding medicines are outlined above.  No orders of the defined types were placed in this encounter.   No orders of  the defined types were placed in this encounter.    Patient Instructions  Medication Instructions:  The current medical regimen is effective;  continue present plan and medications.  *If you need a refill on your cardiac medications before your next appointment, please call your pharmacy*   Follow-Up: At [facility], you and your health needs are our priority.  As part of our continuing mission to provide you with exceptional heart care, we have created designated Provider Care Teams.  These Care Teams include your primary Cardiologist (physician) and Advanced Practice Providers (APPs -  Physician Assistants and Nurse Practitioners) who all work together to provide you with the care you need, when you need it.  We recommend signing up for the patient portal called "MyChart".  Sign up information is provided on this After Visit Summary.  MyChart is used to connect with patients for Virtual Visits (Telemedicine).  Patients are able to view lab/test results, encounter notes, upcoming appointments, etc.  Non-urgent messages can be sent to your provider as well.   To learn more about what you can do with MyChart, go to [email or web address].    Your next appointment:   12 month(s)  The format for your next appointment:   In Person  Provider:   Thurmon Fair, MD            Signed, Thurmon Fair, MD  12/08/2021 5:16 PM    [facility]

## 2021-12-05 NOTE — Patient Instructions (Signed)

## 2021-12-08 ENCOUNTER — Encounter: Payer: Self-pay | Admitting: Cardiovascular Disease

## 2021-12-27 DIAGNOSIS — K5900 Constipation, unspecified: Secondary | ICD-10-CM | POA: Diagnosis not present

## 2021-12-29 DIAGNOSIS — J40 Bronchitis, not specified as acute or chronic: Secondary | ICD-10-CM | POA: Diagnosis not present

## 2022-01-10 DIAGNOSIS — H9201 Otalgia, right ear: Secondary | ICD-10-CM | POA: Diagnosis not present

## 2022-01-10 DIAGNOSIS — J019 Acute sinusitis, unspecified: Secondary | ICD-10-CM | POA: Diagnosis not present

## 2022-01-22 ENCOUNTER — Other Ambulatory Visit: Payer: Self-pay | Admitting: Cardiovascular Disease

## 2022-01-22 DIAGNOSIS — L298 Other pruritus: Secondary | ICD-10-CM | POA: Diagnosis not present

## 2022-01-22 DIAGNOSIS — R42 Dizziness and giddiness: Secondary | ICD-10-CM | POA: Diagnosis not present

## 2022-01-22 DIAGNOSIS — H9201 Otalgia, right ear: Secondary | ICD-10-CM | POA: Diagnosis not present

## 2022-02-05 DIAGNOSIS — D122 Benign neoplasm of ascending colon: Secondary | ICD-10-CM | POA: Diagnosis not present

## 2022-02-05 DIAGNOSIS — K5730 Diverticulosis of large intestine without perforation or abscess without bleeding: Secondary | ICD-10-CM | POA: Diagnosis not present

## 2022-02-05 DIAGNOSIS — K648 Other hemorrhoids: Secondary | ICD-10-CM | POA: Diagnosis not present

## 2022-02-05 DIAGNOSIS — R194 Change in bowel habit: Secondary | ICD-10-CM | POA: Diagnosis not present

## 2022-02-05 DIAGNOSIS — D123 Benign neoplasm of transverse colon: Secondary | ICD-10-CM | POA: Diagnosis not present

## 2022-02-05 DIAGNOSIS — K5900 Constipation, unspecified: Secondary | ICD-10-CM | POA: Diagnosis not present

## 2022-02-05 LAB — HM COLONOSCOPY

## 2022-02-07 DIAGNOSIS — D122 Benign neoplasm of ascending colon: Secondary | ICD-10-CM | POA: Diagnosis not present

## 2022-02-07 DIAGNOSIS — D123 Benign neoplasm of transverse colon: Secondary | ICD-10-CM | POA: Diagnosis not present

## 2022-02-24 DIAGNOSIS — H0102A Squamous blepharitis right eye, upper and lower eyelids: Secondary | ICD-10-CM | POA: Diagnosis not present

## 2022-02-24 DIAGNOSIS — H40013 Open angle with borderline findings, low risk, bilateral: Secondary | ICD-10-CM | POA: Diagnosis not present

## 2022-02-24 DIAGNOSIS — H02054 Trichiasis without entropian left upper eyelid: Secondary | ICD-10-CM | POA: Diagnosis not present

## 2022-02-24 DIAGNOSIS — H0288B Meibomian gland dysfunction left eye, upper and lower eyelids: Secondary | ICD-10-CM | POA: Diagnosis not present

## 2022-02-24 DIAGNOSIS — H0102B Squamous blepharitis left eye, upper and lower eyelids: Secondary | ICD-10-CM | POA: Diagnosis not present

## 2022-02-24 DIAGNOSIS — H43811 Vitreous degeneration, right eye: Secondary | ICD-10-CM | POA: Diagnosis not present

## 2022-02-24 DIAGNOSIS — H0288A Meibomian gland dysfunction right eye, upper and lower eyelids: Secondary | ICD-10-CM | POA: Diagnosis not present

## 2022-03-12 ENCOUNTER — Encounter: Payer: Self-pay | Admitting: Internal Medicine

## 2022-03-12 MED ORDER — TEMAZEPAM 30 MG PO CAPS: 30.0000 mg | ORAL_CAPSULE | Freq: Every evening | ORAL | 5 refills | Status: DC | PRN

## 2022-03-17 DIAGNOSIS — K5900 Constipation, unspecified: Secondary | ICD-10-CM | POA: Diagnosis not present

## 2022-03-17 DIAGNOSIS — Z86010 Personal history of colonic polyps: Secondary | ICD-10-CM | POA: Diagnosis not present

## 2022-03-26 ENCOUNTER — Other Ambulatory Visit: Payer: Self-pay | Admitting: Cardiovascular Disease

## 2022-04-08 DIAGNOSIS — H0288B Meibomian gland dysfunction left eye, upper and lower eyelids: Secondary | ICD-10-CM | POA: Diagnosis not present

## 2022-04-08 DIAGNOSIS — H0102A Squamous blepharitis right eye, upper and lower eyelids: Secondary | ICD-10-CM | POA: Diagnosis not present

## 2022-04-08 DIAGNOSIS — H0288A Meibomian gland dysfunction right eye, upper and lower eyelids: Secondary | ICD-10-CM | POA: Diagnosis not present

## 2022-04-08 DIAGNOSIS — H0102B Squamous blepharitis left eye, upper and lower eyelids: Secondary | ICD-10-CM | POA: Diagnosis not present

## 2022-04-09 ENCOUNTER — Other Ambulatory Visit: Payer: Self-pay | Admitting: Cardiovascular Disease

## 2022-06-24 ENCOUNTER — Encounter: Payer: Self-pay | Admitting: Cardiovascular Disease

## 2022-06-24 MED ORDER — METOPROLOL SUCCINATE ER 25 MG PO TB24: ORAL_TABLET | ORAL | 0 refills | Status: DC

## 2022-07-08 ENCOUNTER — Encounter: Payer: Medicare PPO | Admitting: Internal Medicine

## 2022-07-17 DIAGNOSIS — H539 Unspecified visual disturbance: Secondary | ICD-10-CM | POA: Diagnosis not present

## 2022-07-29 ENCOUNTER — Ambulatory Visit: Payer: Medicare PPO | Admitting: Internal Medicine

## 2022-07-29 ENCOUNTER — Encounter: Payer: Medicare PPO | Admitting: Internal Medicine

## 2022-07-29 ENCOUNTER — Encounter: Payer: Self-pay | Admitting: Internal Medicine

## 2022-07-29 VITALS — BP 124/76 | HR 79 | Temp 98.6°F | Ht 65.0 in | Wt 124.0 lb

## 2022-07-29 DIAGNOSIS — Z0001 Encounter for general adult medical examination with abnormal findings: Secondary | ICD-10-CM

## 2022-07-29 DIAGNOSIS — R202 Paresthesia of skin: Secondary | ICD-10-CM | POA: Diagnosis not present

## 2022-07-29 DIAGNOSIS — E785 Hyperlipidemia, unspecified: Secondary | ICD-10-CM

## 2022-07-29 DIAGNOSIS — Z1231 Encounter for screening mammogram for malignant neoplasm of breast: Secondary | ICD-10-CM

## 2022-07-29 DIAGNOSIS — Z0000 Encounter for general adult medical examination without abnormal findings: Secondary | ICD-10-CM

## 2022-07-29 DIAGNOSIS — H05239 Hemorrhage of unspecified orbit: Secondary | ICD-10-CM | POA: Insufficient documentation

## 2022-07-29 DIAGNOSIS — F17200 Nicotine dependence, unspecified, uncomplicated: Secondary | ICD-10-CM | POA: Diagnosis not present

## 2022-07-29 DIAGNOSIS — J438 Other emphysema: Secondary | ICD-10-CM | POA: Diagnosis not present

## 2022-07-29 DIAGNOSIS — E538 Deficiency of other specified B group vitamins: Secondary | ICD-10-CM | POA: Diagnosis not present

## 2022-07-29 DIAGNOSIS — K146 Glossodynia: Secondary | ICD-10-CM | POA: Insufficient documentation

## 2022-07-29 DIAGNOSIS — R739 Hyperglycemia, unspecified: Secondary | ICD-10-CM | POA: Diagnosis not present

## 2022-07-29 DIAGNOSIS — E559 Vitamin D deficiency, unspecified: Secondary | ICD-10-CM | POA: Diagnosis not present

## 2022-07-29 DIAGNOSIS — K219 Gastro-esophageal reflux disease without esophagitis: Secondary | ICD-10-CM | POA: Insufficient documentation

## 2022-07-29 LAB — URINALYSIS, ROUTINE W REFLEX MICROSCOPIC
Bilirubin Urine: NEGATIVE
Hgb urine dipstick: NEGATIVE
Ketones, ur: NEGATIVE
Nitrite: NEGATIVE
Specific Gravity, Urine: <=1.005 — AB (ref 1.000–1.030)
Total Protein, Urine: NEGATIVE
Urine Glucose: NEGATIVE
Urobilinogen, UA: 0.2 (ref 0.0–1.0)
pH: 6.5 (ref 5.0–8.0)

## 2022-07-29 LAB — LIPID PANEL
Cholesterol: 131 mg/dL (ref 0–200)
HDL: 67.4 mg/dL (ref >39.00)
LDL Cholesterol: 51 mg/dL (ref 0–99)
NonHDL: 63.85
Total CHOL/HDL Ratio: 2
Triglycerides: 66 mg/dL (ref 0.0–149.0)
VLDL: 13.2 mg/dL (ref 0.0–40.0)

## 2022-07-29 LAB — HEPATIC FUNCTION PANEL
ALT: 13 U/L (ref 0–35)
AST: 19 U/L (ref 0–37)
Albumin: 4.3 g/dL (ref 3.5–5.2)
Alkaline Phosphatase: 58 U/L (ref 39–117)
Bilirubin, Direct: 0.1 mg/dL (ref 0.0–0.3)
Total Bilirubin: 0.4 mg/dL (ref 0.2–1.2)
Total Protein: 7.5 g/dL (ref 6.0–8.3)

## 2022-07-29 LAB — BASIC METABOLIC PANEL
BUN: 13 mg/dL (ref 6–23)
CO2: 28 mEq/L (ref 19–32)
Calcium: 9.8 mg/dL (ref 8.4–10.5)
Chloride: 97 mEq/L (ref 96–112)
Creatinine, Ser: 0.6 mg/dL (ref 0.40–1.20)
GFR: 85.9 mL/min (ref >60.00)
Glucose, Bld: 100 mg/dL — ABNORMAL HIGH (ref 70–99)
Potassium: 4.4 mEq/L (ref 3.5–5.1)
Sodium: 133 mEq/L — ABNORMAL LOW (ref 135–145)

## 2022-07-29 LAB — CBC WITH DIFFERENTIAL/PLATELET
Basophils Absolute: 0 10*3/uL (ref 0.0–0.1)
Basophils Relative: 0.6 % (ref 0.0–3.0)
Eosinophils Absolute: 0.1 10*3/uL (ref 0.0–0.7)
Eosinophils Relative: 0.8 % (ref 0.0–5.0)
HCT: 43.5 % (ref 36.0–46.0)
Hemoglobin: 14.4 g/dL (ref 12.0–15.0)
Lymphocytes Relative: 16 % (ref 12.0–46.0)
Lymphs Abs: 1.1 10*3/uL (ref 0.7–4.0)
MCHC: 33.1 g/dL (ref 30.0–36.0)
MCV: 93.8 fl (ref 78.0–100.0)
Monocytes Absolute: 0.7 10*3/uL (ref 0.1–1.0)
Monocytes Relative: 10.1 % (ref 3.0–12.0)
Neutro Abs: 4.9 10*3/uL (ref 1.4–7.7)
Neutrophils Relative %: 72.5 % (ref 43.0–77.0)
Platelets: 341 10*3/uL (ref 150.0–400.0)
RBC: 4.64 Mil/uL (ref 3.87–5.11)
RDW: 13 % (ref 11.5–15.5)
WBC: 6.7 10*3/uL (ref 4.0–10.5)

## 2022-07-29 LAB — HEMOGLOBIN A1C: Hgb A1c MFr Bld: 5.9 % (ref 4.6–6.5)

## 2022-07-29 LAB — VITAMIN B12: Vitamin B-12: 264 pg/mL (ref 211–911)

## 2022-07-29 LAB — TSH: TSH: 1.79 u[IU]/mL (ref 0.35–5.50)

## 2022-07-29 LAB — VITAMIN D 25 HYDROXY (VIT D DEFICIENCY, FRACTURES): VITD: 28.68 ng/mL — ABNORMAL LOW (ref 30.00–100.00)

## 2022-07-29 NOTE — Progress Notes (Unsigned)
Patient ID: Michelle Hubbard, female   DOB: 31-Oct-1945, 77 y.o.   MRN: [id number]         Chief Complaint:: wellness exam and Annual Exam (Went to heart doctor in Oct and they were concerned with her A1c , also been having numbness in right foot moving up into leg has gotten worse over the past couple of years )  , hyperglycemia, hld, low vit d, smoker, copd       HPI:  Michelle Hubbard is a 77 y.o. female here for wellness exam; declines tdap and covid booster, has mammogram on July 10, o/w up to date                        Also Pt denies chest pain, increased sob or doe, wheezing, orthopnea, PND, increased LE swelling, palpitations, dizziness or syncope.   Pt denies polydipsia, polyuria, or new focal neuro s/s, except for worsening 3-4 mo new bilateral paresthesias without worsening lbp, though has chronic recurring back pain as well.    Pt denies fever, wt loss, night sweats, loss of appetite, or other constitutional symptoms     Wt Readings from Last 3 Encounters:  07/29/22 124 lb (56.2 kg)  12/05/21 129 lb 9.6 oz (58.8 kg)  09/09/21 128 lb (58.1 kg)   BP Readings from Last 3 Encounters:  07/29/22 124/76  12/05/21 114/64  09/09/21 120/66   Immunization History  Administered Date(s) Administered   Fluad Quad(high Dose 65+) 11/18/2021   Influenza Inj Mdck Quad With Preservative 10/24/2016   Influenza Split 10/07/2011, 12/05/2015   Influenza Whole 01/08/2010   Influenza, High Dose Seasonal PF 11/28/2014, 01/06/2018, 11/17/2018, 10/27/2019   Influenza,inj,Quad PF,6+ Mos 12/20/2013, 11/28/2015   Influenza-Unspecified 11/27/2017   PFIZER(Purple Top)SARS-COV-2 Vaccination 03/27/2019, 04/20/2019, 01/16/2020, 08/07/2020   Pneumococcal Conjugate-13 12/28/2014   Pneumococcal Polysaccharide-23 01/08/2010   Tdap 10/07/2011   Zoster Recombinant(Shingrix) 04/23/2017, 05/20/2017, 06/25/2017, 07/20/2017   Health Maintenance Due  Topic Date Due   Lung Cancer Screening  10/23/2020    DTaP/Tdap/Td (2 - Td or Tdap) 10/06/2021   MAMMOGRAM  12/28/2021   Medicare Annual Wellness (AWV)  08/21/2022      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   COPD 01/08/2010   DEPRESSIVE DISORDER 12/23/2006   FATIGUE 01/08/2010   HYPERLIPIDEMIA 01/08/2010   LOW BACK PAIN 12/23/2006   OAB (overactive bladder) 10/07/2011   OSTEOPOROSIS 01/08/2010   Other specified forms of hearing loss 01/08/2010   PONV (postoperative nausea and vomiting)    SINUSITIS- ACUTE-NOS 07/09/2007   Urticaria    Vitamin D deficiency 04/30/2010   Past Surgical History:  Procedure Laterality Date   ABDOMINAL HYSTERECTOMY     APPENDECTOMY  1988   BREAST BIOPSY  1986   COLON RESECTION N/A 06/03/2013   Procedure: LAPAROSCOPY DIAGNOSTIC and OPEN  SIGMOID COLON RESECTION ;  Surgeon: Axel Filler, MD;  Location: [facility];  Service: General;  Laterality: N/A;   COLON SURGERY  2000   colon obstruction   larygenscopy     OOPHORECTOMY  bilat 1988   secondary to cyst    reports that she has been smoking cigarettes. She has a 44.00 pack-year smoking history. She has never used smokeless tobacco. She reports current alcohol use. She reports that she does not use drugs. family history includes Colon cancer in her brother and mother; Diabetes in her maternal aunt and maternal grandmother. Allergies  Allergen Reactions   Codeine  REACTION: nausea   Lortab [Hydrocodone-Acetaminophen] Hives   Penicillins    Current Outpatient Medications on File Prior to Visit  Medication Sig Dispense Refill   Acetaminophen (TYLENOL PO) Take 500 mg by mouth daily as needed.     amLODipine (NORVASC) 5 MG tablet Take 1 tablet by mouth once daily 90 tablet 2   aspirin EC 81 MG tablet Take 1 tablet (81 mg total) by mouth daily. Swallow whole. 90 tablet 3   Cholecalciferol (VITAMIN D-3 PO) Take by mouth daily in the afternoon.     Coenzyme Q10 (COQ10) 100 MG CAPS Take 3 capsules by mouth daily. 30  capsule    diazepam (VALIUM) 2 MG tablet Take 2 mg by mouth 3 (three) times daily as needed.     levocetirizine (XYZAL) 5 MG tablet Take 5 mg by mouth every evening.     metoprolol succinate (TOPROL-XL) 25 MG 24 hr tablet TAKE 1/2 (ONE-HALF) TABLET BY MOUTH ONCE DAILY WITH OR IMMEDIATELY FOLLOWING A MEAL 45 tablet 0   nitroGLYCERIN (NITROSTAT) 0.4 MG SL tablet 1 tablet under the tongue may be used every 5 minutes as needed, for up to 15 minutes. 25 tablet 3   omeprazole (PRILOSEC) 10 MG capsule Take 20 mg by mouth as needed.     polyethylene glycol (MIRALAX / GLYCOLAX) 17 g packet Take 17 g by mouth daily.     rosuvastatin (CRESTOR) 20 MG tablet Take 1 tablet by mouth once daily 90 tablet 3   Saline 0.65 % (Soln) SOLN Place 1 spray into the nose as needed.     temazepam (RESTORIL) 30 MG capsule Take 1 capsule (30 mg total) by mouth at bedtime as needed for sleep. 30 capsule 5   No current facility-administered medications on file prior to visit.        ROS:  All others reviewed and negative.  Objective        PE:  BP 124/76 (BP Location: Left Arm, Patient Position: Sitting, Cuff Size: Normal)   Pulse 79   Temp 98.6 F (37 C) (Oral)   Ht 5\' 5"  (1.651 m)   Wt 124 lb (56.2 kg)   SpO2 96%   BMI 20.63 kg/m                 Constitutional: Pt appears in NAD               HENT: Head: NCAT.                Right Ear: External ear normal.                 Left Ear: External ear normal.                Eyes: . Pupils are equal, round, and reactive to light. Conjunctivae and EOM are normal               Nose: without d/c or deformity               Neck: Neck supple. Gross normal ROM               Cardiovascular: Normal rate and regular rhythm.                 Pulmonary/Chest: Effort normal and breath sounds without rales or wheezing.                Abd:  Soft, NT, ND, + BS, no organomegaly  Neurological: Pt is alert. At baseline orientation, motor grossly intact, has decreased  sensation to LE to LT to knees               Skin: Skin is warm. No rashes, no other new lesions, LE edema - none               Psychiatric: Pt behavior is normal without agitation   Micro: none  Cardiac tracings I have personally interpreted today:  none  Pertinent Radiological findings (summarize): none   Lab Results  Component Value Date   WBC 6.7 07/29/2022   HGB 14.4 07/29/2022   HCT 43.5 07/29/2022   PLT 341.0 07/29/2022   GLUCOSE 100 (H) 07/29/2022   CHOL 131 07/29/2022   TRIG 66.0 07/29/2022   HDL 67.40 07/29/2022   LDLDIRECT 146.3 10/07/2011   LDLCALC 51 07/29/2022   ALT 13 07/29/2022   AST 19 07/29/2022   NA 133 (L) 07/29/2022   K 4.4 07/29/2022   CL 97 07/29/2022   CREATININE 0.60 07/29/2022   BUN 13 07/29/2022   CO2 28 07/29/2022   TSH 1.79 07/29/2022   INR 0.96 05/27/2013   HGBA1C 5.9 07/29/2022   Assessment/Plan:  ALAYCIA KOKER is a 77 y.o. White or Caucasian [1] female with  has a past medical history of Asthma-COPD overlap syndrome (01/15/2016), COPD (01/08/2010), DEPRESSIVE DISORDER (12/23/2006), FATIGUE (01/08/2010), HYPERLIPIDEMIA (01/08/2010), LOW BACK PAIN (12/23/2006), OAB (overactive bladder) (10/07/2011), OSTEOPOROSIS (01/08/2010), Other specified forms of hearing loss (01/08/2010), PONV (postoperative nausea and vomiting), SINUSITIS- ACUTE-NOS (07/09/2007), Urticaria, and Vitamin D deficiency (04/30/2010).  Encounter for well adult exam with abnormal findings Age and sex appropriate education and counseling updated with regular exercise and diet Referrals for preventative services - sched for mammogram on July 10 Immunizations addressed - declines tdap and covid booster Smoking counseling  - pt counsled to quit, pt not ready Evidence for depression or other mood disorder - none significant Most recent labs reviewed. I have personally reviewed and have noted: 1) the patient's medical and social history 2) The patient's current medications and  supplements 3) The patient's height, weight, and BMI have been recorded in the chart   COPD (chronic obstructive pulmonary disease) Stable overall, continue inhaler prn  Hyperglycemia Lab Results  Component Value Date   HGBA1C 5.9 07/29/2022   Stable, pt to continue current medical treatment  - diet,wt control   Hyperlipidemia LDL goal <70 Lab Results  Component Value Date   LDLCALC 51 07/29/2022   Stable, pt to continue current statin crestor 20 mg qd   Vitamin D deficiency Last vitamin D Lab Results  Component Value Date   VD25OH 28.68 (L) 07/29/2022   Low, to start oral replacement   Smoker Pt counsled to quit, pt not ready  Bilateral leg paresthesia Probable neuropathy, for neurology referral per pt request  Followup: Return in about 6 months (around 01/29/2023).  Oliver Barre, MD 07/31/2022 7:02 PM [facility]

## 2022-07-29 NOTE — Patient Instructions (Addendum)
You will be contacted regarding the referral for: mammogram  Please have your Tetanus Tdap shots done at your local pharmacy.  Please continue all other medications as before, and refills have been done if requested.  Please have the pharmacy call with any other refills you may need.  Please continue your efforts at being more active, low cholesterol diet, and weight control.  You are otherwise up to date with prevention measures today.  Please keep your appointments with your specialists as you may have planned  You will be contacted regarding the referral for: Neurology  Please go to the LAB at the blood drawing area for the tests to be done  You will be contacted by phone if any changes need to be made immediately.  Otherwise, you will receive a letter about your results with an explanation, but please check with MyChart first.  Please remember to sign up for MyChart if you have not done so, as this will be important to you in the future with finding out test results, communicating by private email, and scheduling acute appointments online when needed.  Please make an Appointment to return in 6 months, or sooner if needed

## 2022-07-31 ENCOUNTER — Encounter: Payer: Self-pay | Admitting: Internal Medicine

## 2022-07-31 DIAGNOSIS — R202 Paresthesia of skin: Secondary | ICD-10-CM | POA: Insufficient documentation

## 2022-07-31 NOTE — Assessment & Plan Note (Signed)
Pt counsled to quit, pt not ready °

## 2022-07-31 NOTE — Assessment & Plan Note (Signed)
Lab Results  Component Value Date   HGBA1C 5.9 07/29/2022   Stable, pt to continue current medical treatment  - diet,wt control

## 2022-07-31 NOTE — Assessment & Plan Note (Signed)
Age and sex appropriate education and counseling updated with regular exercise and diet Referrals for preventative services - sched for mammogram on July 10 Immunizations addressed - declines tdap and covid booster Smoking counseling  - pt counsled to quit, pt not ready Evidence for depression or other mood disorder - none significant Most recent labs reviewed. I have personally reviewed and have noted: 1) the patient's medical and social history 2) The patient's current medications and supplements 3) The patient's height, weight, and BMI have been recorded in the chart

## 2022-07-31 NOTE — Assessment & Plan Note (Signed)
Probable neuropathy, for neurology referral per pt request

## 2022-07-31 NOTE — Assessment & Plan Note (Signed)
Last vitamin D Lab Results  Component Value Date   VD25OH 28.68 (L) 07/29/2022   Low, to start oral replacement

## 2022-07-31 NOTE — Assessment & Plan Note (Signed)
Stable overall, continue inhaler prn 

## 2022-07-31 NOTE — Assessment & Plan Note (Signed)
Lab Results  Component Value Date   LDLCALC 51 07/29/2022   Stable, pt to continue current statin crestor 20 mg qd

## 2022-08-06 ENCOUNTER — Encounter: Payer: Self-pay | Admitting: Neurology

## 2022-08-06 ENCOUNTER — Ambulatory Visit
Admission: RE | Admit: 2022-08-06 | Discharge: 2022-08-06 | Disposition: A | Discharge status: Home / Self Care | Payer: Medicare PPO | Source: Ambulatory Visit | Attending: Internal Medicine | Admitting: Internal Medicine

## 2022-08-06 DIAGNOSIS — Z1231 Encounter for screening mammogram for malignant neoplasm of breast: Secondary | ICD-10-CM

## 2022-08-08 ENCOUNTER — Other Ambulatory Visit: Payer: Self-pay | Admitting: Internal Medicine

## 2022-08-08 DIAGNOSIS — R928 Other abnormal and inconclusive findings on diagnostic imaging of breast: Secondary | ICD-10-CM

## 2022-08-14 ENCOUNTER — Other Ambulatory Visit: Payer: Self-pay | Admitting: Internal Medicine

## 2022-08-14 ENCOUNTER — Ambulatory Visit
Admission: RE | Admit: 2022-08-14 | Discharge: 2022-08-14 | Disposition: A | Discharge status: Home / Self Care | Payer: Medicare PPO | Source: Ambulatory Visit | Attending: Internal Medicine | Admitting: Internal Medicine

## 2022-08-14 DIAGNOSIS — N6320 Unspecified lump in the left breast, unspecified quadrant: Secondary | ICD-10-CM

## 2022-08-14 DIAGNOSIS — R928 Other abnormal and inconclusive findings on diagnostic imaging of breast: Secondary | ICD-10-CM

## 2022-08-14 DIAGNOSIS — N6325 Unspecified lump in the left breast, overlapping quadrants: Secondary | ICD-10-CM | POA: Diagnosis not present

## 2022-08-18 ENCOUNTER — Ambulatory Visit
Admission: RE | Admit: 2022-08-18 | Discharge: 2022-08-18 | Disposition: A | Discharge status: Home / Self Care | Payer: Medicare PPO | Source: Ambulatory Visit | Attending: Internal Medicine | Admitting: Internal Medicine

## 2022-08-18 DIAGNOSIS — N6325 Unspecified lump in the left breast, overlapping quadrants: Secondary | ICD-10-CM | POA: Diagnosis not present

## 2022-08-18 DIAGNOSIS — N6320 Unspecified lump in the left breast, unspecified quadrant: Secondary | ICD-10-CM

## 2022-08-18 DIAGNOSIS — C50812 Malignant neoplasm of overlapping sites of left female breast: Secondary | ICD-10-CM | POA: Diagnosis not present

## 2022-08-18 HISTORY — PX: BREAST BIOPSY: SHX20

## 2022-08-20 ENCOUNTER — Telehealth: Payer: Self-pay | Admitting: Hematology and Oncology

## 2022-08-20 NOTE — Telephone Encounter (Signed)
Spoke to patient to confirm upcoming morning [facility] clinic appointment  on 7/31, paperwork will be sent via e-mail.   Gave location and time, also informed patient that the surgeon's office would be calling as well to get information from them similar to the packet that they will be receiving so make sure to do both.  Reminded patient that all providers will be coming to the clinic to see them HERE and if they had any questions to not hesitate to reach back out to myself or their navigators.

## 2022-08-26 ENCOUNTER — Encounter: Payer: Self-pay | Admitting: *Deleted

## 2022-08-26 DIAGNOSIS — Z170 Estrogen receptor positive status [ER+]: Secondary | ICD-10-CM

## 2022-08-26 NOTE — Progress Notes (Signed)
Radiation Oncology         [phone] ________________________________  Initial Outpatient Consultation  Name: Michelle Hubbard MRN: [id number]  Date: 08/27/2022  DOB: 1946-01-27  WU:[id number], Len Blalock, MD  Harriette Bouillon, MD   REFERRING PHYSICIAN: Harriette Bouillon, MD  DIAGNOSIS: No diagnosis found.   Cancer Staging  No matching staging information was found for the patient.   Stage *** Left Breast UOQ, Invasive Ductal Carcinoma, ER+ / PR- / Her2+, Grade 2  CHIEF COMPLAINT: Here to discuss management of left breast cancer  HISTORY OF PRESENT ILLNESS::Michelle Hubbard is a 77 y.o. female who presented with a left breast abnormality on the following imaging: bilateral screening mammogram on the date of 08/06/22. No symptoms, if any, were reported at that time. Left breast diagnostic mammogram and left breast ultrasound on 08/14/22 further revealed a suspicious mass in the 12 o'clock left breast, 1 cmfn, measuring 1.3 cm. No evidence of left axillary lymphadenopathy was appreciated.   Biopsy of the 12 o'clock left breast on date of 08/18/22 showed grade 2 invasive ductal carcinoma measuring 6 mm in the greatest linear extent of the sample.  ER status: 95% positive with strong staining intensity; PR status 0% negative; Proliferation marker Ki67 at 15%; Her2 status positive; Grade 2. No lymph nodes were examined.   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    PAST SURGICAL HISTORY: Past Surgical History:  Procedure Laterality Date    ABDOMINAL HYSTERECTOMY     APPENDECTOMY  1988   BREAST BIOPSY  1986   BREAST BIOPSY Left 08/18/2022   [place] LT BREAST BX W LOC DEV 1ST LESION IMG BX SPEC US GUIDE 08/18/2022 [facility]   COLON RESECTION N/A 06/03/2013   Procedure: LAPAROSCOPY DIAGNOSTIC and OPEN  SIGMOID COLON RESECTION ;  Surgeon: Axel Filler, MD;  Location: [facility];  Service: General;  Laterality: N/A;   COLON SURGERY  2000   colon obstruction   larygenscopy     OOPHORECTOMY  bilat 1988   secondary to cyst    FAMILY HISTORY: family history includes Colon cancer in her brother and mother; Diabetes in her maternal aunt and maternal grandmother.  SOCIAL HISTORY:  reports that she has been smoking cigarettes. She has a 44 pack-year smoking history. She has never used smokeless tobacco. She reports current alcohol use. She reports that she does not use drugs.  ALLERGIES: Codeine, Lortab [hydrocodone-acetaminophen], and Penicillins  MEDICATIONS:  Current Outpatient Medications  Medication Sig Dispense Refill   Acetaminophen (TYLENOL PO) Take 500 mg by mouth daily as needed.     amLODipine (NORVASC) 5 MG tablet Take 1 tablet by mouth once daily 90 tablet 2   aspirin EC 81 MG tablet Take 1 tablet (81 mg total) by mouth daily. Swallow whole. 90 tablet 3   Cholecalciferol (VITAMIN D-3 PO) Take by mouth daily in the afternoon.     Coenzyme Q10 (COQ10) 100 MG CAPS Take 3 capsules by mouth daily. 30 capsule    diazepam (VALIUM) 2 MG tablet Take 2 mg by mouth 3 (three) times daily as needed.     levocetirizine (XYZAL) 5 MG tablet Take 5 mg by mouth every evening.  metoprolol succinate (TOPROL-XL) 25 MG 24 hr tablet TAKE 1/2 (ONE-HALF) TABLET BY MOUTH ONCE DAILY WITH OR IMMEDIATELY FOLLOWING A MEAL 45 tablet 0   nitroGLYCERIN (NITROSTAT) 0.4 MG SL tablet 1 tablet under the tongue may be used every 5 minutes as needed, for up to 15 minutes. 25 tablet 3   omeprazole (PRILOSEC) 10 MG capsule Take 20 mg by mouth as needed.      polyethylene glycol (MIRALAX / GLYCOLAX) 17 g packet Take 17 g by mouth daily.     rosuvastatin (CRESTOR) 20 MG tablet Take 1 tablet by mouth once daily 90 tablet 3   Saline 0.65 % (Soln) SOLN Place 1 spray into the nose as needed.     temazepam (RESTORIL) 30 MG capsule Take 1 capsule (30 mg total) by mouth at bedtime as needed for sleep. 30 capsule 5   No current facility-administered medications for this encounter.    REVIEW OF SYSTEMS: As above in HPI.   PHYSICAL EXAM:  vitals were not taken for this visit.   General: Alert and oriented, in no acute distress HEENT: Head is normocephalic. Extraocular movements are intact. Oropharynx is clear. Neck: Neck is supple, no palpable cervical or supraclavicular lymphadenopathy. Heart: Regular in rate and rhythm with no murmurs, rubs, or gallops. Chest: Clear to auscultation bilaterally, with no rhonchi, wheezes, or rales. Abdomen: Soft, nontender, nondistended, with no rigidity or guarding. Extremities: No cyanosis or edema. Lymphatics: see Neck Exam Skin: No concerning lesions. Musculoskeletal: symmetric strength and muscle tone throughout. Neurologic: Cranial nerves II through XII are grossly intact. No obvious focalities. Speech is fluent. Coordination is intact. Psychiatric: Judgment and insight are intact. Affect is appropriate. Breasts: *** . No other palpable masses appreciated in the breasts or axillae *** .    ECOG = ***  0 - Asymptomatic (Fully active, able to carry on all predisease activities without restriction)  1 - Symptomatic but completely ambulatory (Restricted in physically strenuous activity but ambulatory and able to carry out work of a light or sedentary nature. For example, light housework, office work)  2 - Symptomatic, <50% in bed during the day (Ambulatory and capable of all self care but unable to carry out any work activities. Up and about more than 50% of waking hours)  3 - Symptomatic, >50% in bed, but  not bedbound (Capable of only limited self-care, confined to bed or chair 50% or more of waking hours)  4 - Bedbound (Completely disabled. Cannot carry on any self-care. Totally confined to bed or chair)  5 - Death   Santiago Glad MM, Creech RH, Tormey DC, et al. [phone]). "Toxicity and response criteria of the [facility]". Am. Evlyn Clines. Oncol. 5 (6): 649-55   LABORATORY DATA:  Lab Results  Component Value Date   WBC 6.7 07/29/2022   HGB 14.4 07/29/2022   HCT 43.5 07/29/2022   MCV 93.8 07/29/2022   PLT 341.0 07/29/2022   CMP     Component Value Date/Time   NA 133 (L) 07/29/2022 1141   NA 136 10/14/2019 1155   K 4.4 07/29/2022 1141   CL 97 07/29/2022 1141   CO2 28 07/29/2022 1141   GLUCOSE 100 (H) 07/29/2022 1141   BUN 13 07/29/2022 1141   BUN 13 10/14/2019 1155   CREATININE 0.60 07/29/2022 1141   CREATININE 0.71 08/16/2019 1518   CALCIUM 9.8 07/29/2022 1141   CALCIUM 9.7 10/07/2011 0919   PROT 7.5 07/29/2022 1141   PROT 7.3 01/24/2020 0816  ALBUMIN 4.3 07/29/2022 1141   ALBUMIN 4.5 01/24/2020 0816   AST 19 07/29/2022 1141   ALT 13 07/29/2022 1141   ALKPHOS 58 07/29/2022 1141   BILITOT 0.4 07/29/2022 1141   BILITOT 0.3 01/24/2020 0816   GFRNONAA >60 07/08/2021 1541   GFRNONAA 83 08/16/2019 1518   GFRAA 95 10/14/2019 1155   GFRAA 97 08/16/2019 1518         RADIOGRAPHY: [place] LT BREAST BX W LOC DEV 1ST LESION IMG BX SPEC US GUIDE  Addendum Date: 08/19/2022   ADDENDUM REPORT: 08/19/2022 12:54 ADDENDUM: Pathology revealed GRADE II INVASIVE DUCTAL CARCINOMA of the LEFT breast, 12 o'clock, 1cmfn, (venus clip). This was found to be concordant by Dr. Bary Richard. Pathology results were discussed with the patient by telephone. The patient reported doing well after the biopsy with tenderness and swelling at the site. Post biopsy instructions and care were reviewed and questions were answered. The patient was encouraged to call [facility] for any additional concerns. My direct phone number was provided. The patient was referred to [facility] on August 27, 2022. Pathology results reported by Rene Kocher, RN on 08/19/2022. Electronically Signed   By: Bary Richard M.D.   On: 08/19/2022 12:54   Result Date: 08/19/2022 CLINICAL DATA:  Patient with a LEFT breast mass presents today for ultrasound-guided core biopsy. EXAM: ULTRASOUND GUIDED LEFT BREAST CORE NEEDLE BIOPSY COMPARISON:  Previous exam(s). PROCEDURE: I met with the patient and we discussed the procedure of ultrasound-guided biopsy, including benefits and alternatives. We discussed the high likelihood of a successful procedure. We discussed the risks of the procedure, including infection, bleeding, tissue injury, clip migration, and inadequate sampling. Informed written consent was given. The usual time-out protocol was performed immediately prior to the procedure. Lesion quadrant: 12 o'clock Using sterile technique and 1% Lidocaine as local anesthetic, under direct ultrasound visualization, a 12 gauge spring-loaded device was used to perform biopsy of the LEFT breast mass at the 12 o'clock axis, 1 cm from the nipple, using a lateral approach. At the conclusion of the procedure, a venus tissue marker clip was deployed into the biopsy cavity. Follow up 2 view mammogram was performed and dictated separately. IMPRESSION: Ultrasound guided biopsy of the LEFT breast mass at the 12 o'clock axis. No apparent complications. Electronically Signed: By: Bary Richard M.D. On: 08/18/2022 13:18   MM CLIP PLACEMENT LEFT  Result Date: 08/18/2022 CLINICAL DATA:  Status post ultrasound-guided biopsy of a LEFT breast mass at the 12 o'clock axis. EXAM: 3D DIAGNOSTIC LEFT MAMMOGRAM POST ULTRASOUND BIOPSY COMPARISON:  Previous exam(s). FINDINGS: 3D Mammographic images were obtained following ultrasound guided biopsy of the LEFT  breast mass at the 12 o'clock axis. The biopsy marking clip is in expected position at the site of biopsy. IMPRESSION: Appropriate positioning of the venus biopsy marking clip at the site of biopsy in the upper LEFT breast corresponding to the targeted mass at the 12 o'clock axis. Final Assessment: Post Procedure Mammograms for Marker Placement Electronically Signed   By: Bary Richard M.D.   On: 08/18/2022 13:39   MM 3D DIAGNOSTIC MAMMOGRAM UNILATERAL LEFT BREAST  Result Date: 08/14/2022 CLINICAL DATA:  Screening recall for possible left breast mass with associated distortion. EXAM: DIGITAL DIAGNOSTIC UNILATERAL LEFT MAMMOGRAM WITH TOMOSYNTHESIS AND CAD; ULTRASOUND LEFT BREAST LIMITED TECHNIQUE: Left digital diagnostic mammography and breast tomosynthesis was performed. The images were evaluated with computer-aided detection. ;  Targeted ultrasound examination of the left breast was performed. COMPARISON:  Previous exam(s). ACR Breast Density Category b: There are scattered areas of fibroglandular density. FINDINGS: Spot compression tomograms were performed upper central left breast demonstrating an irregular spiculated mass measuring 1.1 cm. There is a smaller possibly separate similar appearing mass immediate plea adjacent measuring 0.4 cm. Targeted ultrasound of the left breast was performed. There is an irregular hypoechoic mass in the left breast at 12 o'clock 1 cm from nipple measuring 0.9 x 1 x 1.3 cm. This corresponds well with the mass seen in the left breast at mammography. No abnormal lymph nodes identified in the left axilla. IMPRESSION: Suspicious 1.3 cm mass in the left breast at the 12 o'clock position. RECOMMENDATION: Recommend ultrasound-guided core biopsy of the mass in the left breast at the 12 o'clock position. The findings were discussed with both the patient and her daughter who was present in the room. I have discussed the findings and recommendations with the patient. If applicable, a  reminder letter will be sent to the patient regarding the next appointment. BI-RADS CATEGORY  5: Highly suggestive of malignancy. Electronically Signed   By: Edwin Cap M.D.   On: 08/14/2022 12:11   [place] LIMITED ULTRASOUND INCLUDING AXILLA LEFT BREAST   Result Date: 08/14/2022 CLINICAL DATA:  Screening recall for possible left breast mass with associated distortion. EXAM: DIGITAL DIAGNOSTIC UNILATERAL LEFT MAMMOGRAM WITH TOMOSYNTHESIS AND CAD; ULTRASOUND LEFT BREAST LIMITED TECHNIQUE: Left digital diagnostic mammography and breast tomosynthesis was performed. The images were evaluated with computer-aided detection. ; Targeted ultrasound examination of the left breast was performed. COMPARISON:  Previous exam(s). ACR Breast Density Category b: There are scattered areas of fibroglandular density. FINDINGS: Spot compression tomograms were performed upper central left breast demonstrating an irregular spiculated mass measuring 1.1 cm. There is a smaller possibly separate similar appearing mass immediate plea adjacent measuring 0.4 cm. Targeted ultrasound of the left breast was performed. There is an irregular hypoechoic mass in the left breast at 12 o'clock 1 cm from nipple measuring 0.9 x 1 x 1.3 cm. This corresponds well with the mass seen in the left breast at mammography. No abnormal lymph nodes identified in the left axilla. IMPRESSION: Suspicious 1.3 cm mass in the left breast at the 12 o'clock position. RECOMMENDATION: Recommend ultrasound-guided core biopsy of the mass in the left breast at the 12 o'clock position. The findings were discussed with both the patient and her daughter who was present in the room. I have discussed the findings and recommendations with the patient. If applicable, a reminder letter will be sent to the patient regarding the next appointment. BI-RADS CATEGORY  5: Highly suggestive of malignancy. Electronically Signed   By: Edwin Cap M.D.   On: 08/14/2022 12:11   MM 3D  SCREENING MAMMOGRAM BILATERAL BREAST  Result Date: 08/07/2022 CLINICAL DATA:  Screening. EXAM: DIGITAL SCREENING BILATERAL MAMMOGRAM WITH TOMOSYNTHESIS AND CAD TECHNIQUE: Bilateral screening digital craniocaudal and mediolateral oblique mammograms were obtained. Bilateral screening digital breast tomosynthesis was performed. The images were evaluated with computer-aided detection. COMPARISON:  Previous exam(s). ACR Breast Density Category b: There are scattered areas of fibroglandular density. FINDINGS: In the left breast, a possible mass associated with architectural distortion warrants further evaluation. In the right breast, no findings suspicious for malignancy. IMPRESSION: Further evaluation is suggested for a possible mass in the left breast. RECOMMENDATION: Diagnostic mammogram and possibly ultrasound of the left breast. (Code:[id number]) The patient will be contacted regarding the findings, and additional imaging  will be scheduled. BI-RADS CATEGORY  0: Incomplete: Need additional imaging evaluation. Electronically Signed   By: Hulan Saas M.D.   On: 08/07/2022 15:10      IMPRESSION/PLAN: ***   It was a pleasure meeting the patient today. We discussed the risks, benefits, and side effects of radiotherapy. I recommend radiotherapy to the *** to reduce her risk of locoregional recurrence by 2/3.  We discussed that radiation would take approximately *** weeks to complete and that I would give the patient a few weeks to heal following surgery before starting treatment planning. *** If chemotherapy were to be given, this would precede radiotherapy. We spoke about acute effects including skin irritation and fatigue as well as much less common late effects including internal organ injury or irritation. We spoke about the latest technology that is used to minimize the risk of late effects for patients undergoing radiotherapy to the breast or chest wall. No guarantees of treatment were given. The patient is  enthusiastic about proceeding with treatment. I look forward to participating in the patient's care.  I will await her referral back to me for postoperative follow-up and eventual CT simulation/treatment planning.  On date of service, in total, I spent *** minutes on this encounter. Patient was seen in person.   __________________________________________   Lonie Peak, MD  This document serves as a record of services personally performed by Lonie Peak, MD. It was created on her behalf by Neena Rhymes, a trained medical scribe. The creation of this record is based on the scribe's personal observations and the provider's statements to them. This document has been checked and approved by the attending provider.

## 2022-08-27 ENCOUNTER — Other Ambulatory Visit: Payer: Self-pay

## 2022-08-27 ENCOUNTER — Ambulatory Visit
Admission: RE | Admit: 2022-08-27 | Discharge: 2022-08-27 | Disposition: A | Discharge status: Home / Self Care | Payer: Medicare Other | Source: Ambulatory Visit | Attending: Radiation Oncology | Admitting: Radiation Oncology

## 2022-08-27 ENCOUNTER — Encounter: Payer: Self-pay | Admitting: Radiation Oncology

## 2022-08-27 ENCOUNTER — Encounter: Payer: Self-pay | Admitting: *Deleted

## 2022-08-27 ENCOUNTER — Ambulatory Visit: Payer: Self-pay | Admitting: Surgery

## 2022-08-27 ENCOUNTER — Inpatient Hospital Stay: Payer: Medicare PPO | Attending: Hematology and Oncology

## 2022-08-27 ENCOUNTER — Inpatient Hospital Stay (HOSPITAL_BASED_OUTPATIENT_CLINIC_OR_DEPARTMENT_OTHER): Payer: Medicare PPO | Admitting: Hematology and Oncology

## 2022-08-27 VITALS — BP 127/77 | HR 89 | Temp 98.2°F | Resp 18 | Ht 65.0 in | Wt 123.9 lb

## 2022-08-27 DIAGNOSIS — C50412 Malignant neoplasm of upper-outer quadrant of left female breast: Secondary | ICD-10-CM | POA: Diagnosis not present

## 2022-08-27 DIAGNOSIS — Z90710 Acquired absence of both cervix and uterus: Secondary | ICD-10-CM

## 2022-08-27 DIAGNOSIS — Z170 Estrogen receptor positive status [ER+]: Secondary | ICD-10-CM

## 2022-08-27 DIAGNOSIS — Z800 Family history of malignant neoplasm of digestive organs: Secondary | ICD-10-CM

## 2022-08-27 DIAGNOSIS — Z90722 Acquired absence of ovaries, bilateral: Secondary | ICD-10-CM | POA: Diagnosis not present

## 2022-08-27 DIAGNOSIS — F17210 Nicotine dependence, cigarettes, uncomplicated: Secondary | ICD-10-CM | POA: Diagnosis not present

## 2022-08-27 DIAGNOSIS — C50912 Malignant neoplasm of unspecified site of left female breast: Secondary | ICD-10-CM

## 2022-08-27 LAB — CMP (CANCER CENTER ONLY)
ALT: 12 U/L (ref 0–44)
AST: 18 U/L (ref 15–41)
Albumin: 4.7 g/dL (ref 3.5–5.0)
Alkaline Phosphatase: 61 U/L (ref 38–126)
Anion gap: 6 (ref 5–15)
BUN: 14 mg/dL (ref 8–23)
CO2: 30 mmol/L (ref 22–32)
Calcium: 9.8 mg/dL (ref 8.9–10.3)
Chloride: 100 mmol/L (ref 98–111)
Creatinine: 0.66 mg/dL (ref 0.44–1.00)
GFR, Estimated: >60 mL/min (ref >60)
Glucose, Bld: 83 mg/dL (ref 70–99)
Potassium: 4.4 mmol/L (ref 3.5–5.1)
Sodium: 136 mmol/L (ref 135–145)
Total Bilirubin: 0.4 mg/dL (ref 0.3–1.2)
Total Protein: 7.9 g/dL (ref 6.5–8.1)

## 2022-08-27 LAB — CBC WITH DIFFERENTIAL (CANCER CENTER ONLY)
Abs Immature Granulocytes: 0.02 10*3/uL (ref 0.00–0.07)
Basophils Absolute: 0.1 10*3/uL (ref 0.0–0.1)
Basophils Relative: 1 %
Eosinophils Absolute: 0.1 10*3/uL (ref 0.0–0.5)
Eosinophils Relative: 1 %
HCT: 45.2 % (ref 36.0–46.0)
Hemoglobin: 15.1 g/dL — ABNORMAL HIGH (ref 12.0–15.0)
Immature Granulocytes: 0 %
Lymphocytes Relative: 16 %
Lymphs Abs: 1.1 10*3/uL (ref 0.7–4.0)
MCH: 31.3 pg (ref 26.0–34.0)
MCHC: 33.4 g/dL (ref 30.0–36.0)
MCV: 93.8 fL (ref 80.0–100.0)
Monocytes Absolute: 0.9 10*3/uL (ref 0.1–1.0)
Monocytes Relative: 13 %
Neutro Abs: 4.9 10*3/uL (ref 1.7–7.7)
Neutrophils Relative %: 69 %
Platelet Count: 341 10*3/uL (ref 150–400)
RBC: 4.82 MIL/uL (ref 3.87–5.11)
RDW: 12.7 % (ref 11.5–15.5)
WBC Count: 7.1 10*3/uL (ref 4.0–10.5)
nRBC: 0 % (ref 0.0–0.2)

## 2022-08-27 LAB — GENETIC SCREENING ORDER

## 2022-08-27 NOTE — Research (Signed)
[employer] 2021-05 - Specimen Collection Study to Evaluate Biomarkers in Subjects with Cancer    Patient Michelle Hubbard was identified by Dr. Pamelia Hoit as a potential candidate for the above listed study.  This Clinical Research Coordinator met with [name], [id number], on 08/27/22 in a manner and location that ensures patient privacy to discuss participation in the above listed research study.  Patient is Accompanied by daughter .  A copy of the informed consent document with embedded HIPAA language was provided to the patient.  Patient reads, speaks, and understands [place].   Patient was provided with the business card of this Coordinator and encouraged to contact the research team with any questions.  Approximately 15 minutes were spent with the patient reviewing the informed consent documents.  Patient was provided the option of taking informed consent documents home to review and was encouraged to review at their convenience with their support network, including other care providers. Patient took the consent documents home to review.  Will call the patient on [id number] to confirm study interest.   Felecia Jan, [facility] 08/27/2022 11:25 AM

## 2022-08-27 NOTE — Progress Notes (Signed)
[facility] CONSULT NOTE  Patient Care Team: Corwin Levins, MD as PCP - General Croitoru, Rachelle Hora, MD as PCP - Cardiology (Cardiology) Pershing Proud, RN as Oncology Nurse Navigator Donnelly Angelica, RN as Oncology Nurse Navigator Serena Croissant, MD as Consulting Physician (Hematology and Oncology) Harriette Bouillon, MD as Consulting Physician (General Surgery) Lonie Peak, MD as Attending Physician (Radiation Oncology)  CHIEF COMPLAINTS/PURPOSE OF CONSULTATION:  Newly diagnosed breast cancer  HISTORY OF PRESENTING ILLNESS:  Michelle Hubbard 77 y.o. female is here because of recent diagnosis of left breast cancer.  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  She was presented this morning in the multidisciplinary tumor board and she is here today accompanied by her daughter to discuss her treatment plan.  I reviewed her records extensively and collaborated the history with the patient.  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   COPD 01/08/2010   DEPRESSIVE DISORDER 12/23/2006   FATIGUE 01/08/2010   HYPERLIPIDEMIA 01/08/2010   Hypertension    LOW BACK PAIN 12/23/2006   OAB (overactive bladder) 10/07/2011   OSTEOPOROSIS 01/08/2010   Other specified forms of hearing loss 01/08/2010   PONV (postoperative nausea and vomiting)    SINUSITIS- ACUTE-NOS 07/09/2007   Urticaria    Vitamin D deficiency 04/30/2010    SURGICAL  HISTORY: Past Surgical History:  Procedure Laterality Date   ABDOMINAL HYSTERECTOMY     APPENDECTOMY  1988   BREAST BIOPSY  1986   BREAST BIOPSY Left 08/18/2022   [place] LT BREAST BX W LOC DEV 1ST LESION IMG BX SPEC US GUIDE 08/18/2022 [facility] MAMMOGR[facility]   COLON RESECTION N/A 06/03/2013   Procedure: LAPAROSCOPY DIAGNOSTIC and OPEN  SIGMOID COLON RESECTION ;  Surgeon: Axel Filler, MD;  Location: [facility];  Service: General;  Laterality: N/A;   COLON SURGERY  2000   colon obstruction   larygenscopy     OOPHORECTOMY  bilat 1988   secondary to cyst    SOCIAL HISTORY: Social History   Socioeconomic History   Marital status: Widowed    Spouse name: Not on file   Number of children: 3   Years of education: Not on file   Highest education level: Not on file  Occupational History   Occupation: Retired    Associate Professor: Designer, television/film set [employer]  Tobacco Use   Smoking status: Every Day    Current packs/day: 1.00    Average packs/day: 1 pack/day for 44.0 years (44.0 ttl pk-yrs)    Types: Cigarettes   Smokeless tobacco: Never   Tobacco comments:    smoking more due to move/ trying to quit  Vaping Use   Vaping status: Never Used  Substance and Sexual Activity   Alcohol use: Yes    Alcohol/week: 0.0 standard drinks of alcohol    Comment: vodka nightly   Drug use: No   Sexual activity: Not Currently  Other Topics Concern   Not on file  Social History Narrative  Not on file   Social Determinants of Health   Financial Resource Strain: Low Risk  (08/20/2021)   Overall Financial Resource Strain (CARDIA)    Difficulty of Paying Living Expenses: Not hard at all  Food Insecurity: No Food Insecurity (08/20/2021)   Hunger Vital Sign    Worried About Running Out of Food in the Last Year: Never true    Ran Out of Food in the Last Year: Never true  Transportation Needs: No Transportation Needs (08/20/2021)   PRAPARE - Administrator, Civil Service (Medical): No    Lack of Transportation  (Non-Medical): No  Physical Activity: Insufficiently Active (08/20/2021)   Exercise Vital Sign    Days of Exercise per Week: 1 day    Minutes of Exercise per Session: 120 min  Stress: No Stress Concern Present (08/20/2021)   [employer] of Occupational Health - Occupational Stress Questionnaire    Feeling of Stress : Only a little  Social Connections: Moderately Isolated (08/20/2021)   Social Connection and Isolation Panel [NHANES]    Frequency of Communication with Friends and Family: More than three times a week    Frequency of Social Gatherings with Friends and Family: Three times a week    Attends Religious Services: More than 4 times per year    Active Member of Clubs or Organizations: No    Attends Banker Meetings: Never    Marital Status: Widowed  Intimate Partner Violence: Not At Risk (08/20/2021)   Humiliation, Afraid, Rape, and Kick questionnaire    Fear of Current or Ex-Partner: No    Emotionally Abused: No    Physically Abused: No    Sexually Abused: No    FAMILY HISTORY: Family History  Problem Relation Age of Onset   Colon cancer Mother    Colon cancer Brother    Diabetes Maternal Grandmother    Diabetes Maternal Aunt    Allergic rhinitis Neg Hx    Angioedema Neg Hx    Asthma Neg Hx    Eczema Neg Hx    Immunodeficiency Neg Hx    Urticaria Neg Hx     ALLERGIES:  is allergic to codeine, lortab [hydrocodone-acetaminophen], and penicillins.  MEDICATIONS:  Current Outpatient Medications  Medication Sig Dispense Refill   Acetaminophen (TYLENOL PO) Take 500 mg by mouth daily as needed.     amLODipine (NORVASC) 5 MG tablet Take 1 tablet by mouth once daily 90 tablet 2   Coenzyme Q10 (COQ10) 100 MG CAPS Take 3 capsules by mouth daily. 30 capsule    diazepam (VALIUM) 2 MG tablet Take 2 mg by mouth 3 (three) times daily as needed.     levocetirizine (XYZAL) 5 MG tablet Take 5 mg by mouth every evening.     metoprolol succinate (TOPROL-XL) 25 MG 24  hr tablet TAKE 1/2 (ONE-HALF) TABLET BY MOUTH ONCE DAILY WITH OR IMMEDIATELY FOLLOWING A MEAL 45 tablet 0   nitroGLYCERIN (NITROSTAT) 0.4 MG SL tablet 1 tablet under the tongue may be used every 5 minutes as needed, for up to 15 minutes. 25 tablet 3   omeprazole (PRILOSEC) 10 MG capsule Take 20 mg by mouth as needed.     polyethylene glycol (MIRALAX / GLYCOLAX) 17 g packet Take 17 g by mouth daily.     Probiotic Product (PROBIOTIC ADVANCED PO) Take 1 capsule by mouth daily.     rosuvastatin (CRESTOR) 20 MG tablet Take 1 tablet by mouth once daily 90 tablet 3   temazepam (RESTORIL)  30 MG capsule Take 1 capsule (30 mg total) by mouth at bedtime as needed for sleep. 30 capsule 5   Triamcinolone Acetonide (NASACORT ALLERGY 24HR NA) Place 1 spray into the nose daily.     No current facility-administered medications for this visit.    REVIEW OF SYSTEMS:   Constitutional: Denies fevers, chills or abnormal night sweats   All other systems were reviewed with the patient and are negative.  PHYSICAL EXAMINATION: ECOG PERFORMANCE STATUS: 1 - Symptomatic but completely ambulatory  Vitals:   08/27/22 0850  BP: 127/77  Pulse: 89  Resp: 18  Temp: 98.2 F (36.8 C)  SpO2: 95%   Filed Weights   08/27/22 0850  Weight: 123 lb 14.4 oz (56.2 kg)    GENERAL:alert, no distress and comfortable    LABORATORY DATA:  I have reviewed the data as listed Lab Results  Component Value Date   WBC 7.1 08/27/2022   HGB 15.1 (H) 08/27/2022   HCT 45.2 08/27/2022   MCV 93.8 08/27/2022   PLT 341 08/27/2022   Lab Results  Component Value Date   NA 136 08/27/2022   K 4.4 08/27/2022   CL 100 08/27/2022   CO2 30 08/27/2022    RADIOGRAPHIC STUDIES: I have personally reviewed the radiological reports and agreed with the findings in the report.  ASSESSMENT AND PLAN:  Malignant neoplasm of upper-outer quadrant of left breast in female, estrogen receptor positive (HCC) 08/18/2022: Screening mammogram  detected left breast mass upper central 4 o'clock position: 1.3 cm, axilla negative, biopsy: Grade 2 IDC ER 95% PR 0% Ki67 15%, HER2 3+ positive  Pathology and radiology counseling: Discussed with the patient, the details of pathology including the type of breast cancer,the clinical staging, the significance of ER, PR and HER-2/neu receptors and the implications for treatment. After reviewing the pathology in detail, we proceeded to discuss the different treatment options between surgery, radiation, chemotherapy, antiestrogen therapies.  Treatment plan: Breast conserving surgery (foregoing sentinel lymph node biopsy) Adjuvant anti-HER2 therapy if the final pathology is truly HER2 positive.  We did not recommend chemo. Adjuvant radiation Followed by antiestrogen therapy with anastrozole 1 mg daily x 5 years  Return to clinic after surgery to discuss final pathology report and make a determination if she could receive injection Herceptin.  I do not believe she would be a good candidate for Taxol.  All questions were answered. The patient knows to call the clinic with any problems, questions or concerns.    Tamsen Meek, MD 08/27/22

## 2022-08-27 NOTE — Assessment & Plan Note (Addendum)
08/18/2022: Screening mammogram detected left breast mass upper central 4 o'clock position: 1.3 cm, axilla negative, biopsy: Grade 2 IDC ER 95% PR 0% Ki67 15%, HER2 3+ positive  Pathology and radiology counseling: Discussed with the patient, the details of pathology including the type of breast cancer,the clinical staging, the significance of ER, PR and HER-2/neu receptors and the implications for treatment. After reviewing the pathology in detail, we proceeded to discuss the different treatment options between surgery, radiation, chemotherapy, antiestrogen therapies.  Treatment plan: Breast conserving surgery (foregoing sentinel lymph node biopsy) Adjuvant anti-HER2 therapy if the final pathology is truly HER2 positive.  We did not recommend chemo. Adjuvant radiation Followed by antiestrogen therapy with anastrozole 1 mg daily x 5 years  Return to clinic after surgery to discuss final pathology report and make a determination if she could receive injection Herceptin.  I do not believe she would be a good candidate for Taxol.

## 2022-08-28 ENCOUNTER — Inpatient Hospital Stay: Payer: Medicare PPO | Attending: Licensed Clinical Social Worker | Admitting: Licensed Clinical Social Worker

## 2022-08-28 ENCOUNTER — Other Ambulatory Visit: Payer: Self-pay | Admitting: Surgery

## 2022-08-28 ENCOUNTER — Encounter: Payer: Self-pay | Admitting: Internal Medicine

## 2022-08-28 DIAGNOSIS — Z136 Encounter for screening for cardiovascular disorders: Secondary | ICD-10-CM | POA: Insufficient documentation

## 2022-08-28 DIAGNOSIS — C50912 Malignant neoplasm of unspecified site of left female breast: Secondary | ICD-10-CM

## 2022-08-28 NOTE — Progress Notes (Signed)
[facility] Clinical Social Work  Initial Assessment   Michelle Hubbard is a 77 y.o. year old female contacted by phone. Clinical Social Work was referred by new patient protocol for assessment of psychosocial needs.   SDOH (Social Determinants of Health) assessments performed: Yes SDOH Interventions    Flowsheet Row Clinical Support from 08/20/2021 in [facility] Clinical Support from 08/16/2019 in [facility] Clinical Support from 08/12/2018 in [facility]  SDOH Interventions     Food Insecurity Interventions Intervention Not Indicated Intervention Not Indicated --  Housing Interventions Intervention Not Indicated Intervention Not Indicated --  Transportation Interventions Intervention Not Indicated Intervention Not Indicated --  Depression Interventions/Treatment  -- -- Medication  Financial Strain Interventions Intervention Not Indicated Intervention Not Indicated --  Physical Activity Interventions Intervention Not Indicated Intervention Not Indicated --  Stress Interventions Intervention Not Indicated Intervention Not Indicated --  Social Connections Interventions Intervention Not Indicated Intervention Not Indicated --       SDOH Screenings   Food Insecurity: No Food Insecurity (08/28/2022)  Housing: Low Risk  (08/28/2022)  Transportation Needs: No Transportation Needs (08/28/2022)  Utilities: Not At Risk (08/28/2022)  Alcohol Screen: Low Risk  (08/20/2021)  Depression (PHQ2-9): Low Risk  (08/28/2022)  Financial Resource Strain: Low Risk  (08/28/2022)  Physical Activity: Insufficiently Active (08/20/2021)  Social Connections: Moderately Isolated (08/20/2021)  Stress: No Stress Concern Present (08/20/2021)  Tobacco Use: High Risk (08/27/2022)   Received from [facility]     Distress Screen completed: No     No data to display            Family/Social Information:  Housing  Arrangement: patient lives alone Family members/support persons in your life? Family, Friends, and Passenger transport manager concerns: no. Patient's daughter will transport to necessary appointments. Patient reported having supportive friends and neighbors who are willing to assist with any needs.  Employment: Retired. Income source: Actor concerns: No Type of concern: None Food access concerns: no Religious or spiritual practice: Not known Services Currently in place:  Insurance, Supportive Family, no current SDOH concerns.  Coping/ Adjustment to diagnosis: Patient understands treatment plan and what happens next? yes Concerns about diagnosis and/or treatment: I'm not especially worried about anything. Patient reported that she and her family are satisfied with the treatment plan, and spoke highly of medical providers. Patient stated that she decided on a lumpectomy vs mastectomy and feels comfortable with choice.  Patient reported stressors:  No reported stressors at this time. Patient enjoys gardening, being outside, time with family/ friends, and cleaning Current coping skills/ strengths: Ability for insight , Active sense of humor , Average or above average intelligence , Capable of independent living , Communication skills , Financial means , General fund of knowledge , Motivation for treatment/growth , Special hobby/interest , and Supportive family/friends     SUMMARY: Current SDOH Barriers:  No stated barriers at this time.   Clinical Social Work Clinical Goal(s):  No clinical social work goals at this time  Interventions: Discussed common feeling and emotions when being diagnosed with cancer, and the importance of support during treatment Informed patient of the support team roles and support services at [facility] Provided CSW contact information and encouraged patient to call with any questions or concerns   Follow Up Plan: CSW will follow-up with  patient by phone following surgery.  Patient verbalizes understanding of plan: Yes  Marguerita Merles,  LCSWA Clinical Social Worker [facility]

## 2022-09-01 ENCOUNTER — Telehealth: Payer: Self-pay

## 2022-09-01 NOTE — Telephone Encounter (Signed)
[employer] 2021-05 - Specimen Collection Study to Evaluate Biomarkers in Subjects with Cancer   Called and spoke with patient to follow-up on above-listed study. Patient stated that at this time, she was not interested in participating. Patient thanked for her time and consideration and encouraged to call with any future questions.  Juanita Laster, RN, BSN, CPN Clinical Research Nurse I [phone]  09/01/2022 10:22 AM

## 2022-09-02 ENCOUNTER — Ambulatory Visit (INDEPENDENT_AMBULATORY_CARE_PROVIDER_SITE_OTHER): Payer: Medicare PPO

## 2022-09-02 VITALS — Ht 65.0 in | Wt 123.0 lb

## 2022-09-02 DIAGNOSIS — F17210 Nicotine dependence, cigarettes, uncomplicated: Secondary | ICD-10-CM | POA: Diagnosis not present

## 2022-09-02 DIAGNOSIS — Z0000 Encounter for general adult medical examination without abnormal findings: Secondary | ICD-10-CM | POA: Diagnosis not present

## 2022-09-02 NOTE — Patient Instructions (Addendum)
Michelle Hubbard , Thank you for taking time to come for your Medicare Wellness Visit. I appreciate your ongoing commitment to your health goals. Please review the following plan we discussed and let me know if I can assist you in the future.   Referrals/Orders/Follow-Ups/Clinician Recommendations: A referral has been place for you at [facility] for your Lung screening.  Please call to get scheduled [phone] [facility] at [facility].  [address] [place], [place], [place] [address].  Keep up the good work.  This is a list of the screening recommended for you and due dates:  Health Maintenance  Topic Date Due   Screening for Lung Cancer  10/23/2020   COVID-19 Vaccine (5 - 2023-24 season) 09/27/2021   Flu Shot  08/28/2022   Medicare Annual Wellness Visit  09/02/2023   Mammogram  08/05/2024   Colon Cancer Screening  02/06/2027   DTaP/Tdap/Td vaccine (3 - Td or Tdap) 08/21/2032   Pneumonia Vaccine  Completed   DEXA scan (bone density measurement)  Completed   Hepatitis C Screening  Completed   Zoster (Shingles) Vaccine  Completed   HPV Vaccine  Aged Out    Advanced directives: (Declined) Advance directive discussed with you today. Even though you declined this today, please call our office should you change your mind, and we can give you the proper paperwork for you to fill out.  Next Medicare Annual Wellness Visit scheduled for next year: Yes  Preventive Care 40 Years and Older, Female Preventive care refers to lifestyle choices and visits with your health care provider that can promote health and wellness. What does preventive care include? A yearly physical exam. This is also called an annual well check. Dental exams once or twice a year. Routine eye exams. Ask your health care provider how often you should have your eyes checked. Personal lifestyle choices, including: Daily care of your teeth and gums. Regular physical activity. Eating a healthy  diet. Avoiding tobacco and drug use. Limiting alcohol use. Practicing safe sex. Taking low-dose aspirin every day. Taking vitamin and mineral supplements as recommended by your health care provider. What happens during an annual well check? The services and screenings done by your health care provider during your annual well check will depend on your age, overall health, lifestyle risk factors, and family history of disease. Counseling  Your health care provider may ask you questions about your: Alcohol use. Tobacco use. Drug use. Emotional well-being. Home and relationship well-being. Sexual activity. Eating habits. History of falls. Memory and ability to understand (cognition). Work and work Astronomer. Reproductive health. Screening  You may have the following tests or measurements: Height, weight, and BMI. Blood pressure. Lipid and cholesterol levels. These may be checked every 5 years, or more frequently if you are over 35 years old. Skin check. Lung cancer screening. You may have this screening every year starting at age 49 if you have a 30-pack-year history of smoking and currently smoke or have quit within the past 15 years. Fecal occult blood test (FOBT) of the stool. You may have this test every year starting at age 34. Flexible sigmoidoscopy or colonoscopy. You may have a sigmoidoscopy every 5 years or a colonoscopy every 10 years starting at age 47. Hepatitis C blood test. Hepatitis B blood test. Sexually transmitted disease (STD) testing. Diabetes screening. This is done by checking your blood sugar (glucose) after you have not eaten for a while (fasting). You may have this done every 1-3 years.  Bone density scan. This is done to screen for osteoporosis. You may have this done starting at age 56. Mammogram. This may be done every 1-2 years. Talk to your health care provider about how often you should have regular mammograms. Talk with your health care provider about  your test results, treatment options, and if necessary, the need for more tests. Vaccines  Your health care provider may recommend certain vaccines, such as: Influenza vaccine. This is recommended every year. Tetanus, diphtheria, and acellular pertussis (Tdap, Td) vaccine. You may need a Td booster every 10 years. Zoster vaccine. You may need this after age 55. Pneumococcal 13-valent conjugate (PCV13) vaccine. One dose is recommended after age 67. Pneumococcal polysaccharide (PPSV23) vaccine. One dose is recommended after age 53. Talk to your health care provider about which screenings and vaccines you need and how often you need them. This information is not intended to replace advice given to you by your health care provider. Make sure you discuss any questions you have with your health care provider. Document Released: 02/09/2015 Document Revised: 10/03/2015 Document Reviewed: 11/14/2014 Elsevier Interactive Patient Education  2017 [employer].  Fall Prevention in the Home Falls can cause injuries. They can happen to people of all ages. There are many things you can do to make your home safe and to help prevent falls. What can I do on the outside of my home? Regularly fix the edges of walkways and driveways and fix any cracks. Remove anything that might make you trip as you walk through a door, such as a raised step or threshold. Trim any bushes or trees on the path to your home. Use bright outdoor lighting. Clear any walking paths of anything that might make someone trip, such as rocks or tools. Regularly check to see if handrails are loose or broken. Make sure that both sides of any steps have handrails. Any raised decks and porches should have guardrails on the edges. Have any leaves, snow, or ice cleared regularly. Use sand or salt on walking paths during winter. Clean up any spills in your garage right away. This includes oil or grease spills. What can I do in the bathroom? Use  night lights. Install grab bars by the toilet and in the tub and shower. Do not use towel bars as grab bars. Use non-skid mats or decals in the tub or shower. If you need to sit down in the shower, use a plastic, non-slip stool. Keep the floor dry. Clean up any water that spills on the floor as soon as it happens. Remove soap buildup in the tub or shower regularly. Attach bath mats securely with double-sided non-slip rug tape. Do not have throw rugs and other things on the floor that can make you trip. What can I do in the bedroom? Use night lights. Make sure that you have a light by your bed that is easy to reach. Do not use any sheets or blankets that are too big for your bed. They should not hang down onto the floor. Have a firm chair that has side arms. You can use this for support while you get dressed. Do not have throw rugs and other things on the floor that can make you trip. What can I do in the kitchen? Clean up any spills right away. Avoid walking on wet floors. Keep items that you use a lot in easy-to-reach places. If you need to reach something above you, use a strong step stool that has a grab bar.  Keep electrical cords out of the way. Do not use floor polish or wax that makes floors slippery. If you must use wax, use non-skid floor wax. Do not have throw rugs and other things on the floor that can make you trip. What can I do with my stairs? Do not leave any items on the stairs. Make sure that there are handrails on both sides of the stairs and use them. Fix handrails that are broken or loose. Make sure that handrails are as long as the stairways. Check any carpeting to make sure that it is firmly attached to the stairs. Fix any carpet that is loose or worn. Avoid having throw rugs at the top or bottom of the stairs. If you do have throw rugs, attach them to the floor with carpet tape. Make sure that you have a light switch at the top of the stairs and the bottom of the  stairs. If you do not have them, ask someone to add them for you. What else can I do to help prevent falls? Wear shoes that: Do not have high heels. Have rubber bottoms. Are comfortable and fit you well. Are closed at the toe. Do not wear sandals. If you use a stepladder: Make sure that it is fully opened. Do not climb a closed stepladder. Make sure that both sides of the stepladder are locked into place. Ask someone to hold it for you, if possible. Clearly mark and make sure that you can see: Any grab bars or handrails. First and last steps. Where the edge of each step is. Use tools that help you move around (mobility aids) if they are needed. These include: Canes. Walkers. Scooters. Crutches. Turn on the lights when you go into a dark area. Replace any light bulbs as soon as they burn out. Set up your furniture so you have a clear path. Avoid moving your furniture around. If any of your floors are uneven, fix them. If there are any pets around you, be aware of where they are. Review your medicines with your doctor. Some medicines can make you feel dizzy. This can increase your chance of falling. Ask your doctor what other things that you can do to help prevent falls. This information is not intended to replace advice given to you by your health care provider. Make sure you discuss any questions you have with your health care provider. Document Released: 11/09/2008 Document Revised: 06/21/2015 Document Reviewed: 02/17/2014 Elsevier Interactive Patient Education  2017 [employer].

## 2022-09-02 NOTE — Progress Notes (Signed)
Subjective:   Michelle Hubbard is a 77 y.o. female who presents for Medicare Annual (Subsequent) preventive examination.  Visit Complete: Virtual  I connected with  [name] on 09/02/22 by a audio enabled telemedicine application and verified that I am speaking with the correct person using two identifiers.  Patient Location: Home  Provider Location: Home Office  I discussed the limitations of evaluation and management by telemedicine. The patient expressed understanding and agreed to proceed.  Patient Medicare AWV questionnaire was completed by the patient on 09/02/2022; I have confirmed that all information answered by patient is correct and no changes since this date.  Review of Systems    Cardiac Risk Factors include: advanced age (>76men, >43 women);Other (see comment);dyslipidemia, Risk factor comments: CAD, COPD, Osteoporosis     Objective:    Today's Vitals   09/02/22 1035  Weight: 123 lb (55.8 kg)  Height: 5\' 5"  (1.651 m)   Body mass index is 20.47 kg/m.     09/02/2022   10:40 AM 08/20/2021    3:21 PM 08/20/2020    2:25 PM 06/24/2020    9:56 AM 08/16/2019    1:53 PM 08/12/2018    1:16 PM 07/02/2017    1:28 PM  Advanced Directives  Does Patient Have a Medical Advance Directive? Yes Yes Yes Yes Yes Yes Yes  Type of Estate agent of [place];Living will Healthcare Power of Attorney Living will;Healthcare Power of Attorney Living will Healthcare Power of [place];Living will Healthcare Power of [place];Living will Healthcare Power of [place];Living will  Does patient want to make changes to medical advance directive?  Yes (MAU/Ambulatory/Procedural Areas - Information given) No - Patient declined  No - Patient declined    Copy of Healthcare Power of Attorney in Chart? No - copy requested  No - copy requested  No - copy requested No - copy requested No - copy requested    Current Medications (verified) Outpatient Encounter Medications as of  09/02/2022  Medication Sig   Acetaminophen (TYLENOL PO) Take 500 mg by mouth daily as needed.   amLODipine (NORVASC) 5 MG tablet Take 1 tablet by mouth once daily   Coenzyme Q10 (COQ10) 100 MG CAPS Take 3 capsules by mouth daily.   diazepam (VALIUM) 2 MG tablet Take 2 mg by mouth 3 (three) times daily as needed.   levocetirizine (XYZAL) 5 MG tablet Take 5 mg by mouth every evening.   metoprolol succinate (TOPROL-XL) 25 MG 24 hr tablet TAKE 1/2 (ONE-HALF) TABLET BY MOUTH ONCE DAILY WITH OR IMMEDIATELY FOLLOWING A MEAL   nitroGLYCERIN (NITROSTAT) 0.4 MG SL tablet 1 tablet under the tongue may be used every 5 minutes as needed, for up to 15 minutes.   omeprazole (PRILOSEC) 10 MG capsule Take 20 mg by mouth as needed.   polyethylene glycol (MIRALAX / GLYCOLAX) 17 g packet Take 17 g by mouth daily.   Probiotic Product (PROBIOTIC ADVANCED PO) Take 1 capsule by mouth daily.   rosuvastatin (CRESTOR) 20 MG tablet Take 1 tablet by mouth once daily   temazepam (RESTORIL) 30 MG capsule Take 1 capsule (30 mg total) by mouth at bedtime as needed for sleep.   Triamcinolone Acetonide (NASACORT ALLERGY 24HR NA) Place 1 spray into the nose daily.   No facility-administered encounter medications on file as of 09/02/2022.    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   COPD 01/08/2010  DEPRESSIVE DISORDER 12/23/2006   FATIGUE 01/08/2010   HYPERLIPIDEMIA 01/08/2010   Hypertension    LOW BACK PAIN 12/23/2006   OAB (overactive bladder) 10/07/2011   OSTEOPOROSIS 01/08/2010   Other specified forms of hearing loss 01/08/2010   PONV (postoperative nausea and vomiting)    SINUSITIS- ACUTE-NOS 07/09/2007   Urticaria    Vitamin D deficiency 04/30/2010   Past Surgical History:  Procedure Laterality Date   ABDOMINAL HYSTERECTOMY     APPENDECTOMY  1988   BREAST BIOPSY  1986    BREAST BIOPSY Left 08/18/2022   [place] LT BREAST BX W LOC DEV 1ST LESION IMG BX SPEC US GUIDE 08/18/2022 [facility]   COLON RESECTION N/A 06/03/2013   Procedure: LAPAROSCOPY DIAGNOSTIC and OPEN  SIGMOID COLON RESECTION ;  Surgeon: Axel Filler, MD;  Location: [facility];  Service: General;  Laterality: N/A;   COLON SURGERY  2000   colon obstruction   larygenscopy     OOPHORECTOMY  bilat 1988   secondary to cyst   Family History  Problem Relation Age of Onset   Colon cancer Mother    Colon cancer Brother    Diabetes Maternal Grandmother    Diabetes Maternal Aunt    Allergic rhinitis Neg Hx    Angioedema Neg Hx    Asthma Neg Hx    Eczema Neg Hx    Immunodeficiency Neg Hx    Urticaria Neg Hx    Social History   Socioeconomic History   Marital status: Widowed    Spouse name: Not on file   Number of children: 3   Years of education: Not on file   Highest education level: Not on file  Occupational History   Occupation: Retired    Associate Professor: Designer, television/film set [employer]  Tobacco Use   Smoking status: Every Day    Current packs/day: 1.00    Average packs/day: 1 pack/day for 44.0 years (44.0 ttl pk-yrs)    Types: Cigarettes   Smokeless tobacco: Never   Tobacco comments:    smoking more due to move/ trying to quit  Vaping Use   Vaping status: Never Used  Substance and Sexual Activity   Alcohol use: Not Currently    Comment: vodka nightly   Drug use: No   Sexual activity: Not Currently  Other Topics Concern   Not on file  Social History Narrative   Not on file   Social Determinants of Health   Financial Resource Strain: Low Risk  (09/02/2022)   Overall Financial Resource Strain (CARDIA)    Difficulty of Paying Living Expenses: Not hard at all  Food Insecurity: No Food Insecurity (09/02/2022)   Hunger Vital Sign    Worried About Running Out of Food in the Last Year: Never true    Ran Out of Food in the Last Year: Never true  Transportation Needs: No Transportation Needs  (08/28/2022)   PRAPARE - Administrator, Civil Service (Medical): No    Lack of Transportation (Non-Medical): No  Physical Activity: Insufficiently Active (08/20/2021)   Exercise Vital Sign    Days of Exercise per Week: 1 day    Minutes of Exercise per Session: 120 min  Stress: No Stress Concern Present (08/20/2021)   [employer] of Occupational Health - Occupational Stress Questionnaire    Feeling of Stress : Only a little  Social Connections: Moderately Isolated (08/20/2021)   Social Connection and Isolation Panel [NHANES]    Frequency of Communication with Friends and Family: More than three times  a week    Frequency of Social Gatherings with Friends and Family: Three times a week    Attends Religious Services: More than 4 times per year    Active Member of Clubs or Organizations: No    Attends Banker Meetings: Never    Marital Status: Widowed    Tobacco Counseling Ready to quit: Not Answered Counseling given: Not Answered Tobacco comments: smoking more due to move/ trying to quit   Clinical Intake:  Pre-visit preparation completed: Yes  Pain : No/denies pain     BMI - recorded: 20.47 Nutritional Risks: None Diabetes: No  How often do you need to have someone help you when you read instructions, pamphlets, or other written materials from your doctor or pharmacy?: 2 - Rarely  Interpreter Needed?: No  Information entered by ::  , RMA   Activities of Daily Living    09/02/2022    9:07 AM  In your present state of health, do you have any difficulty performing the following activities:  Hearing? 0  Vision? 0  Difficulty concentrating or making decisions? 0  Walking or climbing stairs? 0  Dressing or bathing? 0  Doing errands, shopping? 0  Preparing Food and eating ? N  Using the Toilet? N  In the past six months, have you accidently leaked urine? N  Do you have problems with loss of bowel control? N  Managing your  Medications? N  Managing your Finances? N  Housekeeping or managing your Housekeeping? N    Patient Care Team: Corwin Levins, MD as PCP - General Croitoru, Rachelle Hora, MD as PCP - Cardiology (Cardiology) Pershing Proud, RN as Oncology Nurse Navigator Donnelly Angelica, RN as Oncology Nurse Navigator Serena Croissant, MD as Consulting Physician (Hematology and Oncology) Harriette Bouillon, MD as Consulting Physician (General Surgery) Lonie Peak, MD as Attending Physician (Radiation Oncology) Yvonne Kendall, MD as Referring Physician (Ophthalmology)  Indicate any recent Medical Services you may have received from other than [facility] providers in the past year (date may be approximate).     Assessment:   This is a routine wellness examination for [place].  Hearing/Vision screen Hearing Screening - Comments:: Wears hearing aides Vision Screening - Comments:: Cataract Surgery  Dietary issues and exercise activities discussed:     Goals Addressed             This Visit's Progress    Maintain current health status   On track    Continue to be active exercise, eat healthy, enjoy life and family      Depression Screen    09/02/2022   10:44 AM 08/28/2022    9:25 AM 07/29/2022   11:02 AM 09/09/2021   11:41 AM 08/20/2021    3:14 PM 08/20/2021    3:06 PM 04/08/2021   11:05 AM  PHQ 2/9 Scores  PHQ - 2 Score 0 0 0 0 0 1 0  PHQ- 9 Score 6   0       Fall Risk    09/02/2022    9:07 AM 07/29/2022   11:01 AM 09/09/2021   11:41 AM 08/20/2021    3:06 PM 04/08/2021   11:04 AM  Fall Risk   Falls in the past year? 0 0 0 1 1  Number falls in past yr: 0 0 0 0 0  Injury with Fall? 0 0 0 1 0  Risk for fall due to :  No Fall Risks No Fall Risks    Follow up  Falls evaluation completed Falls evaluation completed      MEDICARE RISK AT HOME:   TIMED UP AND GO:  Was the test performed?  No    Cognitive Function:    08/12/2018    1:20 PM 11/28/2014   12:11 PM  MMSE - Mini Mental State Exam  Not  completed:  --  Orientation to time 5   Orientation to Place 5   Registration 3   Attention/ Calculation 5   Recall 3   Language- name 2 objects 2   Language- repeat 1   Language- follow 3 step command 3   Language- read & follow direction 1   Write a sentence 1   Copy design 1   Total score 30         09/02/2022   10:41 AM 08/16/2019    1:55 PM  6CIT Screen  What Year? 0 points 0 points  What month? 0 points 0 points  What time? 0 points 0 points  Count back from 20 0 points 0 points  Months in reverse 0 points 0 points  Repeat phrase 0 points 0 points  Total Score 0 points 0 points    Immunizations Immunization History  Administered Date(s) Administered   DTaP 08/22/2022   Fluad Quad(high Dose 65+) 11/18/2021   Influenza Inj Mdck Quad With Preservative 10/24/2016   Influenza Split 10/07/2011, 12/05/2015   Influenza Whole 01/08/2010   Influenza, High Dose Seasonal PF 11/28/2014, 01/06/2018, 11/17/2018, 10/27/2019   Influenza,inj,Quad PF,6+ Mos 12/20/2013, 11/28/2015   Influenza-Unspecified 11/27/2017   PFIZER(Purple Top)SARS-COV-2 Vaccination 03/27/2019, 04/20/2019, 01/16/2020, 08/07/2020   Pneumococcal Conjugate-13 12/28/2014   Pneumococcal Polysaccharide-23 01/08/2010   Tdap 10/07/2011   Zoster Recombinant(Shingrix) 04/23/2017, 05/20/2017, 06/25/2017, 07/20/2017    TDAP status: Up to date  Flu Vaccine status: Up to date  Pneumococcal vaccine status: Up to date  Covid-19 vaccine status: Completed vaccines  Qualifies for Shingles Vaccine? Yes   Zostavax completed Yes   Shingrix Completed?: Yes  Screening Tests Health Maintenance  Topic Date Due   Lung Cancer Screening  10/23/2020   COVID-19 Vaccine (5 - 2023-24 season) 09/27/2021   INFLUENZA VACCINE  08/28/2022   Medicare Annual Wellness (AWV)  09/02/2023   MAMMOGRAM  08/05/2024   Colonoscopy  02/06/2027   DTaP/Tdap/Td (3 - Td or Tdap) 08/21/2032   Pneumonia Vaccine 73+ Years old  Completed   DEXA  SCAN  Completed   Hepatitis C Screening  Completed   Zoster Vaccines- Shingrix  Completed   HPV VACCINES  Aged Out    Health Maintenance  Health Maintenance Due  Topic Date Due   Lung Cancer Screening  10/23/2020   COVID-19 Vaccine (5 - 2023-24 season) 09/27/2021   INFLUENZA VACCINE  08/28/2022    Colorectal cancer screening: Type of screening: Colonoscopy. Completed 05/07/2022. Repeat every 5 years.    Mammogram status: Completed 08/06/2022. Repeat every year  Bone Density status: Completed 03/06/2010. Results reflect: Bone density results: OSTEOPOROSIS. Repeat every 2 years.  Lung Cancer Screening: (Low Dose CT Chest recommended if Age 4-80 years, 20 pack-year currently smoking OR have quit w/in 15years.) does qualify.   Lung Cancer Screening Referral: 09/02/2022  Additional Screening:  Hepatitis C Screening: does qualify; Completed 12/28/2014  Vision Screening: Recommended annual ophthalmology exams for early detection of glaucoma and other disorders of the eye. Is the patient up to date with their annual eye exam?  Yes  Who is the provider or what is the name of the office in which  the patient attends annual eye exams? Dr. Frederik Schmidt [facility])  If pt is not established with a provider, would they like to be referred to a provider to establish care? No .   Dental Screening: Recommended annual dental exams for proper oral hygiene   Community Resource Referral / Chronic Care Management: CRR required this visit?  No   CCM required this visit?  No     Plan:     I have personally reviewed and noted the following in the patient's chart:   Medical and social history Use of alcohol, tobacco or illicit drugs  Current medications and supplements including opioid prescriptions. Patient is not currently taking opioid prescriptions. Functional ability and status Nutritional status Physical activity Advanced directives List of other physicians Hospitalizations,  surgeries, and ER visits in previous 12 months Vitals Screenings to include cognitive, depression, and falls Referrals and appointments  In addition, I have reviewed and discussed with patient certain preventive protocols, quality metrics, and best practice recommendations. A written personalized care plan for preventive services as well as general preventive health recommendations were provided to patient.      L , CMA   09/02/2022   After Visit Summary: (MyChart) Due to this being a telephonic visit, the after visit summary with patients personalized plan was offered to patient via MyChart   Nurse Notes: Patient is due for a Chest CT.  It will not allow me to place the referral for patient.  I will send referral via TE to Dr. Jonny Ruiz.  Patient declines a DEXA.  Last screening done in 2012.  She will receive her Flu vaccine before October is out.  She has no other concerns today.

## 2022-09-03 ENCOUNTER — Encounter: Payer: Self-pay | Admitting: *Deleted

## 2022-09-03 ENCOUNTER — Telehealth: Payer: Self-pay | Admitting: *Deleted

## 2022-09-03 NOTE — Telephone Encounter (Signed)
Spoke with patient to follow up from [facility] 7/31 and assess navigation needs. Patient denies any questions or concerns at this time. Encouraged her to call should anything arise. Patient verbalized understanding.

## 2022-09-05 ENCOUNTER — Telehealth: Payer: Self-pay | Admitting: *Deleted

## 2022-09-05 ENCOUNTER — Inpatient Hospital Stay: Payer: Medicare PPO

## 2022-09-05 NOTE — Progress Notes (Signed)
[facility] CSW Progress Note  Clinical Child psychotherapist contacted patient by phone to follow up on vm. Patient had questions about surgery and meant to get in touch with nurse navigator Doristine Bosworth.   Interventions:  CSW discussed different patient care team members and messaged nurse navigator to reach out.  CSW offered emotional support during discussion about adjusting to cancer treatment.  CSW discussed supports offered by cancer center (peer mentor, chaplin), pt will reach out if she decides she would like to utilize those services.   Next steps CSW will follow up next week.   Marguerita Merles, LCSWA Clinical Social Worker [facility]

## 2022-09-05 NOTE — Telephone Encounter (Signed)
Spoke with patient regarding some questions she had about her treatment plan. Reviewed appts and answered her questions.  Encouraged her to call should she have any other concerns or questions. Patient verbalized understanding.

## 2022-09-08 ENCOUNTER — Ambulatory Visit: Payer: Medicare PPO | Admitting: Neurology

## 2022-09-10 ENCOUNTER — Encounter: Payer: Self-pay | Admitting: Internal Medicine

## 2022-09-10 MED ORDER — TEMAZEPAM 30 MG PO CAPS: 30.0000 mg | ORAL_CAPSULE | Freq: Every evening | ORAL | 5 refills | Status: DC | PRN

## 2022-09-11 ENCOUNTER — Encounter (HOSPITAL_BASED_OUTPATIENT_CLINIC_OR_DEPARTMENT_OTHER): Payer: Self-pay | Admitting: Surgery

## 2022-09-11 ENCOUNTER — Other Ambulatory Visit: Payer: Self-pay

## 2022-09-11 NOTE — Progress Notes (Signed)
   09/11/22 1305  Pre-op Phone Call  Surgery Date Verified 09/18/22  Arrival Time Verified 1200  Surgery Location Verified [facility]  Medical History Reviewed Yes  Is the patient taking a GLP-1 receptor agonist? No  Do you have a history of heart problems? Yes  Cardiologist Name Sees Dr Royann Shivers for HTN, CAD HLD last OV 12/08/21 f/u 1 yr. Pt denies any CP or need for prn nitroglycerin in over a yr.  Have you ever had tests on your heart? Yes  Results viewable: [facility] Media Tab  Does patient have other implanted devices? No  Patient Teaching Pre / Post Procedure  Patient educated about smoking cessation 24 hours prior to surgery. (S)  Yes  Patient verbalizes understanding of bowel prep? N/A  Med Rec Completed Yes  Take the Following Meds the Morning of Surgery Toprol norvasc and prilosec  Recent  Lab Work, EKG, CXR? No  NPO (Including gum & candy) After midnight  Patient instructed to stop clear liquids including Carb loading drink at: 0945  Stop Solids, Milk, Candy, and Gum STARTING AT MIDNIGHT  Responsible adult to drive and be with you for 24 hours? Yes  Name & Phone Number for Ride/Caregiver Daughter [place] Citizen  No Jewelry, money, nail polish or make-up.  No lotions, powders, perfumes. No shaving  48 hrs. prior to surgery. Yes  Contacts, Dentures & Glasses Will Have to be Removed Before OR. Yes  Please bring your ID and Insurance Card the morning of your surgery. (Surgery Centers Only) Yes  Bring any papers or x-rays with you that your surgeon gave you. Yes  Instructed to contact the location of procedure/ provider if they or anyone in their household develops symptoms or tests positive for COVID-19, has close contact with someone who tests positive for COVID, or has known exposure to any contagious illness. Yes  Call this number the morning of surgery  with any problems that may cancel your surgery. [phone]

## 2022-09-17 ENCOUNTER — Ambulatory Visit
Admission: RE | Admit: 2022-09-17 | Discharge: 2022-09-17 | Disposition: A | Discharge status: Home / Self Care | Payer: Medicare PPO | Source: Ambulatory Visit | Attending: Surgery | Admitting: Surgery

## 2022-09-17 ENCOUNTER — Encounter (HOSPITAL_BASED_OUTPATIENT_CLINIC_OR_DEPARTMENT_OTHER)
Admission: RE | Admit: 2022-09-17 | Discharge: 2022-09-17 | Disposition: A | Discharge status: Home / Self Care | Payer: Medicare PPO | Source: Ambulatory Visit | Attending: Surgery | Admitting: Surgery

## 2022-09-17 DIAGNOSIS — C50812 Malignant neoplasm of overlapping sites of left female breast: Secondary | ICD-10-CM | POA: Diagnosis not present

## 2022-09-17 DIAGNOSIS — Z136 Encounter for screening for cardiovascular disorders: Secondary | ICD-10-CM | POA: Diagnosis not present

## 2022-09-17 HISTORY — PX: BREAST BIOPSY: SHX20

## 2022-09-17 MED ORDER — CHLORHEXIDINE GLUCONATE CLOTH 2 % EX PADS: 6.0000 | MEDICATED_PAD | Freq: Once | CUTANEOUS | Status: DC

## 2022-09-17 NOTE — Progress Notes (Signed)
Gave patient CHG soap with instructions, patient verbalized understanding. Pt given Ensure to drink by 0945 morning of surgery. Pt verbalized understanding of preop instructions. Pt's O2 sat was measured at 96% on room air.

## 2022-09-18 ENCOUNTER — Other Ambulatory Visit: Payer: Self-pay

## 2022-09-18 ENCOUNTER — Ambulatory Visit (HOSPITAL_BASED_OUTPATIENT_CLINIC_OR_DEPARTMENT_OTHER): Payer: Medicare PPO | Admitting: Anesthesiology

## 2022-09-18 ENCOUNTER — Ambulatory Visit
Admission: RE | Admit: 2022-09-18 | Discharge: 2022-09-18 | Disposition: A | Discharge status: Home / Self Care | Payer: Medicare Other | Source: Ambulatory Visit | Attending: Surgery | Admitting: Surgery

## 2022-09-18 ENCOUNTER — Encounter (HOSPITAL_BASED_OUTPATIENT_CLINIC_OR_DEPARTMENT_OTHER): Payer: Self-pay | Admitting: Surgery

## 2022-09-18 ENCOUNTER — Ambulatory Visit (HOSPITAL_BASED_OUTPATIENT_CLINIC_OR_DEPARTMENT_OTHER)
Admission: RE | Admit: 2022-09-18 | Discharge: 2022-09-18 | Disposition: A | Discharge status: Home / Self Care | Payer: Medicare PPO | Attending: Surgery | Admitting: Surgery

## 2022-09-18 ENCOUNTER — Encounter (HOSPITAL_BASED_OUTPATIENT_CLINIC_OR_DEPARTMENT_OTHER)
Admission: RE | Disposition: A | Discharge status: Home / Self Care | Payer: Self-pay | Source: Home / Self Care | Attending: Surgery

## 2022-09-18 DIAGNOSIS — E785 Hyperlipidemia, unspecified: Secondary | ICD-10-CM | POA: Insufficient documentation

## 2022-09-18 DIAGNOSIS — M1990 Unspecified osteoarthritis, unspecified site: Secondary | ICD-10-CM | POA: Diagnosis not present

## 2022-09-18 DIAGNOSIS — J449 Chronic obstructive pulmonary disease, unspecified: Secondary | ICD-10-CM | POA: Diagnosis not present

## 2022-09-18 DIAGNOSIS — Z79899 Other long term (current) drug therapy: Secondary | ICD-10-CM | POA: Diagnosis not present

## 2022-09-18 DIAGNOSIS — C50812 Malignant neoplasm of overlapping sites of left female breast: Secondary | ICD-10-CM | POA: Insufficient documentation

## 2022-09-18 DIAGNOSIS — N6022 Fibroadenosis of left breast: Secondary | ICD-10-CM | POA: Diagnosis not present

## 2022-09-18 DIAGNOSIS — M810 Age-related osteoporosis without current pathological fracture: Secondary | ICD-10-CM | POA: Diagnosis not present

## 2022-09-18 DIAGNOSIS — F17210 Nicotine dependence, cigarettes, uncomplicated: Secondary | ICD-10-CM | POA: Diagnosis not present

## 2022-09-18 DIAGNOSIS — C50912 Malignant neoplasm of unspecified site of left female breast: Secondary | ICD-10-CM

## 2022-09-18 DIAGNOSIS — C50412 Malignant neoplasm of upper-outer quadrant of left female breast: Secondary | ICD-10-CM | POA: Diagnosis not present

## 2022-09-18 DIAGNOSIS — I2510 Atherosclerotic heart disease of native coronary artery without angina pectoris: Secondary | ICD-10-CM | POA: Diagnosis not present

## 2022-09-18 DIAGNOSIS — Z170 Estrogen receptor positive status [ER+]: Secondary | ICD-10-CM | POA: Insufficient documentation

## 2022-09-18 DIAGNOSIS — J4489 Other specified chronic obstructive pulmonary disease: Secondary | ICD-10-CM | POA: Insufficient documentation

## 2022-09-18 DIAGNOSIS — R238 Other skin changes: Secondary | ICD-10-CM | POA: Diagnosis not present

## 2022-09-18 HISTORY — PX: BREAST LUMPECTOMY WITH RADIOACTIVE SEED LOCALIZATION: SHX6424

## 2022-09-18 SURGERY — BREAST LUMPECTOMY WITH RADIOACTIVE SEED LOCALIZATION
Anesthesia: General | Site: Breast | Laterality: Left

## 2022-09-18 MED ORDER — AMISULPRIDE (ANTIEMETIC) 5 MG/2ML IV SOLN: 10.0000 mg | Freq: Once | INTRAVENOUS | Status: DC | PRN

## 2022-09-18 MED ORDER — ONDANSETRON HCL 4 MG/2ML IJ SOLN
INTRAMUSCULAR | Status: AC
  Filled 2022-09-18: qty 2

## 2022-09-18 MED ORDER — FENTANYL CITRATE (PF) 100 MCG/2ML IJ SOLN
INTRAMUSCULAR | Status: DC | PRN
  Administered 2022-09-18 (×2): 50 ug via INTRAVENOUS

## 2022-09-18 MED ORDER — LACTATED RINGERS IV SOLN: INTRAVENOUS | Status: DC

## 2022-09-18 MED ORDER — EPHEDRINE SULFATE (PRESSORS) 50 MG/ML IJ SOLN
INTRAMUSCULAR | Status: DC | PRN
  Administered 2022-09-18 (×2): 10 mg via INTRAVENOUS

## 2022-09-18 MED ORDER — PHENYLEPHRINE HCL (PRESSORS) 10 MG/ML IV SOLN
INTRAVENOUS | Status: DC | PRN
  Administered 2022-09-18 (×3): 80 ug via INTRAVENOUS

## 2022-09-18 MED ORDER — FENTANYL CITRATE (PF) 100 MCG/2ML IJ SOLN
25.0000 ug | INTRAMUSCULAR | Status: DC | PRN
  Administered 2022-09-18 (×2): 25 ug via INTRAVENOUS

## 2022-09-18 MED ORDER — LIDOCAINE 2% (20 MG/ML) 5 ML SYRINGE
INTRAMUSCULAR | Status: DC | PRN
  Administered 2022-09-18: 60 mg via INTRAVENOUS

## 2022-09-18 MED ORDER — DEXAMETHASONE SODIUM PHOSPHATE 4 MG/ML IJ SOLN
INTRAMUSCULAR | Status: DC | PRN
  Administered 2022-09-18: 4 mg via INTRAVENOUS

## 2022-09-18 MED ORDER — ACETAMINOPHEN 500 MG PO TABS
ORAL_TABLET | ORAL | Status: AC
  Filled 2022-09-18: qty 2

## 2022-09-18 MED ORDER — SODIUM CHLORIDE 0.9 % IV SOLN
INTRAVENOUS | Status: AC
  Filled 2022-09-18: qty 10

## 2022-09-18 MED ORDER — PROPOFOL 10 MG/ML IV BOLUS
INTRAVENOUS | Status: DC | PRN
  Administered 2022-09-18: 50 mg via INTRAVENOUS
  Administered 2022-09-18: 100 mg via INTRAVENOUS
  Administered 2022-09-18: 50 mg via INTRAVENOUS

## 2022-09-18 MED ORDER — ONDANSETRON HCL 4 MG/2ML IJ SOLN
INTRAMUSCULAR | Status: DC | PRN
  Administered 2022-09-18: 4 mg via INTRAVENOUS

## 2022-09-18 MED ORDER — OXYCODONE HCL 5 MG PO TABS: 5.0000 mg | ORAL_TABLET | Freq: Once | ORAL | Status: DC | PRN

## 2022-09-18 MED ORDER — FENTANYL CITRATE (PF) 100 MCG/2ML IJ SOLN
INTRAMUSCULAR | Status: AC
  Filled 2022-09-18: qty 2

## 2022-09-18 MED ORDER — CLINDAMYCIN PHOSPHATE 900 MG/50ML IV SOLN
INTRAVENOUS | Status: AC
  Filled 2022-09-18: qty 50

## 2022-09-18 MED ORDER — CLINDAMYCIN PHOSPHATE 900 MG/50ML IV SOLN
900.0000 mg | INTRAVENOUS | Status: AC
  Administered 2022-09-18: 900 mg via INTRAVENOUS

## 2022-09-18 MED ORDER — OXYCODONE HCL 5 MG/5ML PO SOLN: 5.0000 mg | Freq: Once | ORAL | Status: DC | PRN

## 2022-09-18 MED ORDER — BUPIVACAINE-EPINEPHRINE (PF) 0.25% -1:200000 IJ SOLN
INTRAMUSCULAR | Status: DC | PRN
  Administered 2022-09-18: 20 mL

## 2022-09-18 MED ORDER — PROPOFOL 500 MG/50ML IV EMUL
INTRAVENOUS | Status: DC | PRN
Start: 2022-09-18 — End: 2022-09-18
  Administered 2022-09-18: 200 ug/kg/min via INTRAVENOUS

## 2022-09-18 MED ORDER — GABAPENTIN 300 MG PO CAPS: 300.0000 mg | ORAL_CAPSULE | ORAL | Status: DC

## 2022-09-18 MED ORDER — ACETAMINOPHEN 500 MG PO TABS
1000.0000 mg | ORAL_TABLET | Freq: Once | ORAL | Status: AC
  Administered 2022-09-18: 1000 mg via ORAL

## 2022-09-18 MED ORDER — OXYCODONE HCL 5 MG PO TABS: 5.0000 mg | ORAL_TABLET | Freq: Four times a day (QID) | ORAL | 0 refills | Status: DC | PRN

## 2022-09-18 SURGICAL SUPPLY — 49 items
ADH SKN CLS APL DERMABOND .7 (GAUZE/BANDAGES/DRESSINGS) ×1
APL PRP STRL LF DISP 70% ISPRP (MISCELLANEOUS) ×1
APPLIER CLIP 9.375 MED OPEN (MISCELLANEOUS) ×1
APR CLP MED 9.3 20 MLT OPN (MISCELLANEOUS) ×1
BAG DECANTER FOR FLEXI CONT (MISCELLANEOUS) IMPLANT
BINDER BREAST LRG (GAUZE/BANDAGES/DRESSINGS) IMPLANT
BINDER BREAST MEDIUM (GAUZE/BANDAGES/DRESSINGS) IMPLANT
BINDER BREAST XLRG (GAUZE/BANDAGES/DRESSINGS) IMPLANT
BINDER BREAST XXLRG (GAUZE/BANDAGES/DRESSINGS) IMPLANT
BLADE SURG 15 STRL LF DISP TIS (BLADE) ×1 IMPLANT
BLADE SURG 15 STRL SS (BLADE) ×1
CANISTER SUC SOCK COL 7IN (MISCELLANEOUS) IMPLANT
CANISTER SUCT 1200ML W/VALVE (MISCELLANEOUS) IMPLANT
CHLORAPREP W/TINT 26 (MISCELLANEOUS) ×1 IMPLANT
CLIP APPLIE 9.375 MED OPEN (MISCELLANEOUS) IMPLANT
COVER BACK TABLE 60X90IN (DRAPES) ×1 IMPLANT
COVER MAYO STAND STRL (DRAPES) ×1 IMPLANT
COVER PROBE CYLINDRICAL 5X96 (MISCELLANEOUS) ×1 IMPLANT
DERMABOND ADVANCED .7 DNX12 (GAUZE/BANDAGES/DRESSINGS) ×1 IMPLANT
DRAPE LAPAROTOMY 100X72 PEDS (DRAPES) ×1 IMPLANT
DRAPE UTILITY XL STRL (DRAPES) ×1 IMPLANT
ELECT COATED BLADE 2.86 ST (ELECTRODE) ×1 IMPLANT
ELECT REM PT RETURN 9FT ADLT (ELECTROSURGICAL) ×1
ELECTRODE REM PT RTRN 9FT ADLT (ELECTROSURGICAL) ×1 IMPLANT
GLOVE BIOGEL PI IND STRL 8 (GLOVE) ×1 IMPLANT
GLOVE ECLIPSE 8.0 STRL XLNG CF (GLOVE) ×1 IMPLANT
GOWN STRL REUS W/ TWL LRG LVL3 (GOWN DISPOSABLE) ×2 IMPLANT
GOWN STRL REUS W/ TWL XL LVL3 (GOWN DISPOSABLE) ×1 IMPLANT
GOWN STRL REUS W/TWL LRG LVL3 (GOWN DISPOSABLE) ×2
GOWN STRL REUS W/TWL XL LVL3 (GOWN DISPOSABLE) ×1
HEMOSTAT ARISTA ABSORB 3G PWDR (HEMOSTASIS) IMPLANT
HEMOSTAT SNOW SURGICEL 2X4 (HEMOSTASIS) IMPLANT
KIT MARKER MARGIN INK (KITS) ×1 IMPLANT
NDL HYPO 25X1 1.5 SAFETY (NEEDLE) ×1 IMPLANT
NEEDLE HYPO 25X1 1.5 SAFETY (NEEDLE) ×1
NS IRRIG 1000ML POUR BTL (IV SOLUTION) ×1 IMPLANT
PACK BASIN DAY SURGERY FS (CUSTOM PROCEDURE TRAY) ×1 IMPLANT
PENCIL SMOKE EVACUATOR (MISCELLANEOUS) ×1 IMPLANT
SLEEVE SCD COMPRESS KNEE MED (STOCKING) ×1 IMPLANT
SPIKE FLUID TRANSFER (MISCELLANEOUS) IMPLANT
SPONGE T-LAP 4X18 ~~LOC~~+RFID (SPONGE) ×1 IMPLANT
SUT MNCRL AB 4-0 PS2 18 (SUTURE) ×1 IMPLANT
SUT SILK 2 0 SH (SUTURE) IMPLANT
SUT VICRYL 3-0 CR8 SH (SUTURE) ×1 IMPLANT
SYR CONTROL 10ML LL (SYRINGE) ×1 IMPLANT
TOWEL GREEN STERILE FF (TOWEL DISPOSABLE) ×1 IMPLANT
TRAY FAXITRON CT DISP (TRAY / TRAY PROCEDURE) ×1 IMPLANT
TUBE CONNECTING 20X1/4 (TUBING) IMPLANT
YANKAUER SUCT BULB TIP NO VENT (SUCTIONS) IMPLANT

## 2022-09-18 NOTE — Transfer of Care (Signed)
Immediate Anesthesia Transfer of Care Note  Patient: Michelle Hubbard  Procedure(s) Performed: LEFT BREAST SEED LUMPECTOMY (Left: Breast)  Patient Location: PACU  Anesthesia Type:General  Level of Consciousness: awake, alert , and oriented  Airway & Oxygen Therapy: Patient Spontanous Breathing  Post-op Assessment: Report given to RN and Post -op Vital signs reviewed and stable  Post vital signs: Reviewed and stable  Last Vitals:  Vitals Value Taken Time  BP    Temp    Pulse 87 09/18/22 1318  Resp 24 09/18/22 1318  SpO2 100 % 09/18/22 1318  Vitals shown include unfiled device data.  Last Pain:  Vitals:   09/18/22 1122  TempSrc: Temporal  PainSc: 0-No pain      Patients Stated Pain Goal: 3 (09/18/22 1122)  Complications: No notable events documented.

## 2022-09-18 NOTE — Anesthesia Procedure Notes (Signed)
Procedure Name: LMA Insertion Date/Time: 09/18/2022 12:30 PM  Performed by: Burna Cash, CRNAPre-anesthesia Checklist: Patient identified, Emergency Drugs available, Suction available and Patient being monitored Patient Re-evaluated:Patient Re-evaluated prior to induction Oxygen Delivery Method: Circle system utilized Preoxygenation: Pre-oxygenation with 100% oxygen Induction Type: IV induction Ventilation: Mask ventilation without difficulty LMA: LMA inserted LMA Size: 4.0 Number of attempts: 1 Airway Equipment and Method: Bite block Placement Confirmation: positive ETCO2 Tube secured with: Tape Dental Injury: Teeth and Oropharynx as per pre-operative assessment

## 2022-09-18 NOTE — H&P (Signed)
History of Present Illness: Michelle Hubbard is a 77 y.o. female who is seen today as an office consultation for evaluation of Breast Cancer  77 YO SDM mass left breast lower outer quadrant IDC ER POS PR POS HER 2 NEU POS   No hx of mass, pain or discharge   Had previous wire loc bx in past for benign disease as far as she can remember   Review of Systems: A complete review of systems was obtained from the patient. I have reviewed this information and discussed as appropriate with the patient. See HPI as well for other ROS.    Medical History: Past Medical History:  Diagnosis Date  Anxiety  Asthma, unspecified asthma severity, unspecified whether complicated, unspecified whether persistent (HHS-HCC)  COPD (chronic obstructive pulmonary disease) (CMS/HHS-HCC)  Hyperlipidemia   Patient Active Problem List  Diagnosis  Asthma-COPD overlap syndrome  Anxiety  COPD (chronic obstructive pulmonary disease) (CMS/HHS-HCC)  Malignant neoplasm of upper-outer quadrant of left breast in female, estrogen receptor positive (CMS/HHS-HCC)   Past Surgical History:  Procedure Laterality Date  COLON SURGERY N/A 06/03/2013  Colon Resection, Colon Surgery 2000  Left Breast Biopsy Left 08/18/2022  APPENDECTOMY N/A  1988  HYSTERECTOMY    Allergies  Allergen Reactions  Hydrocodone-Acetaminophen Hives  Codeine Nausea, Other (See Comments) and Rash  REACTION: nausea  Penicillins Hives and Rash   Current Outpatient Medications on File Prior to Visit  Medication Sig Dispense Refill  amLODIPine (NORVASC) 5 MG tablet Take 1 tablet by mouth once daily  aspirin 81 MG EC tablet Take 81 mg by mouth once daily  metoprolol succinate (TOPROL-XL) 25 MG XL tablet Take 0.5 tablets by mouth once daily 1/2 tablet once daily with or immediately following a meal  rosuvastatin (CRESTOR) 20 MG tablet Take 1 tablet by mouth once daily  levocetirizine (XYZAL) 5 MG tablet Take 5 mg by mouth every evening   No  current facility-administered medications on file prior to visit.   Family History  Problem Relation Age of Onset  Coronary Artery Disease (Blocked arteries around heart) Mother  Colon cancer Mother  Skin cancer Brother  Colon cancer Brother    Social History   Tobacco Use  Smoking Status Every Day  Types: Cigarettes  Smokeless Tobacco Never  Tobacco Comments  Cigarettes, 1 pack a day    Social History   Socioeconomic History  Marital status: Widowed  Tobacco Use  Smoking status: Every Day  Types: Cigarettes  Smokeless tobacco: Never  Tobacco comments:  Cigarettes, 1 pack a day  Vaping Use  Vaping status: Never Used  Substance and Sexual Activity  Alcohol use: Yes  Drug use: Never   Social Determinants of Health   Financial Resource Strain: Low Risk (08/20/2021)  Received from [facility]  Overall Financial Resource Strain (CARDIA)  Difficulty of Paying Living Expenses: Not hard at all  Food Insecurity: No Food Insecurity (08/20/2021)  [facility]  Hunger Vital Sign  Worried About Running Out of Food in the Last Year: Never true  Ran Out of Food in the Last Year: Never true  Transportation Needs: No Transportation Needs (08/20/2021)  Received from [facility] - Transportation  Lack of Transportation (Medical): No  Lack of Transportation (Non-Medical): No  Physical Activity: Insufficiently Active (08/20/2021)  Received from [facility]  Exercise Vital Sign  Days of Exercise per Week: 1 day  Minutes of Exercise per Session: 120 min  Stress: No Stress Concern Present (08/20/2021)  Received  from [facility] - Occupational Stress Questionnaire  Feeling of Stress : Only a little  Social Connections: Moderately Isolated (08/20/2021)  Received from [facility]  Social Connection and Isolation Panel [NHANES]  Frequency of Communication with Friends and Family: More than three times a week  Frequency of  Social Gatherings with Friends and Family: Three times a week  Attends Religious Services: More than 4 times per year  Active Member of Clubs or Organizations: No  Attends Banker Meetings: Never  Marital Status: Widowed   Objective:  There were no vitals filed for this visit.  There is no height or weight on file to calculate BMI.  Physical Exam Exam conducted with a chaperone present.  HENT:  Head: Normocephalic.  Cardiovascular:  Rate and Rhythm: Normal rate.  Chest:  Breasts: Right: Normal. No mass or tenderness.  Left: Tenderness present. No mass.   Comments: bruising Musculoskeletal:  General: Normal range of motion.  Lymphadenopathy:  Upper Body:  Right upper body: No supraclavicular or axillary adenopathy.  Left upper body: No supraclavicular or axillary adenopathy.  Skin: General: Skin is warm.  Neurological:  General: No focal deficit present.  Mental Status: She is alert.  Psychiatric:  Mood and Affect: Mood normal.     Labs, Imaging and Diagnostic Testing:  Diagnosis Breast, left, needle core biopsy, 12:00 INVASIVE DUCTAL CARCINOMA, SEE NOTE NO DUCTAL CARCINOMA IN SITU IDENTIFIED TUBULE FORMATION: SCORE 3 NUCLEAR PLEOMORPHISM: SCORE 2 MITOTIC COUNT: SCORE 1 TOTAL SCORE: 6/9 OVERALL GRADE: 2/3 LYMPHOVASCULAR INVASION: ABSENT CANCER LENGTH: 0.6 CM CALCIFICATIONS: ABSENT OTHER FINDINGS: NONE SEE NOTE Diagnosis Note Sections show invasive tumor cells in a nested pattern with angular features and surrounding stromal retraction. The features are suggestive of but not diagnostic for micropapillary features. Definitive classification should be performed on the resection specimen. Dr. Venetia Night reviewed the case and concurs with the interpretation. A breast prognostic profile (ER, PR, Ki-67 and HER2) is pending and will be reported in an addendum. The [facility] was notified on 08/19/2022. Michelle Hubbard  M.D. Pathologist (Case signed 08/19/2022) Specimen Gross and Clinical Information 1 of 3 FINAL for Michelle Hubbard [phone]) Specimen Comment TIF: 1:10 pm, CIT: < 30 sec, mass left breast Specimen(s) Obtained: Breast, left, needle core biopsy, 12:00 Specimen Clinical Information Expect CA left breast Gross Received in formalin labeled "[place]" and "Lt br 12 o'c" (TIF 1310 CIT <30 secs) are 4 cores of gray-white to yellow soft to firm tissue which range from 0.8 x 0.2 x 0.2 cm to 1.5 x 0.2 x 0.2 cm, submitted in 1 block. (SW 08/18/2022) Stain(s) used in Diagnosis: The following stain(s) were used in diagnosing the case: ER-ACIS, PR-ACIS, Her2 by IHC, KI-67-ACIS. The control(s) stained appropriately. ADDITIONAL INFORMATION: Breast, left, needle core biopsy, 12:00 PROGNOSTIC INDICATORS Results: IMMUNOHISTOCHEMICAL AND MORPHOMETRIC ANALYSIS PERFORMED MANUALLY The tumor cells are positive for Her2 (3+). Estrogen Receptor: 95%, POSITIVE, STRONG STAINING INTENSITY Progesterone Receptor: 0%, NEGATIVE Proliferation Marker Ki67: 15% COMMENT: The negative hormone receptor study(ies) in this case has an internal positive control. REFERENCE RANGE ESTROGEN RECEPTOR NEGATIVE 0% POSITIVE =>1% REFERENCE RANGE PROGESTERONE RECEPTOR NEGATIVE 0% POSITIVE =>1% All controls stained appropriately Marlena Clipper MD Pathologist, Electronic Signature ( Signed 08/20/2022) 2 of 3 FINAL for Nored, Cadence K [phone]) Disclaimer IHC scores are reported using ASCO/CAP scoring criteria. An IHC Score of 0 or 1+ is NEGATIVE for HER2, 3+ is POSITIVE for HER2, and 2+ is EQUIVOCAL. Equivocal results  are reflexed to either FISH or IHC testing. Specimens are fixed in 10% Neutral Buffered Formalin for at least 6 hours and up to 72 hours. These tests have not be validated on decalcified tissue. Results should be interpreted with caution given the possibility of false negative results on decalcified  specimens. Antibody Clone for HER2 is 4B5 (PATHWAY). Some of these immunohistochemical stains may have been developed and the performance characteristics determined by [facility]. Some may not have been cleared or approved by the U.S. Food and Drug Administration. The FDA has determined that such clearance or approval is not necessary. This test is used for clinical purposes. It should not be regarded as investigational or for research. This laboratory is certified under the Clinical Laboratory Improvement Amendments of 1988 (CLIA-88) as qualified to perform high complexity clinical laboratory testing. Estrogen receptor (6F11), immunohistochemical stains are performed on formalin fixed, paraffin embedded tissue using a 3,3"-diaminobenzidine (DAB) chromogen and Leica Bond Autostainer System. The staining intensity of the nucleus is scored manually and is reported as the percentage of tumor cell nuclei demonstrating specific nuclear staining.Specimens are fixed in 10% Neutral Buffered Formalin for at least 6 hours and up to 72 hours. These tests have not be validated on decalcified tissue. Results should be interpreted with caution given the possibility of false negative results on decalcified specimens. Ki-67 (MM1), immunohistochemical stains are performed on formalin fixed, paraffin embedded tissue using a 3,3"-diaminobenzidine (DAB) chromogen and Leica Bond Autostainer System. The staining intensity of the nucleus is scored manually and is reported as the percentage of tumor cell nuclei demonstrating specific nuclear staining.Specimens are fixed in 10% Neutral Buffered Formalin for at least 6 hours and up to 72 hours. These tests have not be validated on decalcified tissue. Results should be interpreted with caution given the possibility of false negative results on decalcified specimens. PR progesterone receptor (16), immunohistochemical stains are performed on formalin fixed, paraffin  embedded tissue using a 3,3"-diaminobenzidine (DAB) chromogen and Leica Bond Autostainer System. The staining intensity of the nucleus is scored manually and is reported as the percentage of tumor cell nuclei demonstrating specific nuclear staining.Specimens are fixed in 10% Neutral Buffered Formalin for at least 6 hours and up to 72 hours. These tests have not be validated on decalcified tissue. Results should be interpreted with caution given the possibility of false negative results on decalcified specimens. Report signed out from the following location(s) Technical component and interpretation was performed at [facility]. [address],[place] [address].CLIA:[id number],CAP:[id number]. ,Narrative  CLINICAL DATA: Screening recall for possible left breast mass with associated distortion.  EXAM: DIGITAL DIAGNOSTIC UNILATERAL LEFT MAMMOGRAM WITH TOMOSYNTHESIS AND CAD; ULTRASOUND LEFT BREAST LIMITED  TECHNIQUE: Left digital diagnostic mammography and breast tomosynthesis was performed. The images were evaluated with computer-aided detection. ; Targeted ultrasound examination of the left breast was performed.  COMPARISON: Previous exam(s).  ACR Breast Density Category b: There are scattered areas of fibroglandular density.  FINDINGS: Spot compression tomograms were performed upper central left breast demonstrating an irregular spiculated mass measuring 1.1 cm. There is a smaller possibly separate similar appearing mass immediate plea adjacent measuring 0.4 cm.  Targeted ultrasound of the left breast was performed. There is an irregular hypoechoic mass in the left breast at 12 o'clock 1 cm from nipple measuring 0.9 x 1 x 1.3 cm. This corresponds well with the mass seen in the left breast at mammography. No abnormal lymph nodes identified in the left axilla.  IMPRESSION: Suspicious 1.3 cm mass  in the left breast at the 12  o'clock position.  RECOMMENDATION: Recommend ultrasound-guided core biopsy of the mass in the left breast at the 12 o'clock position. The findings were discussed with both the patient and her daughter who was present in the room.  I have discussed the findings and recommendations with the patient. If applicable, a reminder letter will be sent to the patient regarding the next appointment.  BI-RADS CATEGORY 5: Highly suggestive of malignancy.  Electronically Signed By: Edwin Cap M.D. On: 08/14/2022 12:11 Exam End: 08/14/22 12:15 Last Resulted: 08/14/22 12:15  Received From: [facility]   Assessment and Plan:   Diagnoses and all orders for this visit:  Malignant neoplasm of upper-outer quadrant of left breast in female, estrogen receptor positive (CMS/HHS-HCC)   Discussed BCS vs mastectomy and reconstruction with pt and daughter today  Discuss LRR, SURVIVAL AND QUALITY OF LIFE ISSUES   DISCUSSED MARKERS AND NEED FOR RECHECK OF THIS ON FINAL SPECIMEN  DISCUSSED SLN MAPPING IN THIS CASE  She has chosen left breast seed lumpectomy and no SLN mapping at this point  Reviewed with the team and agree it can be not performed in this case since unlikely to change outcome and concerns over LE   The procedure has been discussed with the patient. Alternatives to surgery have been discussed with the patient. Risks of surgery include bleeding, Infection, Seroma formation, death, and the need for further surgery. The patient understands and wishes to proceed.    Hayden Rasmussen, MD

## 2022-09-18 NOTE — Discharge Instructions (Addendum)
[facility] Office Phone Number [phone]  BREAST BIOPSY/ PARTIAL MASTECTOMY: POST OP INSTRUCTIONS  Always review your discharge instruction sheet given to you by the facility where your surgery was performed.  IF YOU HAVE DISABILITY OR FAMILY LEAVE FORMS, YOU MUST BRING THEM TO THE OFFICE FOR PROCESSING.  DO NOT GIVE THEM TO YOUR DOCTOR.  A prescription for pain medication may be given to you upon discharge.  Take your pain medication as prescribed, if needed.  If narcotic pain medicine is not needed, then you may take acetaminophen (Tylenol) or ibuprofen (Advil) as needed. Take your usually prescribed medications unless otherwise directed If you need a refill on your pain medication, please contact your pharmacy.  They will contact our office to request authorization.  Prescriptions will not be filled after 5pm or on week-ends. You should eat very light the first 24 hours after surgery, such as soup, crackers, pudding, etc.  Resume your normal diet the day after surgery. Most patients will experience some swelling and bruising in the breast.  Ice packs and a good support bra will help.  Swelling and bruising can take several days to resolve.  It is common to experience some constipation if taking pain medication after surgery.  Increasing fluid intake and taking a stool softener will usually help or prevent this problem from occurring.  A mild laxative (Milk of Magnesia or Miralax) should be taken according to package directions if there are no bowel movements after 48 hours. Unless discharge instructions indicate otherwise, you may remove your bandages 24-48 hours after surgery, and you may shower at that time.  You may have steri-strips (small skin tapes) in place directly over the incision.  These strips should be left on the skin for 7-10 days.  If your surgeon used skin glue on the incision, you may shower in 24 hours.  The glue will flake off over the next 2-3 weeks.  Any  sutures or staples will be removed at the office during your follow-up visit. ACTIVITIES:  You may resume regular daily activities (gradually increasing) beginning the next day.  Wearing a good support bra or sports bra minimizes pain and swelling.  You may have sexual intercourse when it is comfortable. You may drive when you no longer are taking prescription pain medication, you can comfortably wear a seatbelt, and you can safely maneuver your car and apply brakes. RETURN TO WORK:  ______________________________________________________________________________________ Bonita Quin should see your doctor in the office for a follow-up appointment approximately two weeks after your surgery.  Your doctor's nurse will typically make your follow-up appointment when she calls you with your pathology report.  Expect your pathology report 2-3 business days after your surgery.  You may call to check if you do not hear from [place] after three days. OTHER INSTRUCTIONS: _______________________________________________________________________________________________ _____________________________________________________________________________________________________________________________________ _____________________________________________________________________________________________________________________________________ _____________________________________________________________________________________________________________________________________  WHEN TO CALL YOUR DOCTOR: Fever over 101.0 Nausea and/or vomiting. Extreme swelling or bruising. Continued bleeding from incision. Increased pain, redness, or drainage from the incision.  The clinic staff is available to answer your questions during regular business hours.  Please don't hesitate to call and ask to speak to one of the nurses for clinical concerns.  If you have a medical emergency, go to the nearest emergency room or call 911.  A surgeon from [facility] is always on call at the hospital.  For further questions, please visit [email or web address]     Post Anesthesia Home Care Instructions  Activity: Get plenty of rest for the remainder  of the day. A responsible individual must stay with you for 24 hours following the procedure.  For the next 24 hours, DO NOT: -Drive a car -Advertising copywriter -Drink alcoholic beverages -Take any medication unless instructed by your physician -Make any legal decisions or sign important papers.  Meals: Start with liquid foods such as gelatin or soup. Progress to regular foods as tolerated. Avoid greasy, spicy, heavy foods. If nausea and/or vomiting occur, drink only clear liquids until the nausea and/or vomiting subsides. Call your physician if vomiting continues.  Special Instructions/Symptoms: Your throat may feel dry or sore from the anesthesia or the breathing tube placed in your throat during surgery. If this causes discomfort, gargle with warm salt water. The discomfort should disappear within 24 hours.  If you had a scopolamine patch placed behind your ear for the management of post- operative nausea and/or vomiting:  1. The medication in the patch is effective for 72 hours, after which it should be removed.  Wrap patch in a tissue and discard in the trash. Wash hands thoroughly with soap and water. 2. You may remove the patch earlier than 72 hours if you experience unpleasant side effects which may include dry mouth, dizziness or visual disturbances. 3. Avoid touching the patch. Wash your hands with soap and water after contact with the patch.    *May have Tylenol today at 5:30pm

## 2022-09-18 NOTE — Anesthesia Preprocedure Evaluation (Addendum)
Anesthesia Evaluation  Patient identified by MRN, date of birth, ID band Patient awake    Reviewed: Allergy & Precautions, NPO status , Patient's Chart, lab work & pertinent test results  History of Anesthesia Complications (+) PONV and history of anesthetic complications  Airway Mallampati: II  TM Distance: >3 FB Neck ROM: Full   Comment: Previous grade I view with Miller 2, mask with OPA Dental  (+) Upper Dentures   Pulmonary asthma , COPD, Current Smoker   Pulmonary exam normal breath sounds clear to auscultation       Cardiovascular hypertension (amlodipine, metoprolol), Pt. on medications and Pt. on home beta blockers (-) angina + CAD  (-) Past MI, (-) Cardiac Stents and (-) CABG (-) dysrhythmias  Rhythm:Regular Rate:Normal  HLD   Neuro/Psych  PSYCHIATRIC DISORDERS Anxiety Depression       GI/Hepatic ,GERD  Medicated,,  Endo/Other    Renal/GU  Bladder dysfunction      Musculoskeletal  (+) Arthritis ,  osteoporosis   Abdominal   Peds  Hematology   Anesthesia Other Findings   Reproductive/Obstetrics Left breast cancer                             Anesthesia Physical Anesthesia Plan  ASA: 3  Anesthesia Plan: General   Post-op Pain Management: Tylenol PO (pre-op)*   Induction: Intravenous  PONV Risk Score and Plan: 3 and Ondansetron, Dexamethasone, Treatment may vary due to age or medical condition, TIVA and Propofol infusion  Airway Management Planned: LMA  Additional Equipment:   Intra-op Plan:   Post-operative Plan: Extubation in OR  Informed Consent: I have reviewed the patients History and Physical, chart, labs and discussed the procedure including the risks, benefits and alternatives for the proposed anesthesia with the patient or authorized representative who has indicated his/her understanding and acceptance.     Dental advisory given  Plan Discussed with:  CRNA and Anesthesiologist  Anesthesia Plan Comments: (Risks of general anesthesia discussed including, but not limited to, sore throat, hoarse voice, chipped/damaged teeth, injury to vocal cords, nausea and vomiting, allergic reactions, lung infection, heart attack, stroke, and death. All questions answered. )        Anesthesia Quick Evaluation

## 2022-09-18 NOTE — Op Note (Signed)
Preoperative diagnosis: Stage I left breast cancer overlapping sites  Postoperative diagnosis: Same  Procedure: Left breast seed localized lumpectomy  Surgeon: Harriette Bouillon, MD  Anesthesia: LMA with 0.25% Marcaine with epinephrine  EBL: Minimal  Specimen: Left breast tissue seed and clip verified by Faxitron plus additional superior margin.  All tissue was oriented with ink and sent to pathology.  Drains: None  Indications for procedure: The patient is a 77 year old female with stage I left breast cancer.  She is chosen breast conserving surgery after being evaluated in the multidisciplinary clinic.  Risks and benefits of surgery were reviewed as well as different surgical options and long-term expectations of each, complications of each and local regional recurrence as well as survival of each.  She is agreed to proceed with left breast lumpectomy alone.  She had concerns about lymphedema and given her advanced age as well as favorable tumor characteristics, we felt that omission of the sentinel node is a reasonable option for her.The procedure has been discussed with the patient. Alternatives to surgery have been discussed with the patient.  Risks of surgery include bleeding,  Infection,  Seroma formation, death,  and the need for further surgery.   The patient understands and wishes to proceed.     Description of procedure: The patient was met in the holding area and questions were answered.  After induction of general anesthesia, left breast was prepped and draped in a sterile fashion and timeout performed.  Proper patient, site and procedure were verified.  Neoprobe was used to identify the seed in the left breast.  A curvilinear incision was made along the superior border of the nipple areolar complex.  Dissection was carried down.  All tissue around the seed and clip were excised to grossly negative margin.  Additional superior margin tissue was removed.  The imaging revealed the seed  and clip to be present and the specimen is oriented with ink.  The cavity is irrigated.  Local anesthetic infiltrated.  Deep tissue planes were closed with 3-0 Vicryl.  4 Monocryl used to close the skin in a subcuticular fashion.  Dermabond applied.  All counts found to be correct.  The patient was awoke extubated taken to recovery in satisfactory condition.

## 2022-09-18 NOTE — Interval H&P Note (Signed)
History and Physical Interval Note:  09/18/2022 12:14 PM  Michelle Hubbard  has presented today for surgery, with the diagnosis of LEFT BREAST CANCER.  The various methods of treatment have been discussed with the patient and family. After consideration of risks, benefits and other options for treatment, the patient has consented to  Procedure(s): LEFT BREAST SEED LUMPECTOMY (Left) as a surgical intervention.  The patient's history has been reviewed, patient examined, no change in status, stable for surgery.  I have reviewed the patient's chart and labs.  Questions were answered to the patient's satisfaction.   The procedure has been discussed with the patient. Alternatives to surgery have been discussed with the patient.  Risks of surgery include bleeding,  Infection,  Seroma formation, death,  and the need for further surgery.   The patient understands and wishes to proceed.   Danita Proud A Clennon Nasca

## 2022-09-18 NOTE — Anesthesia Postprocedure Evaluation (Signed)
Anesthesia Post Note  Patient: Michelle Hubbard  Procedure(s) Performed: LEFT BREAST SEED LUMPECTOMY (Left: Breast)     Patient location during evaluation: PACU Anesthesia Type: General Level of consciousness: awake Pain management: pain level controlled Vital Signs Assessment: post-procedure vital signs reviewed and stable Respiratory status: spontaneous breathing, nonlabored ventilation and respiratory function stable Cardiovascular status: blood pressure returned to baseline and stable Postop Assessment: no apparent nausea or vomiting Anesthetic complications: no   No notable events documented.  Last Vitals:  Vitals:   09/18/22 1330 09/18/22 1345  BP: 117/63 118/61  Pulse: 75 77  Resp: (!) 25 (!) 22  Temp:    SpO2: 95% 92%    Last Pain:  Vitals:   09/18/22 1345  TempSrc:   PainSc: 5                  Linton Rump

## 2022-09-19 ENCOUNTER — Encounter (HOSPITAL_BASED_OUTPATIENT_CLINIC_OR_DEPARTMENT_OTHER): Payer: Self-pay | Admitting: Surgery

## 2022-09-22 ENCOUNTER — Encounter: Payer: Self-pay | Admitting: Surgery

## 2022-09-22 ENCOUNTER — Encounter: Payer: Self-pay | Admitting: Cardiovascular Disease

## 2022-09-22 MED ORDER — METOPROLOL SUCCINATE ER 25 MG PO TB24: ORAL_TABLET | ORAL | 1 refills | Status: DC

## 2022-09-23 ENCOUNTER — Telehealth: Payer: Self-pay | Admitting: Hematology and Oncology

## 2022-09-23 ENCOUNTER — Encounter: Payer: Self-pay | Admitting: *Deleted

## 2022-09-23 NOTE — Telephone Encounter (Signed)
 Rescheduled appointment per provider PAL. Left voicemail with appointment details.

## 2022-09-30 ENCOUNTER — Ambulatory Visit: Payer: Medicare Other | Admitting: Hematology and Oncology

## 2022-10-01 ENCOUNTER — Encounter: Payer: Self-pay | Admitting: *Deleted

## 2022-10-01 DIAGNOSIS — Z170 Estrogen receptor positive status [ER+]: Secondary | ICD-10-CM

## 2022-10-02 LAB — SURGICAL PATHOLOGY

## 2022-10-03 NOTE — Progress Notes (Addendum)
Location of Breast Cancer: Malignant neoplasm of upper-outer quadrant of left breast in female, estrogen receptor positive, DCIS left breast  Histology per Pathology Report:  09/18/2022  FINAL MICROSCOPIC DIAGNOSIS:  A. 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 BREAST, LEFT ADDITIONAL SUPERIOR MARGIN, EXCISION:      Benign breast tissue with stromal fibrosis.      Negative for atypia or malignancy.  ONCOLOGY TABLE:  INVASIVE CARCINOMA OF THE BREAST:  Resection  Procedure: Lumpectomy Specimen Laterality: Left Histologic Type: Invasive ductal carcinoma with micropapillary features Histologic Grade:      Glandular (Acinar)/Tubular Differentiation: 3      Nuclear Pleomorphism: 2      Mitotic Rate: 1      Overall Grade: 2 Tumor Size: 1.2 cm Ductal Carcinoma In Situ: Cribriform type, intermediate grade Lymphatic and/or Vascular Invasion:  Not identified Treatment Effect in the Breast: No known presurgical therapy Margins: All margins negative for invasive carcinoma      Distance from Closest Margin (mm): 1 mm from the posterior margin      Specify Closest Margin (required only if <54mm): Posterior margin DCIS Margins: Uninvolved by DCIS      Distance from Closest Margin (mm): 5 mm from the posterior margin      Specify Closest Margin (required only if <67mm): Posterior  margin Regional Lymph Nodes: Not applicable (no lymph nodes submitted or found)      Number of Lymph Nodes Examined: 0      Number of Sentinel Nodes Examined: 0      Number of Lymph Nodes with Macrometastases (>2 mm): NA      Number of Lymph Nodes with Micrometastases: NA      Number of Lymph Nodes with Isolated Tumor Cells (=0.2 mm or =200 cells): NA      Size of Largest Metastatic Deposit (mm): NA      Extranodal Extension: NA Distant Metastasis:      Distant Site(s) Involved: Not applicable Breast Biomarker Testing Performed on Previous Biopsy:      Testing Performed on Case Number: [id number]            Estrogen Receptor: 95%, positive, strong staining intensity            Progesterone Receptor: 0%, negative            HER2: Positive by FISH            Ki-67: 15% Pathologic Stage Classification (pTNM, AJCC 8th Edition): pT1c, pNx Representative Tumor Block: A3, A4, A5 Comment(s): None (v4.5.0.0)   Receptor Status: ER(95%), PR (0%), Her2-neu (Positive by FISH), Ki-67(15%)  Did patient present with symptoms (if so, please note symptoms) or was this found on screening mammography?: screening mammogram  Past/Anticipated interventions by surgeon, if any: 09-18-22 Procedure: Left breast seed localized  lumpectomy   Surgeon: Harriette Bouillon, MD  Assessment and Plan:   Diagnoses and all orders for this visit:  Malignant neoplasm of upper-outer quadrant of left breast in female, estrogen receptor positive (CMS/HHS-HCC)   Discussed BCS vs mastectomy and reconstruction with pt and daughter today  Discuss LRR, SURVIVAL AND QUALITY OF LIFE ISSUES   DISCUSSED MARKERS AND NEED FOR RECHECK OF THIS ON FINAL SPECIMEN  DISCUSSED SLN MAPPING IN THIS CASE  She has chosen left breast seed lumpectomy and no SLN mapping at this point  Reviewed with the team and agree it can be not performed in this case since unlikely to change outcome and concerns over LE   Past/Anticipated  interventions by medical oncology, if any:  08/27/2022  Malignant neoplasm of upper-outer quadrant of left breast in female, estrogen receptor positive (HCC)  08/18/2022 Initial Diagnosis    Screening mammogram detected left breast mass upper central 4 o'clock position: 1.3 cm, axilla negative, biopsy: Grade 2 IDC ER 95% PR 0% Ki67 15%, HER2 3+ positive    ASSESSMENT AND PLAN:  Malignant neoplasm of upper-outer quadrant of left breast in female, estrogen receptor positive (HCC) 08/18/2022: Screening mammogram detected left breast mass upper central 4 o'clock position: 1.3 cm, axilla negative, biopsy: Grade 2 IDC ER 95% PR 0% Ki67 15%, HER2 3+ positive   Pathology and radiology counseling: Discussed with the patient, the details of pathology including the type of breast cancer,the clinical staging, the significance of ER, PR and HER-2/neu receptors and the implications for treatment. After reviewing the pathology in detail, we proceeded to discuss the different treatment options between surgery, radiation, chemotherapy, antiestrogen therapies.   Treatment plan: Breast conserving surgery (foregoing sentinel lymph node biopsy) Adjuvant anti-HER2 therapy if the final pathology is truly HER2 positive.  We did not recommend chemo. Adjuvant radiation Followed by antiestrogen therapy with anastrozole 1 mg daily x 5 years   Return to clinic after surgery to discuss final pathology report and make a determination if she could receive injection Herceptin.  I do not believe she would be a good candidate for Taxol.  Lymphedema issues, if any:  not applicable  Pain issues, if any:  no pain to report  SAFETY ISSUES: Prior radiation? no Pacemaker/ICD? no Possible current pregnancy?no Is the patient on methotrexate? no  Current Complaints / other details:  Pt is now Her 2 marker confirmed and will develop a plan for this with medical oncology. She wants to know if she can wait for radiation. She is  feeling well and wants to continue to feel well.      Vitals:   10/17/22 1231  BP: 130/74  Pulse: 82  Resp: 18  Temp: 97.7 F (36.5 C)  SpO2: 93%

## 2022-10-06 ENCOUNTER — Inpatient Hospital Stay: Payer: Medicare PPO | Attending: Licensed Clinical Social Worker | Admitting: Hematology and Oncology

## 2022-10-06 VITALS — BP 132/72 | HR 88 | Temp 97.7°F | Resp 20 | Wt 124.7 lb

## 2022-10-06 DIAGNOSIS — Z170 Estrogen receptor positive status [ER+]: Secondary | ICD-10-CM | POA: Diagnosis not present

## 2022-10-06 DIAGNOSIS — I427 Cardiomyopathy due to drug and external agent: Secondary | ICD-10-CM

## 2022-10-06 DIAGNOSIS — Z780 Asymptomatic menopausal state: Secondary | ICD-10-CM | POA: Diagnosis not present

## 2022-10-06 DIAGNOSIS — C50412 Malignant neoplasm of upper-outer quadrant of left female breast: Secondary | ICD-10-CM | POA: Diagnosis not present

## 2022-10-06 NOTE — Assessment & Plan Note (Signed)
08/18/2022: Screening mammogram detected left breast mass upper central 4 o'clock position: 1.3 cm, axilla negative, biopsy: Grade 2 IDC ER 95% PR 0% Ki67 15%, HER2 3+ positive 09/18/2022: Left lumpectomy: Grade 2 IDC 1.2 cm with DCIS intermediate grade, margins negative, LVI not present, ER 100%, PR 0%, HER2 positive by FISH ratio 3, copy #4.95, Ki-67 10%  Pathology counseling: I discussed the final pathology report of the patient provided  a copy of this report. I discussed the margins as well as lymph node surgeries. We also discussed the final staging along with previously performed ER/PR and HER-2/neu testing.  Treatment plan: Adjuvant Herceptin (patient is not a candidate for chemo) Adjuvant radiation therapy Followed by adjuvant antiestrogen therapy with anastrozole 1 mg daily x 5 years  Return to clinic to start Herceptin injections

## 2022-10-06 NOTE — Progress Notes (Signed)
Patient Care Team: Corwin Levins, MD as PCP - General Croitoru, Rachelle Hora, MD as PCP - Cardiology (Cardiology) Pershing Proud, RN as Oncology Nurse Navigator Donnelly Angelica, RN as Oncology Nurse Navigator Serena Croissant, MD as Consulting Physician (Hematology and Oncology) Harriette Bouillon, MD as Consulting Physician (General Surgery) Lonie Peak, MD as Attending Physician (Radiation Oncology) Yvonne Kendall, MD as Referring Physician (Ophthalmology)  DIAGNOSIS:  Encounter Diagnoses  Name Primary?   Malignant neoplasm of upper-outer quadrant of left breast in female, estrogen receptor positive (HCC) Yes   Postmenopausal    Cardiotoxicity (HCC)     SUMMARY OF ONCOLOGIC HISTORY: Oncology History  Malignant neoplasm of upper-outer quadrant of left breast in female, estrogen receptor positive (HCC)  08/18/2022 Initial Diagnosis   Screening mammogram detected left breast mass upper central 4 o'clock position: 1.3 cm, axilla negative, biopsy: Grade 2 IDC ER 95% PR 0% Ki67 15%, HER2 3+ positive   08/27/2022 Cancer Staging   Staging form: Breast, AJCC 8th Edition - Clinical stage from 08/27/2022: Stage IA (cT1c, cN0, cM0, G2, ER+, PR-, HER2+) - Signed by Serena Croissant, MD on 08/27/2022 Stage prefix: Initial diagnosis Histologic grading system: 3 grade system Laterality: Left Staged by: Pathologist and managing physician Stage used in treatment planning: Yes National guidelines used in treatment planning: Yes Type of national guideline used in treatment planning: NCCN     CHIEF COMPLIANT: Follow-up after surgery  INTERVAL HISTORY: SHEMICA HUSS is a 77 y.o. female is here because of recent diagnosis of left breast cancer. Patient reports she did good with surgery. She denies any pain, but says she is sore.   ALLERGIES:  is allergic to codeine, lortab [hydrocodone-acetaminophen], and penicillins.  MEDICATIONS:  Current Outpatient Medications  Medication Sig Dispense Refill    Acetaminophen (TYLENOL PO) Take 500 mg by mouth daily as needed.     amLODipine (NORVASC) 5 MG tablet Take 1 tablet by mouth once daily 90 tablet 2   aspirin EC 81 MG tablet Take by mouth.     Coenzyme Q10 (COQ10) 100 MG CAPS Take 3 capsules by mouth daily. 30 capsule    diazepam (VALIUM) 2 MG tablet Take 2 mg by mouth 3 (three) times daily as needed.     levocetirizine (XYZAL) 5 MG tablet Take 5 mg by mouth every evening.     metoprolol succinate (TOPROL-XL) 25 MG 24 hr tablet TAKE 1/2 (ONE-HALF) TABLET BY MOUTH ONCE DAILY WITH OR IMMEDIATELY FOLLOWING A MEAL 45 tablet 1   nitroGLYCERIN (NITROSTAT) 0.4 MG SL tablet 1 tablet under the tongue may be used every 5 minutes as needed, for up to 15 minutes. 25 tablet 3   omeprazole (PRILOSEC) 10 MG capsule Take 20 mg by mouth as needed.     oxyCODONE (OXY IR/ROXICODONE) 5 MG immediate release tablet Take 1 tablet (5 mg total) by mouth every 6 (six) hours as needed for severe pain. 15 tablet 0   polyethylene glycol (MIRALAX / GLYCOLAX) 17 g packet Take 17 g by mouth daily.     rosuvastatin (CRESTOR) 20 MG tablet Take 1 tablet by mouth once daily 90 tablet 3   temazepam (RESTORIL) 30 MG capsule Take 1 capsule (30 mg total) by mouth at bedtime as needed for sleep. 30 capsule 5   Triamcinolone Acetonide (NASACORT ALLERGY 24HR NA) Place 1 spray into the nose daily.     Probiotic Product (PROBIOTIC ADVANCED PO) Take 1 capsule by mouth daily. (Patient not taking: Reported on 09/11/2022)  No current facility-administered medications for this visit.    PHYSICAL EXAMINATION: ECOG PERFORMANCE STATUS: 1 - Symptomatic but completely ambulatory  Vitals:   10/06/22 1159  BP: 132/72  Pulse: 88  Resp: 20  Temp: 97.7 F (36.5 C)  SpO2: 100%   Filed Weights   10/06/22 1159  Weight: 124 lb 11.2 oz (56.6 kg)     LABORATORY DATA:  I have reviewed the data as listed    Latest Ref Rng & Units 08/27/2022    8:34 AM 07/29/2022   11:41 AM 07/08/2021    3:41  PM  CMP  Glucose 70 - 99 mg/dL 83  440  347   BUN 8 - 23 mg/dL 14  13  10    Creatinine 0.44 - 1.00 mg/dL 4.25  9.56  3.87   Sodium 135 - 145 mmol/L 136  133  136   Potassium 3.5 - 5.1 mmol/L 4.4  4.4  4.6   Chloride 98 - 111 mmol/L 100  97  99   CO2 22 - 32 mmol/L 30  28  27    Calcium 8.9 - 10.3 mg/dL 9.8  9.8  9.4   Total Protein 6.5 - 8.1 g/dL 7.9  7.5  7.5   Total Bilirubin 0.3 - 1.2 mg/dL 0.4  0.4  0.8   Alkaline Phos 38 - 126 U/L 61  58  63   AST 15 - 41 U/L 18  19  92   ALT 0 - 44 U/L 12  13  111     Lab Results  Component Value Date   WBC 7.1 08/27/2022   HGB 15.1 (H) 08/27/2022   HCT 45.2 08/27/2022   MCV 93.8 08/27/2022   PLT 341 08/27/2022   NEUTROABS 4.9 08/27/2022    ASSESSMENT & PLAN:  Malignant neoplasm of upper-outer quadrant of left breast in female, estrogen receptor positive (HCC) 08/18/2022: Screening mammogram detected left breast mass upper central 4 o'clock position: 1.3 cm, axilla negative, biopsy: Grade 2 IDC ER 95% PR 0% Ki67 15%, HER2 3+ positive 09/18/2022: Left lumpectomy: Grade 2 IDC 1.2 cm with DCIS intermediate grade, margins negative, LVI not present, ER 100%, PR 0%, HER2 positive by FISH ratio 3, copy #4.95, Ki-67 10%  Pathology counseling: I discussed the final pathology report of the patient provided  a copy of this report. I discussed the margins as well as lymph node surgeries. We also discussed the final staging along with previously performed ER/PR and HER-2/neu testing.  Treatment plan: Adjuvant Herceptin (patient is not a candidate for chemo): We will check echocardiogram and a bone density test Adjuvant radiation therapy Followed by adjuvant antiestrogen therapy with anastrozole 1 mg daily x 5 years  Return to clinic to start Herceptin injections if the echocardiogram shows good ejection fraction.  She tells me that she had a blockage of the left main a few years ago. She also tells me that she has osteoporosis.  If she has profound  osteoporosis then we will need to use tamoxifen instead.  She tells me that she does not want to take bisphosphonates for fear of dental issues.  Return to clinic in 3 weeks for follow-up to discuss final treatment plan.  Orders Placed This Encounter  Procedures   DG Bone Density    Standing Status:   Future    Standing Expiration Date:   10/06/2023    Order Specific Question:   Reason for Exam (SYMPTOM  OR DIAGNOSIS REQUIRED)    Answer:  postmenopausal    Order Specific Question:   Preferred imaging location?    Answer:   [facility]   ECHOCARDIOGRAM COMPLETE    Standing Status:   Future    Standing Expiration Date:   10/06/2023    Order Specific Question:   Where should this test be performed    Answer:   [facility]    Order Specific Question:   Perflutren DEFINITY (image enhancing agent) should be administered unless hypersensitivity or allergy exist    Answer:   Administer Perflutren    Order Specific Question:   Reason for exam-Echo    Answer:   Chemo  Z09   The patient has a good understanding of the overall plan. she agrees with it. she will call with any problems that may develop before the next visit here. Total time spent: 30 mins including face to face time and time spent for planning, charting and co-ordination of care   Tamsen Meek, MD 10/06/22    I Janan Ridge am acting as a Neurosurgeon for [employer]  I have reviewed the above documentation for accuracy and completeness, and I agree with the above.

## 2022-10-07 ENCOUNTER — Encounter: Payer: Self-pay | Admitting: *Deleted

## 2022-10-16 NOTE — Progress Notes (Signed)
Radiation Oncology         (336) [phone] ________________________________  Name: Michelle Hubbard MRN: [id number]  Date: 10/17/2022  DOB: [birth year]  Follow-Up Visit Note  Outpatient  CC: Corwin Levins, MD  Serena Croissant, MD  Diagnosis:      ICD-10-CM   1. Malignant neoplasm of upper-outer quadrant of left breast in female, estrogen receptor positive (HCC)  C50.412    Z17.0      Stage IA (cT1c, cN0, cM0) Left Breast UOQ, Invasive and in situ ductal carcinoma, ER+ / PR- / Her2+, Grade 2: s/p lumpectomy   CHIEF COMPLAINT: Here to discuss management of left breast cancer  Narrative:  The patient returns today for follow-up.     Since her breast consultation date of 08/27/22, she opted to proceed with a left breast lumpectomy without nodal biopsies on 09/18/22 under the care of Dr. Luisa Hart. 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  In the setting of her Her2 positive status on final pathology, the patient will proceed with adjuvant Herceptin at the recommendation of Dr. Pamelia Hoit. She is not a candidate for chemotherapy. After she completes radiation therapy, she has agreed to proceed with antiestrogen therapy consisting of anastrozole x 5 years.   Symptomatically, the patient reports: healing well         ALLERGIES:  is allergic to codeine, lortab [hydrocodone-acetaminophen], and penicillins.  Meds: Current Outpatient Medications  Medication Sig Dispense Refill   Acetaminophen (TYLENOL PO) Take 500 mg by mouth daily as needed.      amLODipine (NORVASC) 5 MG tablet Take 1 tablet by mouth once daily 90 tablet 2   aspirin EC 81 MG tablet Take by mouth.     Coenzyme Q10 (COQ10) 100 MG CAPS Take 3 capsules by mouth daily. 30 capsule    diazepam (VALIUM) 2 MG tablet Take 2 mg by mouth 3 (three) times daily as needed.     levocetirizine (XYZAL) 5 MG tablet Take 5 mg by mouth every evening.     metoprolol succinate (TOPROL-XL) 25 MG 24 hr tablet TAKE 1/2 (ONE-HALF) TABLET BY MOUTH ONCE DAILY WITH OR IMMEDIATELY FOLLOWING A MEAL 45 tablet 1   nitroGLYCERIN (NITROSTAT) 0.4 MG SL tablet 1 tablet under the tongue may be used every 5 minutes as needed, for up to 15 minutes. 25 tablet 3   omeprazole (PRILOSEC) 10 MG capsule Take 20 mg by mouth as needed.     polyethylene glycol (MIRALAX / GLYCOLAX) 17 g packet Take 17 g by mouth daily.     rosuvastatin (CRESTOR) 20 MG tablet Take 1 tablet by mouth once daily 90 tablet 3   temazepam (RESTORIL) 30 MG capsule Take 1 capsule (30 mg total) by mouth at bedtime as needed for sleep. 30 capsule 5   Triamcinolone Acetonide (NASACORT ALLERGY 24HR NA) Place 1 spray into the nose daily.     oxyCODONE (OXY IR/ROXICODONE) 5 MG immediate release tablet Take 1 tablet (5 mg total) by mouth every 6 (six) hours as needed for severe pain. (Patient not taking: Reported on  Radiation Oncology         (336) [phone] ________________________________  Name: Michelle Hubbard MRN: [id number]  Date: 10/17/2022  DOB: [birth year]  Follow-Up Visit Note  Outpatient  CC: Corwin Levins, MD  Serena Croissant, MD  Diagnosis:      ICD-10-CM   1. Malignant neoplasm of upper-outer quadrant of left breast in female, estrogen receptor positive (HCC)  C50.412    Z17.0      Stage IA (cT1c, cN0, cM0) Left Breast UOQ, Invasive and in situ ductal carcinoma, ER+ / PR- / Her2+, Grade 2: s/p lumpectomy   CHIEF COMPLAINT: Here to discuss management of left breast cancer  Narrative:  The patient returns today for follow-up.     Since her breast consultation date of 08/27/22, she opted to proceed with a left breast lumpectomy without nodal biopsies on 09/18/22 under the care of Dr. Luisa Hart. 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  In the setting of her Her2 positive status on final pathology, the patient will proceed with adjuvant Herceptin at the recommendation of Dr. Pamelia Hoit. She is not a candidate for chemotherapy. After she completes radiation therapy, she has agreed to proceed with antiestrogen therapy consisting of anastrozole x 5 years.   Symptomatically, the patient reports: healing well         ALLERGIES:  is allergic to codeine, lortab [hydrocodone-acetaminophen], and penicillins.  Meds: Current Outpatient Medications  Medication Sig Dispense Refill   Acetaminophen (TYLENOL PO) Take 500 mg by mouth daily as needed.      amLODipine (NORVASC) 5 MG tablet Take 1 tablet by mouth once daily 90 tablet 2   aspirin EC 81 MG tablet Take by mouth.     Coenzyme Q10 (COQ10) 100 MG CAPS Take 3 capsules by mouth daily. 30 capsule    diazepam (VALIUM) 2 MG tablet Take 2 mg by mouth 3 (three) times daily as needed.     levocetirizine (XYZAL) 5 MG tablet Take 5 mg by mouth every evening.     metoprolol succinate (TOPROL-XL) 25 MG 24 hr tablet TAKE 1/2 (ONE-HALF) TABLET BY MOUTH ONCE DAILY WITH OR IMMEDIATELY FOLLOWING A MEAL 45 tablet 1   nitroGLYCERIN (NITROSTAT) 0.4 MG SL tablet 1 tablet under the tongue may be used every 5 minutes as needed, for up to 15 minutes. 25 tablet 3   omeprazole (PRILOSEC) 10 MG capsule Take 20 mg by mouth as needed.     polyethylene glycol (MIRALAX / GLYCOLAX) 17 g packet Take 17 g by mouth daily.     rosuvastatin (CRESTOR) 20 MG tablet Take 1 tablet by mouth once daily 90 tablet 3   temazepam (RESTORIL) 30 MG capsule Take 1 capsule (30 mg total) by mouth at bedtime as needed for sleep. 30 capsule 5   Triamcinolone Acetonide (NASACORT ALLERGY 24HR NA) Place 1 spray into the nose daily.     oxyCODONE (OXY IR/ROXICODONE) 5 MG immediate release tablet Take 1 tablet (5 mg total) by mouth every 6 (six) hours as needed for severe pain. (Patient not taking: Reported on  Radiation Oncology         (336) [phone] ________________________________  Name: Michelle Hubbard MRN: [id number]  Date: 10/17/2022  DOB: [birth year]  Follow-Up Visit Note  Outpatient  CC: Corwin Levins, MD  Serena Croissant, MD  Diagnosis:      ICD-10-CM   1. Malignant neoplasm of upper-outer quadrant of left breast in female, estrogen receptor positive (HCC)  C50.412    Z17.0      Stage IA (cT1c, cN0, cM0) Left Breast UOQ, Invasive and in situ ductal carcinoma, ER+ / PR- / Her2+, Grade 2: s/p lumpectomy   CHIEF COMPLAINT: Here to discuss management of left breast cancer  Narrative:  The patient returns today for follow-up.     Since her breast consultation date of 08/27/22, she opted to proceed with a left breast lumpectomy without nodal biopsies on 09/18/22 under the care of Dr. Luisa Hart. 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  In the setting of her Her2 positive status on final pathology, the patient will proceed with adjuvant Herceptin at the recommendation of Dr. Pamelia Hoit. She is not a candidate for chemotherapy. After she completes radiation therapy, she has agreed to proceed with antiestrogen therapy consisting of anastrozole x 5 years.   Symptomatically, the patient reports: healing well         ALLERGIES:  is allergic to codeine, lortab [hydrocodone-acetaminophen], and penicillins.  Meds: Current Outpatient Medications  Medication Sig Dispense Refill   Acetaminophen (TYLENOL PO) Take 500 mg by mouth daily as needed.      amLODipine (NORVASC) 5 MG tablet Take 1 tablet by mouth once daily 90 tablet 2   aspirin EC 81 MG tablet Take by mouth.     Coenzyme Q10 (COQ10) 100 MG CAPS Take 3 capsules by mouth daily. 30 capsule    diazepam (VALIUM) 2 MG tablet Take 2 mg by mouth 3 (three) times daily as needed.     levocetirizine (XYZAL) 5 MG tablet Take 5 mg by mouth every evening.     metoprolol succinate (TOPROL-XL) 25 MG 24 hr tablet TAKE 1/2 (ONE-HALF) TABLET BY MOUTH ONCE DAILY WITH OR IMMEDIATELY FOLLOWING A MEAL 45 tablet 1   nitroGLYCERIN (NITROSTAT) 0.4 MG SL tablet 1 tablet under the tongue may be used every 5 minutes as needed, for up to 15 minutes. 25 tablet 3   omeprazole (PRILOSEC) 10 MG capsule Take 20 mg by mouth as needed.     polyethylene glycol (MIRALAX / GLYCOLAX) 17 g packet Take 17 g by mouth daily.     rosuvastatin (CRESTOR) 20 MG tablet Take 1 tablet by mouth once daily 90 tablet 3   temazepam (RESTORIL) 30 MG capsule Take 1 capsule (30 mg total) by mouth at bedtime as needed for sleep. 30 capsule 5   Triamcinolone Acetonide (NASACORT ALLERGY 24HR NA) Place 1 spray into the nose daily.     oxyCODONE (OXY IR/ROXICODONE) 5 MG immediate release tablet Take 1 tablet (5 mg total) by mouth every 6 (six) hours as needed for severe pain. (Patient not taking: Reported on  Radiation Oncology         (336) [phone] ________________________________  Name: Michelle Hubbard MRN: [id number]  Date: 10/17/2022  DOB: [birth year]  Follow-Up Visit Note  Outpatient  CC: Corwin Levins, MD  Serena Croissant, MD  Diagnosis:      ICD-10-CM   1. Malignant neoplasm of upper-outer quadrant of left breast in female, estrogen receptor positive (HCC)  C50.412    Z17.0      Stage IA (cT1c, cN0, cM0) Left Breast UOQ, Invasive and in situ ductal carcinoma, ER+ / PR- / Her2+, Grade 2: s/p lumpectomy   CHIEF COMPLAINT: Here to discuss management of left breast cancer  Narrative:  The patient returns today for follow-up.     Since her breast consultation date of 08/27/22, she opted to proceed with a left breast lumpectomy without nodal biopsies on 09/18/22 under the care of Dr. Luisa Hart. 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  In the setting of her Her2 positive status on final pathology, the patient will proceed with adjuvant Herceptin at the recommendation of Dr. Pamelia Hoit. She is not a candidate for chemotherapy. After she completes radiation therapy, she has agreed to proceed with antiestrogen therapy consisting of anastrozole x 5 years.   Symptomatically, the patient reports: healing well         ALLERGIES:  is allergic to codeine, lortab [hydrocodone-acetaminophen], and penicillins.  Meds: Current Outpatient Medications  Medication Sig Dispense Refill   Acetaminophen (TYLENOL PO) Take 500 mg by mouth daily as needed.      amLODipine (NORVASC) 5 MG tablet Take 1 tablet by mouth once daily 90 tablet 2   aspirin EC 81 MG tablet Take by mouth.     Coenzyme Q10 (COQ10) 100 MG CAPS Take 3 capsules by mouth daily. 30 capsule    diazepam (VALIUM) 2 MG tablet Take 2 mg by mouth 3 (three) times daily as needed.     levocetirizine (XYZAL) 5 MG tablet Take 5 mg by mouth every evening.     metoprolol succinate (TOPROL-XL) 25 MG 24 hr tablet TAKE 1/2 (ONE-HALF) TABLET BY MOUTH ONCE DAILY WITH OR IMMEDIATELY FOLLOWING A MEAL 45 tablet 1   nitroGLYCERIN (NITROSTAT) 0.4 MG SL tablet 1 tablet under the tongue may be used every 5 minutes as needed, for up to 15 minutes. 25 tablet 3   omeprazole (PRILOSEC) 10 MG capsule Take 20 mg by mouth as needed.     polyethylene glycol (MIRALAX / GLYCOLAX) 17 g packet Take 17 g by mouth daily.     rosuvastatin (CRESTOR) 20 MG tablet Take 1 tablet by mouth once daily 90 tablet 3   temazepam (RESTORIL) 30 MG capsule Take 1 capsule (30 mg total) by mouth at bedtime as needed for sleep. 30 capsule 5   Triamcinolone Acetonide (NASACORT ALLERGY 24HR NA) Place 1 spray into the nose daily.     oxyCODONE (OXY IR/ROXICODONE) 5 MG immediate release tablet Take 1 tablet (5 mg total) by mouth every 6 (six) hours as needed for severe pain. (Patient not taking: Reported on  10/17/2022) 15 tablet 0   Probiotic Product (PROBIOTIC ADVANCED PO) Take 1 capsule by mouth daily. (Patient not taking: Reported on 09/11/2022)     No current facility-administered medications for this encounter.    Physical Findings:  height is 5\' 5"  (1.651 m) and weight is 123 lb 4 oz (55.9 kg). Her temporal temperature is 97.7 F (36.5 C). Her blood pressure is 130/74 and her pulse is 82. Her respiration is 18 and oxygen saturation is 93%. .     General: Alert and oriented, in no acute distress HEENT: Head is normocephalic. Extraocular movements are intact. Extremities: No cyanosis or edema. Good range of motion, Lt  shoulder Lymphatics: see Neck Exam Musculoskeletal: symmetric strength and muscle tone throughout. Neurologic: No obvious focalities. Speech is fluent.  Psychiatric: Judgment and insight are intact. Affect is appropriate. Breast exam reveals satisfactory healing, central left breast scar.  Lab Findings: Lab Results  Component Value Date   WBC 7.1 08/27/2022   HGB 15.1 (H) 08/27/2022   HCT 45.2 08/27/2022   MCV 93.8 08/27/2022   PLT 341 08/27/2022    @LASTCHEMISTRY @  Radiographic Findings: ECHOCARDIOGRAM COMPLETE  Result Date: 10/17/2022    ECHOCARDIOGRAM REPORT   Patient Name:   Michelle Hubbard [facility] Date of Exam: 10/17/2022 Medical Rec #:  [id number]        Height:       65.0 in Accession #:    [id number]       Weight:       124.7 lb Date of Birth:  1945/09/14       BSA:          1.618 m Patient Age:    77 years         BP:           132/72 mmHg Patient Gender: F                HR:           73 bpm. Exam Location:  Outpatient Procedure: 2D Echo, Color Doppler, Cardiac Doppler and Strain Analysis Indications:    Z51.11 Encounter for antineoplastic chemotheraphy  History:        Patient has no prior history of Echocardiogram examinations.                 CAD, Breast Cancer and COPD; Risk Factors:Current Smoker,                 Dyslipidemia and Hypertension.  Sonographer:    Milbert Coulter Referring Phys: [id number] Serena Croissant  Sonographer Comments: Image acquisition challenging due to COPD. Global longitudinal strain was attempted. IMPRESSIONS  1. Left ventricular ejection fraction, by estimation, is 65 to 70%. The left ventricle has normal function. The left ventricle has no regional wall motion abnormalities. Left ventricular diastolic parameters are consistent with Grade I diastolic dysfunction (impaired relaxation).  2. Right ventricular systolic function is normal. The right ventricular size is normal. There is normal pulmonary artery systolic pressure. The estimated right ventricular systolic  pressure is 33.7 mmHg.  3. The mitral valve is grossly normal. Trivial mitral valve regurgitation. No evidence of mitral stenosis.  4. The aortic valve is tricuspid. There is mild calcification of the aortic valve. There is mild thickening of the aortic valve. Aortic valve regurgitation is not visualized. Aortic valve sclerosis is present, with no evidence of aortic valve stenosis.  5. The inferior vena cava is normal in size with greater than 50% respiratory variability, suggesting right atrial

## 2022-10-17 ENCOUNTER — Ambulatory Visit (HOSPITAL_BASED_OUTPATIENT_CLINIC_OR_DEPARTMENT_OTHER)
Admission: RE | Admit: 2022-10-17 | Discharge: 2022-10-17 | Disposition: A | Discharge status: Home / Self Care | Payer: Medicare PPO | Source: Ambulatory Visit | Attending: Hematology and Oncology | Admitting: Hematology and Oncology

## 2022-10-17 ENCOUNTER — Encounter: Payer: Self-pay | Admitting: Radiation Oncology

## 2022-10-17 ENCOUNTER — Ambulatory Visit
Admission: RE | Admit: 2022-10-17 | Discharge: 2022-10-17 | Disposition: A | Discharge status: Home / Self Care | Payer: Medicare PPO | Source: Ambulatory Visit | Attending: Radiation Oncology | Admitting: Radiation Oncology

## 2022-10-17 VITALS — BP 130/74 | HR 82 | Temp 97.7°F | Resp 18 | Ht 65.0 in | Wt 123.2 lb

## 2022-10-17 DIAGNOSIS — Z170 Estrogen receptor positive status [ER+]: Secondary | ICD-10-CM | POA: Diagnosis not present

## 2022-10-17 DIAGNOSIS — F17200 Nicotine dependence, unspecified, uncomplicated: Secondary | ICD-10-CM | POA: Diagnosis not present

## 2022-10-17 DIAGNOSIS — I427 Cardiomyopathy due to drug and external agent: Secondary | ICD-10-CM | POA: Insufficient documentation

## 2022-10-17 DIAGNOSIS — C50412 Malignant neoplasm of upper-outer quadrant of left female breast: Secondary | ICD-10-CM | POA: Diagnosis not present

## 2022-10-17 DIAGNOSIS — Z780 Asymptomatic menopausal state: Secondary | ICD-10-CM

## 2022-10-17 DIAGNOSIS — I2510 Atherosclerotic heart disease of native coronary artery without angina pectoris: Secondary | ICD-10-CM | POA: Insufficient documentation

## 2022-10-17 DIAGNOSIS — J449 Chronic obstructive pulmonary disease, unspecified: Secondary | ICD-10-CM | POA: Insufficient documentation

## 2022-10-17 DIAGNOSIS — E785 Hyperlipidemia, unspecified: Secondary | ICD-10-CM | POA: Diagnosis not present

## 2022-10-17 DIAGNOSIS — Z79899 Other long term (current) drug therapy: Secondary | ICD-10-CM | POA: Diagnosis not present

## 2022-10-17 DIAGNOSIS — I10 Essential (primary) hypertension: Secondary | ICD-10-CM | POA: Insufficient documentation

## 2022-10-17 DIAGNOSIS — D0512 Intraductal carcinoma in situ of left breast: Secondary | ICD-10-CM

## 2022-10-17 LAB — ECHOCARDIOGRAM COMPLETE
AR max vel: 1.77 cm2
AV Area VTI: 1.69 cm2
AV Area mean vel: 1.78 cm2
AV Mean grad: 3 mmHg
AV Peak grad: 6 mmHg
Ao pk vel: 1.22 m/s
Area-P 1/2: 2.99 cm2
S' Lateral: 2.7 cm

## 2022-10-20 ENCOUNTER — Ambulatory Visit: Payer: Medicare PPO | Admitting: Neurology

## 2022-10-20 ENCOUNTER — Telehealth: Payer: Self-pay | Admitting: Radiation Oncology

## 2022-10-20 ENCOUNTER — Encounter: Payer: Self-pay | Admitting: Neurology

## 2022-10-20 ENCOUNTER — Ambulatory Visit (HOSPITAL_BASED_OUTPATIENT_CLINIC_OR_DEPARTMENT_OTHER)
Admission: RE | Admit: 2022-10-20 | Discharge: 2022-10-20 | Disposition: A | Discharge status: Home / Self Care | Payer: Medicare PPO | Source: Ambulatory Visit | Attending: Hematology and Oncology | Admitting: Hematology and Oncology

## 2022-10-20 VITALS — BP 114/57 | HR 82 | Ht 65.0 in | Wt 123.8 lb

## 2022-10-20 DIAGNOSIS — C50412 Malignant neoplasm of upper-outer quadrant of left female breast: Secondary | ICD-10-CM | POA: Diagnosis not present

## 2022-10-20 DIAGNOSIS — R202 Paresthesia of skin: Secondary | ICD-10-CM

## 2022-10-20 DIAGNOSIS — M810 Age-related osteoporosis without current pathological fracture: Secondary | ICD-10-CM | POA: Diagnosis not present

## 2022-10-20 DIAGNOSIS — Z170 Estrogen receptor positive status [ER+]: Secondary | ICD-10-CM | POA: Diagnosis not present

## 2022-10-20 DIAGNOSIS — Z780 Asymptomatic menopausal state: Secondary | ICD-10-CM | POA: Diagnosis not present

## 2022-10-20 DIAGNOSIS — R292 Abnormal reflex: Secondary | ICD-10-CM

## 2022-10-20 NOTE — Telephone Encounter (Signed)
9/23 Received call from patient to be schedule for CT Sim/Treatment planning.  Called and spoke to [place] (Support RTT), forward call as requested.

## 2022-10-20 NOTE — Patient Instructions (Signed)
MRI lumbar spine wwo contrast in [place].  Please request the imaging center to put the images on a disc, so you can mail it to me.

## 2022-10-20 NOTE — Progress Notes (Signed)
[facility] Clinic Note - Initial Visit   Date: 10/20/2022   Michelle Hubbard MRN: [id number] DOB: 09/07/1945   Dear Dr. Jonny Ruiz:  Thank you for your kind referral of Michelle Hubbard for consultation of paresthesias involving the right foot. Although her history is well known to you, please allow [place] to reiterate it for the purpose of our medical record. The patient was accompanied to the clinic by sister who also provides collateral information.     Michelle Hubbard is a 77 y.o. right-handed female with hyperlipidemia, COPD, depression, hypertension, GERD, and recently diagnosed left breast cancer presenting for evaluation of right foot numbness.   IMPRESSION/PLAN: Right leg and foot paresthesias.  Exam shows brisk reflexes in the legs with diminished temperature at the right toes.  She may have some lumbar canal stenosis which could result in compressive pathology causing her right leg symptoms.  I will check MRI lumbar spine wwo contrast to be performed in [place].  Contrasted imaging will be ordered given her newly diagnosed breast cancer.  Patient informed to request images on CD to be mail for my review  Further recommendations pending results.  ------------------------------------------------------------- History of present illness: Starting around 2020, she began having numbness of the 2nd, 3rd and 4th toes on the right.  Her symptoms were constant for many years, but starting earlier this year, she began noticing numbness extending into the lower leg.  She denies similar symptoms in the left foot or leg.  She denies weakness.  She has chronic low back pain, but denies radicular pain.  She also has noticed that the right foot will cramp at times.  She has leg heaviness and fatigue with prolonged walking which improves with rest.   She smokes PPD x 50 years.  She drinks 2 mixed drinks daily for many years.  She lives alone in a one level home.     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   COPD 01/08/2010   DEPRESSIVE DISORDER 12/23/2006   Husband passed   FATIGUE 01/08/2010   HYPERLIPIDEMIA 01/08/2010   Hypertension    LOW BACK PAIN 12/23/2006   OAB (overactive bladder) 10/07/2011   OSTEOPOROSIS 01/08/2010   Other specified forms of hearing loss 01/08/2010   PONV (postoperative nausea and vomiting)    SINUSITIS- ACUTE-NOS 07/09/2007   Urticaria    Vitamin D deficiency 04/30/2010    Past Surgical History:  Procedure Laterality Date   ABDOMINAL HYSTERECTOMY     APPENDECTOMY  1988   BREAST BIOPSY  1986   BREAST BIOPSY Left 08/18/2022   [place] LT BREAST BX W LOC DEV 1ST LESION IMG BX SPEC US GUIDE 08/18/2022 [facility]   BREAST BIOPSY  09/17/2022   MM LT RADIOACTIVE SEED LOC MAMMO GUIDE 09/17/2022 [facility]   BREAST LUMPECTOMY WITH RADIOACTIVE SEED LOCALIZATION Left 09/18/2022   Procedure: LEFT BREAST SEED LUMPECTOMY;  Surgeon: Harriette Bouillon, MD;  Location: [facility];  Service: General;  Laterality: Left;   COLON RESECTION N/A 06/03/2013   Procedure: LAPAROSCOPY DIAGNOSTIC and  OPEN  SIGMOID COLON RESECTION ;  Surgeon: Axel Filler, MD;  Location: [facility];  Service: General;  Laterality: N/A;   COLON SURGERY  2000   colon obstruction   larygenscopy     OOPHORECTOMY  bilat 1988   secondary to cyst     Medications:  Outpatient Encounter Medications as of 10/20/2022  Medication Sig   Acetaminophen (TYLENOL PO) Take 500 mg by mouth daily as needed.   amLODipine (NORVASC) 5 MG tablet  Take 1 tablet by mouth once daily   aspirin EC 81 MG tablet Take by mouth.   Coenzyme Q10 (COQ10) 100 MG CAPS Take 3 capsules by mouth daily.   levocetirizine (XYZAL) 5 MG tablet Take 5 mg by mouth every evening.   metoprolol succinate (TOPROL-XL) 25 MG 24 hr tablet TAKE 1/2 (ONE-HALF) TABLET BY MOUTH ONCE DAILY WITH OR IMMEDIATELY FOLLOWING A MEAL   nitroGLYCERIN (NITROSTAT) 0.4 MG SL tablet 1 tablet under the tongue may be used every 5 minutes as needed, for up to 15 minutes.   omeprazole (PRILOSEC) 10 MG capsule Take 20 mg by mouth as needed.   polyethylene glycol (MIRALAX / GLYCOLAX) 17 g packet Take 17 g by mouth daily.   rosuvastatin (CRESTOR) 20 MG tablet Take 1 tablet by mouth once daily   temazepam (RESTORIL) 30 MG capsule Take 1 capsule (30 mg total) by mouth at bedtime as needed for sleep. (Patient taking differently: Take 30 mg by mouth at bedtime.)   Triamcinolone Acetonide (NASACORT ALLERGY 24HR NA) Place 1 spray into the nose daily.   diazepam (VALIUM) 2 MG tablet Take 2 mg by mouth 3 (three) times daily as needed. (Patient not taking: Reported on 10/20/2022)   [DISCONTINUED] oxyCODONE (OXY IR/ROXICODONE) 5 MG immediate release tablet Take 1 tablet (5 mg total) by mouth every 6 (six) hours as needed for severe pain. (Patient not taking: Reported on 10/17/2022)   [DISCONTINUED] Probiotic Product (PROBIOTIC ADVANCED PO) Take 1 capsule by mouth daily. (Patient not taking: Reported on 09/11/2022)   No facility-administered encounter medications on file as of 10/20/2022.    Allergies:  Allergies  Allergen Reactions   Codeine     REACTION: nausea   Lortab [Hydrocodone-Acetaminophen] Hives   Penicillins     Family History: Family History  Problem Relation Age of Onset   Colon cancer Mother    Migraines Father    Colon cancer Brother    Diabetes Maternal Aunt    Diabetes Maternal Grandmother    Allergic rhinitis Neg Hx    Angioedema Neg Hx    Asthma Neg Hx    Eczema Neg Hx     Immunodeficiency Neg Hx    Urticaria Neg Hx     Social History: Social History   Tobacco Use   Smoking status: Every Day    Current packs/day: 1.00    Average packs/day: 1 pack/day for 44.0 years (44.0 ttl pk-yrs)    Types: Cigarettes   Smokeless tobacco: Never   Tobacco comments:    smoking more due to move/ trying to quit  Vaping Use   Vaping status: Never Used  Substance Use Topics   Alcohol use: Yes    Alcohol/week: 7.0 standard drinks of alcohol    Types: 7 Shots of liquor per week    Comment: vodka nightly   Drug use: No   Social History   Social History Narrative   Are you right handed or left handed? Right   Are you currently employed ? N  What is your current occupation?   Do you live at home alone? Y   Who lives with you?    What type of home do you live in: 1 story or 2 story? 1        Vital Signs:  There were no vitals taken for this visit.   Neurological Exam: MENTAL STATUS including orientation to time, place, person, recent and remote memory, attention span and concentration, language, and fund of knowledge is normal.  Speech is not dysarthric.  CRANIAL NERVES: II:  No visual field defects.     III-IV-VI: Pupils equal round and reactive to light.  Normal conjugate, extra-ocular eye movements in all directions of gaze.  No nystagmus.  No ptosis.   V:  Normal facial sensation.    VII:  Normal facial symmetry and movements.   VIII:  Normal hearing and vestibular function.   IX-X:  Normal palatal movement.   XI:  Normal shoulder shrug and head rotation.   XII:  Normal tongue strength and range of motion, no deviation or fasciculation.  MOTOR:  No atrophy, fasciculations or abnormal movements.  No pronator drift.   Upper Extremity:  Right  Left  Deltoid  5/5   5/5   Biceps  5/5   5/5   Triceps  5/5   5/5   Wrist extensors  5/5   5/5   Wrist flexors  5/5   5/5   Finger extensors  5/5   5/5   Finger flexors  5/5   5/5   Dorsal interossei   5/5   5/5   Abductor pollicis  5/5   5/5   Tone (Ashworth scale)  0  0   Lower Extremity:  Right  Left  Hip flexors  5/5   5/5   Hip extensors  5/5   5/5   Adductor 5/5  5/5  Abductor 5/5  5/5  Knee flexors  5/5   5/5   Knee extensors  5/5   5/5   Dorsiflexors  5/5   5/5   Plantarflexors  5/5   5/5   Toe extensors  5/5   5/5   Toe flexors  5/5   5/5   Tone (Ashworth scale)  0  0   MSRs:                                           Right        Left brachioradialis 2+  2+  biceps 2+  2+  triceps 2+  2+  patellar 3+  3+  ankle jerk 2+  2+  Hoffman no  no  plantar response down  down   SENSORY:  Reduced temperature over the right 2nd-4th digits.  Vibration, pin prick, and light touch intact throughout.  COORDINATION/GAIT: Normal finger-to- nose-finger.  Intact rapid alternating movements bilaterally.   Gait mildly wide-based and stable, unassisted.    Thank you for allowing me to participate in patient's care.  If I can answer any additional questions, I would be pleased to do so.    Sincerely,    Neo Yepiz K. Allena Katz, DO

## 2022-10-20 NOTE — Addendum Note (Signed)
Addended by: Leida Lauth on: 10/20/2022 02:07 PM   Modules accepted: Orders

## 2022-10-21 ENCOUNTER — Telehealth: Payer: Self-pay

## 2022-10-21 NOTE — Telephone Encounter (Signed)
MR Lumbar Spine W/WO Contrast

## 2022-10-22 NOTE — Telephone Encounter (Signed)
Called patient and informed her that her MRI has been scheduled for 10/30/22 arrival time at 12:15 pm and MRI at 12:30 pm at [facility].   Patient verbalized understanding and had no further questions or concerns.

## 2022-10-28 ENCOUNTER — Ambulatory Visit
Admission: RE | Admit: 2022-10-28 | Discharge: 2022-10-28 | Disposition: A | Discharge status: Home / Self Care | Payer: Medicare PPO | Source: Ambulatory Visit | Attending: Radiation Oncology | Admitting: Radiation Oncology

## 2022-10-28 ENCOUNTER — Other Ambulatory Visit: Payer: Self-pay

## 2022-10-28 DIAGNOSIS — Z170 Estrogen receptor positive status [ER+]: Secondary | ICD-10-CM | POA: Insufficient documentation

## 2022-10-28 DIAGNOSIS — C50412 Malignant neoplasm of upper-outer quadrant of left female breast: Secondary | ICD-10-CM | POA: Insufficient documentation

## 2022-10-28 DIAGNOSIS — Z510 Encounter for antineoplastic radiation therapy: Secondary | ICD-10-CM | POA: Diagnosis not present

## 2022-10-30 DIAGNOSIS — R202 Paresthesia of skin: Secondary | ICD-10-CM | POA: Diagnosis not present

## 2022-10-30 DIAGNOSIS — C50412 Malignant neoplasm of upper-outer quadrant of left female breast: Secondary | ICD-10-CM | POA: Diagnosis not present

## 2022-10-30 DIAGNOSIS — R922 Inconclusive mammogram: Secondary | ICD-10-CM | POA: Diagnosis not present

## 2022-10-31 ENCOUNTER — Inpatient Hospital Stay: Payer: Medicare PPO | Attending: Licensed Clinical Social Worker | Admitting: Hematology and Oncology

## 2022-10-31 ENCOUNTER — Encounter: Payer: Self-pay | Admitting: Hematology and Oncology

## 2022-10-31 ENCOUNTER — Telehealth: Payer: Self-pay | Admitting: Neurology

## 2022-10-31 VITALS — BP 130/67 | HR 84 | Temp 97.2°F | Resp 18 | Ht 65.0 in | Wt 123.8 lb

## 2022-10-31 DIAGNOSIS — Z5112 Encounter for antineoplastic immunotherapy: Secondary | ICD-10-CM | POA: Insufficient documentation

## 2022-10-31 DIAGNOSIS — M810 Age-related osteoporosis without current pathological fracture: Secondary | ICD-10-CM | POA: Diagnosis not present

## 2022-10-31 DIAGNOSIS — C50412 Malignant neoplasm of upper-outer quadrant of left female breast: Secondary | ICD-10-CM

## 2022-10-31 DIAGNOSIS — Z170 Estrogen receptor positive status [ER+]: Secondary | ICD-10-CM

## 2022-10-31 MED ORDER — TAMOXIFEN CITRATE 20 MG PO TABS: 20.0000 mg | ORAL_TABLET | Freq: Every day | ORAL | 3 refills | Status: DC

## 2022-10-31 NOTE — Telephone Encounter (Signed)
Please let pt know that results from [facility] was received and shows age-related changes and arthritis with some narrowing in the lumbar spine, which could be causing her leg symptoms.  I suggest PT for low back strengthening (will need to order in [place]).  Follow-up if symptoms get worse or do not improve and we can consider nerve testing as the next step.

## 2022-10-31 NOTE — Progress Notes (Signed)
START OFF PATHWAY REGIMEN - Breast   [id number]:Trastuzumab and hyaluronidase-oysk 600 mg/10,000 units SUBQ D1 q21 Days:   A cycle is every 21 days:     Trastuzumab and hyaluronidase-oysk   **Always confirm dose/schedule in your pharmacy ordering system**  Patient Characteristics: Postoperative without Neoadjuvant Therapy, M0 (Pathologic Staging), Invasive Disease, Adjuvant Therapy, HER2 Positive, ER Positive, Node Negative, pT1c, pN0/N53mi Therapeutic Status: Postoperative without Neoadjuvant Therapy, M0 (Pathologic Staging) AJCC Grade: G2 AJCC N Category: pN0 AJCC M Category: cM0 ER Status: Positive (+) AJCC 8 Stage Grouping: IA HER2 Status: Positive (+) Oncotype Dx Recurrence Score: Not Appropriate AJCC T Category: pT1c PR Status: Positive (+) Intent of Therapy: Curative Intent, Discussed with Patient

## 2022-10-31 NOTE — Telephone Encounter (Signed)
Received CD from Dr. Allena Katz box and placed on her desk.

## 2022-10-31 NOTE — Progress Notes (Signed)
Patient Care Team: Corwin Levins, MD as PCP - General Croitoru, Rachelle Hora, MD as PCP - Cardiology (Cardiology) Pershing Proud, RN as Oncology Nurse Navigator Rogelia Boga, Eileen Stanford, RN as Oncology Nurse Navigator Serena Croissant, MD as Consulting Physician (Hematology and Oncology) Harriette Bouillon, MD as Consulting Physician (General Surgery) Lonie Peak, MD as Attending Physician (Radiation Oncology) Yvonne Kendall, MD as Referring Physician (Ophthalmology) Glendale Chard, DO as Consulting Physician (Neurology)  DIAGNOSIS:  Encounter Diagnosis  Name Primary?   Malignant neoplasm of upper-outer quadrant of left breast in female, estrogen receptor positive (HCC) Yes    SUMMARY OF ONCOLOGIC HISTORY: Oncology History  Malignant neoplasm of upper-outer quadrant of left breast in female, estrogen receptor positive (HCC)  08/18/2022 Initial Diagnosis   Screening mammogram detected left breast mass upper central 4 o'clock position: 1.3 cm, axilla negative, biopsy: Grade 2 IDC ER 95% PR 0% Ki67 15%, HER2 3+ positive   08/27/2022 Cancer Staging   Staging form: Breast, AJCC 8th Edition - Clinical stage from 08/27/2022: Stage IA (cT1c, cN0, cM0, G2, ER+, PR-, HER2+) - Signed by Serena Croissant, MD on 08/27/2022 Stage prefix: Initial diagnosis Histologic grading system: 3 grade system Laterality: Left Staged by: Pathologist and managing physician Stage used in treatment planning: Yes National guidelines used in treatment planning: Yes Type of national guideline used in treatment planning: NCCN   11/07/2022 -  Chemotherapy   Patient is on Treatment Plan : BREAST MAINTENANCE Trastuzumab IV (6) or SQ (600) D1 q21d x 13 cycles       CHIEF COMPLIANT: Follow-up to discuss results of echocardiogram, bone density and treatment plan  History of Present Illness   The patient, with a history of heart disease, recently underwent an echocardiogram and a bone density test. The echocardiogram showed an  excellent ejection fraction of 65-70%, which is within the normal range. The patient was initially concerned about the results, but the doctor reassured her that her heart function is excellent.  The bone density test, however, revealed osteoporosis with a T score of -2.8. The patient has never taken any medication for bone density due to concerns about dental problems.   She is  due to start radiation therapy on October 14th. The patient expressed concerns about the side effects of tamoxifen, including hot flashes, mood swings, and muscle cramps.  She is also on cholesterol medication. The patient expressed a desire to resume getting gel nails, which the doctor approved.       ALLERGIES:  is allergic to codeine, lortab [hydrocodone-acetaminophen], and penicillins.  MEDICATIONS:  Current Outpatient Medications  Medication Sig Dispense Refill   Acetaminophen (TYLENOL PO) Take 500 mg by mouth daily as needed.     amLODipine (NORVASC) 5 MG tablet Take 1 tablet by mouth once daily 90 tablet 2   aspirin EC 81 MG tablet Take by mouth.     Coenzyme Q10 (COQ10) 100 MG CAPS Take 3 capsules by mouth daily. 30 capsule    diazepam (VALIUM) 2 MG tablet Take 2 mg by mouth 3 (three) times daily as needed.     levocetirizine (XYZAL) 5 MG tablet Take 5 mg by mouth every evening.     metoprolol succinate (TOPROL-XL) 25 MG 24 hr tablet TAKE 1/2 (ONE-HALF) TABLET BY MOUTH ONCE DAILY WITH OR IMMEDIATELY FOLLOWING A MEAL 45 tablet 1   nitroGLYCERIN (NITROSTAT) 0.4 MG SL tablet 1 tablet under the tongue may be used every 5 minutes as needed, for up to 15 minutes. 25  tablet 3   polyethylene glycol (MIRALAX / GLYCOLAX) 17 g packet Take 17 g by mouth daily.     rosuvastatin (CRESTOR) 20 MG tablet Take 1 tablet by mouth once daily 90 tablet 3   tamoxifen (NOLVADEX) 20 MG tablet Take 1 tablet (20 mg total) by mouth daily. 90 tablet 3   temazepam (RESTORIL) 30 MG capsule Take 1 capsule (30 mg total) by mouth at bedtime  as needed for sleep. (Patient taking differently: Take 30 mg by mouth at bedtime.) 30 capsule 5   Triamcinolone Acetonide (NASACORT ALLERGY 24HR NA) Place 1 spray into the nose daily.     No current facility-administered medications for this visit.    PHYSICAL EXAMINATION: ECOG PERFORMANCE STATUS: 1 - Symptomatic but completely ambulatory  Vitals:   10/31/22 0900  BP: 130/67  Pulse: 84  Resp: 18  Temp: (!) 97.2 F (36.2 C)  SpO2: 95%   Filed Weights   10/31/22 0900  Weight: 123 lb 12.8 oz (56.2 kg)     LABORATORY DATA:  I have reviewed the data as listed    Latest Ref Rng & Units 08/27/2022    8:34 AM 07/29/2022   11:41 AM 07/08/2021    3:41 PM  CMP  Glucose 70 - 99 mg/dL 83  782  956   BUN 8 - 23 mg/dL 14  13  10    Creatinine 0.44 - 1.00 mg/dL 2.13  0.86  5.78   Sodium 135 - 145 mmol/L 136  133  136   Potassium 3.5 - 5.1 mmol/L 4.4  4.4  4.6   Chloride 98 - 111 mmol/L 100  97  99   CO2 22 - 32 mmol/L 30  28  27    Calcium 8.9 - 10.3 mg/dL 9.8  9.8  9.4   Total Protein 6.5 - 8.1 g/dL 7.9  7.5  7.5   Total Bilirubin 0.3 - 1.2 mg/dL 0.4  0.4  0.8   Alkaline Phos 38 - 126 U/L 61  58  63   AST 15 - 41 U/L 18  19  92   ALT 0 - 44 U/L 12  13  111     Lab Results  Component Value Date   WBC 7.1 08/27/2022   HGB 15.1 (H) 08/27/2022   HCT 45.2 08/27/2022   MCV 93.8 08/27/2022   PLT 341 08/27/2022   NEUTROABS 4.9 08/27/2022    ASSESSMENT & PLAN:  Malignant neoplasm of upper-outer quadrant of left breast in female, estrogen receptor positive (HCC) 08/18/2022: Screening mammogram detected left breast mass upper central 4 o'clock position: 1.3 cm, axilla negative, biopsy: Grade 2 IDC ER 95% PR 0% Ki67 15%, HER2 3+ positive 09/18/2022: Left lumpectomy: Grade 2 IDC 1.2 cm with DCIS intermediate grade, margins negative, LVI not present, ER 100%, PR 0%, HER2 positive by FISH ratio 3, copy #4.95, Ki-67 10%  Treatment plan: Adjuvant Herceptin (patient is not a candidate for  chemo): We will check echocardiogram and a bone density test Adjuvant radiation therapy to start 11/10/2022 Followed by adjuvant antiestrogen therapy with tamoxifen 20 mg x 5 years (due to osteoporosis and inability to take bisphosphonates) ----------------------------------------------------------------------------------------------------------------------------------------------- 10/22/2022: Echo: EF 65 to 70% Bone density 10/20/2022: T-score -2.8: Osteoporosis: Recommended calcium vitamin D and bisphosphonate therapy   Return to clinic every 3 weeks for Herceptin and every 6 weeks of follow-up with [place]  Osteoporosis T-score of -2.8 indicating osteoporosis. The patient has not taken any medications for bone density due to dental issues. -  Plan to improve bone density with Tamoxifen as part of breast cancer treatment.  Herceptin Therapy Echocardiogram showed excellent heart function (EF 65-70%), allowing for safe administration of Herceptin. -Start Herceptin injections every three weeks, starting next Friday.  Radiation Therapy Scheduled to start on October 14th. -Proceed with radiation therapy as planned.  General Health Maintenance / Followup Plans -Return for follow-up visits every other Herceptin injection. -Check-in after completion of radiation therapy to start Tamoxifen.          No orders of the defined types were placed in this encounter.  The patient has a good understanding of the overall plan. she agrees with it. she will call with any problems that may develop before the next visit here. Total time spent: 30 mins including face to face time and time spent for planning, charting and co-ordination of care   Tamsen Meek, MD 10/31/22

## 2022-10-31 NOTE — Telephone Encounter (Signed)
Called patient and left a message for a call back

## 2022-10-31 NOTE — Assessment & Plan Note (Signed)
08/18/2022: Screening mammogram detected left breast mass upper central 4 o'clock position: 1.3 cm, axilla negative, biopsy: Grade 2 IDC ER 95% PR 0% Ki67 15%, HER2 3+ positive 09/18/2022: Left lumpectomy: Grade 2 IDC 1.2 cm with DCIS intermediate grade, margins negative, LVI not present, ER 100%, PR 0%, HER2 positive by FISH ratio 3, copy #4.95, Ki-67 10%  Treatment plan: Adjuvant Herceptin (patient is not a candidate for chemo): We will check echocardiogram and a bone density test Adjuvant radiation therapy Followed by adjuvant antiestrogen therapy with anastrozole 1 mg daily x 5 years versus tamoxifen due to osteoporosis ----------------------------------------------------------------------------------------------------------------------------------------------- 10/22/2022: Echo: EF 65 to 70% Bone density 10/20/2022: T-score -2.8: Osteoporosis: Recommended calcium vitamin D and bisphosphonate therapy Current treatment: Adjuvant Herceptin Return to clinic every 3 weeks for Herceptin and every 6 weeks of follow-up with [place]

## 2022-10-31 NOTE — Telephone Encounter (Signed)
Pt came in to drop of CD from [facility]. CD is in Dr Eliane Decree box

## 2022-11-01 ENCOUNTER — Other Ambulatory Visit: Payer: Self-pay

## 2022-11-02 ENCOUNTER — Encounter: Payer: Self-pay | Admitting: Hematology and Oncology

## 2022-11-03 ENCOUNTER — Encounter: Payer: Self-pay | Admitting: Radiation Oncology

## 2022-11-03 ENCOUNTER — Telehealth: Payer: Self-pay | Admitting: Neurology

## 2022-11-03 ENCOUNTER — Encounter: Payer: Self-pay | Admitting: *Deleted

## 2022-11-03 ENCOUNTER — Telehealth: Payer: Self-pay

## 2022-11-03 NOTE — Telephone Encounter (Signed)
See other encounter.

## 2022-11-03 NOTE — Telephone Encounter (Signed)
Called patient and left a message for a call back.  

## 2022-11-03 NOTE — Telephone Encounter (Signed)
RN called pt after she left a my chart message about confusion with the an appointment with Dr. Basilio Cairo. She called with concerns that her original print out stated she had an appointment on 10-30 with Dr. Basilio Cairo. RN was unable to find this appointment at this time. Pt was grateful for the phone call to clear up the confusion. RN encouraged pt to keep her appointment with cardiology on 10-30 that was listed.

## 2022-11-03 NOTE — Telephone Encounter (Signed)
Pt called in and left a message with the access nurse. She is returning our call

## 2022-11-04 ENCOUNTER — Inpatient Hospital Stay: Payer: Medicare PPO

## 2022-11-04 ENCOUNTER — Inpatient Hospital Stay: Payer: Medicare PPO | Admitting: Pharmacist

## 2022-11-04 DIAGNOSIS — Z5112 Encounter for antineoplastic immunotherapy: Secondary | ICD-10-CM | POA: Diagnosis not present

## 2022-11-04 DIAGNOSIS — M810 Age-related osteoporosis without current pathological fracture: Secondary | ICD-10-CM | POA: Diagnosis not present

## 2022-11-04 DIAGNOSIS — Z170 Estrogen receptor positive status [ER+]: Secondary | ICD-10-CM

## 2022-11-04 DIAGNOSIS — C50412 Malignant neoplasm of upper-outer quadrant of left female breast: Secondary | ICD-10-CM | POA: Diagnosis not present

## 2022-11-04 MED ORDER — ONDANSETRON HCL 8 MG PO TABS: 8.0000 mg | ORAL_TABLET | Freq: Three times a day (TID) | ORAL | 2 refills | Status: DC | PRN

## 2022-11-04 NOTE — Progress Notes (Signed)
[facility]       Telephone: [phone]?Fax: [phone]   Oncology Clinical Pharmacist Practitioner Initial Assessment  Michelle Hubbard is a 77 y.o. female with a diagnosis of breast cancer. They were contacted today via in-person visit. Today she presents to clinic alone.   We discussed Michelle Hubbard's upcoming treatment and management of trastuzumab therapy in detail. She was unaware of any plan for port placement and would prefer to receive trastuzumab through a peripheral IV. She reports having mild nausea and vertigo at baseline for which we are able to send in ondansetron to have on hand to be used as needed. All her questions were answered. She is aware she can reach out to the clinic should she have any additional questions regarding her treatment that she may have.   Indication/Regimen Trastuzumab (Herceptin) is being used appropriately for treatment of breast cancer by Dr. Serena Croissant.      Wt Readings from Last 1 Encounters:  10/31/22 56.2 kg (123 lb 12.8 oz)    Estimated body surface area is 1.61 meters squared as calculated from the following:   Height as of 10/31/22: 5\' 5"  (1.651 m).   Weight as of 10/31/22: 56.2 kg (123 lb 12.8 oz).  The dosing regimen is every 21 days for 13 cycles  Trastuzumab (8 mg/kg loading dose, 6 mg/kg maintenance dose) IV on Day 1  Tentative treatment start date 11/07/22 with radiation starting on 12/11/22. Plan to start tamoxifen after completion of radiation.   Dose Modifications Per Dr. Pamelia Hoit, OK to add 8 mg/kg loading dose of trastuzumab to treatment for cycle 1. Treatment plan adjusted accordingly.    Allergies Allergies  Allergen Reactions   Codeine     REACTION: nausea   Lortab [Hydrocodone-Acetaminophen] Hives   Penicillins     Vitals    10/31/2022    9:00 AM 10/20/2022    1:26 PM 10/17/2022   12:31 PM  Oncology Vitals  Height 165 cm 165 cm 165 cm  Weight 56.155 kg 56.155 kg 55.906 kg  Weight (lbs)  123 lbs 13 oz 123 lbs 13 oz 123 lbs 4 oz  BMI 20.6 kg/m2 20.6 kg/m2 20.51 kg/m2  Temp 97.2 F (36.2 C)  97.7 F (36.5 C)  Pulse Rate 84 82 82  BP 130/67 114/57 130/74  Resp 18  18  SpO2 95 % 93 % 93 %  BSA (m2) 1.61 m2 1.61 m2 1.6 m2     Laboratory Data    Latest Ref Rng & Units 08/27/2022    8:34 AM 07/29/2022   11:41 AM 07/08/2021    3:41 PM  CBC EXTENDED  WBC 4.0 - 10.5 K/uL 7.1  6.7  9.7   RBC 3.87 - 5.11 MIL/uL 4.82  4.64  4.77   Hemoglobin 12.0 - 15.0 g/dL 02.7  25.3  66.4   HCT 36.0 - 46.0 % 45.2  43.5  45.7   Platelets 150 - 400 K/uL 341  341.0  316   NEUT# 1.7 - 7.7 K/uL 4.9  4.9    Lymph# 0.7 - 4.0 K/uL 1.1  1.1         Latest Ref Rng & Units 08/27/2022    8:34 AM 07/29/2022   11:41 AM 07/08/2021    3:41 PM  CMP  Glucose 70 - 99 mg/dL 83  403  474   BUN 8 - 23 mg/dL 14  13  10    Creatinine 0.44 - 1.00 mg/dL 2.59  5.63  0.72   Sodium 135 - 145 mmol/L 136  133  136   Potassium 3.5 - 5.1 mmol/L 4.4  4.4  4.6   Chloride 98 - 111 mmol/L 100  97  99   CO2 22 - 32 mmol/L 30  28  27    Calcium 8.9 - 10.3 mg/dL 9.8  9.8  9.4   Total Protein 6.5 - 8.1 g/dL 7.9  7.5  7.5   Total Bilirubin 0.3 - 1.2 mg/dL 0.4  0.4  0.8   Alkaline Phos 38 - 126 U/L 61  58  63   AST 15 - 41 U/L 18  19  92   ALT 0 - 44 U/L 12  13  111     Contraindications Contraindications were reviewed? Yes Contraindications to therapy were identified? Yes   Safety Precautions The following safety precautions for the use of trastuzumab were reviewed:  Fever: reviewed the importance of having a thermometer and the Centers for Disease Control and Prevention (CDC) definition of fever which is 100.76F (38C) or higher. Patient should call 24/7 triage at [phone] if experiencing a fever or any other symptoms Diarrhea Fatigue Headache Muscle or joint pain or weakness Nausea or vomiting Cardiotoxicity Infusion reactions Pneumonitis Fetal harm (if applicable) Handling body fluids and  waste  Medication Reconciliation Current Outpatient Medications  Medication Sig Dispense Refill   omeprazole (PRILOSEC) 20 MG capsule Take 20 mg by mouth daily. Takes once every 3 days     Acetaminophen (TYLENOL PO) Take 500 mg by mouth daily as needed.     amLODipine (NORVASC) 5 MG tablet Take 1 tablet by mouth once daily 90 tablet 2   aspirin EC 81 MG tablet Take by mouth.     Cholecalciferol (VITAMIN D3) 50 MCG (2000 UT) TABS Take 50 mcg by mouth daily.     Coenzyme Q10 (COQ10) 100 MG CAPS Take 3 capsules by mouth daily. 30 capsule    diazepam (VALIUM) 2 MG tablet Take 2 mg by mouth 3 (three) times daily as needed.     levocetirizine (XYZAL) 5 MG tablet Take 5 mg by mouth every evening.     metoprolol succinate (TOPROL-XL) 25 MG 24 hr tablet TAKE 1/2 (ONE-HALF) TABLET BY MOUTH ONCE DAILY WITH OR IMMEDIATELY FOLLOWING A MEAL 45 tablet 1   nitroGLYCERIN (NITROSTAT) 0.4 MG SL tablet 1 tablet under the tongue may be used every 5 minutes as needed, for up to 15 minutes. 25 tablet 3   polyethylene glycol (MIRALAX / GLYCOLAX) 17 g packet Take 17 g by mouth daily.     rosuvastatin (CRESTOR) 20 MG tablet Take 1 tablet by mouth once daily 90 tablet 3   tamoxifen (NOLVADEX) 20 MG tablet Take 1 tablet (20 mg total) by mouth daily. 90 tablet 3   temazepam (RESTORIL) 30 MG capsule Take 1 capsule (30 mg total) by mouth at bedtime as needed for sleep. (Patient taking differently: Take 30 mg by mouth at bedtime.) 30 capsule 5   Triamcinolone Acetonide (NASACORT ALLERGY 24HR NA) Place 1 spray into the nose daily.     No current facility-administered medications for this visit.    Medication reconciliation is based on the patient's most recent medication list in the electronic medical record (EMR) including herbal products and OTC medications.   The patient's medication list was reviewed today with the patient? Yes   Drug-drug interactions (DDIs) DDIs were evaluated? Yes Significant DDIs identified? No    Drug-Food Interactions Drug-food interactions were evaluated?  Yes Drug-food interactions identified? No   Follow-up Plan  Treatment start date: 11/07/22 ECHO: 10/17/22 We reviewed the prescriptions, premedications, and treatment regimen with the patient. Possible side effects of the treatment regimen were reviewed and management strategies were discussed.  Can use loperamide as needed for diarrhea. Will send in prescription for ondansetron to take as needed for any nausea and vomiting that may occur from treatment.  Clinical pharmacy will assist Dr. Serena Croissant and [name] on an as needed basis going forward  [name] participated in the discussion, expressed understanding, and voiced agreement with the above plan. All questions were answered to her satisfaction. The patient was advised to contact the clinic at [phone] with any questions or concerns prior to her return visit.   I spent 45 minutes assessing the patient.  Jerry Caras, PharmD PGY2 Oncology Pharmacy Resident   11/04/2022 11:59 AM

## 2022-11-04 NOTE — Telephone Encounter (Signed)
Called patient and informed her of results and recommendations per Dr. Allena Katz . Patient stated she will contact us when she is ready to proceed with PT as she is going thorough cancer treatments right now. Patient verbalized understanding of all explained to her and had no further questions or concerns.

## 2022-11-05 DIAGNOSIS — Z170 Estrogen receptor positive status [ER+]: Secondary | ICD-10-CM | POA: Diagnosis not present

## 2022-11-05 DIAGNOSIS — Z510 Encounter for antineoplastic radiation therapy: Secondary | ICD-10-CM | POA: Diagnosis not present

## 2022-11-05 DIAGNOSIS — C50412 Malignant neoplasm of upper-outer quadrant of left female breast: Secondary | ICD-10-CM | POA: Diagnosis not present

## 2022-11-07 ENCOUNTER — Inpatient Hospital Stay: Payer: Medicare PPO

## 2022-11-07 VITALS — BP 123/66 | HR 79 | Temp 98.1°F | Resp 18 | Wt 124.8 lb

## 2022-11-07 DIAGNOSIS — Z170 Estrogen receptor positive status [ER+]: Secondary | ICD-10-CM | POA: Diagnosis not present

## 2022-11-07 DIAGNOSIS — Z5112 Encounter for antineoplastic immunotherapy: Secondary | ICD-10-CM | POA: Diagnosis not present

## 2022-11-07 DIAGNOSIS — C50412 Malignant neoplasm of upper-outer quadrant of left female breast: Secondary | ICD-10-CM | POA: Diagnosis not present

## 2022-11-07 DIAGNOSIS — M810 Age-related osteoporosis without current pathological fracture: Secondary | ICD-10-CM | POA: Diagnosis not present

## 2022-11-07 MED ORDER — ACETAMINOPHEN 325 MG PO TABS
650.0000 mg | ORAL_TABLET | Freq: Once | ORAL | Status: AC
  Administered 2022-11-07: 650 mg via ORAL
  Filled 2022-11-07: qty 2

## 2022-11-07 MED ORDER — SODIUM CHLORIDE 0.9 % IV SOLN: Freq: Once | INTRAVENOUS | Status: AC

## 2022-11-07 MED ORDER — DIPHENHYDRAMINE HCL 25 MG PO CAPS
50.0000 mg | ORAL_CAPSULE | Freq: Once | ORAL | Status: AC
  Administered 2022-11-07: 50 mg via ORAL
  Filled 2022-11-07: qty 2

## 2022-11-07 MED ORDER — TRASTUZUMAB-ANNS CHEMO 150 MG IV SOLR
8.0000 mg/kg | Freq: Once | INTRAVENOUS | Status: AC
  Administered 2022-11-07: 420 mg via INTRAVENOUS
  Filled 2022-11-07: qty 20

## 2022-11-07 NOTE — Patient Instructions (Addendum)
[facility]  Discharge Instructions: Thank you for choosing [facility] to provide your oncology and hematology care.   If you have a lab appointment with the Cancer Center, please go directly to the Cancer Center and check in at the registration area.   Wear comfortable clothing and clothing appropriate for easy access to any Portacath or PICC line.   We strive to give you quality time with your provider. You may need to reschedule your appointment if you arrive late (15 or more minutes).  Arriving late affects you and other patients whose appointments are after yours.  Also, if you miss three or more appointments without notifying the office, you may be dismissed from the clinic at the provider's discretion.      For prescription refill requests, have your pharmacy contact our office and allow 72 hours for refills to be completed.    Today you received the following chemotherapy and/or immunotherapy agents: Trastuzumab (Kanjinti)      To help prevent nausea and vomiting after your treatment, we encourage you to take your nausea medication as directed.  BELOW ARE SYMPTOMS THAT SHOULD BE REPORTED IMMEDIATELY: *FEVER GREATER THAN 100.4 F (38 C) OR HIGHER *CHILLS OR SWEATING *NAUSEA AND VOMITING THAT IS NOT CONTROLLED WITH YOUR NAUSEA MEDICATION *UNUSUAL SHORTNESS OF BREATH *UNUSUAL BRUISING OR BLEEDING *URINARY PROBLEMS (pain or burning when urinating, or frequent urination) *BOWEL PROBLEMS (unusual diarrhea, constipation, pain near the anus) TENDERNESS IN MOUTH AND THROAT WITH OR WITHOUT PRESENCE OF ULCERS (sore throat, sores in mouth, or a toothache) UNUSUAL RASH, SWELLING OR PAIN  UNUSUAL VAGINAL DISCHARGE OR ITCHING   Items with * indicate a potential emergency and should be followed up as soon as possible or go to the Emergency Department if any problems should occur.  Please show the CHEMOTHERAPY ALERT CARD or IMMUNOTHERAPY ALERT  CARD at check-in to the Emergency Department and triage nurse.  Should you have questions after your visit or need to cancel or reschedule your appointment, please contact [facility]  Dept: [phone]  and follow the prompts.  Office hours are 8:00 a.m. to 4:30 p.m. Monday - Friday. Please note that voicemails left after 4:00 p.m. may not be returned until the following business day.  We are closed weekends and major holidays. You have access to a nurse at all times for urgent questions. Please call the main number to the clinic Dept: [phone] and follow the prompts.   For any non-urgent questions, you may also contact your provider using MyChart. We now offer e-Visits for anyone 6 and older to request care online for non-urgent symptoms. For details visit [email or web address].   Also download the MyChart app! Go to the app store, search "MyChart", open the app, select [place], and log in with your MyChart username and password.  Trastuzumab Injection What is this medication? TRASTUZUMAB (tras TOO zoo mab) treats breast cancer and stomach cancer. It works by blocking a protein that causes cancer cells to grow and multiply. This helps to slow or stop the spread of cancer cells. This medicine may be used for other purposes; ask your health care provider or pharmacist if you have questions. COMMON BRAND NAME(S): Herceptin, Marlowe Alt, Ontruzant, Trazimera What should I tell my care team before I take this medication? They need to know if you have any of these conditions: Heart failure Lung disease An unusual or allergic reaction to trastuzumab, other  medications, foods, dyes, or preservatives Pregnant or trying to get pregnant Breast-feeding How should I use this medication? This medication is injected into a vein. It is given by your care team in a hospital or clinic setting. Talk to your care team about the use of this  medication in children. It is not approved for use in children. Overdosage: If you think you have taken too much of this medicine contact a poison control center or emergency room at once. NOTE: This medicine is only for you. Do not share this medicine with others. What if I miss a dose? Keep appointments for follow-up doses. It is important not to miss your dose. Call your care team if you are unable to keep an appointment. What may interact with this medication? Certain types of chemotherapy, such as daunorubicin, doxorubicin, epirubicin, idarubicin This list may not describe all possible interactions. Give your health care provider a list of all the medicines, herbs, non-prescription drugs, or dietary supplements you use. Also tell them if you smoke, drink alcohol, or use illegal drugs. Some items may interact with your medicine. What should I watch for while using this medication? Your condition will be monitored carefully while you are receiving this medication. This medication may make you feel generally unwell. This is not uncommon, as chemotherapy affects healthy cells as well as cancer cells. Report any side effects. Continue your course of treatment even though you feel ill unless your care team tells you to stop. This medication may increase your risk of getting an infection. Call your care team for advice if you get a fever, chills, sore throat, or other symptoms of a cold or flu. Do not treat yourself. Try to avoid being around people who are sick. Avoid taking medications that contain aspirin, acetaminophen, ibuprofen, naproxen, or ketoprofen unless instructed by your care team. These medications can hide a fever. Talk to your care team if you may be pregnant. Serious birth defects can occur if you take this medication during pregnancy and for 7 months after the last dose. You will need a negative pregnancy test before starting this medication. Contraception is recommended while taking  this medication and for 7 months after the last dose. Your care team can help you find the option that works for you. Do not breastfeed while taking this medication and for 7 months after stopping treatment. What side effects may I notice from receiving this medication? Side effects that you should report to your care team as soon as possible: Allergic reactions or angioedema--skin rash, itching or hives, swelling of the face, eyes, lips, tongue, arms, or legs, trouble swallowing or breathing Dry cough, shortness of breath or trouble breathing Heart failure--shortness of breath, swelling of the ankles, feet, or hands, sudden weight gain, unusual weakness or fatigue Infection--fever, chills, cough, or sore throat Infusion reactions--chest pain, shortness of breath or trouble breathing, feeling faint or lightheaded Side effects that usually do not require medical attention (report to your care team if they continue or are bothersome): Diarrhea Dizziness Headache Nausea Trouble sleeping Vomiting This list may not describe all possible side effects. Call your doctor for medical advice about side effects. You may report side effects to FDA at 1-800-FDA-1088. Where should I keep my medication? This medication is given in a hospital or clinic. It will not be stored at home. NOTE: This sheet is a summary. It may not cover all possible information. If you have questions about this medicine, talk to your doctor, pharmacist, or health  care provider.  2024 Elsevier/Gold Standard (2021-05-28 00:00:00)

## 2022-11-10 ENCOUNTER — Other Ambulatory Visit: Payer: Self-pay

## 2022-11-10 ENCOUNTER — Ambulatory Visit: Payer: Medicare PPO

## 2022-11-10 ENCOUNTER — Ambulatory Visit
Admission: RE | Admit: 2022-11-10 | Discharge: 2022-11-10 | Disposition: A | Discharge status: Home / Self Care | Payer: Medicare PPO | Source: Ambulatory Visit | Attending: Radiation Oncology | Admitting: Radiation Oncology

## 2022-11-10 DIAGNOSIS — Z170 Estrogen receptor positive status [ER+]: Secondary | ICD-10-CM

## 2022-11-10 DIAGNOSIS — C50412 Malignant neoplasm of upper-outer quadrant of left female breast: Secondary | ICD-10-CM | POA: Diagnosis not present

## 2022-11-10 DIAGNOSIS — Z510 Encounter for antineoplastic radiation therapy: Secondary | ICD-10-CM | POA: Diagnosis not present

## 2022-11-10 LAB — RAD ONC ARIA SESSION SUMMARY
Course Elapsed Days: 0
Plan Fractions Treated to Date: 1
Plan Prescribed Dose Per Fraction: 5.2 Gy
Plan Total Fractions Prescribed: 5
Plan Total Prescribed Dose: 26 Gy
Reference Point Dosage Given to Date: 5.2 Gy
Reference Point Session Dosage Given: 5.2 Gy
Session Number: 1

## 2022-11-10 MED ORDER — ALRA NON-METALLIC DEODORANT (RAD-ONC)
1.0000 | Freq: Once | TOPICAL | Status: AC
  Administered 2022-11-10: 1 via TOPICAL

## 2022-11-10 MED ORDER — RADIAPLEXRX EX GEL
Freq: Once | CUTANEOUS | Status: AC
  Administered 2022-11-10: 1 via TOPICAL

## 2022-11-11 ENCOUNTER — Other Ambulatory Visit: Payer: Self-pay

## 2022-11-11 ENCOUNTER — Ambulatory Visit
Admission: RE | Admit: 2022-11-11 | Discharge: 2022-11-11 | Disposition: A | Discharge status: Home / Self Care | Payer: Medicare PPO | Source: Ambulatory Visit | Attending: Radiation Oncology

## 2022-11-11 DIAGNOSIS — C50412 Malignant neoplasm of upper-outer quadrant of left female breast: Secondary | ICD-10-CM | POA: Diagnosis not present

## 2022-11-11 DIAGNOSIS — Z510 Encounter for antineoplastic radiation therapy: Secondary | ICD-10-CM | POA: Diagnosis not present

## 2022-11-11 DIAGNOSIS — Z170 Estrogen receptor positive status [ER+]: Secondary | ICD-10-CM | POA: Diagnosis not present

## 2022-11-11 LAB — RAD ONC ARIA SESSION SUMMARY
Course Elapsed Days: 1
Plan Fractions Treated to Date: 2
Plan Prescribed Dose Per Fraction: 5.2 Gy
Plan Total Fractions Prescribed: 5
Plan Total Prescribed Dose: 26 Gy
Reference Point Dosage Given to Date: 10.4 Gy
Reference Point Session Dosage Given: 5.2 Gy
Session Number: 2

## 2022-11-12 ENCOUNTER — Ambulatory Visit: Payer: Medicare PPO | Admitting: Radiation Oncology

## 2022-11-12 ENCOUNTER — Other Ambulatory Visit: Payer: Self-pay

## 2022-11-12 ENCOUNTER — Ambulatory Visit
Admission: RE | Admit: 2022-11-12 | Discharge: 2022-11-12 | Disposition: A | Discharge status: Home / Self Care | Payer: Medicare PPO | Source: Ambulatory Visit | Attending: Radiation Oncology | Admitting: Radiation Oncology

## 2022-11-12 DIAGNOSIS — Z170 Estrogen receptor positive status [ER+]: Secondary | ICD-10-CM | POA: Diagnosis not present

## 2022-11-12 DIAGNOSIS — Z510 Encounter for antineoplastic radiation therapy: Secondary | ICD-10-CM | POA: Diagnosis not present

## 2022-11-12 DIAGNOSIS — C50412 Malignant neoplasm of upper-outer quadrant of left female breast: Secondary | ICD-10-CM | POA: Diagnosis not present

## 2022-11-12 LAB — RAD ONC ARIA SESSION SUMMARY
Course Elapsed Days: 2
Plan Fractions Treated to Date: 3
Plan Prescribed Dose Per Fraction: 5.2 Gy
Plan Total Fractions Prescribed: 5
Plan Total Prescribed Dose: 26 Gy
Reference Point Dosage Given to Date: 15.6 Gy
Reference Point Session Dosage Given: 5.2 Gy
Session Number: 3

## 2022-11-13 ENCOUNTER — Ambulatory Visit
Admission: RE | Admit: 2022-11-13 | Discharge: 2022-11-13 | Disposition: A | Discharge status: Home / Self Care | Payer: Medicare PPO | Source: Ambulatory Visit | Attending: Radiation Oncology | Admitting: Radiation Oncology

## 2022-11-13 ENCOUNTER — Other Ambulatory Visit: Payer: Self-pay

## 2022-11-13 DIAGNOSIS — Z510 Encounter for antineoplastic radiation therapy: Secondary | ICD-10-CM | POA: Diagnosis not present

## 2022-11-13 DIAGNOSIS — C50412 Malignant neoplasm of upper-outer quadrant of left female breast: Secondary | ICD-10-CM | POA: Diagnosis not present

## 2022-11-13 DIAGNOSIS — Z170 Estrogen receptor positive status [ER+]: Secondary | ICD-10-CM | POA: Diagnosis not present

## 2022-11-13 LAB — RAD ONC ARIA SESSION SUMMARY
Course Elapsed Days: 3
Plan Fractions Treated to Date: 4
Plan Prescribed Dose Per Fraction: 5.2 Gy
Plan Total Fractions Prescribed: 5
Plan Total Prescribed Dose: 26 Gy
Reference Point Dosage Given to Date: 20.8 Gy
Reference Point Session Dosage Given: 5.2 Gy
Session Number: 4

## 2022-11-14 ENCOUNTER — Telehealth: Payer: Self-pay

## 2022-11-14 ENCOUNTER — Other Ambulatory Visit: Payer: Self-pay

## 2022-11-14 ENCOUNTER — Ambulatory Visit: Payer: Medicare PPO

## 2022-11-14 ENCOUNTER — Ambulatory Visit: Admission: RE | Admit: 2022-11-14 | Payer: Medicare PPO | Source: Ambulatory Visit

## 2022-11-14 ENCOUNTER — Ambulatory Visit
Admission: RE | Admit: 2022-11-14 | Discharge: 2022-11-14 | Disposition: A | Discharge status: Home / Self Care | Payer: Medicare PPO | Source: Ambulatory Visit | Attending: Radiation Oncology

## 2022-11-14 DIAGNOSIS — Z170 Estrogen receptor positive status [ER+]: Secondary | ICD-10-CM | POA: Diagnosis not present

## 2022-11-14 DIAGNOSIS — C50412 Malignant neoplasm of upper-outer quadrant of left female breast: Secondary | ICD-10-CM | POA: Diagnosis not present

## 2022-11-14 DIAGNOSIS — Z510 Encounter for antineoplastic radiation therapy: Secondary | ICD-10-CM | POA: Diagnosis not present

## 2022-11-14 LAB — RAD ONC ARIA SESSION SUMMARY
Course Elapsed Days: 4
Plan Fractions Treated to Date: 5
Plan Prescribed Dose Per Fraction: 5.2 Gy
Plan Total Fractions Prescribed: 5
Plan Total Prescribed Dose: 26 Gy
Reference Point Dosage Given to Date: 26 Gy
Reference Point Session Dosage Given: 5.2 Gy
Session Number: 5

## 2022-11-14 NOTE — Telephone Encounter (Signed)
RN called pt daughter to let her know that is safe for her to get her upcoming flu vaccine. Rn Victorino Dike reached out to [employer] who confirmed it was also ok with Dr. Pamelia Hoit to get this vaccine.

## 2022-11-17 ENCOUNTER — Ambulatory Visit: Payer: Medicare PPO

## 2022-11-17 NOTE — Radiation Completion Notes (Signed)
Patient Name: JETT, STANG MRN: [id number] Date of Birth: 31-Oct-1945 Referring Physician: Serena Croissant, M.D. Date of Service: 2022-11-17 Radiation Oncologist: Lonie Peak, M.D. 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 Staging on 2022-08-27: Malignant neoplasm of upper-outer quadrant of left breast in female, estrogen receptor positive (HCC) T=cT1c, N=cN0, M=cM0 Intent: Curative     ==========DELIVERED PLANS==========  First Treatment Date: 2022-11-10 - Last Treatment Date: 2022-11-14   Plan Name: Breast_L Site: Breast, Left Technique: 3D Mode: Photon Dose Per Fraction: 5.2 Gy Prescribed Dose (Delivered / Prescribed): 26 Gy / 26 Gy Prescribed Fxs (Delivered / Prescribed): 5 / 5     ==========ON TREATMENT VISIT DATES========== 2022-11-14     ==========UPCOMING VISITS==========       ==========APPENDIX - ON TREATMENT VISIT NOTES==========   See weekly On Treatment Notes in Epic for details.

## 2022-11-18 ENCOUNTER — Ambulatory Visit: Payer: Medicare PPO

## 2022-11-19 ENCOUNTER — Ambulatory Visit: Payer: Medicare PPO

## 2022-11-26 ENCOUNTER — Encounter: Payer: Self-pay | Admitting: Cardiovascular Disease

## 2022-11-26 ENCOUNTER — Ambulatory Visit: Payer: Medicare PPO

## 2022-11-26 ENCOUNTER — Ambulatory Visit: Payer: Medicare PPO | Attending: Cardiovascular Disease | Admitting: Cardiovascular Disease

## 2022-11-26 VITALS — BP 122/62 | HR 75 | Ht 65.0 in | Wt 126.0 lb

## 2022-11-26 DIAGNOSIS — Z9221 Personal history of antineoplastic chemotherapy: Secondary | ICD-10-CM

## 2022-11-26 DIAGNOSIS — I700 Atherosclerosis of aorta: Secondary | ICD-10-CM | POA: Diagnosis not present

## 2022-11-26 DIAGNOSIS — E7800 Pure hypercholesterolemia, unspecified: Secondary | ICD-10-CM | POA: Diagnosis not present

## 2022-11-26 DIAGNOSIS — I25118 Atherosclerotic heart disease of native coronary artery with other forms of angina pectoris: Secondary | ICD-10-CM | POA: Diagnosis not present

## 2022-11-26 DIAGNOSIS — F17200 Nicotine dependence, unspecified, uncomplicated: Secondary | ICD-10-CM | POA: Diagnosis not present

## 2022-11-26 NOTE — Patient Instructions (Signed)
Medication Instructions:  No changes *If you need a refill on your cardiac medications before your next appointment, please call your pharmacy*  Testing/Procedures: Your physician has requested that you have an echocardiogram in December 2024. Echocardiography is a painless test that uses sound waves to create images of your heart. It provides your doctor with information about the size and shape of your heart and how well your heart's chambers and valves are working. This procedure takes approximately one hour. There are no restrictions for this procedure. Please do NOT wear cologne, perfume, aftershave, or lotions (deodorant is allowed). Please arrive 15 minutes prior to your appointment time.    Follow-Up: At [facility], you and your health needs are our priority.  As part of our continuing mission to provide you with exceptional heart care, we have created designated Provider Care Teams.  These Care Teams include your primary Cardiologist (physician) and Advanced Practice Providers (APPs -  Physician Assistants and Nurse Practitioners) who all work together to provide you with the care you need, when you need it.  We recommend signing up for the patient portal called "MyChart".  Sign up information is provided on this After Visit Summary.  MyChart is used to connect with patients for Virtual Visits (Telemedicine).  Patients are able to view lab/test results, encounter notes, upcoming appointments, etc.  Non-urgent messages can be sent to your provider as well.   To learn more about what you can do with MyChart, go to [email or web address].    Your next appointment:   1 year(s)  Provider:   Thurmon Fair, MD

## 2022-11-26 NOTE — Progress Notes (Unsigned)
Cardiology Office Note:    Date:  11/27/2022   ID:  [facility], DOB [birth year], MRN [id number]  PCP:  Corwin Levins, MD  [facility] Cardiologist:  Thurmon Fair, MD [facility] Electrophysiologist:  None   Referring MD: Corwin Levins, MD   Chief Complaint  Patient presents with   Coronary Artery Disease     History of Present Illness:    Michelle [facility] is a 77 y.o. female with CAD a hx of smoking and hypercholesterolemia, returning for follow-up.    She has no cardiovascular complaints.  She has not had chest pain and has not taken nitroglycerin in over a year.  She remains underweight with a BMI that is just under 20.  Unfortunately she continues to smoke about a pack of cigarettes a day (45-pack-year history of smoking).  Occasionally she has wheezing.  The patient specifically denies any chest pain at rest exertion, dyspnea at rest or with exertion, orthopnea, paroxysmal nocturnal dyspnea, syncope, palpitations, focal neurological deficits, intermittent claudication, lower extremity edema, unexplained weight gain, cough, hemoptysis.  She underwent right breast lumpectomy and external beam radiation therapy for breast cancer and is now receiving Herceptin (just received her first infusion).  Just before starting the medication she underwent an echo that shows normal left ventricular ejection fraction (65 to 70%) and grade 1 diastolic dysfunction.  There is borderline elevation in her systolic PA pressure.  Otherwise her echocardiogram was normal.  She did not tolerate isosorbide, but her angina is well controlled on a combination of metoprolol and amlodipine.  She is taking rosuvastatin 20 mg daily and her most recent LDL cholesterol was 51.  Despite her very slender build, her hemoglobin A1c remains borderline at 5.9%.  She was initially referred after a coronary calcium CT which showed a Agatston score of 100 (91st percentile), the study being performed due to  incidental finding of heavy coronary calcification on a screening CT for lung cancer.  She had symptoms of mild stable angina pectoris that she did not recognize as such.  Coronary CT angiography performed in September 2021 showed numerous mild and moderate stenotic lesions, especially in the mid and distal LAD artery and the distal left circumflex coronary artery (the latter lesion being the only one that appeared to be hemodynamically significant, with CT-FFR 0.73).  She did not tolerate long-acting nitrates, but angina is well-controlled on a combination of beta-blocker and calcium channel blocker.    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   COPD 01/08/2010   DEPRESSIVE DISORDER 12/23/2006   Husband passed   FATIGUE 01/08/2010   HYPERLIPIDEMIA 01/08/2010   Hypertension    LOW BACK PAIN 12/23/2006   OAB (overactive bladder) 10/07/2011   OSTEOPOROSIS 01/08/2010   Other specified forms of hearing loss 01/08/2010   PONV (postoperative nausea and vomiting)    SINUSITIS- ACUTE-NOS 07/09/2007   Urticaria    Vitamin D deficiency 04/30/2010    Past Surgical History:  Procedure Laterality Date   ABDOMINAL HYSTERECTOMY     APPENDECTOMY  1988   BREAST BIOPSY  1986   BREAST BIOPSY Left 08/18/2022   [place] LT BREAST BX W LOC DEV 1ST LESION IMG BX SPEC US GUIDE 08/18/2022 [facility]   BREAST BIOPSY  09/17/2022   MM LT RADIOACTIVE SEED LOC MAMMO GUIDE 09/17/2022 [facility]   BREAST LUMPECTOMY WITH RADIOACTIVE SEED LOCALIZATION Left 09/18/2022   Procedure: LEFT BREAST SEED LUMPECTOMY;  Surgeon: Harriette Bouillon,  MD;  Location: [facility];  Service: General;  Laterality: Left;   COLON RESECTION N/A 06/03/2013   Procedure: LAPAROSCOPY DIAGNOSTIC and OPEN  SIGMOID COLON RESECTION ;  Surgeon: Axel Filler, MD;  Location: [facility];  Service: General;  Laterality: N/A;   COLON SURGERY  2000   colon obstruction   larygenscopy      OOPHORECTOMY  bilat 1988   secondary to cyst    Current Medications: Current Meds  Medication Sig   Acetaminophen (TYLENOL PO) Take 500 mg by mouth daily as needed.   amLODipine (NORVASC) 5 MG tablet Take 1 tablet by mouth once daily   aspirin EC 81 MG tablet Take by mouth.   Cholecalciferol (VITAMIN D3) 50 MCG (2000 UT) TABS Take 50 mcg by mouth daily.   Coenzyme Q10 (COQ10) 100 MG CAPS Take 3 capsules by mouth daily.   levocetirizine (XYZAL) 5 MG tablet Take 5 mg by mouth every evening.   metoprolol succinate (TOPROL-XL) 25 MG 24 hr tablet TAKE 1/2 (ONE-HALF) TABLET BY MOUTH ONCE DAILY WITH OR IMMEDIATELY FOLLOWING A MEAL   omeprazole (PRILOSEC) 20 MG capsule Take 20 mg by mouth daily. Takes once every 3 days   ondansetron (ZOFRAN) 8 MG tablet Take 1 tablet (8 mg total) by mouth every 8 (eight) hours as needed for nausea or vomiting.   polyethylene glycol (MIRALAX / GLYCOLAX) 17 g packet Take 17 g by mouth daily.   rosuvastatin (CRESTOR) 20 MG tablet Take 1 tablet by mouth once daily   tamoxifen (NOLVADEX) 20 MG tablet Take 1 tablet (20 mg total) by mouth daily.   temazepam (RESTORIL) 30 MG capsule Take 1 capsule (30 mg total) by mouth at bedtime as needed for sleep. (Patient taking differently: Take 30 mg by mouth at bedtime.)   TRASTUZUMAB IV Inject into the vein every 21 ( twenty-one) days.   Triamcinolone Acetonide (NASACORT ALLERGY 24HR NA) Place 1 spray into the nose daily.     Allergies:   Codeine, Lortab [hydrocodone-acetaminophen], and Penicillins   Social History   Socioeconomic History   Marital status: Widowed    Spouse name: Not on file   Number of children: 3   Years of education: Not on file   Highest education level: Not on file  Occupational History   Occupation: Retired    Associate Professor: Designer, television/film set [employer]  Tobacco Use   Smoking status: Every Day    Current packs/day: 1.00    Average packs/day: 1 pack/day for 44.0 years (44.0 ttl pk-yrs)    Types: Cigarettes    Smokeless tobacco: Never   Tobacco comments:    smoking more due to move/ trying to quit  Vaping Use   Vaping status: Never Used  Substance and Sexual Activity   Alcohol use: Yes    Alcohol/week: 7.0 standard drinks of alcohol    Types: 7 Shots of liquor per week    Comment: vodka nightly   Drug use: No   Sexual activity: Not Currently  Other Topics Concern   Not on file  Social History Narrative   Are you right handed or left handed? Right   Are you currently employed ? N    What is your current occupation?   Do you live at home alone? Y   Who lives with you?    What type of home do you live in: 1 story or 2 story? 1       Social Determinants of Health   Financial Resource Strain:  Low Risk  (09/02/2022)   Overall Financial Resource Strain (CARDIA)    Difficulty of Paying Living Expenses: Not hard at all  Food Insecurity: No Food Insecurity (09/02/2022)   Hunger Vital Sign    Worried About Running Out of Food in the Last Year: Never true    Ran Out of Food in the Last Year: Never true  Transportation Needs: No Transportation Needs (08/28/2022)   PRAPARE - Administrator, Civil Service (Medical): No    Lack of Transportation (Non-Medical): No  Physical Activity: Insufficiently Active (08/20/2021)   Exercise Vital Sign    Days of Exercise per Week: 1 day    Minutes of Exercise per Session: 120 min  Stress: No Stress Concern Present (08/20/2021)   [employer] of Occupational Health - Occupational Stress Questionnaire    Feeling of Stress : Only a little  Social Connections: Moderately Isolated (08/20/2021)   Social Connection and Isolation Panel [NHANES]    Frequency of Communication with Friends and Family: More than three times a week    Frequency of Social Gatherings with Friends and Family: Three times a week    Attends Religious Services: More than 4 times per year    Active Member of Clubs or Organizations: No    Attends Banker Meetings:  Never    Marital Status: Widowed     Family History: The patient's family history includes Colon cancer in her brother and mother; Diabetes in her maternal aunt and maternal grandmother; Migraines in her father. There is no history of Allergic rhinitis, Angioedema, Asthma, Eczema, Immunodeficiency, or Urticaria.  ROS:   Please see the history of present illness.     All other systems reviewed and are negative.  EKGs/Labs/Other Studies Reviewed:    The following studies were reviewed today: Coronary CT and CT FFR  EKG:  EKG is  ordered today.  Shows normal sinus rhythm, normal tracing.  Recent Labs: 07/29/2022: TSH 1.79 08/27/2022: ALT 12; BUN 14; Creatinine 0.66; Hemoglobin 15.1; Platelet Count 341; Potassium 4.4; Sodium 136  Recent Lipid Panel    Component Value Date/Time   CHOL 131 07/29/2022 1141   CHOL 129 06/05/2020 0835   TRIG 66.0 07/29/2022 1141   HDL 67.40 07/29/2022 1141   HDL 62 06/05/2020 0835   CHOLHDL 2 07/29/2022 1141   VLDL 13.2 07/29/2022 1141   LDLCALC 51 07/29/2022 1141   LDLCALC 54 06/05/2020 0835   LDLCALC 119 (H) 08/16/2019 1518   LDLDIRECT 146.3 10/07/2011 0919    Physical Exam:    VS:  BP 122/62 (BP Location: Left Arm, Patient Position: Sitting, Cuff Size: Normal)   Pulse 75   Ht 5\' 5"  (1.651 m)   Wt 126 lb (57.2 kg)   SpO2 90%   BMI 20.97 kg/m     Wt Readings from Last 3 Encounters:  11/26/22 126 lb (57.2 kg)  11/07/22 124 lb 12 oz (56.6 kg)  10/31/22 123 lb 12.8 oz (56.2 kg)      General: Alert, oriented x3, no distress, very lean Head: no evidence of trauma, PERRL, EOMI, no exophtalmos or lid lag, no myxedema, no xanthelasma; normal ears, nose and oropharynx Neck: normal jugular venous pulsations and no hepatojugular reflux; brisk carotid pulses without delay and no carotid bruits Chest: clear to auscultation, no signs of consolidation by percussion or palpation, normal fremitus, symmetrical and full respiratory  excursions Cardiovascular: normal position and quality of the apical impulse, regular rhythm, normal first and second heart sounds,  no murmurs, rubs or gallops Abdomen: no tenderness or distention, no masses by palpation, no abnormal pulsatility or arterial bruits, normal bowel sounds, no hepatosplenomegaly Extremities: no clubbing, cyanosis or edema; 2+ radial, ulnar and brachial pulses bilaterally; 2+ right femoral, posterior tibial and dorsalis pedis pulses; 2+ left femoral, posterior tibial and dorsalis pedis pulses; no subclavian or femoral bruits Neurological: grossly nonfocal Psych: Normal mood and affect    ASSESSMENT:    1. Coronary artery disease of native artery of native heart with stable angina pectoris (HCC)   2. Hypercholesterolemia   3. Atherosclerosis of aorta (HCC)   4. Smoking   5. Status post administration of cardiotoxic chemotherapy       PLAN:    In order of problems listed above:  Stable angina pectoris: Asymptomatic on 2 antianginal medications.  On medical therapy, since the CT angiography did not show high risk anatomy.  (she did not tolerate long-acting nitrates).  On aspirin and statin. HLP: Excellent lipid parameters, continue statin Smoking: Strongly recommended renewed attempts at smoking cessation, likely the single most important intervention to prevent progression of CAD.  They understand that that would have been a hard proposition for her to achieve doing treatment for breast cancer, but she should start planning smoking cessation now. Atherosclerosis of the aorta: Incidentally reported on imaging studies.  Normal caliber aorta.  No clinical PAD. Breast cancer on trastuzumab: Will recheck an echocardiogram in December, after her third infusion of the medication.  We discussed the potential cardiotoxic effects of the medication and the fact that these are reversible when detected early.   Medication Adjustments/Labs and Tests Ordered: Current  medicines are reviewed at length with the patient today.  Concerns regarding medicines are outlined above.  Orders Placed This Encounter  Procedures   ECHOCARDIOGRAM COMPLETE    No orders of the defined types were placed in this encounter.    Patient Instructions  Medication Instructions:  No changes *If you need a refill on your cardiac medications before your next appointment, please call your pharmacy*  Testing/Procedures: Your physician has requested that you have an echocardiogram in December 2024. Echocardiography is a painless test that uses sound waves to create images of your heart. It provides your doctor with information about the size and shape of your heart and how well your heart's chambers and valves are working. This procedure takes approximately one hour. There are no restrictions for this procedure. Please do NOT wear cologne, perfume, aftershave, or lotions (deodorant is allowed). Please arrive 15 minutes prior to your appointment time.    Follow-Up: At [facility], you and your health needs are our priority.  As part of our continuing mission to provide you with exceptional heart care, we have created designated Provider Care Teams.  These Care Teams include your primary Cardiologist (physician) and Advanced Practice Providers (APPs -  Physician Assistants and Nurse Practitioners) who all work together to provide you with the care you need, when you need it.  We recommend signing up for the patient portal called "MyChart".  Sign up information is provided on this After Visit Summary.  MyChart is used to connect with patients for Virtual Visits (Telemedicine).  Patients are able to view lab/test results, encounter notes, upcoming appointments, etc.  Non-urgent messages can be sent to your provider as well.   To learn more about what you can do with MyChart, go to [email or web address].    Your next appointment:   1 year(s)  Provider:   Rachelle Hora  Jahmeir Geisen, MD        Signed, Thurmon Fair, MD  11/27/2022 12:43 PM    [facility]

## 2022-11-27 ENCOUNTER — Encounter: Payer: Self-pay | Admitting: Internal Medicine

## 2022-11-28 ENCOUNTER — Inpatient Hospital Stay: Payer: Medicare PPO | Attending: Licensed Clinical Social Worker

## 2022-11-28 VITALS — BP 122/69 | HR 78 | Temp 97.9°F | Resp 20 | Wt 125.2 lb

## 2022-11-28 DIAGNOSIS — Z170 Estrogen receptor positive status [ER+]: Secondary | ICD-10-CM | POA: Diagnosis not present

## 2022-11-28 DIAGNOSIS — Z5112 Encounter for antineoplastic immunotherapy: Secondary | ICD-10-CM | POA: Insufficient documentation

## 2022-11-28 DIAGNOSIS — C50412 Malignant neoplasm of upper-outer quadrant of left female breast: Secondary | ICD-10-CM | POA: Diagnosis not present

## 2022-11-28 MED ORDER — SODIUM CHLORIDE 0.9 % IV SOLN: Freq: Once | INTRAVENOUS | Status: AC

## 2022-11-28 MED ORDER — TRASTUZUMAB-ANNS CHEMO 150 MG IV SOLR
6.0000 mg/kg | Freq: Once | INTRAVENOUS | Status: AC
  Administered 2022-11-28: 336 mg via INTRAVENOUS
  Filled 2022-11-28: qty 16

## 2022-11-28 MED ORDER — DIPHENHYDRAMINE HCL 25 MG PO CAPS
50.0000 mg | ORAL_CAPSULE | Freq: Once | ORAL | Status: AC
  Administered 2022-11-28: 50 mg via ORAL
  Filled 2022-11-28: qty 2

## 2022-11-28 MED ORDER — ACETAMINOPHEN 325 MG PO TABS
650.0000 mg | ORAL_TABLET | Freq: Once | ORAL | Status: AC
  Administered 2022-11-28: 650 mg via ORAL
  Filled 2022-11-28: qty 2

## 2022-11-28 NOTE — Patient Instructions (Signed)
[facility]  Discharge Instructions: Thank you for choosing [facility] to provide your oncology and hematology care.   If you have a lab appointment with the [facility], please go directly to the [facility] and check in at the registration area.   Wear comfortable clothing and clothing appropriate for easy access to any Portacath or PICC line.   We strive to give you quality time with your provider. You may need to reschedule your appointment if you arrive late (15 or more minutes).  Arriving late affects you and other patients whose appointments are after yours.  Also, if you miss three or more appointments without notifying the office, you may be dismissed from the clinic at the provider's discretion.      For prescription refill requests, have your pharmacy contact our office and allow 72 hours for refills to be completed.    Today you received the following chemotherapy and/or immunotherapy agent: Trastuzumab (Kanjinti)   To help prevent nausea and vomiting after your treatment, we encourage you to take your nausea medication as directed.  BELOW ARE SYMPTOMS THAT SHOULD BE REPORTED IMMEDIATELY: *FEVER GREATER THAN 100.4 F (38 C) OR HIGHER *CHILLS OR SWEATING *NAUSEA AND VOMITING THAT IS NOT CONTROLLED WITH YOUR NAUSEA MEDICATION *UNUSUAL SHORTNESS OF BREATH *UNUSUAL BRUISING OR BLEEDING *URINARY PROBLEMS (pain or burning when urinating, or frequent urination) *BOWEL PROBLEMS (unusual diarrhea, constipation, pain near the anus) TENDERNESS IN MOUTH AND THROAT WITH OR WITHOUT PRESENCE OF ULCERS (sore throat, sores in mouth, or a toothache) UNUSUAL RASH, SWELLING OR PAIN  UNUSUAL VAGINAL DISCHARGE OR ITCHING   Items with * indicate a potential emergency and should be followed up as soon as possible or go to the Emergency Department if any problems should occur.  Please show the CHEMOTHERAPY ALERT CARD or IMMUNOTHERAPY ALERT CARD  at check-in to the Emergency Department and triage nurse.  Should you have questions after your visit or need to cancel or reschedule your appointment, please contact [facility]  Dept: [phone]  and follow the prompts.  Office hours are 8:00 a.m. to 4:30 p.m. Monday - Friday. Please note that voicemails left after 4:00 p.m. may not be returned until the following business day.  We are closed weekends and major holidays. You have access to a nurse at all times for urgent questions. Please call the main number to the clinic Dept: [phone] and follow the prompts.   For any non-urgent questions, you may also contact your provider using MyChart. We now offer e-Visits for anyone 59 and older to request care online for non-urgent symptoms. For details visit [email or web address].   Also download the MyChart app! Go to the app store, search "MyChart", open the app, select [place], and log in with your MyChart username and password. Trastuzumab Injection What is this medication? TRASTUZUMAB (tras TOO zoo mab) treats breast cancer and stomach cancer. It works by blocking a protein that causes cancer cells to grow and multiply. This helps to slow or stop the spread of cancer cells. This medicine may be used for other purposes; ask your health care provider or pharmacist if you have questions. COMMON BRAND NAME(S): Herceptin, Marlowe Alt, Ontruzant, Trazimera What should I tell my care team before I take this medication? They need to know if you have any of these conditions: Heart failure Lung disease An unusual or allergic reaction to trastuzumab, other medications, foods, dyes, or  preservatives Pregnant or trying to get pregnant Breast-feeding How should I use this medication? This medication is injected into a vein. It is given by your care team in a hospital or clinic setting. Talk to your care team about the use of this medication in  children. It is not approved for use in children. Overdosage: If you think you have taken too much of this medicine contact a poison control [facility] or emergency room at once. NOTE: This medicine is only for you. Do not share this medicine with others. What if I miss a dose? Keep appointments for follow-up doses. It is important not to miss your dose. Call your care team if you are unable to keep an appointment. What may interact with this medication? Certain types of chemotherapy, such as daunorubicin, doxorubicin, epirubicin, idarubicin This list may not describe all possible interactions. Give your health care provider a list of all the medicines, herbs, non-prescription drugs, or dietary supplements you use. Also tell them if you smoke, drink alcohol, or use illegal drugs. Some items may interact with your medicine. What should I watch for while using this medication? Your condition will be monitored carefully while you are receiving this medication. This medication may make you feel generally unwell. This is not uncommon, as chemotherapy affects healthy cells as well as cancer cells. Report any side effects. Continue your course of treatment even though you feel ill unless your care team tells you to stop. This medication may increase your risk of getting an infection. Call your care team for advice if you get a fever, chills, sore throat, or other symptoms of a cold or flu. Do not treat yourself. Try to avoid being around people who are sick. Avoid taking medications that contain aspirin, acetaminophen, ibuprofen, naproxen, or ketoprofen unless instructed by your care team. These medications can hide a fever. Talk to your care team if you may be pregnant. Serious birth defects can occur if you take this medication during pregnancy and for 7 months after the last dose. You will need a negative pregnancy test before starting this medication. Contraception is recommended while taking this medication  and for 7 months after the last dose. Your care team can help you find the option that works for you. Do not breastfeed while taking this medication and for 7 months after stopping treatment. What side effects may I notice from receiving this medication? Side effects that you should report to your care team as soon as possible: Allergic reactions or angioedema--skin rash, itching or hives, swelling of the face, eyes, lips, tongue, arms, or legs, trouble swallowing or breathing Dry cough, shortness of breath or trouble breathing Heart failure--shortness of breath, swelling of the ankles, feet, or hands, sudden weight gain, unusual weakness or fatigue Infection--fever, chills, cough, or sore throat Infusion reactions--chest pain, shortness of breath or trouble breathing, feeling faint or lightheaded Side effects that usually do not require medical attention (report to your care team if they continue or are bothersome): Diarrhea Dizziness Headache Nausea Trouble sleeping Vomiting This list may not describe all possible side effects. Call your doctor for medical advice about side effects. You may report side effects to FDA at 1-800-FDA-1088. Where should I keep my medication? This medication is given in a hospital or clinic. It will not be stored at home. NOTE: This sheet is a summary. It may not cover all possible information. If you have questions about this medicine, talk to your doctor, pharmacist, or health care provider.  2024  Elsevier/Gold Standard (2021-05-28 00:00:00)

## 2022-12-03 ENCOUNTER — Ambulatory Visit: Payer: Medicare PPO

## 2022-12-10 ENCOUNTER — Ambulatory Visit: Payer: Medicare PPO

## 2022-12-17 ENCOUNTER — Encounter: Payer: Self-pay | Admitting: Cardiovascular Disease

## 2022-12-17 MED ORDER — AMLODIPINE BESYLATE 5 MG PO TABS: 5.0000 mg | ORAL_TABLET | Freq: Every day | ORAL | 3 refills | Status: DC

## 2022-12-17 MED ORDER — METOPROLOL SUCCINATE ER 25 MG PO TB24: ORAL_TABLET | ORAL | 3 refills | Status: DC

## 2022-12-17 MED ORDER — ROSUVASTATIN CALCIUM 20 MG PO TABS
20.0000 mg | ORAL_TABLET | Freq: Every day | ORAL | 3 refills | Status: AC
Start: 2022-12-17 — End: ?

## 2022-12-18 ENCOUNTER — Ambulatory Visit
Admission: RE | Admit: 2022-12-18 | Discharge: 2022-12-18 | Disposition: A | Discharge status: Home / Self Care | Payer: Medicare PPO | Source: Ambulatory Visit | Attending: Radiation Oncology | Admitting: Radiation Oncology

## 2022-12-18 ENCOUNTER — Encounter: Payer: Self-pay | Admitting: *Deleted

## 2022-12-18 ENCOUNTER — Other Ambulatory Visit: Payer: Self-pay | Admitting: *Deleted

## 2022-12-18 ENCOUNTER — Encounter: Payer: Self-pay | Admitting: Radiation Oncology

## 2022-12-18 DIAGNOSIS — C50412 Malignant neoplasm of upper-outer quadrant of left female breast: Secondary | ICD-10-CM

## 2022-12-18 NOTE — Progress Notes (Signed)
[facility] is here today for follow up post radiation to the breast.   Breast Side: LT   They completed their radiation on: 11/14/2022   Does the patient complain of any of the following: Post radiation skin issues: None Breast Tenderness: None Breast Swelling: None Lymphadema: None Range of Motion limitations: None Fatigue post radiation: Yes Appetite good/fair/poor: Good Hormone Therapy Start: 11/18/2022  Additional comments if applicable: None  This concludes the interaction.  Ruel Favors, LPN

## 2022-12-19 ENCOUNTER — Telehealth: Payer: Self-pay | Admitting: *Deleted

## 2022-12-19 ENCOUNTER — Inpatient Hospital Stay: Payer: Medicare PPO

## 2022-12-19 ENCOUNTER — Inpatient Hospital Stay (HOSPITAL_BASED_OUTPATIENT_CLINIC_OR_DEPARTMENT_OTHER): Payer: Medicare PPO | Admitting: Hematology and Oncology

## 2022-12-19 VITALS — BP 125/68 | HR 93 | Temp 97.9°F | Resp 18 | Ht 65.0 in | Wt 125.1 lb

## 2022-12-19 DIAGNOSIS — Z170 Estrogen receptor positive status [ER+]: Secondary | ICD-10-CM

## 2022-12-19 DIAGNOSIS — Z5112 Encounter for antineoplastic immunotherapy: Secondary | ICD-10-CM | POA: Diagnosis not present

## 2022-12-19 DIAGNOSIS — C50412 Malignant neoplasm of upper-outer quadrant of left female breast: Secondary | ICD-10-CM | POA: Diagnosis not present

## 2022-12-19 LAB — CMP (CANCER CENTER ONLY)
ALT: 12 U/L (ref 0–44)
AST: 18 U/L (ref 15–41)
Albumin: 4.5 g/dL (ref 3.5–5.0)
Alkaline Phosphatase: 60 U/L (ref 38–126)
Anion gap: 7 (ref 5–15)
BUN: 13 mg/dL (ref 8–23)
CO2: 28 mmol/L (ref 22–32)
Calcium: 9.3 mg/dL (ref 8.9–10.3)
Chloride: 98 mmol/L (ref 98–111)
Creatinine: 0.59 mg/dL (ref 0.44–1.00)
GFR, Estimated: >60 mL/min (ref >60)
Glucose, Bld: 104 mg/dL — ABNORMAL HIGH (ref 70–99)
Potassium: 3.8 mmol/L (ref 3.5–5.1)
Sodium: 133 mmol/L — ABNORMAL LOW (ref 135–145)
Total Bilirubin: 0.4 mg/dL (ref <1.2)
Total Protein: 7.9 g/dL (ref 6.5–8.1)

## 2022-12-19 LAB — CBC WITH DIFFERENTIAL (CANCER CENTER ONLY)
Abs Immature Granulocytes: 0.02 10*3/uL (ref 0.00–0.07)
Basophils Absolute: 0 10*3/uL (ref 0.0–0.1)
Basophils Relative: 1 %
Eosinophils Absolute: 0.1 10*3/uL (ref 0.0–0.5)
Eosinophils Relative: 1 %
HCT: 44.4 % (ref 36.0–46.0)
Hemoglobin: 14.5 g/dL (ref 12.0–15.0)
Immature Granulocytes: 0 %
Lymphocytes Relative: 15 %
Lymphs Abs: 1 10*3/uL (ref 0.7–4.0)
MCH: 31.2 pg (ref 26.0–34.0)
MCHC: 32.7 g/dL (ref 30.0–36.0)
MCV: 95.5 fL (ref 80.0–100.0)
Monocytes Absolute: 0.6 10*3/uL (ref 0.1–1.0)
Monocytes Relative: 9 %
Neutro Abs: 5.2 10*3/uL (ref 1.7–7.7)
Neutrophils Relative %: 74 %
Platelet Count: 338 10*3/uL (ref 150–400)
RBC: 4.65 MIL/uL (ref 3.87–5.11)
RDW: 12.6 % (ref 11.5–15.5)
WBC Count: 7 10*3/uL (ref 4.0–10.5)
nRBC: 0 % (ref 0.0–0.2)

## 2022-12-19 MED ORDER — TRASTUZUMAB-ANNS CHEMO 150 MG IV SOLR
6.0000 mg/kg | Freq: Once | INTRAVENOUS | Status: AC
  Administered 2022-12-19: 336 mg via INTRAVENOUS
  Filled 2022-12-19: qty 16

## 2022-12-19 MED ORDER — ACETAMINOPHEN 325 MG PO TABS
650.0000 mg | ORAL_TABLET | Freq: Once | ORAL | Status: AC
  Administered 2022-12-19: 650 mg via ORAL
  Filled 2022-12-19: qty 2

## 2022-12-19 MED ORDER — DIPHENHYDRAMINE HCL 25 MG PO CAPS
50.0000 mg | ORAL_CAPSULE | Freq: Once | ORAL | Status: AC
  Administered 2022-12-19: 50 mg via ORAL
  Filled 2022-12-19: qty 2

## 2022-12-19 MED ORDER — SODIUM CHLORIDE 0.9 % IV SOLN: Freq: Once | INTRAVENOUS | Status: AC

## 2022-12-19 NOTE — Telephone Encounter (Signed)
Due to distance, pt requesting to receive tx at [facility] with Dr. Roswell Nickel.  MD is out of office until 02/05/23.  Pt will continue to receive the next 2 tx's in our office and will then transfer care to [facility] once Dr. Roswell Nickel returns.  [facility] scheduler made aware.

## 2022-12-19 NOTE — Patient Instructions (Signed)
[facility]  Discharge Instructions: Thank you for choosing [facility] to provide your oncology and hematology care.   If you have a lab appointment with the Cancer Center, please go directly to the Cancer Center and check in at the registration area.   Wear comfortable clothing and clothing appropriate for easy access to any Portacath or PICC line.   We strive to give you quality time with your provider. You may need to reschedule your appointment if you arrive late (15 or more minutes).  Arriving late affects you and other patients whose appointments are after yours.  Also, if you miss three or more appointments without notifying the office, you may be dismissed from the clinic at the provider's discretion.      For prescription refill requests, have your pharmacy contact our office and allow 72 hours for refills to be completed.    Today you received the following chemotherapy and/or immunotherapy agents trastuzumab      To help prevent nausea and vomiting after your treatment, we encourage you to take your nausea medication as directed.  BELOW ARE SYMPTOMS THAT SHOULD BE REPORTED IMMEDIATELY: *FEVER GREATER THAN 100.4 F (38 C) OR HIGHER *CHILLS OR SWEATING *NAUSEA AND VOMITING THAT IS NOT CONTROLLED WITH YOUR NAUSEA MEDICATION *UNUSUAL SHORTNESS OF BREATH *UNUSUAL BRUISING OR BLEEDING *URINARY PROBLEMS (pain or burning when urinating, or frequent urination) *BOWEL PROBLEMS (unusual diarrhea, constipation, pain near the anus) TENDERNESS IN MOUTH AND THROAT WITH OR WITHOUT PRESENCE OF ULCERS (sore throat, sores in mouth, or a toothache) UNUSUAL RASH, SWELLING OR PAIN  UNUSUAL VAGINAL DISCHARGE OR ITCHING   Items with * indicate a potential emergency and should be followed up as soon as possible or go to the Emergency Department if any problems should occur.  Please show the CHEMOTHERAPY ALERT CARD or  IMMUNOTHERAPY ALERT CARD at check-in to the Emergency Department and triage nurse.  Should you have questions after your visit or need to cancel or reschedule your appointment, please contact  C[facility] CENTER - A DEPT OF [facility]  Dept: [phone]  and follow the prompts.  Office hours are 8:00 a.m. to 4:30 p.m. Monday - Friday. Please note that voicemails left after 4:00 p.m. may not be returned until the following business day.  We are closed weekends and major holidays. You have access to a nurse at all times for urgent questions. Please call the main number to the clinic Dept: [phone] and follow the prompts.   For any non-urgent questions, you may also contact your provider using MyChart. We now offer e-Visits for anyone 85 and older to request care online for non-urgent symptoms. For details visit [email or web address].   Also download the MyChart app! Go to the app store, search "MyChart", open the app, select , and log in with your MyChart username and password.

## 2022-12-19 NOTE — Assessment & Plan Note (Signed)
08/18/2022: Screening mammogram detected left breast mass upper central 4 o'clock position: 1.3 cm, axilla negative, biopsy: Grade 2 IDC ER 95% PR 0% Ki67 15%, HER2 3+ positive 09/18/2022: Left lumpectomy: Grade 2 IDC 1.2 cm with DCIS intermediate grade, margins negative, LVI not present, ER 100%, PR 0%, HER2 positive by FISH ratio 3, copy #4.95, Ki-67 10%   Treatment plan: Adjuvant Herceptin (patient is not a candidate for chemo): We will check echocardiogram and a bone density test Adjuvant radiation therapy 11/10/2022-11/14/2022 Followed by adjuvant antiestrogen therapy with tamoxifen 20 mg x 5 years (due to osteoporosis and inability to take bisphosphonates) ----------------------------------------------------------------------------------------------------------------------------------------------- 10/22/2022: Echo: EF 65 to 70% Bone density 10/20/2022: T-score -2.8: Osteoporosis: Recommended calcium vitamin D.  Not taking bisphosphonates because of dental issues  Tamoxifen counseling: We discussed the risks and benefits of tamoxifen. These include but not limited to insomnia, hot flashes, mood changes, vaginal dryness, and weight gain. Although rare, serious side effects including endometrial cancer, risk of blood clots were also discussed. We strongly believe that the benefits far outweigh the risks. Patient understands these risks and consented to starting treatment. Planned treatment duration is 5-10 years.    Return to clinic every 3 weeks for Herceptin and every 6 weeks of follow-up with [place]

## 2022-12-19 NOTE — Progress Notes (Signed)
Patient Care Team: Corwin Levins, MD as PCP - General Croitoru, Rachelle Hora, MD as PCP - Cardiology (Cardiology) Pershing Proud, RN as Oncology Nurse Navigator Rogelia Boga, Eileen Stanford, RN as Oncology Nurse Navigator Serena Croissant, MD as Consulting Physician (Hematology and Oncology) Harriette Bouillon, MD as Consulting Physician (General Surgery) Lonie Peak, MD as Attending Physician (Radiation Oncology) Yvonne Kendall, MD as Referring Physician (Ophthalmology) Glendale Chard, DO as Consulting Physician (Neurology)  DIAGNOSIS:  Encounter Diagnosis  Name Primary?   Malignant neoplasm of upper-outer quadrant of left breast in female, estrogen receptor positive (HCC) Yes    SUMMARY OF ONCOLOGIC HISTORY: Oncology History  Malignant neoplasm of upper-outer quadrant of left breast in female, estrogen receptor positive (HCC)  08/18/2022 Initial Diagnosis   Screening mammogram detected left breast mass upper central 4 o'clock position: 1.3 cm, axilla negative, biopsy: Grade 2 IDC ER 95% PR 0% Ki67 15%, HER2 3+ positive   08/27/2022 Cancer Staging   Staging form: Breast, AJCC 8th Edition - Clinical stage from 08/27/2022: Stage IA (cT1c, cN0, cM0, G2, ER+, PR-, HER2+) - Signed by Serena Croissant, MD on 08/27/2022 Stage prefix: Initial diagnosis Histologic grading system: 3 grade system Laterality: Left Staged by: Pathologist and managing physician Stage used in treatment planning: Yes National guidelines used in treatment planning: Yes Type of national guideline used in treatment planning: NCCN   11/07/2022 -  Chemotherapy   Patient is on Treatment Plan : BREAST MAINTENANCE Trastuzumab IV (6) or SQ (600) D1 q21d x 13 cycles       CHIEF COMPLIANT: Follow-up on Herceptin and tamoxifen  HISTORY OF PRESENT ILLNESS:   History of Present Illness   The patient, with a history of estrogen positive breast cancer, presents for a follow-up visit after recently completing radiation therapy. She reports a  significant improvement in fatigue since the completion of radiation therapy. However, she has been experiencing gastrointestinal symptoms, including alternating constipation and loose stools, as well as cramping and gas. These symptoms have been present for the past few months and seem to have worsened after the last infusion. The patient also reports experiencing nausea three days after each treatment.  In addition to the infusions, the patient has recently started taking tamoxifen, an antiestrogen pill. She reports experiencing hot flashes since starting the medication, but these have not been severe enough to disrupt sleep or daily activities. The patient has been taking the medication at night and has not reported any other side effects.         ALLERGIES:  is allergic to codeine, lortab [hydrocodone-acetaminophen], and penicillins.  MEDICATIONS:  Current Outpatient Medications  Medication Sig Dispense Refill   Acetaminophen (TYLENOL PO) Take 500 mg by mouth daily as needed.     amLODipine (NORVASC) 5 MG tablet Take 1 tablet (5 mg total) by mouth daily. 90 tablet 3   aspirin EC 81 MG tablet Take by mouth.     Cholecalciferol (VITAMIN D3) 50 MCG (2000 UT) TABS Take 50 mcg by mouth daily.     Coenzyme Q10 (COQ10) 100 MG CAPS Take 3 capsules by mouth daily. 30 capsule    diazepam (VALIUM) 2 MG tablet Take 2 mg by mouth 3 (three) times daily as needed. (Patient not taking: Reported on 11/26/2022)     levocetirizine (XYZAL) 5 MG tablet Take 5 mg by mouth every evening.     metoprolol succinate (TOPROL-XL) 25 MG 24 hr tablet TAKE 1/2 (ONE-HALF) TABLET BY MOUTH ONCE DAILY WITH OR IMMEDIATELY FOLLOWING  A MEAL 45 tablet 3   nitroGLYCERIN (NITROSTAT) 0.4 MG SL tablet 1 tablet under the tongue may be used every 5 minutes as needed, for up to 15 minutes. (Patient not taking: Reported on 11/26/2022) 25 tablet 3   omeprazole (PRILOSEC) 20 MG capsule Take 20 mg by mouth daily. Takes once every 3 days      ondansetron (ZOFRAN) 8 MG tablet Take 1 tablet (8 mg total) by mouth every 8 (eight) hours as needed for nausea or vomiting. 30 tablet 2   polyethylene glycol (MIRALAX / GLYCOLAX) 17 g packet Take 17 g by mouth daily.     rosuvastatin (CRESTOR) 20 MG tablet Take 1 tablet (20 mg total) by mouth daily. 90 tablet 3   tamoxifen (NOLVADEX) 20 MG tablet Take 1 tablet (20 mg total) by mouth daily. 90 tablet 3   temazepam (RESTORIL) 30 MG capsule Take 1 capsule (30 mg total) by mouth at bedtime as needed for sleep. (Patient taking differently: Take 30 mg by mouth at bedtime.) 30 capsule 5   TRASTUZUMAB IV Inject into the vein every 21 ( twenty-one) days.     Triamcinolone Acetonide (NASACORT ALLERGY 24HR NA) Place 1 spray into the nose daily.     No current facility-administered medications for this visit.   Facility-Administered Medications Ordered in Other Visits  Medication Dose Route Frequency Provider Last Rate Last Admin   trastuzumab-anns (KANJINTI) 336 mg in sodium chloride 0.9 % 250 mL chemo infusion  6 mg/kg (Treatment Plan Recorded) Intravenous Once Serena Croissant, MD        PHYSICAL EXAMINATION: ECOG PERFORMANCE STATUS: 1 - Symptomatic but completely ambulatory  Vitals:   12/19/22 1155  BP: 125/68  Pulse: 93  Resp: 18  Temp: 97.9 F (36.6 C)  SpO2: 93%   Filed Weights   12/19/22 1155  Weight: 125 lb 1.6 oz (56.7 kg)     LABORATORY DATA:  I have reviewed the data as listed    Latest Ref Rng & Units 12/19/2022   11:14 AM 08/27/2022    8:34 AM 07/29/2022   11:41 AM  CMP  Glucose 70 - 99 mg/dL 960  83  454   BUN 8 - 23 mg/dL 13  14  13    Creatinine 0.44 - 1.00 mg/dL 0.98  1.19  1.47   Sodium 135 - 145 mmol/L 133  136  133   Potassium 3.5 - 5.1 mmol/L 3.8  4.4  4.4   Chloride 98 - 111 mmol/L 98  100  97   CO2 22 - 32 mmol/L 28  30  28    Calcium 8.9 - 10.3 mg/dL 9.3  9.8  9.8   Total Protein 6.5 - 8.1 g/dL 7.9  7.9  7.5   Total Bilirubin <1.2 mg/dL 0.4  0.4  0.4   Alkaline  Phos 38 - 126 U/L 60  61  58   AST 15 - 41 U/L 18  18  19    ALT 0 - 44 U/L 12  12  13      Lab Results  Component Value Date   WBC 7.0 12/19/2022   HGB 14.5 12/19/2022   HCT 44.4 12/19/2022   MCV 95.5 12/19/2022   PLT 338 12/19/2022   NEUTROABS 5.2 12/19/2022    ASSESSMENT & PLAN:  Malignant neoplasm of upper-outer quadrant of left breast in female, estrogen receptor positive (HCC) 08/18/2022: Screening mammogram detected left breast mass upper central 4 o'clock position: 1.3 cm, axilla negative, biopsy: Grade 2 IDC  ER 95% PR 0% Ki67 15%, HER2 3+ positive 09/18/2022: Left lumpectomy: Grade 2 IDC 1.2 cm with DCIS intermediate grade, margins negative, LVI not present, ER 100%, PR 0%, HER2 positive by FISH ratio 3, copy #4.95, Ki-67 10%   Treatment plan: Adjuvant Herceptin (patient is not a candidate for chemo): We will check echocardiogram and a bone density test Adjuvant radiation therapy 11/10/2022-11/14/2022 Followed by adjuvant antiestrogen therapy with tamoxifen 20 mg x 5 years (due to osteoporosis and inability to take bisphosphonates) ----------------------------------------------------------------------------------------------------------------------------------------------- 10/22/2022: Echo: EF 65 to 70% Bone density 10/20/2022: T-score -2.8: Osteoporosis: Recommended calcium vitamin D.  Not taking bisphosphonates because of dental issues (therefore we are using tamoxifen for antiestrogen therapy)  Tamoxifen toxicities: Mild hot flashes.  She is able to manage these fairly well and can continue with the tamoxifen.       Gastrointestinal Side Effects from Infusion Therapy   Reports alternating constipation and loose stools, with cramping and gas. Symptoms improved this week. Last infusion seemed to exacerbate symptoms.   -Continue to monitor symptoms. If she worsens, consider adjusting treatment plan.    Herceptin Infusion   Last scheduled treatment is on June 20th, 2025. Patient  has been tolerating treatment well.   -Continue Herceptin infusions every three weeks.   -Transition care to closer cancer center in [place] for convenience. Schedule consultation with Dr. Roswell Nickel.    Dental Health   Concerns about dental hygiene and potential infection of dental implant. Patient has regular dental cleanings every three to four months.   -Continue regular dental cleanings as she does not pose a risk with current treatment regimen.    Echocardiogram   Scheduled by patient's cardiologist.       Return to clinic every 3 weeks for Herceptin.  She lives closer to [place] and would like to continue her treatment closer to home.  I will request Dr. Roswell Nickel to assume her care to initiate treatment at [facility].     No orders of the defined types were placed in this encounter.  The patient has a good understanding of the overall plan. she agrees with it. she will call with any problems that may develop before the next visit here. Total time spent: 30 mins including face to face time and time spent for planning, charting and co-ordination of care   Tamsen Meek, MD 12/19/22

## 2022-12-22 ENCOUNTER — Encounter: Payer: Self-pay | Admitting: *Deleted

## 2023-01-05 ENCOUNTER — Ambulatory Visit (HOSPITAL_COMMUNITY): Payer: Medicare PPO | Attending: Cardiology

## 2023-01-05 DIAGNOSIS — Z9221 Personal history of antineoplastic chemotherapy: Secondary | ICD-10-CM | POA: Insufficient documentation

## 2023-01-05 DIAGNOSIS — E785 Hyperlipidemia, unspecified: Secondary | ICD-10-CM | POA: Diagnosis not present

## 2023-01-05 DIAGNOSIS — J449 Chronic obstructive pulmonary disease, unspecified: Secondary | ICD-10-CM | POA: Insufficient documentation

## 2023-01-05 DIAGNOSIS — Z859 Personal history of malignant neoplasm, unspecified: Secondary | ICD-10-CM | POA: Diagnosis not present

## 2023-01-05 DIAGNOSIS — I10 Essential (primary) hypertension: Secondary | ICD-10-CM | POA: Insufficient documentation

## 2023-01-05 DIAGNOSIS — R5383 Other fatigue: Secondary | ICD-10-CM | POA: Insufficient documentation

## 2023-01-05 LAB — ECHOCARDIOGRAM COMPLETE
Area-P 1/2: 4.54 cm2
S' Lateral: 3.2 cm

## 2023-01-07 ENCOUNTER — Encounter: Payer: Self-pay | Admitting: Adult Health

## 2023-01-08 ENCOUNTER — Telehealth: Payer: Self-pay | Admitting: *Deleted

## 2023-01-08 NOTE — Telephone Encounter (Signed)
Received call from pt stating her dentist has placed her on Amoxicillin for a gum infection.  Pt requesting advice from MD if okay to continue Amoxicillin and receive tx's.  Per MD okay to proceed. Pt educated and verbalized understanding.

## 2023-01-09 ENCOUNTER — Inpatient Hospital Stay: Payer: Medicare PPO | Attending: Licensed Clinical Social Worker

## 2023-01-09 VITALS — BP 139/71 | HR 85 | Temp 98.0°F | Resp 18 | Wt 124.0 lb

## 2023-01-09 DIAGNOSIS — Z5112 Encounter for antineoplastic immunotherapy: Secondary | ICD-10-CM | POA: Diagnosis not present

## 2023-01-09 DIAGNOSIS — Z170 Estrogen receptor positive status [ER+]: Secondary | ICD-10-CM | POA: Insufficient documentation

## 2023-01-09 DIAGNOSIS — C50412 Malignant neoplasm of upper-outer quadrant of left female breast: Secondary | ICD-10-CM | POA: Insufficient documentation

## 2023-01-09 MED ORDER — ACETAMINOPHEN 325 MG PO TABS
650.0000 mg | ORAL_TABLET | Freq: Once | ORAL | Status: AC
  Administered 2023-01-09: 650 mg via ORAL
  Filled 2023-01-09: qty 2

## 2023-01-09 MED ORDER — TRASTUZUMAB-ANNS CHEMO 150 MG IV SOLR
6.0000 mg/kg | Freq: Once | INTRAVENOUS | Status: AC
  Administered 2023-01-09: 336 mg via INTRAVENOUS
  Filled 2023-01-09: qty 16

## 2023-01-09 MED ORDER — DIPHENHYDRAMINE HCL 25 MG PO CAPS
50.0000 mg | ORAL_CAPSULE | Freq: Once | ORAL | Status: AC
  Administered 2023-01-09: 50 mg via ORAL
  Filled 2023-01-09: qty 2

## 2023-01-09 MED ORDER — SODIUM CHLORIDE 0.9 % IV SOLN: Freq: Once | INTRAVENOUS | Status: AC

## 2023-01-09 NOTE — Patient Instructions (Signed)
[facility]  Discharge Instructions: Thank you for choosing [facility] to provide your oncology and hematology care.   If you have a lab appointment with the Cancer Center, please go directly to the Cancer Center and check in at the registration area.   Wear comfortable clothing and clothing appropriate for easy access to any Portacath or PICC line.   We strive to give you quality time with your provider. You may need to reschedule your appointment if you arrive late (15 or more minutes).  Arriving late affects you and other patients whose appointments are after yours.  Also, if you miss three or more appointments without notifying the office, you may be dismissed from the clinic at the provider's discretion.      For prescription refill requests, have your pharmacy contact our office and allow 72 hours for refills to be completed.    Today you received the following chemotherapy and/or immunotherapy agents: trastuzumab      To help prevent nausea and vomiting after your treatment, we encourage you to take your nausea medication as directed.  BELOW ARE SYMPTOMS THAT SHOULD BE REPORTED IMMEDIATELY: *FEVER GREATER THAN 100.4 F (38 C) OR HIGHER *CHILLS OR SWEATING *NAUSEA AND VOMITING THAT IS NOT CONTROLLED WITH YOUR NAUSEA MEDICATION *UNUSUAL SHORTNESS OF BREATH *UNUSUAL BRUISING OR BLEEDING *URINARY PROBLEMS (pain or burning when urinating, or frequent urination) *BOWEL PROBLEMS (unusual diarrhea, constipation, pain near the anus) TENDERNESS IN MOUTH AND THROAT WITH OR WITHOUT PRESENCE OF ULCERS (sore throat, sores in mouth, or a toothache) UNUSUAL RASH, SWELLING OR PAIN  UNUSUAL VAGINAL DISCHARGE OR ITCHING   Items with * indicate a potential emergency and should be followed up as soon as possible or go to the Emergency Department if any problems should occur.  Please show the CHEMOTHERAPY ALERT CARD or  IMMUNOTHERAPY ALERT CARD at check-in to the Emergency Department and triage nurse.  Should you have questions after your visit or need to cancel or reschedule your appointment, please contact [facility]  Dept: [phone]  and follow the prompts.  Office hours are 8:00 a.m. to 4:30 p.m. Monday - Friday. Please note that voicemails left after 4:00 p.m. may not be returned until the following business day.  We are closed weekends and major holidays. You have access to a nurse at all times for urgent questions. Please call the main number to the clinic Dept: [phone] and follow the prompts.   For any non-urgent questions, you may also contact your provider using MyChart. We now offer e-Visits for anyone 56 and older to request care online for non-urgent symptoms. For details visit [email or web address].   Also download the MyChart app! Go to the app store, search "MyChart", open the app, select [place], and log in with your MyChart username and password.

## 2023-01-09 NOTE — Progress Notes (Signed)
Patient reports she has stopped taking Tamoxifen as soon as she increased to the full dose. She states the night sweats and fatigue were intolerable.  MD notified.  Patient agreeable to restart medication on reduced dose (1/2 pill) until she sees provider next, per MD request.

## 2023-01-26 ENCOUNTER — Encounter: Payer: Self-pay | Admitting: *Deleted

## 2023-01-30 ENCOUNTER — Inpatient Hospital Stay: Payer: Medicare HMO | Attending: Licensed Clinical Social Worker | Admitting: Adult Health

## 2023-01-30 ENCOUNTER — Inpatient Hospital Stay: Payer: Medicare HMO

## 2023-01-30 ENCOUNTER — Encounter: Payer: Self-pay | Admitting: Adult Health

## 2023-01-30 VITALS — BP 128/70 | HR 88 | Temp 98.1°F | Resp 16 | Wt 121.5 lb

## 2023-01-30 DIAGNOSIS — Z79810 Long term (current) use of selective estrogen receptor modulators (SERMs): Secondary | ICD-10-CM | POA: Insufficient documentation

## 2023-01-30 DIAGNOSIS — Z170 Estrogen receptor positive status [ER+]: Secondary | ICD-10-CM | POA: Insufficient documentation

## 2023-01-30 DIAGNOSIS — Z923 Personal history of irradiation: Secondary | ICD-10-CM | POA: Diagnosis not present

## 2023-01-30 DIAGNOSIS — Z90710 Acquired absence of both cervix and uterus: Secondary | ICD-10-CM | POA: Insufficient documentation

## 2023-01-30 DIAGNOSIS — F17210 Nicotine dependence, cigarettes, uncomplicated: Secondary | ICD-10-CM | POA: Insufficient documentation

## 2023-01-30 DIAGNOSIS — M810 Age-related osteoporosis without current pathological fracture: Secondary | ICD-10-CM | POA: Insufficient documentation

## 2023-01-30 DIAGNOSIS — C50412 Malignant neoplasm of upper-outer quadrant of left female breast: Secondary | ICD-10-CM | POA: Diagnosis not present

## 2023-01-30 DIAGNOSIS — Z1731 Human epidermal growth factor receptor 2 positive status: Secondary | ICD-10-CM | POA: Diagnosis not present

## 2023-01-30 DIAGNOSIS — Z5112 Encounter for antineoplastic immunotherapy: Secondary | ICD-10-CM | POA: Diagnosis present

## 2023-01-30 DIAGNOSIS — F32A Depression, unspecified: Secondary | ICD-10-CM | POA: Diagnosis not present

## 2023-01-30 DIAGNOSIS — Z1722 Progesterone receptor negative status: Secondary | ICD-10-CM | POA: Diagnosis not present

## 2023-01-30 DIAGNOSIS — Z800 Family history of malignant neoplasm of digestive organs: Secondary | ICD-10-CM | POA: Diagnosis not present

## 2023-01-30 DIAGNOSIS — R110 Nausea: Secondary | ICD-10-CM | POA: Insufficient documentation

## 2023-01-30 MED ORDER — ACETAMINOPHEN 325 MG PO TABS
650.0000 mg | ORAL_TABLET | Freq: Once | ORAL | Status: AC
  Administered 2023-01-30: 650 mg via ORAL
  Filled 2023-01-30: qty 2

## 2023-01-30 MED ORDER — SODIUM CHLORIDE 0.9 % IV SOLN
6.0000 mg/kg | Freq: Once | INTRAVENOUS | Status: AC
  Administered 2023-01-30: 336 mg via INTRAVENOUS
  Filled 2023-01-30: qty 16

## 2023-01-30 MED ORDER — SODIUM CHLORIDE 0.9 % IV SOLN: Freq: Once | INTRAVENOUS | Status: AC

## 2023-01-30 MED ORDER — DIPHENHYDRAMINE HCL 25 MG PO CAPS
50.0000 mg | ORAL_CAPSULE | Freq: Once | ORAL | Status: AC
  Administered 2023-01-30: 50 mg via ORAL
  Filled 2023-01-30: qty 2

## 2023-01-30 NOTE — Progress Notes (Signed)
 [facility] Cancer Follow up:    Norleen Lynwood ORN, MD [address] [place] [address]   DIAGNOSIS:  Cancer Staging  Malignant neoplasm of upper-outer quadrant of left breast in female, estrogen receptor positive (HCC) Staging form: Breast, AJCC 8th Edition - Clinical stage from 08/27/2022: Stage IA (cT1c, cN0, cM0, G2, ER+, PR-, HER2+) - Signed by Odean Potts, MD on 08/27/2022 Stage prefix: Initial diagnosis Histologic grading system: 3 grade system Laterality: Left Staged by: Pathologist and managing physician Stage used in treatment planning: Yes National guidelines used in treatment planning: Yes Type of national guideline used in treatment planning: NCCN   SUMMARY OF ONCOLOGIC HISTORY: Oncology History  Malignant neoplasm of upper-outer quadrant of left breast in female, estrogen receptor positive (HCC)  08/18/2022 Initial Diagnosis   Screening mammogram detected left breast mass upper central 4 o'clock position: 1.3 cm, axilla negative, biopsy: Grade 2 IDC ER 95% PR 0% Ki67 15%, HER2 3+ positive   08/27/2022 Cancer Staging   Staging form: Breast, AJCC 8th Edition - Clinical stage from 08/27/2022: Stage IA (cT1c, cN0, cM0, G2, ER+, PR-, HER2+) - Signed by Odean Potts, MD on 08/27/2022 Stage prefix: Initial diagnosis Histologic grading system: 3 grade system Laterality: Left Staged by: Pathologist and managing physician Stage used in treatment planning: Yes National guidelines used in treatment planning: Yes Type of national guideline used in treatment planning: NCCN   09/18/2022 Surgery   09/18/2022: Left lumpectomy: Grade 2 IDC 1.2 cm with DCIS intermediate grade, margins negative, LVI not present, ER 100%, PR 0%, HER2 positive by FISH ratio 3, copy #4.95, Ki-67 10%    11/07/2022 -  Chemotherapy   Patient is on Treatment Plan : BREAST MAINTENANCE Trastuzumab  IV (6) or SQ (600) D1 q21d x 13 cycles     11/10/2022 - 11/14/2022 Radiation Therapy   Plan  Name: Breast_L Site: Breast, Left Technique: 3D Mode: Photon Dose Per Fraction: 5.2 Gy Prescribed Dose (Delivered / Prescribed): 26 Gy / 26 Gy Prescribed Fxs (Delivered / Prescribed): 5 / 5     CURRENT THERAPY: Herceptin  & Tamoxifen   INTERVAL HISTORY:  Discussed the use of AI scribe software for clinical note transcription with the patient, who gave verbal consent to proceed.  [name] 78 y.o. female with HER2-positive breast cancer currently on Herceptin  infusions and tamoxifen , presents for f/u prior to her next Herceptin  treatment.  She has concerns about oral surgery due to a problematic dental implant. She is currently using a medicated mouthwash twice daily to manage the issue. She also reports experiencing hot flashes and leg cramps, which she attributes to tamoxifen  use. She reduced her tamoxifen  dosage to 10 mg daily and prefers to stay at this dose for the time being..  Additionally, the patient reports post-infusion nausea, which typically occurs in the late afternoon. She has been managing this with medication, but notes that it causes constipation. Despite these side effects, the patient reports feeling better overall in the past week. She is scheduled for another Herceptin  infusion and is in the process of transferring care to the [facility].   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   Laryngopharyngeal reflux 07/29/2022   Periorbital hematoma 07/29/2022   Stable angina (HCC) 10/23/2021   Eustachian tube dysfunction, left 09/13/2021   Vertigo 09/13/2021  Constipation 09/13/2021   Allergic rhinitis 04/08/2021   Anxiety 06/27/2020   Hypertensive disorder 06/27/2020   Coronary artery disease involving native coronary artery of native heart with angina pectoris  (HCC) 04/15/2020   Atherosclerosis of aorta (HCC) 04/15/2020   Coronary artery calcification seen on CT scan 08/16/2019   Hyperglycemia 08/12/2018   Degenerative arthritis of right knee 08/24/2017   Herpes zoster without complication 12/26/2016   Pulmonary nodules 01/15/2016   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   Adverse food reaction 01/15/2016   CAP (community acquired pneumonia) 11/28/2015   COPD exacerbation (HCC) 11/28/2015   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/28/2015   Dysuria 07/04/2015   Dry mouth 12/28/2014   Bilateral hearing loss 12/20/2013   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   OAB (overactive bladder) 10/07/2011   Encounter for well adult exam with abnormal findings 10/07/2011   Smoker 10/07/2011   Right knee pain 08/15/2011   Chronic insomnia 08/15/2011   Vitamin D  deficiency 04/30/2010   Hyperlipidemia LDL goal <70 01/08/2010   Other specified forms of hearing loss 01/08/2010   COPD (chronic obstructive pulmonary disease) (HCC) 01/08/2010   Osteoporosis 01/08/2010   Fatigue 01/08/2010   Depression 12/23/2006   LOW BACK PAIN 12/23/2006    is allergic to codeine, lortab [hydrocodone -acetaminophen ], and penicillins.  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   COPD 01/08/2010   DEPRESSIVE DISORDER 12/23/2006   Husband passed   FATIGUE 01/08/2010   HYPERLIPIDEMIA 01/08/2010   Hypertension    LOW BACK PAIN 12/23/2006   OAB (overactive bladder) 10/07/2011   OSTEOPOROSIS 01/08/2010   Other specified forms of hearing loss 01/08/2010   PONV (postoperative nausea and vomiting)    SINUSITIS- ACUTE-NOS 07/09/2007   Urticaria    Vitamin D  deficiency 04/30/2010    SURGICAL HISTORY: Past Surgical History:  Procedure Laterality Date   ABDOMINAL HYSTERECTOMY     APPENDECTOMY  1988   BREAST BIOPSY  1986   BREAST BIOPSY Left 08/18/2022   US  LT BREAST BX W  LOC DEV 1ST LESION IMG BX SPEC US  GUIDE 08/18/2022 [facility]   BREAST BIOPSY  09/17/2022   MM LT RADIOACTIVE SEED LOC MAMMO GUIDE 09/17/2022 [facility]MO[facility]   BREAST LUMPECTOMY WITH RADIOACTIVE SEED LOCALIZATION Left 09/18/2022   Procedure: LEFT BREAST SEED LUMPECTOMY;  Surgeon: Vanderbilt Ned, MD;  Location: [facility];  Service: General;  Laterality: Left;   COLON RESECTION N/A 06/03/2013   Procedure: LAPAROSCOPY DIAGNOSTIC and OPEN  SIGMOID COLON RESECTION ;  Surgeon: Lynda Leos, MD;  Location: [facility];  Service: General;  Laterality: N/A;   COLON SURGERY  2000   colon obstruction   larygenscopy     OOPHORECTOMY  bilat 1988   secondary to cyst    SOCIAL HISTORY: Social History   Socioeconomic History   Marital status: Widowed    Spouse name: Not on file   Number of children: 3   Years of education: Not on file   Highest education level: Not on file  Occupational History   Occupation: Retired    Associate Professor: [employer]  Tobacco Use   Smoking status: Every Day    Current packs/day: 1.00    Average packs/day: 1 pack/day for 44.0 years (44.0 ttl pk-yrs)    Types: Cigarettes   Smokeless tobacco: Never   Tobacco comments:    smoking more due to move/ trying to quit  Vaping Use  Vaping status: Never Used  Substance and Sexual Activity   Alcohol use: Yes    Alcohol/week: 7.0 standard drinks of alcohol    Types: 7 Shots of liquor per week    Comment: vodka nightly   Drug use: No   Sexual activity: Not Currently  Other Topics Concern   Not on file  Social History Narrative   Are you right handed or left handed? Right   Are you currently employed ? N    What is your current occupation?   Do you live at home alone? Y   Who lives with you?    What type of home do you live in: 1 story or 2 story? 1       Social Drivers of Corporate Investment Banker Strain: Low Risk  (09/02/2022)   Overall Financial Resource Strain (CARDIA)    Difficulty  of Paying Living Expenses: Not hard at all  Food Insecurity: No Food Insecurity (09/02/2022)   Hunger Vital Sign    Worried About Running Out of Food in the Last Year: Never true    Ran Out of Food in the Last Year: Never true  Transportation Needs: No Transportation Needs (08/28/2022)   PRAPARE - Administrator, Civil Service (Medical): No    Lack of Transportation (Non-Medical): No  Physical Activity: Insufficiently Active (08/20/2021)   Exercise Vital Sign    Days of Exercise per Week: 1 day    Minutes of Exercise per Session: 120 min  Stress: No Stress Concern Present (08/20/2021)   [employer] of Occupational Health - Occupational Stress Questionnaire    Feeling of Stress : Only a little  Social Connections: Moderately Isolated (08/20/2021)   Social Connection and Isolation Panel [NHANES]    Frequency of Communication with Friends and Family: More than three times a week    Frequency of Social Gatherings with Friends and Family: Three times a week    Attends Religious Services: More than 4 times per year    Active Member of Clubs or Organizations: No    Attends Banker Meetings: Never    Marital Status: Widowed  Intimate Partner Violence: Not At Risk (08/28/2022)   Humiliation, Afraid, Rape, and Kick questionnaire    Fear of Current or Ex-Partner: No    Emotionally Abused: No    Physically Abused: No    Sexually Abused: No    FAMILY HISTORY: Family History  Problem Relation Age of Onset   Colon cancer Mother    Migraines Father    Colon cancer Brother    Diabetes Maternal Aunt    Diabetes Maternal Grandmother    Allergic rhinitis Neg Hx    Angioedema Neg Hx    Asthma Neg Hx    Eczema Neg Hx    Immunodeficiency Neg Hx    Urticaria Neg Hx     Review of Systems  Constitutional:  Positive for fatigue. Negative for appetite change, chills, fever and unexpected weight change.  HENT:   Negative for hearing loss, lump/mass and trouble swallowing.    Eyes:  Negative for eye problems and icterus.  Respiratory:  Negative for chest tightness, cough and shortness of breath.   Cardiovascular:  Negative for chest pain, leg swelling and palpitations.  Gastrointestinal:  Negative for abdominal distention, abdominal pain, constipation, diarrhea, nausea and vomiting.  Endocrine: Positive for hot flashes.  Genitourinary:  Negative for difficulty urinating.   Musculoskeletal:  Negative for arthralgias.  Skin:  Negative for itching  and rash.  Neurological:  Negative for dizziness, extremity weakness, headaches and numbness.  Hematological:  Negative for adenopathy. Does not bruise/bleed easily.  Psychiatric/Behavioral:  Negative for depression. The patient is not nervous/anxious.       PHYSICAL EXAMINATION    Vitals:   01/30/23 0929  BP: 128/70  Pulse: 88  Resp: 16  Temp: 98.1 F (36.7 C)  SpO2: 90%    Physical Exam Constitutional:      General: She is not in acute distress.    Appearance: Normal appearance. She is not toxic-appearing.  HENT:     Head: Normocephalic and atraumatic.     Mouth/Throat:     Mouth: Mucous membranes are moist.     Pharynx: Oropharynx is clear. No oropharyngeal exudate or posterior oropharyngeal erythema.  Eyes:     General: No scleral icterus. Cardiovascular:     Rate and Rhythm: Normal rate and regular rhythm.     Pulses: Normal pulses.     Heart sounds: Normal heart sounds.  Pulmonary:     Effort: Pulmonary effort is normal.     Breath sounds: Normal breath sounds.  Abdominal:     General: Abdomen is flat. Bowel sounds are normal. There is no distension.     Palpations: Abdomen is soft.     Tenderness: There is no abdominal tenderness.  Musculoskeletal:        General: No swelling.     Cervical back: Neck supple.  Lymphadenopathy:     Cervical: No cervical adenopathy.  Skin:    General: Skin is warm and dry.     Findings: No rash.  Neurological:     General: No focal deficit present.      Mental Status: She is alert.  Psychiatric:        Mood and Affect: Mood normal.        Behavior: Behavior normal.          ASSESSMENT and THERAPY PLAN:   Malignant neoplasm of upper-outer quadrant of left breast in female, estrogen receptor positive (HCC) 08/18/2022: Screening mammogram detected left breast mass upper central 4 o'clock position: 1.3 cm, axilla negative, biopsy: Grade 2 IDC ER 95% PR 0% Ki67 15%, HER2 3+ positive 09/18/2022: Left lumpectomy: Grade 2 IDC 1.2 cm with DCIS intermediate grade, margins negative, LVI not present, ER 100%, PR 0%, HER2 positive by FISH ratio 3, copy #4.95, Ki-67 10%   Treatment plan: Adjuvant Herceptin  (patient is not a candidate for chemo): We will check echocardiogram and a bone density test Adjuvant radiation therapy 11/10/2022-11/14/2022 Followed by adjuvant antiestrogen therapy with tamoxifen  20 mg x 5 years (due to osteoporosis and inability to take bisphosphonates) ----------------------------------------------------------------------------------------------------------------------------------------------- 01/05/2023: Echo: EF 60 to 65% Bone density 10/20/2022: T-score -2.8: Osteoporosis: Recommended calcium  vitamin D .  Not taking bisphosphonates because of dental issues  Breast Cancer on Herceptin  No new symptoms. Infusion scheduled for 02/20/2023. -Continue Herceptin  infusions as scheduled. -Echo every 3 months  Potential Oral Surgery Discussed potential need for oral surgery due to dental implant issues. -Oral surgery can proceed if necessary, as Herceptin  does not cause immunosuppression. -Continue medicated mouthwash twice daily.  Tamoxifen  Side Effects Experiencing hot flashes and leg cramps. Currently taking half dose due to side effects. -Continue Tamoxifen  at half dose. -Consider changing time of day of administration to potentially alleviate side effects.  Nausea Post-Infusion Experiencing nausea in the afternoon  following Herceptin  infusion. -Continue anti-nausea medication as needed.  Transfer of Care Transfer to [facility] scheduled for 02/23/2023 at  10 AM. -Next infusion to be done here at [facility] on 02/20/2023 at 1:45 PM.   All questions were answered. The patient knows to call the clinic with any problems, questions or concerns. We can certainly see the patient much sooner if necessary.  Total encounter time:30 minutes*in face-to-face visit time, chart review, lab review, care coordination, order entry, and documentation of the encounter time.    Morna Kendall, NP 01/30/23 3:30 PM Medical Oncology and Hematology [facility] [address] [place], [place] [address] Tel. [phone]    Fax. [phone]  *Total Encounter Time as defined by the Centers for Medicare and Medicaid Services includes, in addition to the face-to-face time of a patient visit (documented in the note above) non-face-to-face time: obtaining and reviewing outside history, ordering and reviewing medications, tests or procedures, care coordination (communications with other health care professionals or caregivers) and documentation in the medical record.

## 2023-01-30 NOTE — Patient Instructions (Signed)

## 2023-01-30 NOTE — Assessment & Plan Note (Addendum)
 08/18/2022: Screening mammogram detected left breast mass upper central 4 o'clock position: 1.3 cm, axilla negative, biopsy: Grade 2 IDC ER 95% PR 0% Ki67 15%, HER2 3+ positive 09/18/2022: Left lumpectomy: Grade 2 IDC 1.2 cm with DCIS intermediate grade, margins negative, LVI not present, ER 100%, PR 0%, HER2 positive by FISH ratio 3, copy #4.95, Ki-67 10%   Treatment plan: Adjuvant Herceptin  (patient is not a candidate for chemo): We will check echocardiogram and a bone density test Adjuvant radiation therapy 11/10/2022-11/14/2022 Followed by adjuvant antiestrogen therapy with tamoxifen  20 mg x 5 years (due to osteoporosis and inability to take bisphosphonates) ----------------------------------------------------------------------------------------------------------------------------------------------- 01/05/2023: Echo: EF 60 to 65% Bone density 10/20/2022: T-score -2.8: Osteoporosis: Recommended calcium  vitamin D .  Not taking bisphosphonates because of dental issues  Breast Cancer on Herceptin  No new symptoms. Infusion scheduled for 02/20/2023. -Continue Herceptin  infusions as scheduled. -Echo every 3 months  Potential Oral Surgery Discussed potential need for oral surgery due to dental implant issues. -Oral surgery can proceed if necessary, as Herceptin  does not cause immunosuppression. -Continue medicated mouthwash twice daily.  Tamoxifen  Side Effects Experiencing hot flashes and leg cramps. Currently taking half dose due to side effects. -Continue Tamoxifen  at half dose. -Consider changing time of day of administration to potentially alleviate side effects.  Nausea Post-Infusion Experiencing nausea in the afternoon following Herceptin  infusion. -Continue anti-nausea medication as needed.  Transfer of Care Transfer to [facility] scheduled for 02/23/2023 at 10 AM. -Next infusion to be done here at [facility] on 02/20/2023 at 1:45 PM.

## 2023-02-09 ENCOUNTER — Inpatient Hospital Stay: Payer: Medicare HMO | Admitting: Oncology

## 2023-02-13 ENCOUNTER — Other Ambulatory Visit: Payer: Self-pay | Admitting: Cardiovascular Disease

## 2023-02-13 ENCOUNTER — Encounter: Payer: Self-pay | Admitting: Hematology and Oncology

## 2023-02-20 ENCOUNTER — Inpatient Hospital Stay: Payer: Medicare PPO

## 2023-02-20 ENCOUNTER — Encounter: Payer: Self-pay | Admitting: *Deleted

## 2023-02-20 VITALS — BP 128/63 | HR 97 | Temp 97.7°F | Resp 17

## 2023-02-20 DIAGNOSIS — Z5112 Encounter for antineoplastic immunotherapy: Secondary | ICD-10-CM | POA: Diagnosis not present

## 2023-02-20 DIAGNOSIS — C50412 Malignant neoplasm of upper-outer quadrant of left female breast: Secondary | ICD-10-CM

## 2023-02-20 MED ORDER — SODIUM CHLORIDE 0.9 % IV SOLN: Freq: Once | INTRAVENOUS | Status: AC

## 2023-02-20 MED ORDER — ACETAMINOPHEN 325 MG PO TABS
650.0000 mg | ORAL_TABLET | Freq: Once | ORAL | Status: AC
  Administered 2023-02-20: 650 mg via ORAL
  Filled 2023-02-20: qty 2

## 2023-02-20 MED ORDER — TRASTUZUMAB-ANNS CHEMO 150 MG IV SOLR
6.0000 mg/kg | Freq: Once | INTRAVENOUS | Status: AC
  Administered 2023-02-20: 336 mg via INTRAVENOUS
  Filled 2023-02-20: qty 16

## 2023-02-20 MED ORDER — DIPHENHYDRAMINE HCL 25 MG PO CAPS
50.0000 mg | ORAL_CAPSULE | Freq: Once | ORAL | Status: AC
  Administered 2023-02-20: 50 mg via ORAL
  Filled 2023-02-20: qty 2

## 2023-02-20 NOTE — Patient Instructions (Signed)
[facility] - A DEPT OF [facility]  Discharge Instructions: Thank you for choosing [facility] to provide your oncology and hematology care.   If you have a lab appointment with the Cancer Center, please go directly to the Cancer Center and check in at the registration area.   Wear comfortable clothing and clothing appropriate for easy access to any Portacath or PICC line.   We strive to give you quality time with your provider. You may need to reschedule your appointment if you arrive late (15 or more minutes).  Arriving late affects you and other patients whose appointments are after yours.  Also, if you miss three or more appointments without notifying the office, you may be dismissed from the clinic at the provider's discretion.      For prescription refill requests, have your pharmacy contact our office and allow 72 hours for refills to be completed.    Today you received the following chemotherapy and/or immunotherapy agents Kanjnti      To help prevent nausea and vomiting after your treatment, we encourage you to take your nausea medication as directed.  BELOW ARE SYMPTOMS THAT SHOULD BE REPORTED IMMEDIATELY: *FEVER GREATER THAN 100.4 F (38 C) OR HIGHER *CHILLS OR SWEATING *NAUSEA AND VOMITING THAT IS NOT CONTROLLED WITH YOUR NAUSEA MEDICATION *UNUSUAL SHORTNESS OF BREATH *UNUSUAL BRUISING OR BLEEDING *URINARY PROBLEMS (pain or burning when urinating, or frequent urination) *BOWEL PROBLEMS (unusual diarrhea, constipation, pain near the anus) TENDERNESS IN MOUTH AND THROAT WITH OR WITHOUT PRESENCE OF ULCERS (sore throat, sores in mouth, or a toothache) UNUSUAL RASH, SWELLING OR PAIN  UNUSUAL VAGINAL DISCHARGE OR ITCHING   Items with * indicate a potential emergency and should be followed up as soon as possible or go to the Emergency Department if any problems should occur.  Please show the CHEMOTHERAPY ALERT CARD or IMMUNOTHERAPY  ALERT CARD at check-in to the Emergency Department and triage nurse.  Should you have questions after your visit or need to cancel or reschedule your appointment, please contact [facility] - A DEPT OF [facility]  Dept: [phone]  and follow the prompts.  Office hours are 8:00 a.m. to 4:30 p.m. Monday - Friday. Please note that voicemails left after 4:00 p.m. may not be returned until the following business day.  We are closed weekends and major holidays. You have access to a nurse at all times for urgent questions. Please call the main number to the clinic Dept: [phone] and follow the prompts.   For any non-urgent questions, you may also contact your provider using MyChart. We now offer e-Visits for anyone 48 and older to request care online for non-urgent symptoms. For details visit [email or web address].   Also download the MyChart app! Go to the app store, search "MyChart", open the app, select [place], and log in with your MyChart username and password.

## 2023-02-23 ENCOUNTER — Inpatient Hospital Stay: Payer: Medicare HMO | Admitting: Oncology

## 2023-02-23 VITALS — BP 132/73 | HR 78 | Temp 97.7°F | Resp 18 | Ht 65.0 in | Wt 121.3 lb

## 2023-02-23 DIAGNOSIS — Z170 Estrogen receptor positive status [ER+]: Secondary | ICD-10-CM

## 2023-02-23 DIAGNOSIS — C50412 Malignant neoplasm of upper-outer quadrant of left female breast: Secondary | ICD-10-CM | POA: Diagnosis not present

## 2023-02-23 DIAGNOSIS — M810 Age-related osteoporosis without current pathological fracture: Secondary | ICD-10-CM

## 2023-02-23 DIAGNOSIS — F329 Major depressive disorder, single episode, unspecified: Secondary | ICD-10-CM

## 2023-02-23 DIAGNOSIS — R110 Nausea: Secondary | ICD-10-CM | POA: Diagnosis not present

## 2023-02-23 DIAGNOSIS — Z5112 Encounter for antineoplastic immunotherapy: Secondary | ICD-10-CM | POA: Diagnosis not present

## 2023-02-23 NOTE — Assessment & Plan Note (Signed)
Patient has been feeling low since a diagnosis and starting of treatment.   -Social work referral for psychosocial support.

## 2023-02-23 NOTE — Progress Notes (Signed)
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 female with ER positive, HER2 positive localized breast cancer of left breast presenting to transition care from [facility] to [facility].  Patient is currently on trastuzumab maintenance and tamoxifen 10 mg daily.  Malignant neoplasm of upper-outer quadrant of left breast in female, estrogen receptor positive (HCC) Patient is s/p lumpectomy with a 1.2 cm malignant lesion.  ER positive, HER2 positive.  Patient is currently on trastuzumab 6 mg/kg every 3 weeks maintenance dose along with tamoxifen 10 mg daily [poor tolerability for 20 mg with hot flashes].  Patient was not given chemotherapy adjuvantly for concerns of potential toxicity considering her age. -Patient had nausea, fatigue with previous doses of trastuzumab but has tolerated the recent dose pretty well.  Will continue trastuzumab every 3 weeks for a total of 1 year. -Continue tamoxifen 10 mg daily -Previous echo from 10/17/2022 showed good EF.  Will repeat echo in March that is 6 months from the start of trastuzumab therapy.  Will require 1 more at the end of treatment. -Discussed in detail the risk versus benefits of continuing treatment versus discontinuing treatment at this time.  Our discussion included recurrence  free survival rates, possible complications from trastuzumab therapy.  Patient is considering continuing treatment at this time but if at all it affects her quality of life would like to rediscuss this at that point. -Patient does understand increased risk of venous thromboembolism with tamoxifen.Encouraged patient to stay active, wear compression socks and take breaks to walk during long trips to reduce risk of clots -Plan for mammogram at 1 year mark  Depression Patient has been feeling low since a diagnosis and starting of treatment.   -Social work referral for psychosocial support.  Osteoporosis Patient is a history of osteoporosis and has received denosumab before. -Considered treatment with tamoxifen instead of AI for this reason.  Nausea without vomiting Patient has nausea without vomiting as a complication of treatment. -Continue antinausea medication as needed. -Continue MiraLAX as needed for constipation [side effect of antinausea medication]    The patient understands the plans discussed today and is in agreement with them.  She knows to contact our office if she develops concerns prior to her next appointment.  I provided 45 minutes of face-to-face time during this encounter and > 50% was spent counseling as documented under my assessment and plan.    Cindie Crumbly, MD  [facility] [place] - A DEPT OF [facility] [address] [place]  [place] [address] Dept: [phone] Dept Fax: [phone]   Orders Placed This Encounter  Procedures   Ambulatory referral to Social Work    Referral Priority:   Routine    Referral Type:   Consultation    Referral Reason:   Specialty Services Required    Number  of Visits Requested:   1   ECHOCARDIOGRAM COMPLETE    Standing Status:   Future    Expected Date:   04/06/2023    Expiration Date:   02/23/2024    Where should this test be performed:   [facility]    Perflutren DEFINITY  (image enhancing agent) should be administered unless hypersensitivity or allergy exist:   Administer Perflutren    Reason for exam-Echo:   Chemo  Z09    Release to patient:   Immediate     ONCOLOGY HISTORY:   Oncology History  Malignant neoplasm of upper-outer quadrant of left breast in female, estrogen receptor positive (HCC)  08/18/2022 Initial Diagnosis   Screening mammogram detected left breast mass upper central 4 o'clock position: 1.3 cm, axilla negative, biopsy: Grade 2 IDC ER 95% PR 0% Ki67 15%, HER2 3+ positive   08/27/2022 Cancer Staging   Staging form: Breast, AJCC 8th Edition - Clinical stage from 08/27/2022: Stage IA (cT1c, cN0, cM0, G2, ER+, PR-, HER2+) - Signed by Serena Croissant, MD on 08/27/2022 Stage prefix: Initial diagnosis Histologic grading system: 3 grade system Laterality: Left Staged by: Pathologist and managing physician Stage used in treatment planning: Yes National guidelines used in treatment planning: Yes Type of national guideline used in treatment planning: NCCN   09/18/2022 Surgery   09/18/2022: Left lumpectomy: Grade 2 IDC 1.2 cm with DCIS intermediate grade, margins negative, LVI not present, ER 100%, PR 0%, HER2 positive by FISH ratio 3, copy #4.95, Ki-67 10%    09/18/2022 Pathology Results   FINAL MICROSCOPIC DIAGNOSIS:   A. BREAST, LEFT, LUMPECTOMY:       Invasive ductal carcinoma with micropapillary features, 1.2 cm, grade 2  Ductal carcinoma in situ:  Cribriform type, intermediate grade, with calcifications  Margins, invasive:  Negative  Lymphovascular invasion:  Not identified   Prognostic markers:  ER: 95%, positive, strong staining intensity  PR: 0%, negative  Her2: Positive by FISH  Ki-67: 15%    10/17/2022 Imaging   Echo: Left ventricular ejection fraction, by estimation, is 65 to 70%    11/07/2022 -  Chemotherapy   -Tamoxifen 10mg  daily for 5 years. Dose reduced from 20mg  daily for hot flashes. Patient is on Treatment Plan : BREAST  MAINTENANCE Trastuzumab IV (6) or SQ (600) D1 q21d x 13 cycles      11/10/2022 - 11/14/2022 Radiation Therapy   Plan Name: Breast_L Site: Breast, Left Technique: 3D Mode: Photon Dose Per Fraction: 5.2 Gy Prescribed Dose (Delivered / Prescribed): 26 Gy / 26 Gy Prescribed Fxs (Delivered / Prescribed): 5 / 5   01/05/2023 Imaging   Echo: Left ventricular ejection fraction, by estimation, is 60 to 65%.        Current Treatment: Trastuzumab maintenance plus tamoxifen  INTERVAL HISTORY:  DELYNDA SEPULVEDA is here today for follow up. Patient is accompanied by by her daughter-in-law today.  Patient was being followed by Dr. Pamelia Hoit at [facility] and wanted to transition her care to [facility] for care closer to home.  Patient lives in [place], [place].  Patient was diagnosed of ER positive, HER2 positive T1 cN0 M0 left breast carcinoma in July.  She had a lumpectomy and radiation and was started on trastuzumab with tamoxifen.  Patient did not receive chemotherapy due to concerns of potential toxicity considering her age.  Patient stated that she did not initially tolerate trastuzumab very well had fatigue and nausea but did okay with the recent infusion.  Patient is  also currently taking only tamoxifen 10 mg instead of 20 mg due to hot flashes.  She reported experiencing significant side effects from trastuzumab including nausea, fatigue, loss of appetite and general feeling of malaise.  The symptoms have been severe enough to impact her quality of life, leading her to consider discontinuing her treatment.,  The recent infusion did not cause the side effects and patient would like to revisit the discussion if at all the symptoms come back.  She expresses feelings of depression and anxiety, which she attributes to her cancer diagnosis and the impact it had on her life.  Overall, patient has been doing okay.  She has no new complaints.  She denies breast tenderness, new lesions,  discharge from the breast.  Denies fever, chills, abdominal pain.   I have reviewed the past medical history, past surgical history, social history and family history with the patient and they are unchanged from previous note.  ALLERGIES:  is allergic to codeine, lortab [hydrocodone-acetaminophen], and penicillins.  MEDICATIONS:  Current Outpatient Medications  Medication Sig Dispense Refill   Acetaminophen (TYLENOL PO) Take 500 mg by mouth daily as needed.     amLODipine (NORVASC) 5 MG tablet Take 1 tablet (5 mg total) by mouth daily. 90 tablet 3   aspirin EC 81 MG tablet Take by mouth.     chlorhexidine (PERIDEX) 0.12 % solution Use as directed 5 mLs in the mouth or throat 2 (two) times daily.     Cholecalciferol (VITAMIN D3) 50 MCG (2000 UT) TABS Take 50 mcg by mouth daily.     Coenzyme Q10 (COQ10) 100 MG CAPS Take 3 capsules by mouth daily. 30 capsule    diazepam (VALIUM) 2 MG tablet Take 2 mg by mouth 3 (three) times daily as needed.     FLUZONE HIGH-DOSE 0.5 ML injection      levocetirizine (XYZAL) 5 MG tablet Take 5 mg by mouth every evening.     metoprolol succinate (TOPROL-XL) 25 MG 24 hr tablet TAKE 1/2 (ONE-HALF) TABLET BY MOUTH ONCE DAILY WITH OR IMMEDIATELY FOLLOWING A MEAL 45 tablet 3   nitroGLYCERIN (NITROSTAT) 0.4 MG SL tablet DISSOLVE ONE TABLET UNDER THE TONGUE EVERY 5 MINUTES AS NEEDED FOR CHEST PAIN.  DO NOT EXCEED A TOTAL OF 3 DOSES IN 15 MINUTES 25 tablet 3   omeprazole (PRILOSEC) 20 MG capsule Take 20 mg by mouth daily. Takes once every 3 days     ondansetron (ZOFRAN) 8 MG tablet Take 1 tablet (8 mg total) by mouth every 8 (eight) hours as needed for nausea or vomiting. 30 tablet 2   polyethylene glycol (MIRALAX / GLYCOLAX) 17 g packet Take 17 g by mouth daily.     rosuvastatin (CRESTOR) 20 MG tablet Take 1 tablet (20 mg total) by mouth daily. 90 tablet 3   tamoxifen (NOLVADEX) 20 MG tablet Take 1 tablet (20 mg total) by mouth daily. 90 tablet 3   temazepam (RESTORIL)  30 MG capsule Take 1 capsule (30 mg total) by mouth at bedtime as needed for sleep. (Patient taking differently: Take 30 mg by mouth at bedtime.) 30 capsule 5   TRASTUZUMAB IV Inject into the vein every 21 ( twenty-one) days.     Triamcinolone Acetonide (NASACORT ALLERGY 24HR NA) Place 1 spray into the nose daily.     No current facility-administered medications for this visit.    REVIEW OF SYSTEMS:   Constitutional: Denies fevers, chills or abnormal weight loss Eyes: Denies blurriness of vision Ears, nose,  mouth, throat, and face: Denies mucositis or sore throat Respiratory: Denies cough, dyspnea or wheezes Cardiovascular: Denies palpitation, chest discomfort or lower extremity swelling Gastrointestinal:  Denies nausea, heartburn or change in bowel habits Skin: Denies abnormal skin rashes Lymphatics: Denies new lymphadenopathy or easy bruising Neurological:Denies numbness, tingling or new weaknesses Behavioral/Psych: Mood is stable, no new changes  All other systems were reviewed with the patient and are negative.   VITALS:  Blood pressure 132/73, pulse 78, temperature 97.7 F (36.5 C), temperature source Oral, resp. rate 18, height 5\' 5"  (1.651 m), weight 121 lb 4.1 oz (55 kg), SpO2 92%.  Wt Readings from Last 3 Encounters:  02/23/23 121 lb 4.1 oz (55 kg)  01/30/23 121 lb 8 oz (55.1 kg)  01/09/23 124 lb (56.2 kg)    Body mass index is 20.18 kg/m.  Performance status (ECOG): 0 - Asymptomatic  PHYSICAL EXAM:   GENERAL:alert, no distress and comfortable SKIN: skin color, texture, turgor are normal, no rashes or significant lesions LUNGS: clear to auscultation and percussion with normal breathing effort BREAST: No palpable lumps identified, presence of fluid noted in the lumpectomy scar area on the left breast. No axillary lymphadenopathy  HEART: regular rate & rhythm and no murmurs and no lower extremity edema ABDOMEN:abdomen soft, non-tender and normal bowel  sounds Musculoskeletal:no cyanosis of digits and no clubbing  NEURO: alert & oriented x 3 with fluent speech  LABORATORY DATA:  I have reviewed the data as listed    Component Value Date/Time   NA 133 (L) 12/19/2022 1114   NA 136 10/14/2019 1155   K 3.8 12/19/2022 1114   CL 98 12/19/2022 1114   CO2 28 12/19/2022 1114   GLUCOSE 104 (H) 12/19/2022 1114   BUN 13 12/19/2022 1114   BUN 13 10/14/2019 1155   CREATININE 0.59 12/19/2022 1114   CREATININE 0.71 08/16/2019 1518   CALCIUM 9.3 12/19/2022 1114   CALCIUM 9.7 10/07/2011 0919   PROT 7.9 12/19/2022 1114   PROT 7.3 01/24/2020 0816   ALBUMIN 4.5 12/19/2022 1114   ALBUMIN 4.5 01/24/2020 0816   AST 18 12/19/2022 1114   ALT 12 12/19/2022 1114   ALKPHOS 60 12/19/2022 1114   BILITOT 0.4 12/19/2022 1114   GFRNONAA >60 12/19/2022 1114   GFRNONAA 83 08/16/2019 1518   GFRAA 95 10/14/2019 1155   GFRAA 97 08/16/2019 1518    Lab Results  Component Value Date   WBC 7.0 12/19/2022   NEUTROABS 5.2 12/19/2022   HGB 14.5 12/19/2022   HCT 44.4 12/19/2022   MCV 95.5 12/19/2022   PLT 338 12/19/2022      Chemistry      Component Value Date/Time   NA 133 (L) 12/19/2022 1114   NA 136 10/14/2019 1155   K 3.8 12/19/2022 1114   CL 98 12/19/2022 1114   CO2 28 12/19/2022 1114   BUN 13 12/19/2022 1114   BUN 13 10/14/2019 1155   CREATININE 0.59 12/19/2022 1114   CREATININE 0.71 08/16/2019 1518      Component Value Date/Time   CALCIUM 9.3 12/19/2022 1114   CALCIUM 9.7 10/07/2011 0919   ALKPHOS 60 12/19/2022 1114   AST 18 12/19/2022 1114   ALT 12 12/19/2022 1114   BILITOT 0.4 12/19/2022 1114

## 2023-02-23 NOTE — Patient Instructions (Addendum)
[facility]  Discharge Instructions  You were seen and examined today by Dr. Anders Simmonds. Dr. Anders Simmonds is a medical oncologist, meaning that he specializes in the treatment of cancer diagnoses. Dr. Anders Simmonds discussed your past medical history, family history of cancers, and the events that led to you being here today.  You were referred to Dr. Anders Simmonds for ongoing management of your breast cancer.  Continue taking Tamoxifen as prescribed.  Ideally, you will continue the infusion that targets the HER2+ receptor the cancer until October to complete one year of treatment.  The reason for treatment is to prevent the cancer from returning after surgery.  Your next infusion will be due on February 14th.  Should you choose to discontinue the infusion, you would likely on average have a less than 40% chance of cancer recurrence in the next 5 years.  Follow-up as scheduled.  Thank you for choosing [facility] to provide your oncology and hematology care.   To afford each patient quality time with our provider, please arrive at least 15 minutes before your scheduled appointment time. You may need to reschedule your appointment if you arrive late (10 or more minutes). Arriving late affects you and other patients whose appointments are after yours.  Also, if you miss three or more appointments without notifying the office, you may be dismissed from the clinic at the provider's discretion.    Again, thank you for choosing [facility].  Our hope is that these requests will decrease the amount of time that you wait before being seen by our physicians.   If you have a lab appointment with the Cancer Center - please note that after April 8th, all labs will be drawn in the cancer center.  You do not have to check in or register with the main entrance as you have in the past but will complete your check-in at the cancer center.             _____________________________________________________________  Should you have questions after your visit to [facility], please contact our office at [phone] and follow the prompts.  Our office hours are 8:00 a.m. to 4:30 p.m. Monday - Thursday and 8:00 a.m. to 2:30 p.m. Friday.  Please note that voicemails left after 4:00 p.m. may not be returned until the following business day.  We are closed weekends and all major holidays.  You do have access to a nurse 24-7, just call the main number to the clinic [phone] and do not press any options, hold on the line and a nurse will answer the phone.    For prescription refill requests, have your pharmacy contact our office and allow 72 hours.    Masks are no longer required in the cancer centers. If you would like for your care team to wear a mask while they are taking care of you, please let them know. You may have one support person who is at least 80 years old accompany you for your appointments.

## 2023-02-23 NOTE — Assessment & Plan Note (Addendum)
Patient is s/p lumpectomy with a 1.2 cm malignant lesion.  ER positive, HER2 positive.  Patient is currently on trastuzumab 6 mg/kg every 3 weeks maintenance dose along with tamoxifen 10 mg daily [poor tolerability for 20 mg with hot flashes].  Patient was not given chemotherapy adjuvantly for concerns of potential toxicity considering her age. -Patient had nausea, fatigue with previous doses of trastuzumab but has tolerated the recent dose pretty well.  Will continue trastuzumab every 3 weeks for a total of 1 year. -Continue tamoxifen 10 mg daily -Previous echo from 10/17/2022 showed good EF.  Will repeat echo in March that is 6 months from the start of trastuzumab therapy.  Will require 1 more at the end of treatment. -Discussed in detail the risk versus benefits of continuing treatment versus discontinuing treatment at this time.  Our discussion included recurrence free survival rates, possible complications from trastuzumab therapy.  Patient is considering continuing treatment at this time but if at all it affects her quality of life would like to rediscuss this at that point. -Patient does understand increased risk of venous thromboembolism with tamoxifen.Encouraged patient to stay active, wear compression socks and take breaks to walk during long trips to reduce risk of clots -Plan for mammogram at 1 year mark

## 2023-02-23 NOTE — Assessment & Plan Note (Signed)
Patient has nausea without vomiting as a complication of treatment. -Continue antinausea medication as needed. -Continue MiraLAX as needed for constipation [side effect of antinausea medication]

## 2023-02-23 NOTE — Assessment & Plan Note (Signed)
Patient is a history of osteoporosis and has received denosumab before. -Considered treatment with tamoxifen instead of AI for this reason.

## 2023-03-04 ENCOUNTER — Other Ambulatory Visit: Payer: Self-pay | Admitting: Oncology

## 2023-03-04 ENCOUNTER — Inpatient Hospital Stay: Payer: Medicare HMO | Attending: Licensed Clinical Social Worker | Admitting: Licensed Clinical Social Worker

## 2023-03-04 DIAGNOSIS — C50412 Malignant neoplasm of upper-outer quadrant of left female breast: Secondary | ICD-10-CM | POA: Insufficient documentation

## 2023-03-04 DIAGNOSIS — F32A Depression, unspecified: Secondary | ICD-10-CM | POA: Insufficient documentation

## 2023-03-04 DIAGNOSIS — Z1731 Human epidermal growth factor receptor 2 positive status: Secondary | ICD-10-CM | POA: Insufficient documentation

## 2023-03-04 DIAGNOSIS — Z5112 Encounter for antineoplastic immunotherapy: Secondary | ICD-10-CM | POA: Insufficient documentation

## 2023-03-04 DIAGNOSIS — Z170 Estrogen receptor positive status [ER+]: Secondary | ICD-10-CM | POA: Insufficient documentation

## 2023-03-04 DIAGNOSIS — Z79810 Long term (current) use of selective estrogen receptor modulators (SERMs): Secondary | ICD-10-CM | POA: Insufficient documentation

## 2023-03-04 DIAGNOSIS — Z79899 Other long term (current) drug therapy: Secondary | ICD-10-CM | POA: Insufficient documentation

## 2023-03-04 DIAGNOSIS — Z1722 Progesterone receptor negative status: Secondary | ICD-10-CM | POA: Insufficient documentation

## 2023-03-04 MED ORDER — CITALOPRAM HYDROBROMIDE 10 MG PO TABS: 10.0000 mg | ORAL_TABLET | Freq: Every day | ORAL | 0 refills | Status: DC

## 2023-03-04 NOTE — Progress Notes (Signed)
 [facility] Clinical Social Work  Initial Assessment   Michelle Hubbard is a 78 y.o. year old female contacted by phone. Clinical Social Work was referred by medical provider for assessment of psychosocial needs.   SDOH (Social Determinants of Health) assessments performed: Yes SDOH Interventions    Flowsheet Row Clinical Support from 09/02/2022 in [facility] Clinical Support from 08/20/2021 in [facility] Clinical Support from 08/16/2019 in [facility] Clinical Support from 08/12/2018 in [facility]  SDOH Interventions      Food Insecurity Interventions Intervention Not Indicated Intervention Not Indicated Intervention Not Indicated --  Housing Interventions -- Intervention Not Indicated Intervention Not Indicated --  Transportation Interventions Intervention Not Indicated Intervention Not Indicated Intervention Not Indicated --  Depression Interventions/Treatment  -- -- -- Medication  Financial Strain Interventions Intervention Not Indicated Intervention Not Indicated Intervention Not Indicated --  Physical Activity Interventions -- Intervention Not Indicated Intervention Not Indicated --  Stress Interventions -- Intervention Not Indicated Intervention Not Indicated --  Social Connections Interventions -- Intervention Not Indicated Intervention Not Indicated --  Health Literacy Interventions Intervention Not Indicated -- -- --       SDOH Screenings   Food Insecurity: No Food Insecurity (09/02/2022)  Housing: Low Risk  (09/02/2022)  Transportation Needs: No Transportation Needs (08/28/2022)  Utilities: Not At Risk (09/02/2022)  Alcohol Screen: Low Risk  (08/20/2021)  Depression (PHQ2-9): Low Risk  (10/17/2022)  Recent Concern: Depression (PHQ2-9) - Medium Risk (09/02/2022)  Financial Resource Strain: Low Risk  (09/02/2022)  Physical Activity: Insufficiently Active (08/20/2021)   Social Connections: Moderately Isolated (08/20/2021)  Stress: No Stress Concern Present (08/20/2021)  Tobacco Use: High Risk (01/30/2023)  Health Literacy: Adequate Health Literacy (09/02/2022)     Distress Screen completed: Yes    12/18/2022   11:00 AM  ONCBCN DISTRESS SCREENING  Distress experienced in past week (1-10) 5  Emotional problem type Adjusting to illness;Nervousness/Anxiety      Family/Social Information:  Housing Arrangement: patient lives alone.  Pt has been a widow for 15 years.  She is independent in ADLs.  Pt started treatment at [facility] and is transitioning her maintenance therapy to [facility] in order to be closer to home.  Family members/support persons in your life? Pt states her sister and a close friend have been her primary source of support while undergoing treatment. Transportation concerns: no  Employment: Retired .  Income source: Actor concerns: No Type of concern: None Food access concerns: no Religious or spiritual practice: Yes-[employer] Advanced directives: No Services Currently in place:  none  Coping/ Adjustment to diagnosis: Patient understands treatment plan and what happens next? yes Concerns about diagnosis and/or treatment: Overwhelmed by information, Afraid of cancer, and Quality of life Patient reported stressors: Depression, Anxiety/ nervousness, and Adjusting to my illness Hopes and/or priorities: Pt's priority is to continue maintenance therapy provided her quality of life is not impacted w/ the hope of positive results. Patient enjoys time with family/ friends Current coping skills/ strengths: Capable of independent living , Motivation for treatment/growth , Physical Health , and Supportive family/friends     SUMMARY: Current SDOH Barriers:  No barriers identified  Clinical Social Work Clinical Goal(s):  No clinical social work goals at this time  Interventions: Discussed common feeling and  emotions when being diagnosed with cancer, and the importance of support during treatment Informed patient of the support team roles  and support services at [facility] Provided CSW contact information and encouraged patient to call with any questions or concerns Provided pt w/ a link to order the book Dancing in Limbo as pt reports she is experiencing significant anxiety and depression since completing treatment.  Pt reports she has taken Celexa  in the past which was beneficial and inquired if Dr. Davonna would be willing to prescribe the medication to assist w/ situational depression.  CSW spoke w/ Dr. Davonna who agreed to prescribe a 30 day supply which would need to be filled by pt's PCP moving forward.  Pt informed prescription has been sent to the pharmacy.   Follow Up Plan: Patient will contact CSW with any support or resource needs Patient verbalizes understanding of plan: Yes    Devere JONELLE Manna, LCSW Clinical Social Worker [facility]

## 2023-03-12 ENCOUNTER — Other Ambulatory Visit: Payer: Self-pay

## 2023-03-12 DIAGNOSIS — Z170 Estrogen receptor positive status [ER+]: Secondary | ICD-10-CM

## 2023-03-13 ENCOUNTER — Ambulatory Visit: Payer: Medicare PPO | Admitting: Adult Health

## 2023-03-13 ENCOUNTER — Ambulatory Visit: Payer: Medicare PPO

## 2023-03-13 ENCOUNTER — Inpatient Hospital Stay: Payer: Medicare HMO | Admitting: Oncology

## 2023-03-13 ENCOUNTER — Inpatient Hospital Stay: Payer: Medicare HMO

## 2023-03-13 VITALS — BP 124/81 | HR 80 | Temp 98.0°F | Resp 18 | Ht 65.0 in | Wt 122.2 lb

## 2023-03-13 VITALS — BP 121/62 | HR 80 | Temp 97.9°F | Resp 18

## 2023-03-13 DIAGNOSIS — F329 Major depressive disorder, single episode, unspecified: Secondary | ICD-10-CM | POA: Diagnosis not present

## 2023-03-13 DIAGNOSIS — Z79899 Other long term (current) drug therapy: Secondary | ICD-10-CM | POA: Diagnosis not present

## 2023-03-13 DIAGNOSIS — Z170 Estrogen receptor positive status [ER+]: Secondary | ICD-10-CM | POA: Diagnosis not present

## 2023-03-13 DIAGNOSIS — Z79810 Long term (current) use of selective estrogen receptor modulators (SERMs): Secondary | ICD-10-CM | POA: Diagnosis not present

## 2023-03-13 DIAGNOSIS — Z1731 Human epidermal growth factor receptor 2 positive status: Secondary | ICD-10-CM | POA: Diagnosis not present

## 2023-03-13 DIAGNOSIS — Z5112 Encounter for antineoplastic immunotherapy: Secondary | ICD-10-CM | POA: Diagnosis present

## 2023-03-13 DIAGNOSIS — C50412 Malignant neoplasm of upper-outer quadrant of left female breast: Secondary | ICD-10-CM

## 2023-03-13 DIAGNOSIS — Z1722 Progesterone receptor negative status: Secondary | ICD-10-CM | POA: Diagnosis not present

## 2023-03-13 DIAGNOSIS — F32A Depression, unspecified: Secondary | ICD-10-CM | POA: Diagnosis not present

## 2023-03-13 LAB — CBC WITH DIFFERENTIAL/PLATELET
Abs Immature Granulocytes: 0.02 10*3/uL (ref 0.00–0.07)
Basophils Absolute: 0 10*3/uL (ref 0.0–0.1)
Basophils Relative: 1 %
Eosinophils Absolute: 0.1 10*3/uL (ref 0.0–0.5)
Eosinophils Relative: 1 %
HCT: 44.1 % (ref 36.0–46.0)
Hemoglobin: 14.2 g/dL (ref 12.0–15.0)
Immature Granulocytes: 0 %
Lymphocytes Relative: 17 %
Lymphs Abs: 0.9 10*3/uL (ref 0.7–4.0)
MCH: 30.5 pg (ref 26.0–34.0)
MCHC: 32.2 g/dL (ref 30.0–36.0)
MCV: 94.8 fL (ref 80.0–100.0)
Monocytes Absolute: 0.8 10*3/uL (ref 0.1–1.0)
Monocytes Relative: 15 %
Neutro Abs: 3.4 10*3/uL (ref 1.7–7.7)
Neutrophils Relative %: 66 %
Platelets: 313 10*3/uL (ref 150–400)
RBC: 4.65 MIL/uL (ref 3.87–5.11)
RDW: 13.3 % (ref 11.5–15.5)
WBC: 5.2 10*3/uL (ref 4.0–10.5)
nRBC: 0 % (ref 0.0–0.2)

## 2023-03-13 LAB — COMPREHENSIVE METABOLIC PANEL
ALT: 26 U/L (ref 0–44)
AST: 34 U/L (ref 15–41)
Albumin: 4.2 g/dL (ref 3.5–5.0)
Alkaline Phosphatase: 42 U/L (ref 38–126)
Anion gap: 10 (ref 5–15)
BUN: 11 mg/dL (ref 8–23)
CO2: 26 mmol/L (ref 22–32)
Calcium: 8.6 mg/dL — ABNORMAL LOW (ref 8.9–10.3)
Chloride: 95 mmol/L — ABNORMAL LOW (ref 98–111)
Creatinine, Ser: 0.55 mg/dL (ref 0.44–1.00)
GFR, Estimated: >60 mL/min (ref >60)
Glucose, Bld: 89 mg/dL (ref 70–99)
Potassium: 3.6 mmol/L (ref 3.5–5.1)
Sodium: 131 mmol/L — ABNORMAL LOW (ref 135–145)
Total Bilirubin: 0.5 mg/dL (ref 0.0–1.2)
Total Protein: 7.6 g/dL (ref 6.5–8.1)

## 2023-03-13 LAB — MAGNESIUM: Magnesium: 2 mg/dL (ref 1.7–2.4)

## 2023-03-13 MED ORDER — SODIUM CHLORIDE 0.9 % IV SOLN
Freq: Once | INTRAVENOUS | Status: AC
Start: 2023-03-13 — End: 2023-03-13

## 2023-03-13 MED ORDER — DIPHENHYDRAMINE HCL 25 MG PO CAPS
25.0000 mg | ORAL_CAPSULE | Freq: Once | ORAL | Status: AC
  Administered 2023-03-13: 25 mg via ORAL
  Filled 2023-03-13: qty 1

## 2023-03-13 MED ORDER — ACETAMINOPHEN 325 MG PO TABS
650.0000 mg | ORAL_TABLET | Freq: Once | ORAL | Status: AC
  Administered 2023-03-13: 650 mg via ORAL
  Filled 2023-03-13: qty 2

## 2023-03-13 MED ORDER — TRASTUZUMAB-ANNS CHEMO 150 MG IV SOLR
6.0000 mg/kg | Freq: Once | INTRAVENOUS | Status: AC
  Administered 2023-03-13: 336 mg via INTRAVENOUS
  Filled 2023-03-13: qty 16

## 2023-03-13 NOTE — Progress Notes (Signed)
Decrease dose of diphenhydramine to 25 mg po x 1 prior to trastuzumab.  Orders updated.  T.O. Dr Lysle Morales, PharmD

## 2023-03-13 NOTE — Progress Notes (Signed)
Patient tolerated chemotherapy with no complaints voiced.  Side effects with management reviewed with understanding verbalized.  IV site clean and dry with no bruising or swelling noted at site.  Good blood return noted before and after administration of chemotherapy.  Band aid applied.  Patient left in satisfactory condition with VSS and no s/s of distress noted. All follow ups as scheduled.   Michelle Hubbard [employer]

## 2023-03-13 NOTE — Patient Instructions (Signed)
[facility] [place] - [facility]  Discharge Instructions: Thank you for choosing [facility] to provide your oncology and hematology care.  If you have a lab appointment with the Cancer Center - please note that after April 8th, 2024, all labs will be drawn in the cancer center.  You do not have to check in or register with the main entrance as you have in the past but will complete your check-in in the cancer center.  Wear comfortable clothing and clothing appropriate for easy access to any Portacath or PICC line.   We strive to give you quality time with your provider. You may need to reschedule your appointment if you arrive late (15 or more minutes).  Arriving late affects you and other patients whose appointments are after yours.  Also, if you miss three or more appointments without notifying the office, you may be dismissed from the clinic at the provider's discretion.      For prescription refill requests, have your pharmacy contact our office and allow 72 hours for refills to be completed.    Today you received the following chemotherapy and/or immunotherapy agents kanjinti      To help prevent nausea and vomiting after your treatment, we encourage you to take your nausea medication as directed.  BELOW ARE SYMPTOMS THAT SHOULD BE REPORTED IMMEDIATELY: *FEVER GREATER THAN 100.4 F (38 C) OR HIGHER *CHILLS OR SWEATING *NAUSEA AND VOMITING THAT IS NOT CONTROLLED WITH YOUR NAUSEA MEDICATION *UNUSUAL SHORTNESS OF BREATH *UNUSUAL BRUISING OR BLEEDING *URINARY PROBLEMS (pain or burning when urinating, or frequent urination) *BOWEL PROBLEMS (unusual diarrhea, constipation, pain near the anus) TENDERNESS IN MOUTH AND THROAT WITH OR WITHOUT PRESENCE OF ULCERS (sore throat, sores in mouth, or a toothache) UNUSUAL RASH, SWELLING OR PAIN  UNUSUAL VAGINAL DISCHARGE OR ITCHING   Items with * indicate a potential emergency and should be followed up  as soon as possible or go to the Emergency Department if any problems should occur.  Please show the CHEMOTHERAPY ALERT CARD or IMMUNOTHERAPY ALERT CARD at check-in to the Emergency Department and triage nurse.  Should you have questions after your visit or need to cancel or reschedule your appointment, please contact [facility] CAN[facility]ER CTR  - A DEPT OF Eligha Bridegroom [facility] [phone]  and follow the prompts.  Office hours are 8:00 a.m. to 4:30 p.m. Monday - Friday. Please note that voicemails left after 4:00 p.m. may not be returned until the following business day.  We are closed weekends and major holidays. You have access to a nurse at all times for urgent questions. Please call the main number to the clinic [phone] and follow the prompts.  For any non-urgent questions, you may also contact your provider using MyChart. We now offer e-Visits for anyone 28 and older to request care online for non-urgent symptoms. For details visit [email or web address].   Also download the MyChart app! Go to the app store, search "MyChart", open the app, select [place], and log in with your MyChart username and password.

## 2023-03-13 NOTE — Progress Notes (Signed)
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   Malignant neoplasm of upper-outer quadrant of left breast in female, estrogen receptor positive (HCC) Patient is s/p lumpectomy with a 1.2 cm malignant lesion.  ER positive, HER2 positive.  Patient is currently on trastuzumab 6 mg/kg every 3 weeks maintenance dose along with tamoxifen 10 mg daily [poor tolerability for 20 mg with hot flashes].  Patient was not given chemotherapy adjuvantly for concerns of potential toxicity considering her age. -Patient had nausea, fatigue with previous doses of trastuzumab but has tolerated the recent dose pretty well.  Will continue trastuzumab every 3 weeks for a total of 1 year. -Continue tamoxifen 10 mg daily -Previous echo from 10/17/2022 showed good EF.  Will repeat echo in March that is 6 months from the start of trastuzumab therapy.  Will require 1 more at the end of treatment. -Plan for mammogram at 1 year mark  Labs and exam stable today.  Continue with treatment today.  Will space out the labs to every other treatment. Return to clinic in 6 weeks.  Depression Patient has been feeling low since a  diagnosis and starting of treatment.  Social worker helping with psychosocial support. -Continue the prescribed Celexa.  -Discuss with the primary care physician    The patient understands the plans discussed today and is in agreement with them.  She knows to contact our office if she develops concerns prior to her next appointment.  I provided 20 minutes of face-to-face time during this encounter and > 50% was spent counseling as documented under my assessment and plan.    Cindie Crumbly, MD  [place] [facility] [place] - [facility] [address] [place] [address] Dept: [phone] Dept Fax: [phone]    ONCOLOGY HISTORY:   Oncology History  Malignant neoplasm of upper-outer quadrant of left breast in female, estrogen receptor positive (HCC)  08/18/2022 Initial Diagnosis   Screening mammogram detected left breast mass upper central 4 o'clock position: 1.3 cm, axilla negative, biopsy: Grade 2 IDC ER 95% PR 0% Ki67 15%, HER2 3+ positive   08/27/2022 Cancer Staging   Staging form: Breast, AJCC 8th Edition - Clinical stage from 08/27/2022: Stage IA (cT1c, cN0, cM0, G2, ER+, PR-, HER2+) - Signed by Serena Croissant, MD on 08/27/2022 Stage prefix: Initial diagnosis Histologic grading system: 3 grade system Laterality: Left Staged by: Pathologist and managing physician Stage used in treatment planning: Yes National guidelines used in treatment planning: Yes Type of national guideline used in treatment planning: NCCN   09/18/2022 Surgery   09/18/2022: Left lumpectomy: Grade 2 IDC 1.2 cm with DCIS intermediate grade, margins negative, LVI not present, ER 100%, PR 0%,  HER2 positive by FISH ratio 3, copy #4.95, Ki-67 10%    09/18/2022 Pathology Results   FINAL MICROSCOPIC DIAGNOSIS:   A. BREAST, LEFT, LUMPECTOMY:       Invasive ductal carcinoma with micropapillary features, 1.2 cm, grade 2  Ductal carcinoma in situ:   Cribriform type, intermediate grade, with calcifications  Margins, invasive:  Negative  Lymphovascular invasion:  Not identified   Prognostic markers:  ER: 95%, positive, strong staining intensity  PR: 0%, negative  Her2: Positive by FISH  Ki-67: 15%    10/17/2022 Imaging   Echo: Left ventricular ejection fraction, by estimation, is 65 to 70%    11/07/2022 -  Chemotherapy   -Tamoxifen 10mg  daily for 5 years. Dose reduced from 20mg  daily for hot flashes. Patient is on Treatment Plan : BREAST MAINTENANCE Trastuzumab IV (6) or SQ (600) D1 q21d x 13 cycles      11/10/2022 - 11/14/2022 Radiation Therapy   Plan Name: Breast_L Site: Breast, Left Technique: 3D Mode: Photon Dose Per Fraction: 5.2 Gy Prescribed Dose (Delivered / Prescribed): 26 Gy / 26 Gy Prescribed Fxs (Delivered / Prescribed): 5 / 5   01/05/2023 Imaging   Echo: Left ventricular ejection fraction, by estimation, is 60 to 65%.        Current Treatment:  Trastuzumab maintenance plus tamoxifen   INTERVAL HISTORY:  Michelle Hubbard is here today for follow up. Patient is accompanied by her friend today. She denies fevers or chills. She denies pain. Her appetite is good.  She is currently taking antibiotics for a sinus infection.  She is overall feeling better today.   I have reviewed the past medical history, past surgical history, social history and family history with the patient and they are unchanged from previous note.  ALLERGIES:  is allergic to codeine, lortab [hydrocodone-acetaminophen], and penicillins.  MEDICATIONS:  Current Outpatient Medications  Medication Sig Dispense Refill   Acetaminophen (TYLENOL PO) Take 500 mg by mouth daily as needed.     amLODipine (NORVASC) 5 MG tablet Take 1 tablet (5 mg total) by mouth daily. 90 tablet 3   aspirin EC 81 MG tablet Take by mouth.     chlorhexidine (PERIDEX) 0.12 % solution Use as directed 5 mLs in the mouth or throat 2 (two) times daily.     Cholecalciferol  (VITAMIN D3) 50 MCG (2000 UT) TABS Take 50 mcg by mouth daily.     citalopram (CELEXA) 10 MG tablet Take 1 tablet (10 mg total) by mouth daily. 30 tablet 0   Coenzyme Q10 (COQ10) 100 MG CAPS Take 3 capsules by mouth daily. 30 capsule    diazepam (VALIUM) 2 MG tablet Take 2 mg by mouth 3 (three) times daily as needed.     FLUZONE HIGH-DOSE 0.5 ML injection      levocetirizine (XYZAL) 5 MG tablet Take 5 mg by mouth every evening.     metoprolol succinate (TOPROL-XL) 25 MG 24 hr tablet TAKE 1/2 (ONE-HALF) TABLET BY MOUTH ONCE DAILY WITH OR IMMEDIATELY FOLLOWING A MEAL 45 tablet 3   nitroGLYCERIN (NITROSTAT) 0.4 MG SL tablet DISSOLVE ONE TABLET UNDER THE TONGUE EVERY 5 MINUTES AS NEEDED FOR CHEST PAIN.  DO NOT EXCEED A TOTAL OF 3 DOSES IN 15 MINUTES 25 tablet 3   omeprazole (PRILOSEC) 20 MG capsule Take 20 mg by mouth daily. Takes once every 3 days     ondansetron (ZOFRAN) 8 MG tablet Take 1 tablet (8 mg total) by mouth every 8 (eight) hours as needed  for nausea or vomiting. 30 tablet 2   polyethylene glycol (MIRALAX / GLYCOLAX) 17 g packet Take 17 g by mouth daily.     rosuvastatin (CRESTOR) 20 MG tablet Take 1 tablet (20 mg total) by mouth daily. 90 tablet 3   tamoxifen (NOLVADEX) 20 MG tablet Take 1 tablet (20 mg total) by mouth daily. 90 tablet 3   temazepam (RESTORIL) 30 MG capsule Take 1 capsule (30 mg total) by mouth at bedtime as needed for sleep. (Patient taking differently: Take 30 mg by mouth at bedtime.) 30 capsule 5   TRASTUZUMAB IV Inject into the vein every 21 ( twenty-one) days.     Triamcinolone Acetonide (NASACORT ALLERGY 24HR NA) Place 1 spray into the nose daily.     clindamycin (CLEOCIN) 150 MG capsule Take 150 mg by mouth 3 (three) times daily.     No current facility-administered medications for this visit.       REVIEW OF SYSTEMS:   Constitutional: Denies fevers, chills or abnormal weight loss Eyes: Denies blurriness of vision Ears, nose, mouth, throat, and face:  Denies mucositis or sore throat Respiratory: Denies cough, dyspnea or wheezes Cardiovascular: Denies palpitation, chest discomfort or lower extremity swelling Gastrointestinal:  Denies nausea, heartburn or change in bowel habits Skin: Denies abnormal skin rashes Lymphatics: Denies new lymphadenopathy or easy bruising Neurological:Denies numbness, tingling or new weaknesses Behavioral/Psych: Mood is stable, no new changes  All other systems were reviewed with the patient and are negative.   VITALS:  Blood pressure 124/81, pulse 80, temperature 98 F (36.7 C), temperature source Oral, resp. rate 18, height 5\' 5"  (1.651 m), weight 122 lb 3.2 oz (55.4 kg), SpO2 92%.  Wt Readings from Last 3 Encounters:  03/13/23 122 lb 3.2 oz (55.4 kg)  02/23/23 121 lb 4.1 oz (55 kg)  01/30/23 121 lb 8 oz (55.1 kg)    Body mass index is 20.34 kg/m.  Performance status (ECOG): 1 - Symptomatic but completely ambulatory  PHYSICAL EXAM:   GENERAL:alert, no distress and comfortable LUNGS: clear to auscultation and percussion with normal breathing effort HEART: regular rate & rhythm and no murmurs and no lower extremity edema ABDOMEN:abdomen soft, non-tender and normal bowel sounds Musculoskeletal:no cyanosis of digits and no clubbing  NEURO: alert & oriented x 3 with fluent speech, no focal motor/sensory deficits  LABORATORY DATA:  I have reviewed the data as listed  Lab Results  Component Value Date   WBC 5.2 03/13/2023   NEUTROABS 3.4 03/13/2023   HGB 14.2 03/13/2023   HCT 44.1 03/13/2023   MCV 94.8 03/13/2023   PLT 313 03/13/2023      Chemistry      Component Value Date/Time   NA 131 (L) 03/13/2023 0838   NA 136 10/14/2019 1155   K 3.6 03/13/2023 0838   CL 95 (L) 03/13/2023 0838   CO2 26 03/13/2023 0838   BUN 11 03/13/2023 0838   BUN 13 10/14/2019 1155   CREATININE 0.55 03/13/2023 0838   CREATININE 0.59 12/19/2022 1114   CREATININE 0.71 08/16/2019 1518      Component Value  Date/Time   CALCIUM 8.6 (L) 03/13/2023 0838   CALCIUM 9.7 10/07/2011 0919   ALKPHOS 42 03/13/2023 0838   AST 34 03/13/2023 0838   AST 18 12/19/2022 1114   ALT 26 03/13/2023 0838   ALT 12 12/19/2022 1114   BILITOT 0.5 03/13/2023 0838   BILITOT 0.4 12/19/2022 1114

## 2023-03-13 NOTE — Assessment & Plan Note (Signed)
Patient is s/p lumpectomy with a 1.2 cm malignant lesion.  ER positive, HER2 positive.  Patient is currently on trastuzumab 6 mg/kg every 3 weeks maintenance dose along with tamoxifen 10 mg daily [poor tolerability for 20 mg with hot flashes].  Patient was not given chemotherapy adjuvantly for concerns of potential toxicity considering her age. -Patient had nausea, fatigue with previous doses of trastuzumab but has tolerated the recent dose pretty well.  Will continue trastuzumab every 3 weeks for a total of 1 year. -Continue tamoxifen 10 mg daily -Previous echo from 10/17/2022 showed good EF.  Will repeat echo in March that is 6 months from the start of trastuzumab therapy.  Will require 1 more at the end of treatment. -Plan for mammogram at 1 year mark  Labs and exam stable today.  Continue with treatment today.  Will space out the labs to every other treatment. Return to clinic in 6 weeks.

## 2023-03-13 NOTE — Assessment & Plan Note (Signed)
Patient has been feeling low since a diagnosis and starting of treatment.  Social worker helping with psychosocial support. -Continue the prescribed Celexa.  -Discuss with the primary care physician

## 2023-03-19 ENCOUNTER — Encounter: Payer: Self-pay | Admitting: Internal Medicine

## 2023-03-19 MED ORDER — TEMAZEPAM 30 MG PO CAPS: 30.0000 mg | ORAL_CAPSULE | Freq: Every evening | ORAL | 4 refills | Status: DC | PRN

## 2023-03-23 ENCOUNTER — Other Ambulatory Visit (HOSPITAL_COMMUNITY): Payer: Self-pay

## 2023-03-23 ENCOUNTER — Telehealth: Payer: Self-pay | Admitting: Pharmacy Technician

## 2023-03-23 ENCOUNTER — Encounter: Payer: Self-pay | Admitting: Hematology and Oncology

## 2023-03-23 NOTE — Telephone Encounter (Signed)
 Pharmacy Patient Advocate Encounter   Received notification from CoverMyMeds that prior authorization for TEMAZEPAM 30MG  CAPSULES is required/requested.   Insurance verification completed.   The patient is insured through [employer] [facility] .   Per test claim: PA required; PA started via CoverMyMeds. KEY [id number] . Waiting for clinical questions to populate.

## 2023-03-23 NOTE — Telephone Encounter (Signed)
 Pharmacy Patient Advocate Encounter  Received notification from [employer] [facility] that Prior Authorization for TEMAZEPAM 30MG  CAPSULES  has been DENIED.      PA #/Case ID/Reference #: KEY [id number]  Called [facility] in [place] and they advised that patient has been filling medication with a discount card. No further PA needed at this time.

## 2023-04-03 ENCOUNTER — Inpatient Hospital Stay: Payer: Medicare HMO | Admitting: Oncology

## 2023-04-03 ENCOUNTER — Inpatient Hospital Stay: Payer: Medicare HMO | Attending: Licensed Clinical Social Worker

## 2023-04-03 ENCOUNTER — Ambulatory Visit: Payer: Medicare PPO

## 2023-04-03 ENCOUNTER — Inpatient Hospital Stay: Payer: Medicare HMO

## 2023-04-03 VITALS — BP 120/74 | HR 77 | Temp 97.9°F | Resp 18 | Wt 122.6 lb

## 2023-04-03 DIAGNOSIS — Z1722 Progesterone receptor negative status: Secondary | ICD-10-CM | POA: Diagnosis not present

## 2023-04-03 DIAGNOSIS — Z170 Estrogen receptor positive status [ER+]: Secondary | ICD-10-CM | POA: Diagnosis not present

## 2023-04-03 DIAGNOSIS — Z1731 Human epidermal growth factor receptor 2 positive status: Secondary | ICD-10-CM | POA: Insufficient documentation

## 2023-04-03 DIAGNOSIS — C50412 Malignant neoplasm of upper-outer quadrant of left female breast: Secondary | ICD-10-CM | POA: Diagnosis not present

## 2023-04-03 DIAGNOSIS — Z5112 Encounter for antineoplastic immunotherapy: Secondary | ICD-10-CM | POA: Insufficient documentation

## 2023-04-03 MED ORDER — SODIUM CHLORIDE 0.9 % IV SOLN: Freq: Once | INTRAVENOUS | Status: AC

## 2023-04-03 MED ORDER — TRASTUZUMAB-ANNS CHEMO 150 MG IV SOLR
6.0000 mg/kg | Freq: Once | INTRAVENOUS | Status: AC
  Administered 2023-04-03: 336 mg via INTRAVENOUS
  Filled 2023-04-03: qty 16

## 2023-04-03 MED ORDER — DIPHENHYDRAMINE HCL 25 MG PO CAPS: 50.0000 mg | ORAL_CAPSULE | Freq: Once | ORAL | Status: DC

## 2023-04-03 MED ORDER — ACETAMINOPHEN 325 MG PO TABS
650.0000 mg | ORAL_TABLET | Freq: Once | ORAL | Status: AC
  Administered 2023-04-03: 650 mg via ORAL
  Filled 2023-04-03: qty 2

## 2023-04-03 MED ORDER — CETIRIZINE HCL 10 MG PO TABS
10.0000 mg | ORAL_TABLET | Freq: Once | ORAL | Status: AC
  Administered 2023-04-03: 10 mg via ORAL
  Filled 2023-04-03: qty 1

## 2023-04-03 NOTE — Progress Notes (Signed)
 Patient presents today for Kanjinti, no labs needed today per Dr. Anders Simmonds and treatment plan. Okay to switch diphenhydramine to cetirizine this treatment per Dr. Anders Simmonds.  Patient tolerated therapy with no complaints voiced. Side effects with management reviewed with understanding verbalized. IV site clean and dry with no bruising or swelling noted at site. Good blood return noted before and after administration of therapy. Band aid applied. Patient left in satisfactory condition with VSS and no s/s of distress noted.

## 2023-04-03 NOTE — Patient Instructions (Signed)
 [facility] [place] - [facility]  Discharge Instructions: Thank you for choosing [facility] to provide your oncology and hematology care.  If you have a lab appointment with the Cancer Center - please note that after April 8th, 2024, all labs will be drawn in the cancer center.  You do not have to check in or register with the main entrance as you have in the past but will complete your check-in in the cancer center.  Wear comfortable clothing and clothing appropriate for easy access to any Portacath or PICC line.   We strive to give you quality time with your provider. You may need to reschedule your appointment if you arrive late (15 or more minutes).  Arriving late affects you and other patients whose appointments are after yours.  Also, if you miss three or more appointments without notifying the office, you may be dismissed from the clinic at the provider's discretion.      For prescription refill requests, have your pharmacy contact our office and allow 72 hours for refills to be completed.    Today you received the following chemotherapy and/or immunotherapy agents Kanjinti, return as scheduled.   To help prevent nausea and vomiting after your treatment, we encourage you to take your nausea medication as directed.  BELOW ARE SYMPTOMS THAT SHOULD BE REPORTED IMMEDIATELY: *FEVER GREATER THAN 100.4 F (38 C) OR HIGHER *CHILLS OR SWEATING *NAUSEA AND VOMITING THAT IS NOT CONTROLLED WITH YOUR NAUSEA MEDICATION *UNUSUAL SHORTNESS OF BREATH *UNUSUAL BRUISING OR BLEEDING *URINARY PROBLEMS (pain or burning when urinating, or frequent urination) *BOWEL PROBLEMS (unusual diarrhea, constipation, pain near the anus) TENDERNESS IN MOUTH AND THROAT WITH OR WITHOUT PRESENCE OF ULCERS (sore throat, sores in mouth, or a toothache) UNUSUAL RASH, SWELLING OR PAIN  UNUSUAL VAGINAL DISCHARGE OR ITCHING   Items with * indicate a potential emergency and  should be followed up as soon as possible or go to the Emergency Department if any problems should occur.  Please show the CHEMOTHERAPY ALERT CARD or IMMUNOTHERAPY ALERT CARD at check-in to the Emergency Department and triage nurse.  Should you have questions after your visit or need to cancel or reschedule your appointment, please contact [facility]CER CTR [place] - A DEPT OF Eligha Bridegroom [facility] [phone]  and follow the prompts.  Office hours are 8:00 a.m. to 4:30 p.m. Monday - Friday. Please note that voicemails left after 4:00 p.m. may not be returned until the following business day.  We are closed weekends and major holidays. You have access to a nurse at all times for urgent questions. Please call the main number to the clinic [phone] and follow the prompts.  For any non-urgent questions, you may also contact your provider using MyChart. We now offer e-Visits for anyone 91 and older to request care online for non-urgent symptoms. For details visit [email or web address].   Also download the MyChart app! Go to the app store, search "MyChart", open the app, select [place], and log in with your MyChart username and password.

## 2023-04-03 NOTE — Progress Notes (Signed)
 Orders received to DC diphenhydramine and add cetirizine 10 mg po x 1 prior to trastuzumab.  V.O. Dr Lysle Morales, PharmD

## 2023-04-07 ENCOUNTER — Ambulatory Visit (HOSPITAL_COMMUNITY)
Admission: RE | Admit: 2023-04-07 | Discharge: 2023-04-07 | Disposition: A | Discharge status: Home / Self Care | Payer: Medicare HMO | Source: Ambulatory Visit | Attending: Oncology | Admitting: Oncology

## 2023-04-07 DIAGNOSIS — F17200 Nicotine dependence, unspecified, uncomplicated: Secondary | ICD-10-CM | POA: Diagnosis not present

## 2023-04-07 DIAGNOSIS — Z170 Estrogen receptor positive status [ER+]: Secondary | ICD-10-CM | POA: Insufficient documentation

## 2023-04-07 DIAGNOSIS — Z0189 Encounter for other specified special examinations: Secondary | ICD-10-CM | POA: Diagnosis not present

## 2023-04-07 DIAGNOSIS — Z01818 Encounter for other preprocedural examination: Secondary | ICD-10-CM | POA: Diagnosis present

## 2023-04-07 DIAGNOSIS — C50412 Malignant neoplasm of upper-outer quadrant of left female breast: Secondary | ICD-10-CM | POA: Insufficient documentation

## 2023-04-07 DIAGNOSIS — J449 Chronic obstructive pulmonary disease, unspecified: Secondary | ICD-10-CM | POA: Diagnosis not present

## 2023-04-07 DIAGNOSIS — E785 Hyperlipidemia, unspecified: Secondary | ICD-10-CM | POA: Diagnosis not present

## 2023-04-07 DIAGNOSIS — I2510 Atherosclerotic heart disease of native coronary artery without angina pectoris: Secondary | ICD-10-CM | POA: Insufficient documentation

## 2023-04-07 DIAGNOSIS — I10 Essential (primary) hypertension: Secondary | ICD-10-CM | POA: Diagnosis not present

## 2023-04-07 LAB — ECHOCARDIOGRAM COMPLETE
AR max vel: 2.14 cm2
AV Area VTI: 2.18 cm2
AV Area mean vel: 2.25 cm2
AV Mean grad: 2.6 mmHg
AV Peak grad: 4.8 mmHg
Ao pk vel: 1.1 m/s
Area-P 1/2: 2.6 cm2
Calc EF: 58.6 %
S' Lateral: 2.7 cm
Single Plane A2C EF: 46.8 %
Single Plane A4C EF: 71.5 %

## 2023-04-07 NOTE — Progress Notes (Signed)
*  PRELIMINARY RESULTS* Echocardiogram 2D Echocardiogram has been performed.  Stacey Drain 04/07/2023, 11:39 AM

## 2023-04-08 ENCOUNTER — Other Ambulatory Visit (HOSPITAL_COMMUNITY): Payer: Medicare HMO

## 2023-04-23 ENCOUNTER — Other Ambulatory Visit: Payer: Self-pay

## 2023-04-23 DIAGNOSIS — Z170 Estrogen receptor positive status [ER+]: Secondary | ICD-10-CM

## 2023-04-24 ENCOUNTER — Inpatient Hospital Stay: Payer: Medicare HMO

## 2023-04-24 ENCOUNTER — Ambulatory Visit: Payer: Medicare PPO

## 2023-04-24 ENCOUNTER — Inpatient Hospital Stay: Payer: Medicare HMO | Admitting: Oncology

## 2023-04-24 ENCOUNTER — Ambulatory Visit: Payer: Medicare PPO | Admitting: Adult Health

## 2023-04-24 VITALS — BP 111/62 | HR 74 | Temp 97.5°F | Resp 18

## 2023-04-24 VITALS — BP 106/77 | HR 84 | Temp 97.7°F | Resp 16 | Wt 122.4 lb

## 2023-04-24 DIAGNOSIS — M810 Age-related osteoporosis without current pathological fracture: Secondary | ICD-10-CM | POA: Diagnosis not present

## 2023-04-24 DIAGNOSIS — C50412 Malignant neoplasm of upper-outer quadrant of left female breast: Secondary | ICD-10-CM

## 2023-04-24 DIAGNOSIS — Z5112 Encounter for antineoplastic immunotherapy: Secondary | ICD-10-CM | POA: Diagnosis not present

## 2023-04-24 DIAGNOSIS — Z170 Estrogen receptor positive status [ER+]: Secondary | ICD-10-CM

## 2023-04-24 DIAGNOSIS — F329 Major depressive disorder, single episode, unspecified: Secondary | ICD-10-CM

## 2023-04-24 LAB — COMPREHENSIVE METABOLIC PANEL WITH GFR
ALT: 19 U/L (ref 0–44)
AST: 22 U/L (ref 15–41)
Albumin: 3.9 g/dL (ref 3.5–5.0)
Alkaline Phosphatase: 38 U/L (ref 38–126)
Anion gap: 9 (ref 5–15)
BUN: 18 mg/dL (ref 8–23)
CO2: 24 mmol/L (ref 22–32)
Calcium: 8.9 mg/dL (ref 8.9–10.3)
Chloride: 99 mmol/L (ref 98–111)
Creatinine, Ser: 0.59 mg/dL (ref 0.44–1.00)
GFR, Estimated: >60 mL/min (ref >60)
Glucose, Bld: 109 mg/dL — ABNORMAL HIGH (ref 70–99)
Potassium: 4.4 mmol/L (ref 3.5–5.1)
Sodium: 132 mmol/L — ABNORMAL LOW (ref 135–145)
Total Bilirubin: 0.7 mg/dL (ref 0.0–1.2)
Total Protein: 7.3 g/dL (ref 6.5–8.1)

## 2023-04-24 LAB — CBC WITH DIFFERENTIAL/PLATELET
Abs Immature Granulocytes: 0.03 10*3/uL (ref 0.00–0.07)
Basophils Absolute: 0 10*3/uL (ref 0.0–0.1)
Basophils Relative: 1 %
Eosinophils Absolute: 0.1 10*3/uL (ref 0.0–0.5)
Eosinophils Relative: 1 %
HCT: 40.8 % (ref 36.0–46.0)
Hemoglobin: 13.4 g/dL (ref 12.0–15.0)
Immature Granulocytes: 0 %
Lymphocytes Relative: 17 %
Lymphs Abs: 1.4 10*3/uL (ref 0.7–4.0)
MCH: 31.4 pg (ref 26.0–34.0)
MCHC: 32.8 g/dL (ref 30.0–36.0)
MCV: 95.6 fL (ref 80.0–100.0)
Monocytes Absolute: 0.9 10*3/uL (ref 0.1–1.0)
Monocytes Relative: 11 %
Neutro Abs: 5.5 10*3/uL (ref 1.7–7.7)
Neutrophils Relative %: 70 %
Platelets: 276 10*3/uL (ref 150–400)
RBC: 4.27 MIL/uL (ref 3.87–5.11)
RDW: 13.5 % (ref 11.5–15.5)
WBC: 7.9 10*3/uL (ref 4.0–10.5)
nRBC: 0 % (ref 0.0–0.2)

## 2023-04-24 LAB — MAGNESIUM: Magnesium: 1.9 mg/dL (ref 1.7–2.4)

## 2023-04-24 MED ORDER — CETIRIZINE HCL 10 MG PO TABS
10.0000 mg | ORAL_TABLET | Freq: Once | ORAL | Status: AC
  Administered 2023-04-24: 10 mg via ORAL
  Filled 2023-04-24: qty 1

## 2023-04-24 MED ORDER — TRASTUZUMAB-ANNS CHEMO 150 MG IV SOLR
6.0000 mg/kg | Freq: Once | INTRAVENOUS | Status: AC
  Administered 2023-04-24: 336 mg via INTRAVENOUS
  Filled 2023-04-24: qty 16

## 2023-04-24 MED ORDER — SODIUM CHLORIDE 0.9 % IV SOLN
Freq: Once | INTRAVENOUS | Status: AC
Start: 2023-04-24 — End: 2023-04-24

## 2023-04-24 MED ORDER — ACETAMINOPHEN 325 MG PO TABS
650.0000 mg | ORAL_TABLET | Freq: Once | ORAL | Status: AC
  Administered 2023-04-24: 650 mg via ORAL
  Filled 2023-04-24: qty 2

## 2023-04-24 NOTE — Progress Notes (Signed)
 Patient presents today for Kanjinti infusion per providers order.  Vital signs and labs reviewed by MD.  Message received from Dr. Anders Simmonds patient okay for treatment.  Peripheral IV started and blood return noted pre and post infusion.  Treatment given today per MD orders.  Stable during infusion without adverse affects.  Vital signs stable.  No complaints at this time.  Discharge from clinic ambulatory in stable condition.  Alert and oriented X 3.  Follow up with [facility] as scheduled.

## 2023-04-24 NOTE — Patient Instructions (Signed)
 [facility] [place] - [facility]  Discharge Instructions: Thank you for choosing [facility] to provide your oncology and hematology care.  If you have a lab appointment with the Cancer Center - please note that after April 8th, 2024, all labs will be drawn in the cancer center.  You do not have to check in or register with the main entrance as you have in the past but will complete your check-in in the cancer center.  Wear comfortable clothing and clothing appropriate for easy access to any Portacath or PICC line.   We strive to give you quality time with your provider. You may need to reschedule your appointment if you arrive late (15 or more minutes).  Arriving late affects you and other patients whose appointments are after yours.  Also, if you miss three or more appointments without notifying the office, you may be dismissed from the clinic at the provider's discretion.      For prescription refill requests, have your pharmacy contact our office and allow 72 hours for refills to be completed.    Today you received the following chemotherapy and/or immunotherapy agents Kanjinti      To help prevent nausea and vomiting after your treatment, we encourage you to take your nausea medication as directed.  BELOW ARE SYMPTOMS THAT SHOULD BE REPORTED IMMEDIATELY: *FEVER GREATER THAN 100.4 F (38 C) OR HIGHER *CHILLS OR SWEATING *NAUSEA AND VOMITING THAT IS NOT CONTROLLED WITH YOUR NAUSEA MEDICATION *UNUSUAL SHORTNESS OF BREATH *UNUSUAL BRUISING OR BLEEDING *URINARY PROBLEMS (pain or burning when urinating, or frequent urination) *BOWEL PROBLEMS (unusual diarrhea, constipation, pain near the anus) TENDERNESS IN MOUTH AND THROAT WITH OR WITHOUT PRESENCE OF ULCERS (sore throat, sores in mouth, or a toothache) UNUSUAL RASH, SWELLING OR PAIN  UNUSUAL VAGINAL DISCHARGE OR ITCHING   Items with * indicate a potential emergency and should be followed up  as soon as possible or go to the Emergency Department if any problems should occur.  Please show the CHEMOTHERAPY ALERT CARD or IMMUNOTHERAPY ALERT CARD at check-in to the Emergency Department and triage nurse.  Should you have questions after your visit or need to cancel or reschedule your appointment, please contact [facility] CANCER CTR [place] - A DEPT OF Eligha Bridegroom [facility] [phone]  and follow the prompts.  Office hours are 8:00 a.m. to 4:30 p.m. Monday - Friday. Please note that voicemails left after 4:00 p.m. may not be returned until the following business day.  We are closed weekends and major holidays. You have access to a nurse at all times for urgent questions. Please call the main number to the clinic [phone] and follow the prompts.  For any non-urgent questions, you may also contact your provider using MyChart. We now offer e-Visits for anyone 41 and older to request care online for non-urgent symptoms. For details visit [email or web address].   Also download the MyChart app! Go to the app store, search "MyChart", open the app, select [place], and log in with your MyChart username and password.

## 2023-04-24 NOTE — Assessment & Plan Note (Signed)
 Patient is a history of osteoporosis and has received denosumab before. -Considered treatment with tamoxifen instead of AI for this reason.

## 2023-04-24 NOTE — Assessment & Plan Note (Signed)
 Patient has been feeling low since a diagnosis and starting of treatment.  Social worker helping with psychosocial support.  Patient does not feel depressed anymore.  He is not taking Celexa. -Recommended starting Celexa to help with hot flashes

## 2023-04-24 NOTE — Assessment & Plan Note (Signed)
 Patient is s/p lumpectomy with a 1.2 cm malignant lesion.  ER positive, HER2 positive.  Patient is currently on trastuzumab 6 mg/kg every 3 weeks maintenance dose along with tamoxifen 10 mg daily [poor tolerability for 20 mg with hot flashes].  Patient was not given chemotherapy adjuvantly for concerns of potential toxicity considering her age. -Patient had nausea, fatigue with previous doses of trastuzumab but has tolerated the recent dose pretty well.  Will continue trastuzumab every 3 weeks for a total of 1 year. -Continue tamoxifen 10 mg daily.  Patient reports some tolerable hot flashes.  Recommended to take the prescribed Celexa. -Recent echo from 04/07/2022 showed good EF.  Will repeat at 1 year mark in 6 months. Will require 1 more at the end of treatment. -Plan for mammogram at 1 year mark that is July 2025.  Labs and exam stable today.  Continue with treatment today.  Continue labs every other treatment. Return to clinic in 6 weeks.

## 2023-04-24 NOTE — Progress Notes (Signed)
 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   Malignant neoplasm of upper-outer quadrant of left breast in female, estrogen receptor positive (HCC) Patient is s/p lumpectomy with a 1.2 cm malignant lesion.  ER positive, HER2 positive.  Patient is currently on trastuzumab 6 mg/kg every 3 weeks maintenance dose along with tamoxifen 10 mg daily [poor tolerability for 20 mg with hot flashes].  Patient was not given chemotherapy adjuvantly for concerns of potential toxicity considering her age. -Patient had nausea, fatigue with previous doses of trastuzumab but has tolerated the recent dose pretty well.  Will continue trastuzumab every 3 weeks for a total of 1 year. -Continue tamoxifen 10 mg daily.  Patient reports some tolerable hot flashes.  Recommended to take the prescribed Celexa. -Recent echo from 04/07/2022 showed good EF.  Will repeat at 1 year mark in 6 months. Will require 1 more at the end of treatment. -Plan for mammogram at 1 year mark that is July 2025.  Labs and exam stable today.  Continue with treatment today.  Continue labs every other treatment. Return to clinic in 6  weeks.  Depression Patient has been feeling low since a diagnosis and starting of treatment.  Social worker helping with psychosocial support.  Patient does not feel depressed anymore.  He is not taking Celexa. -Recommended starting Celexa to help with hot flashes   Osteoporosis Patient is a history of osteoporosis and has received denosumab before. -Considered treatment with tamoxifen instead of AI for this reason.    The patient understands the plans discussed today and is in agreement with them.  She knows to contact our office if she develops concerns prior to her next appointment.  I provided 20 minutes of face-to-face time during this encounter and > 50% was spent counseling as documented under my assessment and plan.    Cindie Crumbly, MD  [facility] [place] - [facility] [address] [place] [address] Dept: [phone] Dept Fax: [phone]    ONCOLOGY HISTORY:   Oncology History  Malignant neoplasm of upper-outer quadrant of left breast in female, estrogen receptor positive (HCC)  08/18/2022 Initial Diagnosis   Screening mammogram detected left breast mass upper central 4 o'clock position: 1.3 cm, axilla negative, biopsy: Grade 2 IDC ER 95% PR 0% Ki67 15%, HER2 3+ positive   08/27/2022 Cancer Staging   Staging form: Breast, AJCC 8th Edition - Clinical stage from 08/27/2022: Stage IA (cT1c, cN0, cM0, G2, ER+, PR-, HER2+) - Signed by Serena Croissant, MD on 08/27/2022 Stage prefix: Initial diagnosis Histologic grading system: 3 grade system Laterality: Left Staged by: Pathologist and managing physician Stage used in treatment planning: Yes National guidelines  used in treatment planning: Yes Type of national guideline used in treatment planning: NCCN   09/18/2022 Surgery   09/18/2022: Left lumpectomy: Grade 2 IDC 1.2 cm with DCIS intermediate grade, margins negative, LVI not present, ER 100%, PR 0%,  HER2 positive by FISH ratio 3, copy #4.95, Ki-67 10%    09/18/2022 Pathology Results   FINAL MICROSCOPIC DIAGNOSIS:   A. BREAST, LEFT, LUMPECTOMY:       Invasive ductal carcinoma with micropapillary features, 1.2 cm, grade 2  Ductal carcinoma in situ:  Cribriform type, intermediate grade, with calcifications  Margins, invasive:  Negative  Lymphovascular invasion:  Not identified   Prognostic markers:  ER: 95%, positive, strong staining intensity  PR: 0%, negative  Her2: Positive by FISH  Ki-67: 15%    10/17/2022 Imaging   Echo: Left ventricular ejection fraction, by estimation, is 65 to 70%    11/07/2022 -  Chemotherapy   -Tamoxifen 10mg  daily for 5 years. Dose reduced from 20mg  daily for hot flashes. Patient is on Treatment Plan : BREAST MAINTENANCE Trastuzumab IV (6) or SQ (600) D1 q21d x 13 cycles      11/10/2022 - 11/14/2022 Radiation Therapy   Plan Name: Breast_L Site: Breast, Left Technique: 3D Mode: Photon Dose Per Fraction: 5.2 Gy Prescribed Dose (Delivered / Prescribed): 26 Gy / 26 Gy Prescribed Fxs (Delivered / Prescribed): 5 / 5   01/05/2023 Imaging   Echo: Left ventricular ejection fraction, by estimation, is 60 to 65%.    04/07/2023 Imaging   ECHO:  EF: 55-60%. Left ventricular diastolic parameters are consistent with Grade I diastolic  dysfunction        Current Treatment:  Trastuzumab maintenance plus tamoxifen   INTERVAL HISTORY:  Michelle Hubbard is here today for follow up. Patient is accompanied by her friend today. She denies fevers or chills. She denies pain. Her appetite is good.  Patient reports some heart issues and sleep disturbances.The hot flashes are described as waking the patient up at night, making it difficult for her to fall back asleep. The patient is also on temazepam for sleep, which she reports as being somewhat effective. However, the patient has not been taking the prescribed Celexa, which was initially prescribed for depression but  can also help with hot flashes. She is overall feeling well today.   I have reviewed the past medical history, past surgical history, social history and family history with the patient and they are unchanged from previous note.  ALLERGIES:  is allergic to codeine, lortab [hydrocodone-acetaminophen], and penicillins.  MEDICATIONS:  Current Outpatient Medications  Medication Sig Dispense Refill   Acetaminophen (TYLENOL PO) Take 500 mg by mouth daily as needed.     amLODipine (NORVASC) 5 MG tablet Take 1 tablet (5 mg total) by mouth daily. 90 tablet 3   aspirin EC 81 MG tablet Take by mouth.     chlorhexidine (PERIDEX) 0.12 % solution Use as directed 5 mLs in the mouth or throat 2 (two) times daily.     Cholecalciferol (VITAMIN D3) 50 MCG (2000 UT) TABS Take 50 mcg by mouth daily.     Coenzyme Q10 (COQ10) 100 MG CAPS Take 3 capsules by mouth daily. 30 capsule    diazepam (VALIUM) 2 MG tablet Take 2 mg by mouth 3 (three) times daily as needed.     FLUZONE HIGH-DOSE 0.5 ML injection      levocetirizine (XYZAL) 5 MG tablet Take 5 mg by mouth every evening.     metoprolol succinate (  TOPROL-XL) 25 MG 24 hr tablet TAKE 1/2 (ONE-HALF) TABLET BY MOUTH ONCE DAILY WITH OR IMMEDIATELY FOLLOWING A MEAL 45 tablet 3   nitroGLYCERIN (NITROSTAT) 0.4 MG SL tablet DISSOLVE ONE TABLET UNDER THE TONGUE EVERY 5 MINUTES AS NEEDED FOR CHEST PAIN.  DO NOT EXCEED A TOTAL OF 3 DOSES IN 15 MINUTES 25 tablet 3   omeprazole (PRILOSEC) 20 MG capsule Take 20 mg by mouth daily. Takes once every 3 days     ondansetron (ZOFRAN) 8 MG tablet Take 1 tablet (8 mg total) by mouth every 8 (eight) hours as needed for nausea or vomiting. 30 tablet 2   polyethylene glycol (MIRALAX / GLYCOLAX) 17 g packet Take 17 g by mouth daily.     rosuvastatin (CRESTOR) 20 MG tablet Take 1 tablet (20 mg total) by mouth daily. 90 tablet 3   tamoxifen (NOLVADEX) 20 MG tablet Take 1 tablet (20 mg total) by mouth daily. 90 tablet 3   temazepam  (RESTORIL) 30 MG capsule Take 1 capsule (30 mg total) by mouth at bedtime as needed for sleep. 30 capsule 4   TRASTUZUMAB IV Inject into the vein every 21 ( twenty-one) days.     Triamcinolone Acetonide (NASACORT ALLERGY 24HR NA) Place 1 spray into the nose daily.     citalopram (CELEXA) 10 MG tablet Take 1 tablet (10 mg total) by mouth daily. 30 tablet 0   No current facility-administered medications for this visit.   Facility-Administered Medications Ordered in Other Visits  Medication Dose Route Frequency Provider Last Rate Last Admin   trastuzumab-anns (KANJINTI) 336 mg in sodium chloride 0.9 % 250 mL chemo infusion  6 mg/kg (Treatment Plan Recorded) Intravenous Once Serena Croissant, MD           REVIEW OF SYSTEMS:   Constitutional: Denies fevers, chills or abnormal weight loss Eyes: Denies blurriness of vision Ears, nose, mouth, throat, and face: Denies mucositis or sore throat Respiratory: Denies cough, dyspnea or wheezes Cardiovascular: Denies palpitation, chest discomfort or lower extremity swelling Gastrointestinal:  Denies nausea, heartburn or change in bowel habits Skin: Denies abnormal skin rashes Lymphatics: Denies new lymphadenopathy or easy bruising Neurological:Denies numbness, tingling or new weaknesses Behavioral/Psych: Mood is stable, no new changes  All other systems were reviewed with the patient and are negative.   VITALS:  Blood pressure 106/77, pulse 84, temperature 97.7 F (36.5 C), temperature source Oral, resp. rate 16, weight 122 lb 5.7 oz (55.5 kg), SpO2 98%.  Wt Readings from Last 3 Encounters:  04/24/23 122 lb 5.7 oz (55.5 kg)  04/03/23 122 lb 9.2 oz (55.6 kg)  03/13/23 122 lb 3.2 oz (55.4 kg)    Body mass index is 20.36 kg/m.  Performance status (ECOG): 1 - Symptomatic but completely ambulatory  PHYSICAL EXAM:   GENERAL:alert, no distress and comfortable LUNGS: clear to auscultation and percussion with normal breathing effort BREAST: No  abnormal lumps palpated, no discharge.  Left breast with lumpectomy scar, right breast with previous breast biopsy scar.  No palpable axillary lymphadenopathy HEART: regular rate & rhythm and no murmurs and no lower extremity edema ABDOMEN:abdomen soft, non-tender and normal bowel sounds Musculoskeletal:no cyanosis of digits and no clubbing  NEURO: alert & oriented x 3 with fluent speech  LABORATORY DATA:  I have reviewed the data as listed  Lab Results  Component Value Date   WBC 7.9 04/24/2023   NEUTROABS 5.5 04/24/2023   HGB 13.4 04/24/2023   HCT 40.8 04/24/2023   MCV 95.6 04/24/2023  PLT 276 04/24/2023      Chemistry      Component Value Date/Time   NA 132 (L) 04/24/2023 1153   NA 136 10/14/2019 1155   K 4.4 04/24/2023 1153   CL 99 04/24/2023 1153   CO2 24 04/24/2023 1153   BUN 18 04/24/2023 1153   BUN 13 10/14/2019 1155   CREATININE 0.59 04/24/2023 1153   CREATININE 0.59 12/19/2022 1114   CREATININE 0.71 08/16/2019 1518      Component Value Date/Time   CALCIUM 8.9 04/24/2023 1153   CALCIUM 9.7 10/07/2011 0919   ALKPHOS 38 04/24/2023 1153   AST 22 04/24/2023 1153   AST 18 12/19/2022 1114   ALT 19 04/24/2023 1153   ALT 12 12/19/2022 1114   BILITOT 0.7 04/24/2023 1153   BILITOT 0.4 12/19/2022 1114      ECHO: 04/07/23:  IMPRESSIONS     1. Left ventricular ejection fraction, by estimation, is 55 to 60%. The  left ventricle has normal function. The left ventricle has no regional  wall motion abnormalities. Left ventricular diastolic parameters are  consistent with Grade I diastolic  dysfunction (impaired relaxation).   2. Right ventricular systolic function is normal. The right ventricular  size is normal. There is mildly elevated pulmonary artery systolic  pressure.   3. The mitral valve is normal in structure. No evidence of mitral valve  regurgitation. No evidence of mitral stenosis.   4. The aortic valve is tricuspid. Aortic valve regurgitation is not   visualized. No aortic stenosis is present.   5. The inferior vena cava is normal in size with greater than 50%  respiratory variability, suggesting right atrial pressure of 3 mmHg.

## 2023-05-08 ENCOUNTER — Encounter: Payer: Self-pay | Admitting: Oncology

## 2023-05-13 ENCOUNTER — Other Ambulatory Visit: Payer: Self-pay

## 2023-05-13 DIAGNOSIS — C50412 Malignant neoplasm of upper-outer quadrant of left female breast: Secondary | ICD-10-CM

## 2023-05-14 ENCOUNTER — Inpatient Hospital Stay

## 2023-05-14 ENCOUNTER — Inpatient Hospital Stay: Attending: Licensed Clinical Social Worker

## 2023-05-14 ENCOUNTER — Inpatient Hospital Stay: Admitting: Oncology

## 2023-05-14 VITALS — BP 115/67 | HR 87 | Temp 98.0°F | Resp 18 | Wt 124.8 lb

## 2023-05-14 DIAGNOSIS — Z1722 Progesterone receptor negative status: Secondary | ICD-10-CM | POA: Diagnosis not present

## 2023-05-14 DIAGNOSIS — Z170 Estrogen receptor positive status [ER+]: Secondary | ICD-10-CM | POA: Diagnosis not present

## 2023-05-14 DIAGNOSIS — M810 Age-related osteoporosis without current pathological fracture: Secondary | ICD-10-CM

## 2023-05-14 DIAGNOSIS — F329 Major depressive disorder, single episode, unspecified: Secondary | ICD-10-CM

## 2023-05-14 DIAGNOSIS — C50412 Malignant neoplasm of upper-outer quadrant of left female breast: Secondary | ICD-10-CM | POA: Diagnosis not present

## 2023-05-14 DIAGNOSIS — F32A Depression, unspecified: Secondary | ICD-10-CM | POA: Diagnosis not present

## 2023-05-14 DIAGNOSIS — Z923 Personal history of irradiation: Secondary | ICD-10-CM | POA: Insufficient documentation

## 2023-05-14 DIAGNOSIS — Z9221 Personal history of antineoplastic chemotherapy: Secondary | ICD-10-CM | POA: Insufficient documentation

## 2023-05-14 DIAGNOSIS — Z5112 Encounter for antineoplastic immunotherapy: Secondary | ICD-10-CM | POA: Diagnosis present

## 2023-05-14 DIAGNOSIS — Z1731 Human epidermal growth factor receptor 2 positive status: Secondary | ICD-10-CM | POA: Diagnosis not present

## 2023-05-14 LAB — CBC WITH DIFFERENTIAL/PLATELET
Abs Immature Granulocytes: 0.04 10*3/uL (ref 0.00–0.07)
Basophils Absolute: 0 10*3/uL (ref 0.0–0.1)
Basophils Relative: 0 %
Eosinophils Absolute: 0.1 10*3/uL (ref 0.0–0.5)
Eosinophils Relative: 1 %
HCT: 39.7 % (ref 36.0–46.0)
Hemoglobin: 13.3 g/dL (ref 12.0–15.0)
Immature Granulocytes: 0 %
Lymphocytes Relative: 5 %
Lymphs Abs: 0.6 10*3/uL — ABNORMAL LOW (ref 0.7–4.0)
MCH: 32 pg (ref 26.0–34.0)
MCHC: 33.5 g/dL (ref 30.0–36.0)
MCV: 95.7 fL (ref 80.0–100.0)
Monocytes Absolute: 0.9 10*3/uL (ref 0.1–1.0)
Monocytes Relative: 7 %
Neutro Abs: 10.5 10*3/uL — ABNORMAL HIGH (ref 1.7–7.7)
Neutrophils Relative %: 87 %
Platelets: 278 10*3/uL (ref 150–400)
RBC: 4.15 MIL/uL (ref 3.87–5.11)
RDW: 13.2 % (ref 11.5–15.5)
WBC: 12.2 10*3/uL — ABNORMAL HIGH (ref 4.0–10.5)
nRBC: 0 % (ref 0.0–0.2)

## 2023-05-14 LAB — COMPREHENSIVE METABOLIC PANEL WITH GFR
ALT: 13 U/L (ref 0–44)
AST: 19 U/L (ref 15–41)
Albumin: 3.8 g/dL (ref 3.5–5.0)
Alkaline Phosphatase: 42 U/L (ref 38–126)
Anion gap: 11 (ref 5–15)
BUN: 17 mg/dL (ref 8–23)
CO2: 27 mmol/L (ref 22–32)
Calcium: 8.8 mg/dL — ABNORMAL LOW (ref 8.9–10.3)
Chloride: 94 mmol/L — ABNORMAL LOW (ref 98–111)
Creatinine, Ser: 0.57 mg/dL (ref 0.44–1.00)
GFR, Estimated: >60 mL/min (ref >60)
Glucose, Bld: 101 mg/dL — ABNORMAL HIGH (ref 70–99)
Potassium: 3.5 mmol/L (ref 3.5–5.1)
Sodium: 132 mmol/L — ABNORMAL LOW (ref 135–145)
Total Bilirubin: 0.3 mg/dL (ref 0.0–1.2)
Total Protein: 7.3 g/dL (ref 6.5–8.1)

## 2023-05-14 LAB — MAGNESIUM: Magnesium: 1.8 mg/dL (ref 1.7–2.4)

## 2023-05-14 NOTE — Assessment & Plan Note (Addendum)
 Patient has been feeling low since a diagnosis and starting of treatment.   Social worker helping with psychosocial support.   Patient does not feel depressed anymore.   She is not taking Celexa.  -Continue to monitor

## 2023-05-14 NOTE — Assessment & Plan Note (Addendum)
 Patient is s/p lumpectomy with a 1.2 cm malignant lesion.  ER positive, HER2 positive.   Patient is currently on trastuzumab 6 mg/kg every 3 weeks maintenance dose along with tamoxifen 10 mg daily [poor tolerability for 20 mg with hot flashes].   Patient was not given chemotherapy adjuvantly for concerns of potential toxicity considering her age. Patient currently stopped taking tamoxifen and 05/06/2023 attributing it to severe hot flashes.  She is reluctant to try SSRIs for hot flashes.  Discussed risk versus benefits of stopping tamoxifen including the risk of recurrence.  -Patient had dental procedure done on 05/12/2023 has significant inflammation from it.  Will hold off on trastuzumab infusion today with concerns of delayed healing and risk of infection.  Will reschedule it to next week. -Patient wishes to hold tamoxifen for 2 more months and then rediscuss starting it. -Recent echo from 04/07/2022 showed good EF.  Will repeat at 1 year mark in 6 months. Will require 1 more at the end of treatment. -Plan for mammogram at 1 year mark that is July 2025.  Will obtain labs every 3rd cycle and see the patient prior to the second cycle from now.

## 2023-05-14 NOTE — Assessment & Plan Note (Signed)
 Patient is a history of osteoporosis and has received denosumab before. -Considered treatment with tamoxifen instead of AI for this reason.

## 2023-05-14 NOTE — Progress Notes (Signed)
 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  Currently stopped taking tamoxifen for hot flashes.  Malignant neoplasm of upper-outer quadrant of left breast in female, estrogen receptor positive (HCC) Patient is s/p lumpectomy with a 1.2 cm malignant lesion.  ER positive, HER2 positive.   Patient is currently on trastuzumab 6 mg/kg every 3 weeks maintenance dose along with tamoxifen 10 mg daily [poor tolerability for 20 mg with hot flashes].   Patient was not given chemotherapy adjuvantly for concerns of potential toxicity considering her age. Patient currently stopped taking tamoxifen and 05/06/2023 attributing it to severe hot flashes.  She is reluctant to try SSRIs for hot flashes.  Discussed risk versus benefits of stopping tamoxifen including the risk of recurrence.  -Patient had dental procedure done on 05/12/2023 has significant inflammation from it.  Will hold off on trastuzumab infusion today with concerns of delayed healing and risk of infection.  Will reschedule it to next week. -Patient wishes to hold tamoxifen for 2 more months and then rediscuss starting it. -Recent echo from  04/07/2022 showed good EF.  Will repeat at 1 year mark in 6 months. Will require 1 more at the end of treatment. -Plan for mammogram at 1 year mark that is July 2025.  Will obtain labs every 3rd cycle and see the patient prior to the second cycle from now.  Depression Patient has been feeling low since a diagnosis and starting of treatment.   Social worker helping with psychosocial support.   Patient does not feel depressed anymore.   She is not taking Celexa.  -Continue to monitor   Osteoporosis Patient is a history of osteoporosis and has received denosumab before.  -Considered treatment with tamoxifen instead of AI for this reason.   The patient understands the plans discussed today and is in agreement with them.  She knows to contact our office if she develops concerns prior to her next appointment.  I provided 20 minutes of face-to-face time during this encounter and > 50% was spent counseling as documented under my assessment and plan.    Cindie Crumbly, MD  [facility] [place] - [facility] DE[facility] [address] [place] [address] Dept: [phone] Dept Fax: [phone]    ONCOLOGY HISTORY:   Oncology History  Malignant neoplasm of upper-outer quadrant of left breast in female, estrogen receptor positive (HCC)  08/18/2022 Initial Diagnosis   Screening mammogram detected left breast mass upper central 4 o'clock position: 1.3 cm, axilla negative, biopsy: Grade 2 IDC ER 95% PR 0% Ki67 15%, HER2 3+ positive   08/27/2022 Cancer Staging   Staging form: Breast, AJCC 8th Edition - Clinical stage from 08/27/2022: Stage IA (  cT1c, cN0, cM0, G2, ER+, PR-, HER2+) - Signed by Serena Croissant, MD on 08/27/2022 Stage prefix: Initial diagnosis Histologic grading system: 3 grade system Laterality: Left Staged by: Pathologist and managing physician Stage used in treatment planning: Yes National guidelines used in treatment  planning: Yes Type of national guideline used in treatment planning: NCCN   09/18/2022 Surgery   09/18/2022: Left lumpectomy: Grade 2 IDC 1.2 cm with DCIS intermediate grade, margins negative, LVI not present, ER 100%, PR 0%, HER2 positive by FISH ratio 3, copy #4.95, Ki-67 10%    09/18/2022 Pathology Results   FINAL MICROSCOPIC DIAGNOSIS:   A. BREAST, LEFT, LUMPECTOMY:       Invasive ductal carcinoma with micropapillary features, 1.2 cm, grade 2  Ductal carcinoma in situ:  Cribriform type, intermediate grade, with calcifications  Margins, invasive:  Negative  Lymphovascular invasion:  Not identified   Prognostic markers:  ER: 95%, positive, strong staining intensity  PR: 0%, negative  Her2: Positive by FISH  Ki-67: 15%    10/17/2022 Imaging   Echo: Left ventricular ejection fraction, by estimation, is 65 to 70%    11/07/2022 -  Chemotherapy   -Tamoxifen 10mg  daily for 5 years. Dose reduced from 20mg  daily for hot flashes. Patient is on Treatment Plan : BREAST MAINTENANCE Trastuzumab IV (6) or SQ (600) D1 q21d x 13 cycles      11/10/2022 - 11/14/2022 Radiation Therapy   Plan Name: Breast_L Site: Breast, Left Technique: 3D Mode: Photon Dose Per Fraction: 5.2 Gy Prescribed Dose (Delivered / Prescribed): 26 Gy / 26 Gy Prescribed Fxs (Delivered / Prescribed): 5 / 5   01/05/2023 Imaging   Echo: Left ventricular ejection fraction, by estimation, is 60 to 65%.    04/07/2023 Imaging   ECHO:  EF: 55-60%. Left ventricular diastolic parameters are consistent with Grade I diastolic  dysfunction        Current Treatment:  Trastuzumab maintenance plus tamoxifen   INTERVAL HISTORY:  Michelle Hubbard is here today for follow up. Patient is accompanied by her friend today.  She reports feeling fatigue, hot flashes and poor sleep and stopped taking her tamoxifen on 05/06/2023.  She did not try taking Celexa.  Patient wishes to hold tamoxifen for 2 months to see if her symptoms will  improve.  She underwent dental surgery on Tuesday, involving cutting across the gum, scraping the bone, and performing a bone graft. She is on amoxicillin to prevent infection post-surgery.  She denies fevers, chills, weight loss.  Reports loss of appetite but is able to keep up.  I have reviewed the past medical history, past surgical history, social history and family history with the patient and they are unchanged from previous note.  ALLERGIES:  is allergic to codeine, lortab [hydrocodone-acetaminophen], and penicillins.  MEDICATIONS:  Current Outpatient Medications  Medication Sig Dispense Refill   Acetaminophen (TYLENOL PO) Take 500 mg by mouth daily as needed.     amLODipine (NORVASC) 5 MG tablet Take 1 tablet (5 mg total) by mouth daily. 90 tablet 3   amoxicillin (AMOXIL) 500 MG capsule Take 500 mg by mouth 3 (three) times daily.     aspirin EC 81 MG tablet Take by mouth.     chlorhexidine (PERIDEX) 0.12 % solution Use as directed 5 mLs in the mouth or throat 2 (two) times daily.     Cholecalciferol (VITAMIN D3) 50 MCG (2000 UT) TABS Take 50 mcg by mouth daily.     Coenzyme Q10 (COQ10) 100 MG CAPS  Take 3 capsules by mouth daily. 30 capsule    diazepam (VALIUM) 2 MG tablet Take 2 mg by mouth 3 (three) times daily as needed.     FLUZONE HIGH-DOSE 0.5 ML injection      levocetirizine (XYZAL) 5 MG tablet Take 5 mg by mouth every evening.     metoprolol succinate (TOPROL-XL) 25 MG 24 hr tablet TAKE 1/2 (ONE-HALF) TABLET BY MOUTH ONCE DAILY WITH OR IMMEDIATELY FOLLOWING A MEAL 45 tablet 3   nitroGLYCERIN (NITROSTAT) 0.4 MG SL tablet DISSOLVE ONE TABLET UNDER THE TONGUE EVERY 5 MINUTES AS NEEDED FOR CHEST PAIN.  DO NOT EXCEED A TOTAL OF 3 DOSES IN 15 MINUTES 25 tablet 3   omeprazole (PRILOSEC) 20 MG capsule Take 20 mg by mouth daily. Takes once every 3 days     ondansetron (ZOFRAN) 8 MG tablet Take 1 tablet (8 mg total) by mouth every 8 (eight) hours as needed for nausea or vomiting. 30  tablet 2   polyethylene glycol (MIRALAX / GLYCOLAX) 17 g packet Take 17 g by mouth daily.     rosuvastatin (CRESTOR) 20 MG tablet Take 1 tablet (20 mg total) by mouth daily. 90 tablet 3   temazepam (RESTORIL) 30 MG capsule Take 1 capsule (30 mg total) by mouth at bedtime as needed for sleep. 30 capsule 4   TRASTUZUMAB IV Inject into the vein every 21 ( twenty-one) days.     Triamcinolone Acetonide (NASACORT ALLERGY 24HR NA) Place 1 spray into the nose daily.     citalopram (CELEXA) 10 MG tablet Take 1 tablet (10 mg total) by mouth daily. 30 tablet 0   No current facility-administered medications for this visit.   REVIEW OF SYSTEMS:   Constitutional: Denies fevers, chills or abnormal weight loss Eyes: Denies blurriness of vision Ears, nose, mouth, throat, and face: Denies mucositis or sore throat Respiratory: Denies cough, dyspnea or wheezes Cardiovascular: Denies palpitation, chest discomfort or lower extremity swelling Gastrointestinal:  Denies nausea, heartburn or change in bowel habits Skin: Denies abnormal skin rashes Lymphatics: Denies new lymphadenopathy or easy bruising Neurological:Denies numbness, tingling or new weaknesses Behavioral/Psych: Mood is stable, no new changes  All other systems were reviewed with the patient and are negative.   VITALS:  Blood pressure 115/67, pulse 87, temperature 98 F (36.7 C), temperature source Tympanic, resp. rate 18, weight 124 lb 12.8 oz (56.6 kg), SpO2 96%.  Wt Readings from Last 3 Encounters:  05/14/23 124 lb 12.8 oz (56.6 kg)  04/24/23 122 lb 5.7 oz (55.5 kg)  04/03/23 122 lb 9.2 oz (55.6 kg)    Body mass index is 20.77 kg/m.  Performance status (ECOG): 1 - Symptomatic but completely ambulatory  PHYSICAL EXAM:   GENERAL:alert, no distress and comfortable LUNGS: clear to auscultation and percussion with normal breathing effort BREAST: No abnormal lumps palpated, no discharge.  Left breast with lumpectomy scar, right breast with  previous breast biopsy scar.  No palpable axillary lymphadenopathy HEART: regular rate & rhythm and no murmurs and no lower extremity edema ABDOMEN:abdomen soft, non-tender and normal bowel sounds Musculoskeletal:no cyanosis of digits and no clubbing  NEURO: alert & oriented x 3 with fluent speech  LABORATORY DATA:  I have reviewed the data as listed  Lab Results  Component Value Date   WBC 12.2 (H) 05/14/2023   NEUTROABS 10.5 (H) 05/14/2023   HGB 13.3 05/14/2023   HCT 39.7 05/14/2023   MCV 95.7 05/14/2023   PLT 278 05/14/2023      Chemistry  Component Value Date/Time   NA 132 (L) 05/14/2023 1216   NA 136 10/14/2019 1155   K 3.5 05/14/2023 1216   CL 94 (L) 05/14/2023 1216   CO2 27 05/14/2023 1216   BUN 17 05/14/2023 1216   BUN 13 10/14/2019 1155   CREATININE 0.57 05/14/2023 1216   CREATININE 0.59 12/19/2022 1114   CREATININE 0.71 08/16/2019 1518      Component Value Date/Time   CALCIUM 8.8 (L) 05/14/2023 1216   CALCIUM 9.7 10/07/2011 0919   ALKPHOS 42 05/14/2023 1216   AST 19 05/14/2023 1216   AST 18 12/19/2022 1114   ALT 13 05/14/2023 1216   ALT 12 12/19/2022 1114   BILITOT 0.3 05/14/2023 1216   BILITOT 0.4 12/19/2022 1114

## 2023-05-14 NOTE — Progress Notes (Signed)
 Patient presents today for Kanjinti per provider's order. Treatment will be held today due to recent dental procedure patient has had.

## 2023-05-14 NOTE — Patient Instructions (Signed)
 VISIT SUMMARY:  Today, we discussed your recent symptoms of fatigue, hot flashes, and poor sleep following your dental surgery. We reviewed your current medications and made some adjustments to your treatment plan to help manage your symptoms and support your recovery.  YOUR PLAN:  -BREAST CANCER: Breast cancer is a type of cancer that develops from breast tissue. We have temporarily stopped your tamoxifen due to your recent dental surgery and the associated infection risk. We will resume tamoxifen after evaluating the effect of Celexa on your hot flashes. Your infusion schedule has been adjusted to every third cycle for blood work and visits.  -FATIGUE AND HOT FLASHES: Fatigue and hot flashes are likely due to stopping tamoxifen. We discussed starting Celexa to help manage your hot flashes. We will reassess your symptoms in two months to see if there is any improvement.  -DENTAL SURGERY AND INFLAMMATION: You recently had dental surgery, which involved cutting across the gum, scraping the bone, and performing a bone graft. To prevent infection, you are taking amoxicillin. We will delay your infusion therapy until at least ten days post-surgery and schedule it after April 25th. Please continue taking amoxicillin as prescribed.  -HYPONATREMIA: Hyponatremia is a condition where your blood sodium levels are lower than normal. We will monitor your sodium levels in subsequent lab tests to ensure they return to normal.  INSTRUCTIONS:  Please start taking Celexa as discussed to help manage your hot flashes. We will reassess your symptoms in two months. Continue taking amoxicillin as prescribed to prevent infection from your dental surgery. Your next infusion therapy will be scheduled after April 25th. We will also monitor your sodium levels in your upcoming lab tests.

## 2023-05-15 ENCOUNTER — Other Ambulatory Visit: Payer: Self-pay

## 2023-05-15 ENCOUNTER — Inpatient Hospital Stay: Admitting: Oncology

## 2023-05-15 ENCOUNTER — Inpatient Hospital Stay

## 2023-05-22 ENCOUNTER — Encounter: Payer: Self-pay | Admitting: Hematology and Oncology

## 2023-05-22 ENCOUNTER — Inpatient Hospital Stay

## 2023-05-22 VITALS — BP 119/66 | HR 78 | Temp 98.3°F | Resp 18 | Wt 122.8 lb

## 2023-05-22 DIAGNOSIS — Z170 Estrogen receptor positive status [ER+]: Secondary | ICD-10-CM

## 2023-05-22 DIAGNOSIS — Z5112 Encounter for antineoplastic immunotherapy: Secondary | ICD-10-CM | POA: Diagnosis not present

## 2023-05-22 MED ORDER — ACETAMINOPHEN 325 MG PO TABS
650.0000 mg | ORAL_TABLET | Freq: Once | ORAL | Status: AC
  Administered 2023-05-22: 650 mg via ORAL
  Filled 2023-05-22: qty 2

## 2023-05-22 MED ORDER — TRASTUZUMAB-ANNS CHEMO 150 MG IV SOLR
6.0000 mg/kg | Freq: Once | INTRAVENOUS | Status: AC
  Administered 2023-05-22: 336 mg via INTRAVENOUS
  Filled 2023-05-22: qty 16

## 2023-05-22 MED ORDER — SODIUM CHLORIDE 0.9 % IV SOLN: Freq: Once | INTRAVENOUS | Status: AC

## 2023-05-22 MED ORDER — CETIRIZINE HCL 10 MG PO TABS
10.0000 mg | ORAL_TABLET | Freq: Once | ORAL | Status: AC
  Administered 2023-05-22: 10 mg via ORAL
  Filled 2023-05-22: qty 1

## 2023-05-22 NOTE — Patient Instructions (Signed)
 [facility] [place] - A DEPT OF [facility]  Discharge Instructions: Thank you for choosing [facility] to provide your oncology and hematology care.  If you have a lab appointment with the Cancer Center - please note that after April 8th, 2024, all labs will be drawn in the cancer center.  You do not have to check in or register with the main entrance as you have in the past but will complete your check-in in the cancer center.  Wear comfortable clothing and clothing appropriate for easy access to any Portacath or PICC line.   We strive to give you quality time with your provider. You may need to reschedule your appointment if you arrive late (15 or more minutes).  Arriving late affects you and other patients whose appointments are after yours.  Also, if you miss three or more appointments without notifying the office, you may be dismissed from the clinic at the provider's discretion.      For prescription refill requests, have your pharmacy contact our office and allow 72 hours for refills to be completed.    Today you received the following chemotherapy and/or immunotherapy agents Kajaniti, return as scheduled.   To help prevent nausea and vomiting after your treatment, we encourage you to take your nausea medication as directed.  BELOW ARE SYMPTOMS THAT SHOULD BE REPORTED IMMEDIATELY: *FEVER GREATER THAN 100.4 F (38 C) OR HIGHER *CHILLS OR SWEATING *NAUSEA AND VOMITING THAT IS NOT CONTROLLED WITH YOUR NAUSEA MEDICATION *UNUSUAL SHORTNESS OF BREATH *UNUSUAL BRUISING OR BLEEDING *URINARY PROBLEMS (pain or burning when urinating, or frequent urination) *BOWEL PROBLEMS (unusual diarrhea, constipation, pain near the anus) TENDERNESS IN MOUTH AND THROAT WITH OR WITHOUT PRESENCE OF ULCERS (sore throat, sores in mouth, or a toothache) UNUSUAL RASH, SWELLING OR PAIN  UNUSUAL VAGINAL DISCHARGE OR ITCHING   Items with * indicate a potential emergency and  should be followed up as soon as possible or go to the Emergency Department if any problems should occur.  Please show the CHEMOTHERAPY ALERT CARD or IMMUNOTHERAPY ALERT CARD at check-in to the Emergency Department and triage nurse.  Should you have questions after your visit or need to cancel or reschedule your appointment, please contact [facility] [place] - A DEPT OF [facility] [phone]  and follow the prompts.  Office hours are 8:00 a.m. to 4:30 p.m. Monday - Friday. Please note that voicemails left after 4:00 p.m. may not be returned until the following business day.  We are closed weekends and major holidays. You have access to a nurse at all times for urgent questions. Please call the main number to the clinic [phone] and follow the prompts.  For any non-urgent questions, you may also contact your provider using MyChart. We now offer e-Visits for anyone 56 and older to request care online for non-urgent symptoms. For details visit [email or web address].   Also download the MyChart app! Go to the app store, search "MyChart", open the app, select [place], and log in with your MyChart username and password.

## 2023-05-22 NOTE — Progress Notes (Signed)
 Patient presents today for Kanjinti , Patient reports her mouth feels a lot better but is still a little sore and oral intake is still affected, Dr. Orvis Blare made aware. Dr. Orvis Blare assessed patient and patient is okay for treatment per Dr. Orvis Blare. Patient tolerated chemotherapy with no complaints voiced. Side effects with management reviewed understanding verbalized. IV site clean and dry with no bruising or swelling noted at site. Good blood return noted before and after administration of chemotherapy. Band aid applied. Patient left in satisfactory condition with VSS and no s/s of distress noted.

## 2023-06-03 ENCOUNTER — Other Ambulatory Visit: Payer: Self-pay

## 2023-06-04 ENCOUNTER — Inpatient Hospital Stay: Admitting: Oncology

## 2023-06-04 ENCOUNTER — Inpatient Hospital Stay

## 2023-06-05 ENCOUNTER — Inpatient Hospital Stay

## 2023-06-05 ENCOUNTER — Inpatient Hospital Stay: Admitting: Oncology

## 2023-06-11 ENCOUNTER — Inpatient Hospital Stay

## 2023-06-11 ENCOUNTER — Inpatient Hospital Stay: Attending: Licensed Clinical Social Worker | Admitting: Oncology

## 2023-06-11 VITALS — BP 124/71 | HR 85 | Temp 98.0°F | Resp 18

## 2023-06-11 VITALS — BP 116/66 | HR 88 | Temp 98.0°F | Resp 18 | Ht 65.0 in | Wt 123.4 lb

## 2023-06-11 DIAGNOSIS — Z170 Estrogen receptor positive status [ER+]: Secondary | ICD-10-CM | POA: Insufficient documentation

## 2023-06-11 DIAGNOSIS — C50412 Malignant neoplasm of upper-outer quadrant of left female breast: Secondary | ICD-10-CM | POA: Diagnosis not present

## 2023-06-11 DIAGNOSIS — Z1722 Progesterone receptor negative status: Secondary | ICD-10-CM | POA: Diagnosis not present

## 2023-06-11 DIAGNOSIS — F329 Major depressive disorder, single episode, unspecified: Secondary | ICD-10-CM

## 2023-06-11 DIAGNOSIS — Z1731 Human epidermal growth factor receptor 2 positive status: Secondary | ICD-10-CM | POA: Diagnosis not present

## 2023-06-11 DIAGNOSIS — C50912 Malignant neoplasm of unspecified site of left female breast: Secondary | ICD-10-CM

## 2023-06-11 DIAGNOSIS — Z5112 Encounter for antineoplastic immunotherapy: Secondary | ICD-10-CM | POA: Insufficient documentation

## 2023-06-11 DIAGNOSIS — M810 Age-related osteoporosis without current pathological fracture: Secondary | ICD-10-CM

## 2023-06-11 DIAGNOSIS — F32A Depression, unspecified: Secondary | ICD-10-CM | POA: Diagnosis not present

## 2023-06-11 MED ORDER — ACETAMINOPHEN 325 MG PO TABS
650.0000 mg | ORAL_TABLET | Freq: Once | ORAL | Status: AC
  Administered 2023-06-11: 650 mg via ORAL
  Filled 2023-06-11: qty 2

## 2023-06-11 MED ORDER — TRASTUZUMAB-ANNS CHEMO 150 MG IV SOLR
6.0000 mg/kg | Freq: Once | INTRAVENOUS | Status: AC
  Administered 2023-06-11: 336 mg via INTRAVENOUS
  Filled 2023-06-11: qty 16

## 2023-06-11 MED ORDER — SODIUM CHLORIDE 0.9 % IV SOLN: Freq: Once | INTRAVENOUS | Status: AC

## 2023-06-11 MED ORDER — CETIRIZINE HCL 10 MG PO TABS
10.0000 mg | ORAL_TABLET | Freq: Once | ORAL | Status: AC
Start: 2023-06-11 — End: 2023-06-11
  Administered 2023-06-11: 10 mg via ORAL
  Filled 2023-06-11: qty 1

## 2023-06-11 NOTE — Patient Instructions (Signed)
 VISIT SUMMARY:  Today, we discussed your ongoing treatment for left-sided breast cancer and addressed your symptoms, including soreness and hot flashes. We also reviewed your upcoming mammogram and potential resumption of tamoxifen  therapy.  YOUR PLAN:  -BREAST CANCER, LEFT SIDE: Breast cancer is a type of cancer that forms in the cells of the breasts. We discussed the possibility of resuming tamoxifen  next month to help manage your condition. Your mammogram is scheduled for July, and we will continue to monitor your progress with follow-up appointments every other treatment.  -HOT FLASHES: Hot flashes are sudden feelings of warmth, often over the face, neck, and chest, which can cause sweating. Your hot flashes have improved since stopping tamoxifen , but they are still present. We will reassess your hot flashes if you decide to resume tamoxifen  next month.  INSTRUCTIONS:  Please ensure you attend your mammogram appointment in July in [place]. We will continue to see you for follow-up appointments every other treatment to monitor your progress. If you decide to resume tamoxifen , we will reassess your hot flashes at that time.

## 2023-06-11 NOTE — Patient Instructions (Signed)
 [facility] [place] - [facility]  Discharge Instructions: Thank you for choosing [facility] to provide your oncology and hematology care.  If you have a lab appointment with the Cancer Center - please note that after April 8th, 2024, all labs will be drawn in the cancer center.  You do not have to check in or register with the main entrance as you have in the past but will complete your check-in in the cancer center.  Wear comfortable clothing and clothing appropriate for easy access to any Portacath or PICC line.   We strive to give you quality time with your provider. You may need to reschedule your appointment if you arrive late (15 or more minutes).  Arriving late affects you and other patients whose appointments are after yours.  Also, if you miss three or more appointments without notifying the office, you may be dismissed from the clinic at the provider's discretion.      For prescription refill requests, have your pharmacy contact our office and allow 72 hours for refills to be completed.    Today you received the following chemotherapy and/or immunotherapy agents enhertu      To help prevent nausea and vomiting after your treatment, we encourage you to take your nausea medication as directed.  BELOW ARE SYMPTOMS THAT SHOULD BE REPORTED IMMEDIATELY: *FEVER GREATER THAN 100.4 F (38 C) OR HIGHER *CHILLS OR SWEATING *NAUSEA AND VOMITING THAT IS NOT CONTROLLED WITH YOUR NAUSEA MEDICATION *UNUSUAL SHORTNESS OF BREATH *UNUSUAL BRUISING OR BLEEDING *URINARY PROBLEMS (pain or burning when urinating, or frequent urination) *BOWEL PROBLEMS (unusual diarrhea, constipation, pain near the anus) TENDERNESS IN MOUTH AND THROAT WITH OR WITHOUT PRESENCE OF ULCERS (sore throat, sores in mouth, or a toothache) UNUSUAL RASH, SWELLING OR PAIN  UNUSUAL VAGINAL DISCHARGE OR ITCHING   Items with * indicate a potential emergency and should be followed up  as soon as possible or go to the Emergency Department if any problems should occur.  Please show the CHEMOTHERAPY ALERT CARD or IMMUNOTHERAPY ALERT CARD at check-in to the Emergency Department and triage nurse.  Should you have questions after your visit or need to cancel or reschedule your appointment, please contact [facility] [place] - A DEPT OF [facility] [phone]  and follow the prompts.  Office hours are 8:00 a.m. to 4:30 p.m. Monday - Friday. Please note that voicemails left after 4:00 p.m. may not be returned until the following business day.  We are closed weekends and major holidays. You have access to a nurse at all times for urgent questions. Please call the main number to the clinic [phone] and follow the prompts.  For any non-urgent questions, you may also contact your provider using MyChart. We now offer e-Visits for anyone 59 and older to request care online for non-urgent symptoms. For details visit [email or web address].   Also download the MyChart app! Go to the app store, search "MyChart", open the app, select [place], and log in with your MyChart username and password.

## 2023-06-11 NOTE — Assessment & Plan Note (Signed)
 Patient has been feeling low since a diagnosis and starting of treatment.   Social worker helping with psychosocial support.   Patient does not feel depressed anymore.   She is not taking Celexa.  -Continue to monitor

## 2023-06-11 NOTE — Progress Notes (Signed)
Patient tolerated therapy with no complaints voiced.  Side effects with management reviewed with understanding verbalized.  Peripheral IV site clean and dry with no bruising or swelling noted at site.  Good blood return noted before and after administration of therapy.  Band aid applied.  Patient left in satisfactory condition with VSS and no s/s of distress noted.

## 2023-06-11 NOTE — Progress Notes (Signed)
 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  Currently stopped taking tamoxifen  for hot flashes.  Malignant neoplasm of upper-outer quadrant of left breast in female, estrogen receptor positive (HCC) Patient is s/p lumpectomy with a 1.2 cm malignant lesion.  ER positive, HER2 positive.   Patient is currently on trastuzumab  6 mg/kg every 3 weeks maintenance dose along with tamoxifen  10 mg daily [poor tolerability for 20 mg with hot flashes].   Patient was not given chemotherapy adjuvantly for concerns of potential toxicity considering her age. Patient currently stopped taking tamoxifen  on 05/06/2023 attributing it to severe hot flashes.  She is reluctant to try SSRIs for hot flashes.  Discussed risk versus benefits of stopping tamoxifen  including the risk of recurrence.  -Patient wishes to hold tamoxifen  for 1 more months and then rediscuss starting it. -Recent echo from 04/07/2022 showed good EF.  Will repeat at 1 year mark in 6 months. Will require 1 more at the end of treatment. -Plan for mammogram at 1 year mark that is July 2025.  Ordered today - Physical exam stable today.  Okay to get  treatment.  Will obtain labs every 3rd cycle and see the patient prior to the second cycle  Depression Patient has been feeling low since a diagnosis and starting of treatment.   Social worker helping with psychosocial support.   Patient does not feel depressed anymore.   She is not taking Celexa .  -Continue to monitor   Osteoporosis Patient is a history of osteoporosis and has received denosumab  before.  -Considered treatment with tamoxifen  instead of AI for this reason.    The patient understands the plans discussed today and is in agreement with them.  She knows to contact our office if she develops concerns prior to her next appointment.  I provided 20 minutes of face-to-face time during this encounter and > 50% was spent counseling as documented under my assessment and plan.    Eduardo Grade, MD  [facility] [place] - [facility] [address] [place] [address] Dept: [phone] Dept Fax: [phone]    ONCOLOGY HISTORY:   Oncology History  Malignant neoplasm of upper-outer quadrant of left breast in female, estrogen receptor positive (HCC)  08/18/2022 Initial Diagnosis   Screening mammogram detected left breast mass upper central 4 o'clock position: 1.3 cm, axilla negative, biopsy: Grade 2 IDC ER 95% PR 0% Ki67 15%, HER2 3+ positive   08/27/2022 Cancer Staging   Staging form: Breast, AJCC 8th Edition - Clinical stage from 08/27/2022: Stage IA (cT1c, cN0, cM0, G2, ER+, PR-, HER2+) - Signed by Cameron Cea, MD on 08/27/2022 Stage prefix: Initial diagnosis Histologic grading system: 3 grade  system Laterality: Left Staged by: Pathologist and managing physician Stage used in treatment planning: Yes National guidelines used in treatment planning: Yes Type of national guideline used in treatment planning: NCCN   09/18/2022 Surgery   09/18/2022: Left lumpectomy: Grade 2 IDC 1.2 cm with DCIS  intermediate grade, margins negative, LVI not present, ER 100%, PR 0%, HER2 positive by FISH ratio 3, copy #4.95, Ki-67 10%    09/18/2022 Pathology Results   FINAL MICROSCOPIC DIAGNOSIS:   A. BREAST, LEFT, LUMPECTOMY:       Invasive ductal carcinoma with micropapillary features, 1.2 cm, grade 2  Ductal carcinoma in situ:  Cribriform type, intermediate grade, with calcifications  Margins, invasive:  Negative  Lymphovascular invasion:  Not identified   Prognostic markers:  ER: 95%, positive, strong staining intensity  PR: 0%, negative  Her2: Positive by FISH  Ki-67: 15%    10/17/2022 Imaging   Echo: Left ventricular ejection fraction, by estimation, is 65 to 70%    11/07/2022 -  Chemotherapy   -Tamoxifen  10mg  daily for 5 years. Dose reduced from 20mg  daily for hot flashes. Patient is on Treatment Plan : BREAST MAINTENANCE Trastuzumab  IV (6) or SQ (600) D1 q21d x 13 cycles      11/10/2022 - 11/14/2022 Radiation Therapy   Plan Name: Breast_L Site: Breast, Left Technique: 3D Mode: Photon Dose Per Fraction: 5.2 Gy Prescribed Dose (Delivered / Prescribed): 26 Gy / 26 Gy Prescribed Fxs (Delivered / Prescribed): 5 / 5   01/05/2023 Imaging   Echo: Left ventricular ejection fraction, by estimation, is 60 to 65%.    04/07/2023 Imaging   ECHO:  EF: 55-60%. Left ventricular diastolic parameters are consistent with Grade I diastolic  dysfunction        Current Treatment:  Trastuzumab  maintenance plus tamoxifen    INTERVAL HISTORY:  Michelle Hubbard is here today for follow up. Patient is accompanied by her friend today.  She reports occasional hot flashes that did not resolve with holding tamoxifen .  She has no other complaints and is doing very well.  She is healing from her dental procedure and does not have pain or swelling anymore.   I have reviewed the past medical history, past surgical history, social history and family history with the patient and they are unchanged from  previous note.  ALLERGIES:  is allergic to codeine, lortab [hydrocodone -acetaminophen ], and penicillins.  MEDICATIONS:  Current Outpatient Medications  Medication Sig Dispense Refill   Acetaminophen  (TYLENOL  PO) Take 500 mg by mouth daily as needed.     amLODipine  (NORVASC ) 5 MG tablet Take 1 tablet (5 mg total) by mouth daily. 90 tablet 3   aspirin  EC 81 MG tablet Take by mouth.     Cholecalciferol (VITAMIN D3) 50 MCG (2000 UT) TABS Take 50 mcg by mouth daily.     Coenzyme Q10 (COQ10) 100 MG CAPS Take 3 capsules by mouth daily. 30 capsule    diazepam (VALIUM) 2 MG tablet Take 2 mg by mouth 3 (three) times daily as needed.     FLUZONE HIGH-DOSE 0.5 ML injection      levocetirizine (XYZAL ) 5 MG tablet Take 5 mg by mouth every evening.     metoprolol  succinate (TOPROL -XL) 25 MG 24 hr tablet TAKE 1/2 (ONE-HALF) TABLET BY MOUTH ONCE DAILY WITH OR IMMEDIATELY FOLLOWING A MEAL 45 tablet 3   nitroGLYCERIN  (NITROSTAT ) 0.4 MG SL tablet DISSOLVE ONE TABLET UNDER THE TONGUE EVERY 5 MINUTES AS NEEDED FOR CHEST PAIN.  DO NOT EXCEED A TOTAL  OF 3 DOSES IN 15 MINUTES 25 tablet 3   omeprazole  (PRILOSEC) 20 MG capsule Take 20 mg by mouth daily. Takes once every 3 days     polyethylene glycol (MIRALAX / GLYCOLAX) 17 g packet Take 17 g by mouth daily.     rosuvastatin  (CRESTOR ) 20 MG tablet Take 1 tablet (20 mg total) by mouth daily. 90 tablet 3   temazepam  (RESTORIL ) 30 MG capsule Take 1 capsule (30 mg total) by mouth at bedtime as needed for sleep. 30 capsule 4   TRASTUZUMAB  IV Inject into the vein every 21 ( twenty-one) days.     Triamcinolone Acetonide (NASACORT ALLERGY 24HR NA) Place 1 spray into the nose daily.     citalopram  (CELEXA ) 10 MG tablet Take 1 tablet (10 mg total) by mouth daily. 30 tablet 0   No current facility-administered medications for this visit.   REVIEW OF SYSTEMS:   Constitutional: Denies fevers, chills or abnormal weight loss Eyes: Denies blurriness of vision Ears, nose, mouth,  throat, and face: Denies mucositis or sore throat Respiratory: Denies cough, dyspnea or wheezes Cardiovascular: Denies palpitation, chest discomfort or lower extremity swelling Gastrointestinal:  Denies nausea, heartburn or change in bowel habits Skin: Denies abnormal skin rashes Lymphatics: Denies new lymphadenopathy or easy bruising Neurological:Denies numbness, tingling or new weaknesses Behavioral/Psych: Mood is stable, no new changes  All other systems were reviewed with the patient and are negative.   VITALS:  Blood pressure 116/66, pulse 88, temperature 98 F (36.7 C), temperature source Oral, resp. rate 18, height 5\' 5"  (1.651 m), weight 123 lb 6.4 oz (56 kg), SpO2 91%.  Wt Readings from Last 3 Encounters:  06/11/23 123 lb 6.4 oz (56 kg)  05/22/23 122 lb 12.8 oz (55.7 kg)  05/14/23 124 lb 12.8 oz (56.6 kg)    Body mass index is 20.53 kg/m.  Performance status (ECOG): 1 - Symptomatic but completely ambulatory  PHYSICAL EXAM:   GENERAL:alert, no distress and comfortable LUNGS: clear to auscultation and percussion with normal breathing effort BREAST: No abnormal lumps palpated, no discharge.  Left breast with lumpectomy scar, right breast with previous breast biopsy scar.  No palpable axillary lymphadenopathy HEART: regular rate & rhythm and no murmurs and no lower extremity edema ABDOMEN:abdomen soft, non-tender and normal bowel sounds Musculoskeletal:no cyanosis of digits and no clubbing  NEURO: alert & oriented x 3 with fluent speech  LABORATORY DATA:  I have reviewed the data as listed  Lab Results  Component Value Date   WBC 12.2 (H) 05/14/2023   NEUTROABS 10.5 (H) 05/14/2023   HGB 13.3 05/14/2023   HCT 39.7 05/14/2023   MCV 95.7 05/14/2023   PLT 278 05/14/2023      Chemistry      Component Value Date/Time   NA 132 (L) 05/14/2023 1216   NA 136 10/14/2019 1155   K 3.5 05/14/2023 1216   CL 94 (L) 05/14/2023 1216   CO2 27 05/14/2023 1216   BUN 17  05/14/2023 1216   BUN 13 10/14/2019 1155   CREATININE 0.57 05/14/2023 1216   CREATININE 0.59 12/19/2022 1114   CREATININE 0.71 08/16/2019 1518      Component Value Date/Time   CALCIUM  8.8 (L) 05/14/2023 1216   CALCIUM  9.7 10/07/2011 0919   ALKPHOS 42 05/14/2023 1216   AST 19 05/14/2023 1216   AST 18 12/19/2022 1114   ALT 13 05/14/2023 1216   ALT 12 12/19/2022 1114   BILITOT 0.3 05/14/2023 1216   BILITOT 0.4 12/19/2022 1114

## 2023-06-11 NOTE — Assessment & Plan Note (Addendum)
 Patient is s/p lumpectomy with a 1.2 cm malignant lesion.  ER positive, HER2 positive.   Patient is currently on trastuzumab  6 mg/kg every 3 weeks maintenance dose along with tamoxifen  10 mg daily [poor tolerability for 20 mg with hot flashes].   Patient was not given chemotherapy adjuvantly for concerns of potential toxicity considering her age. Patient currently stopped taking tamoxifen  on 05/06/2023 attributing it to severe hot flashes.  She is reluctant to try SSRIs for hot flashes.  Discussed risk versus benefits of stopping tamoxifen  including the risk of recurrence.  -Patient wishes to hold tamoxifen  for 1 more months and then rediscuss starting it. -Recent echo from 04/07/2022 showed good EF.  Will repeat at 1 year mark in 6 months. Will require 1 more at the end of treatment. -Plan for mammogram at 1 year mark that is July 2025.  Ordered today - Physical exam stable today.  Okay to get treatment.  Will obtain labs every 3rd cycle and see the patient prior to the second cycle

## 2023-06-11 NOTE — Assessment & Plan Note (Signed)
 Patient is a history of osteoporosis and has received denosumab before. -Considered treatment with tamoxifen instead of AI for this reason.

## 2023-06-12 ENCOUNTER — Other Ambulatory Visit: Payer: Self-pay

## 2023-06-12 ENCOUNTER — Inpatient Hospital Stay: Attending: Licensed Clinical Social Worker | Admitting: Oncology

## 2023-06-12 ENCOUNTER — Inpatient Hospital Stay

## 2023-06-25 ENCOUNTER — Other Ambulatory Visit: Payer: Self-pay | Admitting: Hematology and Oncology

## 2023-06-26 ENCOUNTER — Other Ambulatory Visit: Payer: Self-pay | Admitting: Oncology

## 2023-07-02 ENCOUNTER — Inpatient Hospital Stay: Attending: Oncology

## 2023-07-02 ENCOUNTER — Inpatient Hospital Stay

## 2023-07-02 ENCOUNTER — Inpatient Hospital Stay: Admitting: Oncology

## 2023-07-02 VITALS — BP 127/65 | HR 84 | Temp 97.8°F | Resp 18 | Wt 123.3 lb

## 2023-07-02 DIAGNOSIS — M810 Age-related osteoporosis without current pathological fracture: Secondary | ICD-10-CM | POA: Insufficient documentation

## 2023-07-02 DIAGNOSIS — Z5112 Encounter for antineoplastic immunotherapy: Secondary | ICD-10-CM | POA: Diagnosis present

## 2023-07-02 DIAGNOSIS — C50412 Malignant neoplasm of upper-outer quadrant of left female breast: Secondary | ICD-10-CM | POA: Diagnosis not present

## 2023-07-02 DIAGNOSIS — Z170 Estrogen receptor positive status [ER+]: Secondary | ICD-10-CM | POA: Diagnosis not present

## 2023-07-02 DIAGNOSIS — Z1722 Progesterone receptor negative status: Secondary | ICD-10-CM | POA: Diagnosis not present

## 2023-07-02 DIAGNOSIS — Z1731 Human epidermal growth factor receptor 2 positive status: Secondary | ICD-10-CM | POA: Insufficient documentation

## 2023-07-02 MED ORDER — SODIUM CHLORIDE 0.9 % IV SOLN: Freq: Once | INTRAVENOUS | Status: AC

## 2023-07-02 MED ORDER — TRASTUZUMAB-ANNS CHEMO 150 MG IV SOLR
6.0000 mg/kg | Freq: Once | INTRAVENOUS | Status: AC
  Administered 2023-07-02: 336 mg via INTRAVENOUS
  Filled 2023-07-02: qty 16

## 2023-07-02 NOTE — Progress Notes (Signed)
 Patient presents today for Kanjinti , patient reports she took pre-meds at home. Patient tolerated therapy with no complaints voiced. Side effects with management reviewed with understanding verbalized. Port site clean and dry with no bruising or swelling noted at site. Good blood return noted before and after administration of therapy. Band aid applied. Patient left in satisfactory condition with VSS and no s/s of distress noted.

## 2023-07-02 NOTE — Progress Notes (Signed)
 Premedication reported as being taken at home prior to arrival.  Augie Bliss, PharmD

## 2023-07-02 NOTE — Patient Instructions (Signed)
 [facility] [place] - A DEPT OF [facility]  Discharge Instructions: Thank you for choosing [facility] to provide your oncology and hematology care.  If you have a lab appointment with the Cancer Center - please note that after April 8th, 2024, all labs will be drawn in the cancer center.  You do not have to check in or register with the main entrance as you have in the past but will complete your check-in in the cancer center.  Wear comfortable clothing and clothing appropriate for easy access to any Portacath or PICC line.   We strive to give you quality time with your provider. You may need to reschedule your appointment if you arrive late (15 or more minutes).  Arriving late affects you and other patients whose appointments are after yours.  Also, if you miss three or more appointments without notifying the office, you may be dismissed from the clinic at the provider's discretion.      For prescription refill requests, have your pharmacy contact our office and allow 72 hours for refills to be completed.    Today you received the following chemotherapy and/or immunotherapy agents Kajinti, return as scheduled.   To help prevent nausea and vomiting after your treatment, we encourage you to take your nausea medication as directed.  BELOW ARE SYMPTOMS THAT SHOULD BE REPORTED IMMEDIATELY: *FEVER GREATER THAN 100.4 F (38 C) OR HIGHER *CHILLS OR SWEATING *NAUSEA AND VOMITING THAT IS NOT CONTROLLED WITH YOUR NAUSEA MEDICATION *UNUSUAL SHORTNESS OF BREATH *UNUSUAL BRUISING OR BLEEDING *URINARY PROBLEMS (pain or burning when urinating, or frequent urination) *BOWEL PROBLEMS (unusual diarrhea, constipation, pain near the anus) TENDERNESS IN MOUTH AND THROAT WITH OR WITHOUT PRESENCE OF ULCERS (sore throat, sores in mouth, or a toothache) UNUSUAL RASH, SWELLING OR PAIN  UNUSUAL VAGINAL DISCHARGE OR ITCHING   Items with * indicate a potential emergency and  should be followed up as soon as possible or go to the Emergency Department if any problems should occur.  Please show the CHEMOTHERAPY ALERT CARD or IMMUNOTHERAPY ALERT CARD at check-in to the Emergency Department and triage nurse.  Should you have questions after your visit or need to cancel or reschedule your appointment, please contact [facility] [address] - A DEPT OF [facility] [phone]  and follow the prompts.  Office hours are 8:00 a.m. to 4:30 p.m. Monday - Friday. Please note that voicemails left after 4:00 p.m. may not be returned until the following business day.  We are closed weekends and major holidays. You have access to a nurse at all times for urgent questions. Please call the main number to the clinic [phone] and follow the prompts.  For any non-urgent questions, you may also contact your provider using MyChart. We now offer e-Visits for anyone 61 and older to request care online for non-urgent symptoms. For details visit [email or web address].   Also download the MyChart app! Go to the app store, search "MyChart", open the app, select [place], and log in with your MyChart username and password.

## 2023-07-03 ENCOUNTER — Inpatient Hospital Stay

## 2023-07-03 ENCOUNTER — Inpatient Hospital Stay: Admitting: Oncology

## 2023-07-22 ENCOUNTER — Other Ambulatory Visit: Payer: Self-pay

## 2023-07-23 ENCOUNTER — Inpatient Hospital Stay

## 2023-07-23 ENCOUNTER — Encounter: Payer: Self-pay | Admitting: Oncology

## 2023-07-23 ENCOUNTER — Inpatient Hospital Stay: Admitting: Oncology

## 2023-07-23 VITALS — BP 142/79 | HR 81 | Temp 97.5°F | Resp 18 | Wt 120.6 lb

## 2023-07-23 VITALS — BP 127/65 | HR 72 | Temp 96.7°F | Resp 18

## 2023-07-23 DIAGNOSIS — M810 Age-related osteoporosis without current pathological fracture: Secondary | ICD-10-CM | POA: Diagnosis not present

## 2023-07-23 DIAGNOSIS — C50412 Malignant neoplasm of upper-outer quadrant of left female breast: Secondary | ICD-10-CM | POA: Diagnosis not present

## 2023-07-23 DIAGNOSIS — C50919 Malignant neoplasm of unspecified site of unspecified female breast: Secondary | ICD-10-CM | POA: Diagnosis not present

## 2023-07-23 DIAGNOSIS — F329 Major depressive disorder, single episode, unspecified: Secondary | ICD-10-CM | POA: Diagnosis not present

## 2023-07-23 DIAGNOSIS — Z170 Estrogen receptor positive status [ER+]: Secondary | ICD-10-CM

## 2023-07-23 DIAGNOSIS — R110 Nausea: Secondary | ICD-10-CM

## 2023-07-23 DIAGNOSIS — Z5112 Encounter for antineoplastic immunotherapy: Secondary | ICD-10-CM | POA: Diagnosis not present

## 2023-07-23 LAB — IRON AND TIBC
Iron: 109 ug/dL (ref 28–170)
Saturation Ratios: 25 % (ref 10.4–31.8)
TIBC: 444 ug/dL (ref 250–450)
UIBC: 335 ug/dL

## 2023-07-23 LAB — COMPREHENSIVE METABOLIC PANEL WITH GFR
ALT: 21 U/L (ref 0–44)
AST: 21 U/L (ref 15–41)
Albumin: 4.1 g/dL (ref 3.5–5.0)
Alkaline Phosphatase: 50 U/L (ref 38–126)
Anion gap: 10 (ref 5–15)
BUN: 13 mg/dL (ref 8–23)
CO2: 27 mmol/L (ref 22–32)
Calcium: 9.2 mg/dL (ref 8.9–10.3)
Chloride: 99 mmol/L (ref 98–111)
Creatinine, Ser: 0.56 mg/dL (ref 0.44–1.00)
GFR, Estimated: >60 mL/min (ref >60)
Glucose, Bld: 105 mg/dL — ABNORMAL HIGH (ref 70–99)
Potassium: 4.5 mmol/L (ref 3.5–5.1)
Sodium: 136 mmol/L (ref 135–145)
Total Bilirubin: 0.3 mg/dL (ref 0.0–1.2)
Total Protein: 7.7 g/dL (ref 6.5–8.1)

## 2023-07-23 LAB — CBC WITH DIFFERENTIAL/PLATELET
Abs Immature Granulocytes: 0.01 10*3/uL (ref 0.00–0.07)
Basophils Absolute: 0.1 10*3/uL (ref 0.0–0.1)
Basophils Relative: 1 %
Eosinophils Absolute: 0.1 10*3/uL (ref 0.0–0.5)
Eosinophils Relative: 2 %
HCT: 45.8 % (ref 36.0–46.0)
Hemoglobin: 15 g/dL (ref 12.0–15.0)
Immature Granulocytes: 0 %
Lymphocytes Relative: 16 %
Lymphs Abs: 1 10*3/uL (ref 0.7–4.0)
MCH: 31.6 pg (ref 26.0–34.0)
MCHC: 32.8 g/dL (ref 30.0–36.0)
MCV: 96.6 fL (ref 80.0–100.0)
Monocytes Absolute: 0.8 10*3/uL (ref 0.1–1.0)
Monocytes Relative: 12 %
Neutro Abs: 4.2 10*3/uL (ref 1.7–7.7)
Neutrophils Relative %: 69 %
Platelets: 326 10*3/uL (ref 150–400)
RBC: 4.74 MIL/uL (ref 3.87–5.11)
RDW: 12.7 % (ref 11.5–15.5)
WBC: 6.1 10*3/uL (ref 4.0–10.5)
nRBC: 0 % (ref 0.0–0.2)

## 2023-07-23 LAB — VITAMIN B12: Vitamin B-12: 435 pg/mL (ref 180–914)

## 2023-07-23 LAB — FOLATE: Folate: 11.3 ng/mL (ref >5.9)

## 2023-07-23 LAB — FERRITIN: Ferritin: 41 ng/mL (ref 11–307)

## 2023-07-23 MED ORDER — SODIUM CHLORIDE 0.9 % IV SOLN: Freq: Once | INTRAVENOUS | Status: AC

## 2023-07-23 MED ORDER — TRASTUZUMAB-ANNS CHEMO 150 MG IV SOLR
6.0000 mg/kg | Freq: Once | INTRAVENOUS | Status: AC
  Administered 2023-07-23: 336 mg via INTRAVENOUS
  Filled 2023-07-23: qty 16

## 2023-07-23 MED ORDER — PROCHLORPERAZINE MALEATE 10 MG PO TABS: 10.0000 mg | ORAL_TABLET | Freq: Three times a day (TID) | ORAL | 0 refills | Status: DC | PRN

## 2023-07-23 NOTE — Progress Notes (Signed)
 Patient took own premeds from home.   Patient tolerated chemotherapy with no complaints voiced.  Side effects with management reviewed with understanding verbalized.  Peripheral IV site clean and dry with no bruising or swelling noted at site.  Good blood return noted before and after administration of chemotherapy.  Band aid applied.  Patient left in satisfactory condition with VSS and no s/s of distress noted.

## 2023-07-23 NOTE — Patient Instructions (Signed)
 [facility] [place] - [facility]  Discharge Instructions: Thank you for choosing [facility] to provide your oncology and hematology care.  If you have a lab appointment with the Cancer Center - please note that after April 8th, 2024, all labs will be drawn in the cancer center.  You do not have to check in or register with the main entrance as you have in the past but will complete your check-in in the cancer center.  Wear comfortable clothing and clothing appropriate for easy access to any Portacath or PICC line.   We strive to give you quality time with your provider. You may need to reschedule your appointment if you arrive late (15 or more minutes).  Arriving late affects you and other patients whose appointments are after yours.  Also, if you miss three or more appointments without notifying the office, you may be dismissed from the clinic at the provider's discretion.      For prescription refill requests, have your pharmacy contact our office and allow 72 hours for refills to be completed.    Today you received the following chemotherapy and/or immunotherapy agents Kanjinti      To help prevent nausea and vomiting after your treatment, we encourage you to take your nausea medication as directed.  BELOW ARE SYMPTOMS THAT SHOULD BE REPORTED IMMEDIATELY: *FEVER GREATER THAN 100.4 F (38 C) OR HIGHER *CHILLS OR SWEATING *NAUSEA AND VOMITING THAT IS NOT CONTROLLED WITH YOUR NAUSEA MEDICATION *UNUSUAL SHORTNESS OF BREATH *UNUSUAL BRUISING OR BLEEDING *URINARY PROBLEMS (pain or burning when urinating, or frequent urination) *BOWEL PROBLEMS (unusual diarrhea, constipation, pain near the anus) TENDERNESS IN MOUTH AND THROAT WITH OR WITHOUT PRESENCE OF ULCERS (sore throat, sores in mouth, or a toothache) UNUSUAL RASH, SWELLING OR PAIN  UNUSUAL VAGINAL DISCHARGE OR ITCHING   Items with * indicate a potential emergency and should be followed up  as soon as possible or go to the Emergency Department if any problems should occur.  Please show the CHEMOTHERAPY ALERT CARD or IMMUNOTHERAPY ALERT CARD at check-in to the Emergency Department and triage nurse.  Should you have questions after your visit or need to cancel or reschedule your appointment, please contact [facility] CANCER CTR [place] - A DEPT OF Eligha Bridegroom [facility] [phone]  and follow the prompts.  Office hours are 8:00 a.m. to 4:30 p.m. Monday - Friday. Please note that voicemails left after 4:00 p.m. may not be returned until the following business day.  We are closed weekends and major holidays. You have access to a nurse at all times for urgent questions. Please call the main number to the clinic [phone] and follow the prompts.  For any non-urgent questions, you may also contact your provider using MyChart. We now offer e-Visits for anyone 41 and older to request care online for non-urgent symptoms. For details visit [email or web address].   Also download the MyChart app! Go to the app store, search "MyChart", open the app, select [place], and log in with your MyChart username and password.

## 2023-07-24 ENCOUNTER — Inpatient Hospital Stay

## 2023-07-24 ENCOUNTER — Encounter: Payer: Self-pay | Admitting: Hematology and Oncology

## 2023-07-24 NOTE — Assessment & Plan Note (Signed)
 Patient is s/p lumpectomy with a 1.2 cm malignant lesion.  ER positive, HER2 positive.   Patient is currently on trastuzumab  6 mg/kg every 3 weeks maintenance dose along with tamoxifen  10 mg daily [poor tolerability for 20 mg with hot flashes].   Patient was not given chemotherapy adjuvantly for concerns of potential toxicity considering her age. Patient currently stopped taking tamoxifen  on 05/06/2023 attributing it to severe hot flashes.  She is reluctant to try SSRIs for hot flashes.  Discussed risk versus benefits of stopping tamoxifen  including the risk of recurrence.  Patient restarted taking tamoxifen  on 07/06/2023  -Continue tamoxifen  10 mg daily -Recent echo from 04/07/2022 showed good EF.  Will repeat at 1 year mark in 6 months. Will require 1 more at the end of treatment. - Plan for mammogram at 1 year mark that is July 2025.  Scheduled already - Physical exam stable today.  Okay to get treatment. - Labs reviewed today.  Within normal limits  Will obtain labs every 3rd cycle and see the patient at the same time per patient's wishes.

## 2023-07-24 NOTE — Assessment & Plan Note (Signed)
 Patient has nausea without vomiting as a complication of treatment. Zofran  is causing constipation.  Will prescribe Compazine   -Continue antinausea medication as needed. -Continue MiraLAX as needed for constipation [side effect of antinausea medication]

## 2023-07-24 NOTE — Assessment & Plan Note (Signed)
 Patient has been feeling low since a diagnosis and starting of treatment.   Social worker helping with psychosocial support.   Patient does not feel depressed anymore.   She is not taking Celexa.  -Continue to monitor

## 2023-07-24 NOTE — Assessment & Plan Note (Signed)
 Patient is a history of osteoporosis and has received denosumab before. -Considered treatment with tamoxifen instead of AI for this reason.

## 2023-07-24 NOTE — Progress Notes (Signed)
 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  Currently stopped taking tamoxifen  for hot flashes.  Malignant neoplasm of upper-outer quadrant of left breast in female, estrogen receptor positive (HCC) Patient is s/p lumpectomy with a 1.2 cm malignant lesion.  ER positive, HER2 positive.   Patient is currently on trastuzumab  6 mg/kg every 3 weeks maintenance dose along with tamoxifen  10 mg daily [poor tolerability for 20 mg with hot flashes].   Patient was not given chemotherapy adjuvantly for concerns of potential toxicity considering her age. Patient currently stopped taking tamoxifen  on 05/06/2023 attributing it to severe hot flashes.  She is reluctant to try SSRIs for hot flashes.  Discussed risk versus benefits of stopping tamoxifen  including the risk of recurrence.  Patient restarted taking tamoxifen  on 07/06/2023  -Continue tamoxifen  10 mg daily -Recent echo from 04/07/2022 showed good EF.  Will repeat at 1 year mark in 6 months. Will require 1 more at the end of treatment. - Plan for mammogram at 1 year mark that is July 2025.  Scheduled already - Physical exam stable today.  Okay to get  treatment. - Labs reviewed today.  Within normal limits  Will obtain labs every 3rd cycle and see the patient at the same time per patient's wishes.  Depression Patient has been feeling low since a diagnosis and starting of treatment.   Social worker helping with psychosocial support.   Patient does not feel depressed anymore.   She is not taking Celexa .  -Continue to monitor   Osteoporosis Patient is a history of osteoporosis and has received denosumab  before.  -Considered treatment with tamoxifen  instead of AI for this reason.  Nausea without vomiting Patient has nausea without vomiting as a complication of treatment. Zofran  is causing constipation.  Will prescribe Compazine   -Continue antinausea medication as needed. -Continue MiraLAX as needed for constipation [side effect of antinausea medication]   The patient understands the plans discussed today and is in agreement with them.  She knows to contact our office if she develops concerns prior to her next appointment.  I provided 20 minutes of face-to-face time during this encounter and > 50% was spent counseling as documented under my assessment and plan.    Siad Davonna, MD  [facility] [place] - A DEPT OF [facility] [address] [place] [address] Dept: [phone] Dept Fax: [phone]    ONCOLOGY HISTORY:   Oncology History  Malignant neoplasm of upper-outer quadrant of left breast in female, estrogen receptor positive (HCC)  08/18/2022 Initial Diagnosis   Screening mammogram detected left breast mass upper central 4 o'clock position: 1.3 cm, axilla negative, biopsy: Grade 2 IDC ER 95% PR 0% Ki67 15%,  HER2 3+ positive   08/27/2022 Cancer Staging   Staging form: Breast, AJCC 8th Edition - Clinical stage from 08/27/2022: Stage IA (cT1c, cN0, cM0, G2, ER+, PR-, HER2+) - Signed by Odean Potts, MD on 08/27/2022 Stage prefix: Initial  diagnosis Histologic grading system: 3 grade system Laterality: Left Staged by: Pathologist and managing physician Stage used in treatment planning: Yes National guidelines used in treatment planning: Yes Type of national guideline used in treatment planning: NCCN   09/18/2022 Surgery   09/18/2022: Left lumpectomy: Grade 2 IDC 1.2 cm with DCIS intermediate grade, margins negative, LVI not present, ER 100%, PR 0%, HER2 positive by FISH ratio 3, copy #4.95, Ki-67 10%    09/18/2022 Pathology Results   FINAL MICROSCOPIC DIAGNOSIS:   A. BREAST, LEFT, LUMPECTOMY:       Invasive ductal carcinoma with micropapillary features, 1.2 cm, grade 2  Ductal carcinoma in situ:  Cribriform type, intermediate grade, with calcifications  Margins, invasive:  Negative  Lymphovascular invasion:  Not identified   Prognostic markers:  ER: 95%, positive, strong staining intensity  PR: 0%, negative  Her2: Positive by FISH  Ki-67: 15%    10/17/2022 Imaging   Echo: Left ventricular ejection fraction, by estimation, is 65 to 70%    11/07/2022 -  Chemotherapy   -Tamoxifen  10mg  daily for 5 years. Dose reduced from 20mg  daily for hot flashes. Patient is on Treatment Plan : BREAST MAINTENANCE Trastuzumab  IV (6) or SQ (600) D1 q21d x 13 cycles      11/10/2022 - 11/14/2022 Radiation Therapy   Plan Name: Breast_L Site: Breast, Left Technique: 3D Mode: Photon Dose Per Fraction: 5.2 Gy Prescribed Dose (Delivered / Prescribed): 26 Gy / 26 Gy Prescribed Fxs (Delivered / Prescribed): 5 / 5   01/05/2023 Imaging   Echo: Left ventricular ejection fraction, by estimation, is 60 to 65%.    04/07/2023 Imaging   ECHO:  EF: 55-60%. Left ventricular diastolic parameters are consistent with Grade I diastolic  dysfunction        Current Treatment:  Trastuzumab  maintenance plus tamoxifen    INTERVAL HISTORY:  TAVI GAUGHRAN is here today for follow up. Patient is accompanied by her friend today.  She has been  experiencing fatigue and nausea since her last infusion, which occurred approximately one month ago. She describes a persistent lack of energy and a feeling of being 'different' since the infusion. Nausea is particularly prominent in the afternoons, accompanied by two episodes of vertigo and a lack of appetite.  She resumed taking tamoxifen  on June 9th at a half dose and has been experiencing hot flashes. She felt better off tamoxifen  but decided to resume it. She is uncertain if tamoxifen  is contributing to her symptoms. For nausea, she was previously prescribed Zofran  but discontinued it due to constipation. She manages nausea with ginger ale and is considering Compazine    I have reviewed the past medical history, past surgical history, social history and family history with the patient and they are unchanged from previous note.  ALLERGIES:  is allergic to codeine, lortab [hydrocodone -acetaminophen ], and penicillins.  MEDICATIONS:  Current Outpatient Medications  Medication Sig Dispense Refill   Acetaminophen  (TYLENOL  PO) Take 500 mg by mouth daily as needed.     amLODipine  (NORVASC ) 5 MG tablet Take 1 tablet (5 mg total) by mouth daily. 90 tablet 3   aspirin  EC 81 MG tablet Take by mouth.     Cholecalciferol (VITAMIN D3) 50 MCG (2000 UT) TABS Take 50 mcg by mouth daily.  citalopram  (CELEXA ) 10 MG tablet Take 1 tablet (10 mg total) by mouth daily. 30 tablet 0   Coenzyme Q10 (COQ10) 100 MG CAPS Take 3 capsules by mouth daily. 30 capsule    diazepam (VALIUM) 2 MG tablet Take 2 mg by mouth 3 (three) times daily as needed.     FLUZONE HIGH-DOSE 0.5 ML injection      levocetirizine (XYZAL ) 5 MG tablet Take 5 mg by mouth every evening.     metoprolol  succinate (TOPROL -XL) 25 MG 24 hr tablet TAKE 1/2 (ONE-HALF) TABLET BY MOUTH ONCE DAILY WITH OR IMMEDIATELY FOLLOWING A MEAL 45 tablet 3   nitroGLYCERIN  (NITROSTAT ) 0.4 MG SL tablet DISSOLVE ONE TABLET UNDER THE TONGUE EVERY 5 MINUTES AS NEEDED FOR  CHEST PAIN.  DO NOT EXCEED A TOTAL OF 3 DOSES IN 15 MINUTES 25 tablet 3   omeprazole  (PRILOSEC) 20 MG capsule Take 20 mg by mouth daily. Takes once every 3 days     polyethylene glycol (MIRALAX / GLYCOLAX) 17 g packet Take 17 g by mouth daily.     prochlorperazine  (COMPAZINE ) 10 MG tablet Take 1 tablet (10 mg total) by mouth every 8 (eight) hours as needed for nausea or vomiting. 90 tablet 0   rosuvastatin  (CRESTOR ) 20 MG tablet Take 1 tablet (20 mg total) by mouth daily. 90 tablet 3   temazepam  (RESTORIL ) 30 MG capsule Take 1 capsule (30 mg total) by mouth at bedtime as needed for sleep. 30 capsule 4   TRASTUZUMAB  IV Inject into the vein every 21 ( twenty-one) days.     Triamcinolone Acetonide (NASACORT ALLERGY 24HR NA) Place 1 spray into the nose daily.     No current facility-administered medications for this visit.   REVIEW OF SYSTEMS:   Constitutional: Denies fevers, chills or abnormal weight loss Eyes: Denies blurriness of vision Ears, nose, mouth, throat, and face: Denies mucositis or sore throat Respiratory: Denies cough, dyspnea or wheezes Cardiovascular: Denies palpitation, chest discomfort or lower extremity swelling Gastrointestinal:  Denies nausea, heartburn or change in bowel habits Skin: Denies abnormal skin rashes Lymphatics: Denies new lymphadenopathy or easy bruising Neurological:Denies numbness, tingling or new weaknesses Behavioral/Psych: Mood is stable, no new changes  All other systems were reviewed with the patient and are negative.   VITALS:  Blood pressure (!) 142/79, pulse 81, temperature (!) 97.5 F (36.4 C), temperature source Oral, resp. rate 18, weight 120 lb 9.6 oz (54.7 kg), SpO2 95%.  Wt Readings from Last 3 Encounters:  07/23/23 120 lb 9.6 oz (54.7 kg)  07/02/23 123 lb 4.8 oz (55.9 kg)  06/11/23 123 lb 6.4 oz (56 kg)    Body mass index is 20.07 kg/m.  Performance status (ECOG): 1 - Symptomatic but completely ambulatory  PHYSICAL EXAM:    GENERAL:alert, no distress and comfortable LUNGS: clear to auscultation and percussion with normal breathing effort BREAST: No abnormal lumps palpated, no discharge.  Left breast with lumpectomy scar, right breast with previous breast biopsy scar.  No palpable axillary lymphadenopathy HEART: regular rate & rhythm and no murmurs and no lower extremity edema ABDOMEN:abdomen soft, non-tender and normal bowel sounds Musculoskeletal:no cyanosis of digits and no clubbing  NEURO: alert & oriented x 3 with fluent speech  LABORATORY DATA:  I have reviewed the data as listed  Lab Results  Component Value Date   WBC 6.1 07/23/2023   NEUTROABS 4.2 07/23/2023   HGB 15.0 07/23/2023   HCT 45.8 07/23/2023   MCV 96.6 07/23/2023   PLT 326 07/23/2023  Chemistry      Component Value Date/Time   NA 136 07/23/2023 1008   NA 136 10/14/2019 1155   K 4.5 07/23/2023 1008   CL 99 07/23/2023 1008   CO2 27 07/23/2023 1008   BUN 13 07/23/2023 1008   BUN 13 10/14/2019 1155   CREATININE 0.56 07/23/2023 1008   CREATININE 0.59 12/19/2022 1114   CREATININE 0.71 08/16/2019 1518      Component Value Date/Time   CALCIUM  9.2 07/23/2023 1008   CALCIUM  9.7 10/07/2011 0919   ALKPHOS 50 07/23/2023 1008   AST 21 07/23/2023 1008   AST 18 12/19/2022 1114   ALT 21 07/23/2023 1008   ALT 12 12/19/2022 1114   BILITOT 0.3 07/23/2023 1008   BILITOT 0.4 12/19/2022 1114

## 2023-07-28 ENCOUNTER — Other Ambulatory Visit: Payer: Self-pay

## 2023-07-29 ENCOUNTER — Ambulatory Visit: Admitting: Internal Medicine

## 2023-08-05 ENCOUNTER — Ambulatory Visit (INDEPENDENT_AMBULATORY_CARE_PROVIDER_SITE_OTHER): Admitting: Internal Medicine

## 2023-08-05 ENCOUNTER — Ambulatory Visit: Payer: Self-pay | Admitting: Internal Medicine

## 2023-08-05 ENCOUNTER — Encounter: Payer: Self-pay | Admitting: Internal Medicine

## 2023-08-05 VITALS — BP 122/64 | HR 86 | Temp 97.7°F | Ht 65.0 in | Wt 122.7 lb

## 2023-08-05 DIAGNOSIS — R739 Hyperglycemia, unspecified: Secondary | ICD-10-CM

## 2023-08-05 DIAGNOSIS — F329 Major depressive disorder, single episode, unspecified: Secondary | ICD-10-CM

## 2023-08-05 DIAGNOSIS — F17200 Nicotine dependence, unspecified, uncomplicated: Secondary | ICD-10-CM | POA: Diagnosis not present

## 2023-08-05 DIAGNOSIS — E559 Vitamin D deficiency, unspecified: Secondary | ICD-10-CM | POA: Diagnosis not present

## 2023-08-05 DIAGNOSIS — Z0000 Encounter for general adult medical examination without abnormal findings: Secondary | ICD-10-CM

## 2023-08-05 DIAGNOSIS — Z0001 Encounter for general adult medical examination with abnormal findings: Secondary | ICD-10-CM

## 2023-08-05 DIAGNOSIS — E785 Hyperlipidemia, unspecified: Secondary | ICD-10-CM | POA: Diagnosis not present

## 2023-08-05 LAB — POCT GLYCOSYLATED HEMOGLOBIN (HGB A1C): Hemoglobin A1C: 5.7 % — AB (ref 4.0–5.6)

## 2023-08-05 MED ORDER — TEMAZEPAM 30 MG PO CAPS: 30.0000 mg | ORAL_CAPSULE | Freq: Every evening | ORAL | 1 refills | Status: DC | PRN

## 2023-08-05 MED ORDER — CITALOPRAM HYDROBROMIDE 10 MG PO TABS
10.0000 mg | ORAL_TABLET | Freq: Every day | ORAL | 3 refills | Status: DC
Start: 2023-08-05 — End: 2023-11-30

## 2023-08-05 NOTE — Assessment & Plan Note (Signed)

## 2023-08-05 NOTE — Progress Notes (Signed)
 The test results show that your current treatment is OK, as the tests are stable.  Please continue the same plan.  There is no other need for change of treatment or further evaluation based on these results, at this time.  thanks

## 2023-08-05 NOTE — Assessment & Plan Note (Signed)
 D/w pt - for start celexa  10 qd

## 2023-08-05 NOTE — Progress Notes (Signed)
 Patient ID: Michelle Hubbard, female   DOB: 1945-09-18, 78 y.o.   MRN: [id number]         Chief Complaint:: wellness exam and Annual Exam (Annual Exam. Wanted to speak with PCP about celexa  before starting it. Noticed Glucose was 105 post visit for infusion)         HPI:  Michelle Hubbard is a 78 y.o. female here for wellness exam with daughter today, up to date                        Also  with hx of breast ca now undergoing every 3 wks infusions with marked fatigue, only 5 tx left.  Pt denies chest pain, increased sob or doe, wheezing, orthopnea, PND, increased LE swelling, palpitations, dizziness or syncope.   Pt denies polydipsia, polyuria, or new focal neuro s/s.    Pt denies fever, wt loss, night sweats, loss of appetite, or other constitutional symptoms  Pt remains with temazepam  for sleep, has tried mult non benzo in past without effect.  Also has had moderate worsening depressive symptoms, but no suicidal ideation, or panic; has ongoing anxiety, not increased recently.   Was recently advised to start celexa  10 mg but wanted to see me today to confirm this is good choice, and possible side effects  Also mentions hx of mild hyperglycemia, no hx of preDM .     Wt Readings from Last 3 Encounters:  08/05/23 122 lb 10.9 oz (55.6 kg)  07/23/23 120 lb 9.6 oz (54.7 kg)  07/02/23 123 lb 4.8 oz (55.9 kg)   BP Readings from Last 3 Encounters:  08/05/23 122/64  07/23/23 127/65  07/23/23 (!) 142/79   Immunization History  Administered Date(s) Administered   DTaP 08/22/2022   Fluad Quad(high Dose 65+) 11/18/2021   Influenza Inj Mdck Quad With Preservative 10/24/2016   Influenza Split 10/07/2011, 12/05/2015   Influenza Whole 01/08/2010   Influenza, High Dose Seasonal PF 11/28/2014, 01/06/2018, 11/17/2018, 10/27/2019   Influenza,inj,Quad PF,6+ Mos 12/20/2013, 11/28/2015   Influenza-Unspecified 11/27/2017, 11/19/2022   PFIZER(Purple Top)SARS-COV-2 Vaccination 03/27/2019, 04/20/2019, 01/16/2020,  08/07/2020   Pneumococcal Conjugate-13 12/28/2014   Pneumococcal Polysaccharide-23 01/08/2010   Tdap 10/07/2011   Zoster Recombinant(Shingrix) 04/23/2017, 05/20/2017, 06/25/2017, 07/20/2017   Health Maintenance Due  Topic Date Due   Medicare Annual Wellness (AWV)  09/02/2023      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   Breast cancer (HCC) 08-2022   COPD 01/08/2010   DEPRESSIVE DISORDER 12/23/2006   Husband passed   FATIGUE 01/08/2010   HYPERLIPIDEMIA 01/08/2010   Hypertension    LOW BACK PAIN 12/23/2006   OAB (overactive bladder) 10/07/2011   OSTEOPOROSIS 01/08/2010   Other specified forms of hearing loss 01/08/2010   PONV (postoperative nausea and vomiting)    SINUSITIS- ACUTE-NOS 07/09/2007   Urticaria    Vitamin D  deficiency 04/30/2010   Past Surgical History:  Procedure Laterality Date   ABDOMINAL HYSTERECTOMY     APPENDECTOMY  1988   BREAST BIOPSY  1986   BREAST BIOPSY Left 08/18/2022   US  LT BREAST BX W LOC DEV 1ST LESION IMG BX SPEC US  GUIDE 08/18/2022 [facility]   BREAST BIOPSY  09/17/2022   MM LT RADIOACTIVE SEED LOC MAMMO GUIDE 09/17/2022 [facility]   BREAST LUMPECTOMY WITH RADIOACTIVE SEED LOCALIZATION Left 09/18/2022   Procedure: LEFT BREAST SEED LUMPECTOMY;  Surgeon: Vanderbilt Ned, MD;  Location:  [facility];  Service: General;  Laterality: Left;   COLON RESECTION N/A 06/03/2013   Procedure: LAPAROSCOPY DIAGNOSTIC and OPEN  SIGMOID COLON RESECTION ;  Surgeon: Lynda Leos, MD;  Location: [facility];  Service: General;  Laterality: N/A;   COLON SURGERY  2000   colon obstruction   larygenscopy     OOPHORECTOMY  bilat 1988   secondary to cyst    reports that she has been smoking cigarettes. She has a 44 pack-year smoking history. She has never used smokeless tobacco. She reports current alcohol use of about 7.0 standard drinks of alcohol per week. She reports that she does  not use drugs. family history includes Colon cancer in her brother and mother; Diabetes in her maternal aunt and maternal grandmother; Migraines in her father. Allergies  Allergen Reactions   Codeine     REACTION: nausea   Lortab [Hydrocodone -Acetaminophen ] Hives   Penicillins    Current Outpatient Medications on File Prior to Visit  Medication Sig Dispense Refill   Acetaminophen  (TYLENOL  PO) Take 500 mg by mouth daily as needed.     amLODipine  (NORVASC ) 5 MG tablet Take 1 tablet (5 mg total) by mouth daily. 90 tablet 3   aspirin  EC 81 MG tablet Take by mouth.     Cholecalciferol (VITAMIN D3) 50 MCG (2000 UT) TABS Take 50 mcg by mouth daily.     Coenzyme Q10 (COQ10) 100 MG CAPS Take 3 capsules by mouth daily. 30 capsule    diazepam (VALIUM) 2 MG tablet Take 2 mg by mouth 3 (three) times daily as needed.     FLUZONE HIGH-DOSE 0.5 ML injection      levocetirizine (XYZAL ) 5 MG tablet Take 5 mg by mouth every evening.     metoprolol  succinate (TOPROL -XL) 25 MG 24 hr tablet TAKE 1/2 (ONE-HALF) TABLET BY MOUTH ONCE DAILY WITH OR IMMEDIATELY FOLLOWING A MEAL 45 tablet 3   nitroGLYCERIN  (NITROSTAT ) 0.4 MG SL tablet DISSOLVE ONE TABLET UNDER THE TONGUE EVERY 5 MINUTES AS NEEDED FOR CHEST PAIN.  DO NOT EXCEED A TOTAL OF 3 DOSES IN 15 MINUTES 25 tablet 3   omeprazole  (PRILOSEC) 20 MG capsule Take 20 mg by mouth daily. Takes once every 3 days     polyethylene glycol (MIRALAX / GLYCOLAX) 17 g packet Take 17 g by mouth daily.     prochlorperazine  (COMPAZINE ) 10 MG tablet Take 1 tablet (10 mg total) by mouth every 8 (eight) hours as needed for nausea or vomiting. 90 tablet 0   rosuvastatin  (CRESTOR ) 20 MG tablet Take 1 tablet (20 mg total) by mouth daily. 90 tablet 3   TRASTUZUMAB  IV Inject into the vein every 21 ( twenty-one) days.     Triamcinolone Acetonide (NASACORT ALLERGY 24HR NA) Place 1 spray into the nose daily.     No current facility-administered medications on file prior to visit.         ROS:  All others reviewed and negative.  Objective        PE:  BP 122/64   Pulse 86   Temp 97.7 F (36.5 C)   Ht 5' 5 (1.651 m)   Wt 122 lb 10.9 oz (55.6 kg)   SpO2 94%   BMI 20.42 kg/m                 Constitutional: Pt appears in NAD               HENT: Head: NCAT.  Right Ear: External ear normal.                 Left Ear: External ear normal.                Eyes: . Pupils are equal, round, and reactive to light. Conjunctivae and EOM are normal               Nose: without d/c or deformity               Neck: Neck supple. Gross normal ROM               Cardiovascular: Normal rate and regular rhythm.                 Pulmonary/Chest: Effort normal and breath sounds without rales or wheezing.                Abd:  Soft, NT, ND, + BS, no organomegaly               Neurological: Pt is alert. At baseline orientation, motor grossly intact               Skin: Skin is warm. No rashes, no other new lesions, LE edema - none               Psychiatric: Pt behavior is normal without agitation   Micro: none  Cardiac tracings I have personally interpreted today:  none  Pertinent Radiological findings (summarize): none   Lab Results  Component Value Date   WBC 6.1 07/23/2023   HGB 15.0 07/23/2023   HCT 45.8 07/23/2023   PLT 326 07/23/2023   GLUCOSE 105 (H) 07/23/2023   CHOL 131 07/29/2022   TRIG 66.0 07/29/2022   HDL 67.40 07/29/2022   LDLDIRECT 146.3 10/07/2011   LDLCALC 51 07/29/2022   ALT 21 07/23/2023   AST 21 07/23/2023   NA 136 07/23/2023   K 4.5 07/23/2023   CL 99 07/23/2023   CREATININE 0.56 07/23/2023   BUN 13 07/23/2023   CO2 27 07/23/2023   TSH 1.79 07/29/2022   INR 0.96 05/27/2013   HGBA1C 5.7 (A) 08/05/2023   Assessment/Plan:  Michelle Hubbard is a 78 y.o. White or Caucasian [1] female with  has a past medical history of Allergy, Asthma-COPD overlap syndrome (HCC) (01/15/2016), Breast cancer (HCC) ([id number]), COPD (01/08/2010), DEPRESSIVE DISORDER  (12/23/2006), FATIGUE (01/08/2010), HYPERLIPIDEMIA (01/08/2010), Hypertension, LOW BACK PAIN (12/23/2006), OAB (overactive bladder) (10/07/2011), OSTEOPOROSIS (01/08/2010), Other specified forms of hearing loss (01/08/2010), PONV (postoperative nausea and vomiting), SINUSITIS- ACUTE-NOS (07/09/2007), Urticaria, and Vitamin D  deficiency (04/30/2010).  Encounter for well adult exam with abnormal findings Age and sex appropriate education and counseling updated with regular exercise and diet Referrals for preventative services - none needed Immunizations addressed - none needed Smoking counseling  - none needed Evidence for depression or other mood disorder - none significant Most recent labs reviewed. I have personally reviewed and have noted: 1) the patient's medical and social history 2) The patient's current medications and supplements 3) The patient's height, weight, and BMI have been recorded in the chart   Vitamin D  deficiency Last vitamin D  Lab Results  Component Value Date   VD25OH 28.68 (L) 07/29/2022   Low, to start oral replacement   Smoker Pt counsled to quit, pt not ready  Hyperlipidemia LDL goal <70 Lab Results  Component Value Date   LDLCALC 51 07/29/2022   Stable, pt to continue current statin crestor  20  mg every day, declines f/u lab today as she had many other labs recently   Hyperglycemia Lab Results  Component Value Date   HGBA1C 5.7 (A) 08/05/2023   Stable, pt to continue diet, wt control   Depression D/w pt - for start celexa  10 qd  Followup: Return in about 6 months (around 02/05/2024).  Lynwood Rush, MD 08/05/2023 8:22 PM [facility]

## 2023-08-05 NOTE — Assessment & Plan Note (Signed)
Last vitamin D Lab Results  Component Value Date   VD25OH 28.68 (L) 07/29/2022   Low, to start oral replacement

## 2023-08-05 NOTE — Assessment & Plan Note (Signed)
 Lab Results  Component Value Date   LDLCALC 51 07/29/2022   Stable, pt to continue current statin crestor  20 mg every day, declines f/u lab today as she had many other labs recently

## 2023-08-05 NOTE — Patient Instructions (Addendum)
 Your A1c was done today  Please take all new medication as prescribed - the citalopram  10 mg per day  Please continue all other medications as before, and refills have been done if requested - the temazepam   Please have the pharmacy call with any other refills you may need.  Please continue your efforts at being more active, low cholesterol diet, and weight control.  You are otherwise up to date with prevention measures today.  Please keep your appointments with your specialists as you may have planned  We can hold on further lab testing  Please make an Appointment to return in 6 months, or sooner if needed

## 2023-08-05 NOTE — Assessment & Plan Note (Signed)
 Lab Results  Component Value Date   HGBA1C 5.7 (A) 08/05/2023   Stable, pt to continue diet, wt control

## 2023-08-05 NOTE — Assessment & Plan Note (Signed)
 Pt counsled to quit, pt not ready

## 2023-08-06 ENCOUNTER — Other Ambulatory Visit: Payer: Self-pay | Admitting: Hematology and Oncology

## 2023-08-07 ENCOUNTER — Ambulatory Visit
Admission: RE | Admit: 2023-08-07 | Discharge: 2023-08-07 | Disposition: A | Discharge status: Home / Self Care | Source: Ambulatory Visit | Attending: Oncology | Admitting: Oncology

## 2023-08-07 DIAGNOSIS — C50912 Malignant neoplasm of unspecified site of left female breast: Secondary | ICD-10-CM

## 2023-08-08 ENCOUNTER — Other Ambulatory Visit: Payer: Self-pay | Admitting: Hematology and Oncology

## 2023-08-09 ENCOUNTER — Other Ambulatory Visit: Payer: Self-pay | Admitting: Hematology and Oncology

## 2023-08-09 DIAGNOSIS — Z170 Estrogen receptor positive status [ER+]: Secondary | ICD-10-CM

## 2023-08-11 ENCOUNTER — Encounter: Payer: Self-pay | Admitting: Cardiovascular Disease

## 2023-08-12 NOTE — Telephone Encounter (Signed)
 Called patient and scheduled appt. 11/30/23 at 11:40

## 2023-08-13 ENCOUNTER — Encounter: Payer: Self-pay | Admitting: Hematology and Oncology

## 2023-08-13 ENCOUNTER — Other Ambulatory Visit

## 2023-08-13 ENCOUNTER — Inpatient Hospital Stay: Attending: Licensed Clinical Social Worker

## 2023-08-13 ENCOUNTER — Other Ambulatory Visit: Payer: Self-pay

## 2023-08-13 VITALS — BP 125/60 | HR 66 | Temp 98.0°F | Resp 18 | Wt 123.0 lb

## 2023-08-13 DIAGNOSIS — C50412 Malignant neoplasm of upper-outer quadrant of left female breast: Secondary | ICD-10-CM | POA: Diagnosis not present

## 2023-08-13 DIAGNOSIS — Z170 Estrogen receptor positive status [ER+]: Secondary | ICD-10-CM | POA: Diagnosis not present

## 2023-08-13 DIAGNOSIS — Z5112 Encounter for antineoplastic immunotherapy: Secondary | ICD-10-CM | POA: Diagnosis present

## 2023-08-13 DIAGNOSIS — Z1722 Progesterone receptor negative status: Secondary | ICD-10-CM | POA: Diagnosis not present

## 2023-08-13 DIAGNOSIS — Z1731 Human epidermal growth factor receptor 2 positive status: Secondary | ICD-10-CM | POA: Diagnosis not present

## 2023-08-13 MED ORDER — TRASTUZUMAB-ANNS CHEMO 150 MG IV SOLR
6.0000 mg/kg | Freq: Once | INTRAVENOUS | Status: AC
  Administered 2023-08-13: 336 mg via INTRAVENOUS
  Filled 2023-08-13: qty 16

## 2023-08-13 MED ORDER — SODIUM CHLORIDE 0.9 % IV SOLN
Freq: Once | INTRAVENOUS | Status: AC
Start: 2023-08-13 — End: 2023-08-13

## 2023-08-13 NOTE — Patient Instructions (Signed)
 [facility] [place] - [facility]  Discharge Instructions: Thank you for choosing [facility] to provide your oncology and hematology care.  If you have a lab appointment with the Cancer Center - please note that after April 8th, 2024, all labs will be drawn in the cancer center.  You do not have to check in or register with the main entrance as you have in the past but will complete your check-in in the cancer center.  Wear comfortable clothing and clothing appropriate for easy access to any Portacath or PICC line.   We strive to give you quality time with your provider. You may need to reschedule your appointment if you arrive late (15 or more minutes).  Arriving late affects you and other patients whose appointments are after yours.  Also, if you miss three or more appointments without notifying the office, you may be dismissed from the clinic at the provider's discretion.      For prescription refill requests, have your pharmacy contact our office and allow 72 hours for refills to be completed.    Today you received the following chemotherapy and/or immunotherapy agents Kanjinti, return as scheduled.   To help prevent nausea and vomiting after your treatment, we encourage you to take your nausea medication as directed.  BELOW ARE SYMPTOMS THAT SHOULD BE REPORTED IMMEDIATELY: *FEVER GREATER THAN 100.4 F (38 C) OR HIGHER *CHILLS OR SWEATING *NAUSEA AND VOMITING THAT IS NOT CONTROLLED WITH YOUR NAUSEA MEDICATION *UNUSUAL SHORTNESS OF BREATH *UNUSUAL BRUISING OR BLEEDING *URINARY PROBLEMS (pain or burning when urinating, or frequent urination) *BOWEL PROBLEMS (unusual diarrhea, constipation, pain near the anus) TENDERNESS IN MOUTH AND THROAT WITH OR WITHOUT PRESENCE OF ULCERS (sore throat, sores in mouth, or a toothache) UNUSUAL RASH, SWELLING OR PAIN  UNUSUAL VAGINAL DISCHARGE OR ITCHING   Items with * indicate a potential emergency and  should be followed up as soon as possible or go to the Emergency Department if any problems should occur.  Please show the CHEMOTHERAPY ALERT CARD or IMMUNOTHERAPY ALERT CARD at check-in to the Emergency Department and triage nurse.  Should you have questions after your visit or need to cancel or reschedule your appointment, please contact [facility]CER CTR [place] - A DEPT OF Eligha Bridegroom [facility] [phone]  and follow the prompts.  Office hours are 8:00 a.m. to 4:30 p.m. Monday - Friday. Please note that voicemails left after 4:00 p.m. may not be returned until the following business day.  We are closed weekends and major holidays. You have access to a nurse at all times for urgent questions. Please call the main number to the clinic [phone] and follow the prompts.  For any non-urgent questions, you may also contact your provider using MyChart. We now offer e-Visits for anyone 91 and older to request care online for non-urgent symptoms. For details visit [email or web address].   Also download the MyChart app! Go to the app store, search "MyChart", open the app, select [place], and log in with your MyChart username and password.

## 2023-08-13 NOTE — Progress Notes (Signed)
 Patient took pre-meds at home. Patient tolerated therapy with no complaints voiced. Side effects with management reviewed with understanding verbalized. IV site clean and dry with no bruising or swelling noted at site. Good blood return noted before and after administration of therapy. Band aid applied. Patient left in satisfactory condition with VSS and no s/s of distress noted.

## 2023-08-14 ENCOUNTER — Inpatient Hospital Stay: Attending: Licensed Clinical Social Worker

## 2023-09-02 ENCOUNTER — Other Ambulatory Visit: Payer: Self-pay

## 2023-09-02 ENCOUNTER — Other Ambulatory Visit: Payer: Self-pay | Admitting: Pharmacist

## 2023-09-02 DIAGNOSIS — C50412 Malignant neoplasm of upper-outer quadrant of left female breast: Secondary | ICD-10-CM

## 2023-09-03 ENCOUNTER — Other Ambulatory Visit

## 2023-09-03 ENCOUNTER — Inpatient Hospital Stay

## 2023-09-03 ENCOUNTER — Inpatient Hospital Stay: Admitting: Oncology

## 2023-09-03 ENCOUNTER — Encounter: Payer: Self-pay | Admitting: Oncology

## 2023-09-03 ENCOUNTER — Inpatient Hospital Stay: Attending: Licensed Clinical Social Worker

## 2023-09-03 VITALS — BP 120/67 | HR 79 | Temp 98.0°F | Resp 18 | Wt 123.2 lb

## 2023-09-03 DIAGNOSIS — Z170 Estrogen receptor positive status [ER+]: Secondary | ICD-10-CM

## 2023-09-03 DIAGNOSIS — Z5112 Encounter for antineoplastic immunotherapy: Secondary | ICD-10-CM | POA: Diagnosis present

## 2023-09-03 DIAGNOSIS — C50412 Malignant neoplasm of upper-outer quadrant of left female breast: Secondary | ICD-10-CM | POA: Insufficient documentation

## 2023-09-03 DIAGNOSIS — Z79810 Long term (current) use of selective estrogen receptor modulators (SERMs): Secondary | ICD-10-CM | POA: Diagnosis not present

## 2023-09-03 DIAGNOSIS — Z1722 Progesterone receptor negative status: Secondary | ICD-10-CM | POA: Insufficient documentation

## 2023-09-03 DIAGNOSIS — M810 Age-related osteoporosis without current pathological fracture: Secondary | ICD-10-CM | POA: Insufficient documentation

## 2023-09-03 DIAGNOSIS — Z1731 Human epidermal growth factor receptor 2 positive status: Secondary | ICD-10-CM | POA: Diagnosis not present

## 2023-09-03 LAB — CBC WITH DIFFERENTIAL/PLATELET
Abs Immature Granulocytes: 0.03 K/uL (ref 0.00–0.07)
Basophils Absolute: 0 K/uL (ref 0.0–0.1)
Basophils Relative: 0 %
Eosinophils Absolute: 0.1 K/uL (ref 0.0–0.5)
Eosinophils Relative: 1 %
HCT: 41.7 % (ref 36.0–46.0)
Hemoglobin: 13.8 g/dL (ref 12.0–15.0)
Immature Granulocytes: 0 %
Lymphocytes Relative: 12 %
Lymphs Abs: 1 K/uL (ref 0.7–4.0)
MCH: 31.4 pg (ref 26.0–34.0)
MCHC: 33.1 g/dL (ref 30.0–36.0)
MCV: 95 fL (ref 80.0–100.0)
Monocytes Absolute: 0.8 K/uL (ref 0.1–1.0)
Monocytes Relative: 8 %
Neutro Abs: 7.1 K/uL (ref 1.7–7.7)
Neutrophils Relative %: 79 %
Platelets: 286 K/uL (ref 150–400)
RBC: 4.39 MIL/uL (ref 3.87–5.11)
RDW: 12.4 % (ref 11.5–15.5)
WBC: 9.1 K/uL (ref 4.0–10.5)
nRBC: 0 % (ref 0.0–0.2)

## 2023-09-03 LAB — COMPREHENSIVE METABOLIC PANEL WITH GFR
ALT: 18 U/L (ref 0–44)
AST: 24 U/L (ref 15–41)
Albumin: 3.9 g/dL (ref 3.5–5.0)
Alkaline Phosphatase: 41 U/L (ref 38–126)
Anion gap: 14 (ref 5–15)
BUN: 23 mg/dL (ref 8–23)
CO2: 24 mmol/L (ref 22–32)
Calcium: 8.6 mg/dL — ABNORMAL LOW (ref 8.9–10.3)
Chloride: 93 mmol/L — ABNORMAL LOW (ref 98–111)
Creatinine, Ser: 0.63 mg/dL (ref 0.44–1.00)
GFR, Estimated: >60 mL/min (ref >60)
Glucose, Bld: 121 mg/dL — ABNORMAL HIGH (ref 70–99)
Potassium: 3.7 mmol/L (ref 3.5–5.1)
Sodium: 131 mmol/L — ABNORMAL LOW (ref 135–145)
Total Bilirubin: 0.7 mg/dL (ref 0.0–1.2)
Total Protein: 7.4 g/dL (ref 6.5–8.1)

## 2023-09-03 MED ORDER — TRASTUZUMAB-ANNS CHEMO 150 MG IV SOLR
6.0000 mg/kg | Freq: Once | INTRAVENOUS | Status: AC
  Administered 2023-09-03: 336 mg via INTRAVENOUS
  Filled 2023-09-03: qty 16

## 2023-09-03 MED ORDER — SODIUM CHLORIDE 0.9 % IV SOLN
Freq: Once | INTRAVENOUS | Status: AC
Start: 2023-09-03 — End: 2023-09-03

## 2023-09-03 NOTE — Patient Instructions (Signed)
 [facility] - A DEPT OF [facility]  Discharge Instructions: Thank you for choosing [facility] to provide your oncology and hematology care.  If you have a lab appointment with the Cancer Center - please note that after April 8th, 2024, all labs will be drawn in the cancer center.  You do not have to check in or register with the main entrance as you have in the past but will complete your check-in in the cancer center.  Wear comfortable clothing and clothing appropriate for easy access to any Portacath or PICC line.   We strive to give you quality time with your provider. You may need to reschedule your appointment if you arrive late (15 or more minutes).  Arriving late affects you and other patients whose appointments are after yours.  Also, if you miss three or more appointments without notifying the office, you may be dismissed from the clinic at the provider's discretion.      For prescription refill requests, have your pharmacy contact our office and allow 72 hours for refills to be completed.    Today you received the following chemotherapy and/or immunotherapy agents [place], return as scheduled.    To help prevent nausea and vomiting after your treatment, we encourage you to take your nausea medication as directed.  BELOW ARE SYMPTOMS THAT SHOULD BE REPORTED IMMEDIATELY: *FEVER GREATER THAN 100.4 F (38 C) OR HIGHER *CHILLS OR SWEATING *NAUSEA AND VOMITING THAT IS NOT CONTROLLED WITH YOUR NAUSEA MEDICATION *UNUSUAL SHORTNESS OF BREATH *UNUSUAL BRUISING OR BLEEDING *URINARY PROBLEMS (pain or burning when urinating, or frequent urination) *BOWEL PROBLEMS (unusual diarrhea, constipation, pain near the anus) TENDERNESS IN MOUTH AND THROAT WITH OR WITHOUT PRESENCE OF ULCERS (sore throat, sores in mouth, or a toothache) UNUSUAL RASH, SWELLING OR PAIN  UNUSUAL VAGINAL DISCHARGE OR ITCHING   Items with * indicate a potential emergency and  should be followed up as soon as possible or go to the Emergency Department if any problems should occur.  Please show the CHEMOTHERAPY ALERT CARD or IMMUNOTHERAPY ALERT CARD at check-in to the Emergency Department and triage nurse.  Should you have questions after your visit or need to cancel or reschedule your appointment, please contact [facility]  - A DEPT OF [facility] [phone]  and follow the prompts.  Office hours are 8:00 a.m. to 4:30 p.m. Monday - Friday. Please note that voicemails left after 4:00 p.m. may not be returned until the following business day.  We are closed weekends and major holidays. You have access to a nurse at all times for urgent questions. Please call the main number to the clinic [phone] and follow the prompts.  For any non-urgent questions, you may also contact your provider using MyChart. We now offer e-Visits for anyone 1 and older to request care online for non-urgent symptoms. For details visit [email or web address].   Also download the MyChart app! Go to the app store, search MyChart, open the app, select [facility], and log in with your MyChart username and password.

## 2023-09-03 NOTE — Progress Notes (Signed)
 Patient presents today for treatment, patient reports taking pre-meds at home. Patient tolerated chemotherapy with no complaints voiced. Side effects with management reviewed understanding verbalized. IV site clean and dry with no bruising or swelling noted at site. Good blood return noted before and after administration of chemotherapy. Band aid applied. Patient left in satisfactory condition with VSS and no s/s of distress noted.

## 2023-09-04 ENCOUNTER — Ambulatory Visit: Admitting: Oncology

## 2023-09-04 ENCOUNTER — Inpatient Hospital Stay

## 2023-09-04 ENCOUNTER — Other Ambulatory Visit

## 2023-09-11 ENCOUNTER — Encounter

## 2023-09-21 ENCOUNTER — Ambulatory Visit (INDEPENDENT_AMBULATORY_CARE_PROVIDER_SITE_OTHER)

## 2023-09-21 VITALS — Ht 65.0 in | Wt 123.0 lb

## 2023-09-21 DIAGNOSIS — Z0000 Encounter for general adult medical examination without abnormal findings: Secondary | ICD-10-CM | POA: Diagnosis not present

## 2023-09-21 NOTE — Progress Notes (Signed)
 Subjective:   Michelle Hubbard is a 78 y.o. who presents for a Medicare Wellness preventive visit.  As a reminder, Annual Wellness Visits don't include a physical exam, and some assessments may be limited, especially if this visit is performed virtually. We may recommend an in-person follow-up visit with your provider if needed.  Visit Complete: Virtual I connected with  [name] on 09/21/23 by a audio enabled telemedicine application and verified that I am speaking with the correct person using two identifiers.  Patient Location: Home  Provider Location: Home Office  I discussed the limitations of evaluation and management by telemedicine. The patient expressed understanding and agreed to proceed.  Vital Signs: Because this visit was a virtual/telehealth visit, some criteria may be missing or patient reported. Any vitals not documented were not able to be obtained and vitals that have been documented are patient reported.  VideoDeclined- This patient declined Librarian, academic. Therefore the visit was completed with audio only.  Persons Participating in Visit: Patient.  AWV Questionnaire: Yes: Patient Medicare AWV questionnaire was completed by the patient on 08/03/2023; I have confirmed that all information answered by patient is correct and no changes since this date.  Cardiac Risk Factors include: advanced age (>33men, >54 women);Other (see comment);dyslipidemia;smoking/ tobacco exposure, Risk factor comments: COPD,  No change for alcohol screening-08/03/2023-per pt     Objective:    Today's Vitals   09/21/23 1032 09/21/23 1051  Weight: 123 lb (55.8 kg)   Height: 5' 5 (1.651 m)   PainSc:  0-No pain   Body mass index is 20.47 kg/m.     09/21/2023   10:56 AM 09/03/2023    2:53 PM 08/13/2023    1:54 PM 07/23/2023    9:39 AM 06/11/2023    1:17 PM 05/22/2023   10:55 AM 05/14/2023    1:25 PM  Advanced Directives  Does Patient Have a Medical  Advance Directive? Yes Yes Yes Yes Yes Yes Yes  Type of Estate agent of [place];Living will Healthcare Power of [place];Living will Healthcare Power of [place];Living will Healthcare Power of [place];Living will Healthcare Power of [place];Living will Healthcare Power of [place];Living will Healthcare Power of [place];Living will  Does patient want to make changes to medical advance directive?  No - Patient declined No - Patient declined   No - Patient declined No - Patient declined  Copy of Healthcare Power of Attorney in Chart? No - copy requested No - copy requested No - copy requested No - copy requested No - copy requested No - copy requested No - copy requested    Current Medications (verified) Outpatient Encounter Medications as of 09/21/2023  Medication Sig   Acetaminophen  (TYLENOL  PO) Take 500 mg by mouth daily as needed.   amLODipine  (NORVASC ) 5 MG tablet Take 1 tablet (5 mg total) by mouth daily.   aspirin  EC 81 MG tablet Take by mouth.   Cholecalciferol (VITAMIN D3) 50 MCG (2000 UT) TABS Take 50 mcg by mouth daily.   citalopram  (CELEXA ) 10 MG tablet Take 1 tablet (10 mg total) by mouth daily.   Coenzyme Q10 (COQ10) 100 MG CAPS Take 3 capsules by mouth daily.   diazepam (VALIUM) 2 MG tablet Take 2 mg by mouth 3 (three) times daily as needed.   FLUZONE HIGH-DOSE 0.5 ML injection    lactulose (CHRONULAC) 10 GM/15ML solution 15 mL as needed Orally 1-2 times daily; Duration: 30 days   levocetirizine (XYZAL ) 5 MG tablet Take 5  mg by mouth every evening.   lubiprostone (AMITIZA) 8 MCG capsule 1 capsule with food and water Orally Twice a day; Duration: 30 day(s)   metoprolol  succinate (TOPROL -XL) 25 MG 24 hr tablet TAKE 1/2 (ONE-HALF) TABLET BY MOUTH ONCE DAILY WITH OR IMMEDIATELY FOLLOWING A MEAL   naloxegol oxalate (MOVANTIK) 12.5 MG TABS tablet 1 tablet in the morning Orally Once a day; Duration: 30 day(s)   nitroGLYCERIN  (NITROSTAT ) 0.4 MG SL tablet DISSOLVE  ONE TABLET UNDER THE TONGUE EVERY 5 MINUTES AS NEEDED FOR CHEST PAIN.  DO NOT EXCEED A TOTAL OF 3 DOSES IN 15 MINUTES   omeprazole  (PRILOSEC) 20 MG capsule Take 20 mg by mouth daily. Takes once every 3 days   polyethylene glycol (MIRALAX / GLYCOLAX) 17 g packet Take 17 g by mouth daily.   prochlorperazine  (COMPAZINE ) 10 MG tablet Take 1 tablet (10 mg total) by mouth every 8 (eight) hours as needed for nausea or vomiting.   rosuvastatin  (CRESTOR ) 20 MG tablet Take 1 tablet (20 mg total) by mouth daily.   temazepam  (RESTORIL ) 30 MG capsule Take 1 capsule (30 mg total) by mouth at bedtime as needed for sleep.   TRASTUZUMAB  IV Inject into the vein every 21 ( twenty-one) days.   Triamcinolone Acetonide (NASACORT ALLERGY 24HR NA) Place 1 spray into the nose daily.   amoxicillin (AMOXIL) 500 MG capsule Take 500 mg by mouth 3 (three) times daily.   No facility-administered encounter medications on file as of 09/21/2023.    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   Breast cancer (HCC) 08-2022   COPD 01/08/2010   DEPRESSIVE DISORDER 12/23/2006   Husband passed   FATIGUE 01/08/2010   HYPERLIPIDEMIA 01/08/2010   Hypertension    LOW BACK PAIN 12/23/2006   OAB (overactive bladder) 10/07/2011   OSTEOPOROSIS 01/08/2010   Other specified forms of hearing loss 01/08/2010   PONV (postoperative nausea and vomiting)    SINUSITIS- ACUTE-NOS 07/09/2007   Urticaria    Vitamin D  deficiency 04/30/2010   Past Surgical History:  Procedure Laterality Date   ABDOMINAL HYSTERECTOMY     APPENDECTOMY  1988   BREAST BIOPSY  1986   BREAST BIOPSY Left 08/18/2022   US  LT BREAST BX W LOC DEV 1ST LESION IMG BX SPEC US  GUIDE 08/18/2022 [facility]   BREAST BIOPSY  09/17/2022   MM LT RADIOACTIVE SEED LOC MAMMO GUIDE 09/17/2022 [facility]   BREAST  LUMPECTOMY WITH RADIOACTIVE SEED LOCALIZATION Left 09/18/2022   Procedure: LEFT BREAST SEED LUMPECTOMY;  Surgeon: Vanderbilt Ned, MD;  Location: [facility];  Service: General;  Laterality: Left;   COLON RESECTION N/A 06/03/2013   Procedure: LAPAROSCOPY DIAGNOSTIC and OPEN  SIGMOID COLON RESECTION ;  Surgeon: Lynda Leos, MD;  Location: [facility];  Service: General;  Laterality: N/A;   COLON SURGERY  2000   colon obstruction   larygenscopy     OOPHORECTOMY  bilat 1988   secondary to cyst   Family History  Problem Relation Age of Onset   Colon cancer Mother    Migraines Father    Colon cancer Brother    Diabetes Maternal Aunt    Diabetes Maternal Grandmother    Allergic rhinitis Neg Hx    Angioedema Neg Hx    Asthma Neg Hx    Eczema Neg Hx    Immunodeficiency Neg Hx  Urticaria Neg Hx    Social History   Socioeconomic History   Marital status: Widowed    Spouse name: Not on file   Number of children: 3   Years of education: Not on file   Highest education level: 12th grade  Occupational History   Occupation: Retired    Associate Professor: Designer, television/film set [employer]  Tobacco Use   Smoking status: Former    Current packs/day: 0.00    Average packs/day: 1 pack/day for 44.0 years (44.0 ttl pk-yrs)    Types: Cigarettes    Quit date: 09/15/2023    Years since quitting: 0.0   Smokeless tobacco: Never   Tobacco comments:    smoking more due to move/ trying to quit  Vaping Use   Vaping status: Never Used  Substance and Sexual Activity   Alcohol use: Yes    Alcohol/week: 7.0 standard drinks of alcohol    Types: 7 Shots of liquor per week    Comment: vodka nightly   Drug use: No   Sexual activity: Not Currently  Other Topics Concern   Not on file  Social History Narrative   Are you right handed or left handed? Right   Are you currently employed ? N    What is your current occupation?   Do you live at home alone? Y/2025   Who lives with you?    What type of home do you  live in: 1 story or 2 story? 1       Social Drivers of Corporate investment banker Strain: Low Risk  (09/21/2023)   Overall Financial Resource Strain (CARDIA)    Difficulty of Paying Living Expenses: Not hard at all  Food Insecurity: No Food Insecurity (09/21/2023)   Hunger Vital Sign    Worried About Running Out of Food in the Last Year: Never true    Ran Out of Food in the Last Year: Never true  Transportation Needs: No Transportation Needs (09/21/2023)   PRAPARE - Administrator, Civil Service (Medical): No    Lack of Transportation (Non-Medical): No  Physical Activity: Inactive (09/21/2023)   Exercise Vital Sign    Days of Exercise per Week: 0 days    Minutes of Exercise per Session: Not on file  Stress: Stress Concern Present (09/21/2023)   [employer] of Occupational Health - Occupational Stress Questionnaire    Feeling of Stress: To some extent  Social Connections: Moderately Isolated (09/21/2023)   Social Connection and Isolation Panel    Frequency of Communication with Friends and Family: More than three times a week    Frequency of Social Gatherings with Friends and Family: Three times a week    Attends Religious Services: More than 4 times per year    Active Member of Clubs or Organizations: No    Attends Banker Meetings: Not on file    Marital Status: Widowed    Tobacco Counseling Counseling given: Not Answered Tobacco comments: smoking more due to move/ trying to quit    Clinical Intake:  Pre-visit preparation completed: Yes  Pain : No/denies pain Pain Score: 0-No pain     BMI - recorded: 20.47 Nutritional Status: BMI of 19-24  Normal Nutritional Risks: None Diabetes: No  Lab Results  Component Value Date   HGBA1C 5.7 (A) 08/05/2023   HGBA1C 5.9 07/29/2022   HGBA1C 6.0 04/08/2021     How often do you need to have someone help you when you read instructions, pamphlets, or other  written materials from your doctor or  pharmacy?: 1 - Never  Interpreter Needed?: No  Information entered by :: Jemmie Ledgerwood, RMA   Activities of Daily Living     09/21/2023   10:52 AM  In your present state of health, do you have any difficulty performing the following activities:  Hearing? 1  Comment wears hearing aides  Vision? 0  Difficulty concentrating or making decisions? 0  Walking or climbing stairs? 0  Dressing or bathing? 0  Doing errands, shopping? 0  Preparing Food and eating ? N  Using the Toilet? N  In the past six months, have you accidently leaked urine? Y  Do you have problems with loss of bowel control? N  Managing your Medications? N  Managing your Finances? N  Housekeeping or managing your Housekeeping? N    Patient Care Team: Norleen Lynwood ORN, MD as PCP - General Croitoru, Jerel, MD as PCP - Cardiology (Cardiology) Vanderbilt Ned, MD as Consulting Physician (General Surgery) Izell Domino, MD as Attending Physician (Radiation Oncology) Leanor Deward BROCKS, MD as Referring Physician (Ophthalmology) Patel, Donika K, DO as Consulting Physician (Neurology) Davonna Siad, MD as Medical Oncologist (Medical Oncology) Celestia Joesph SQUIBB, RN as Oncology Nurse Navigator (Medical Oncology)  I have updated your Care Teams any recent Medical Services you may have received from other providers in the past year.     Assessment:   This is a routine wellness examination for Michelle Hubbard.  Hearing/Vision screen Hearing Screening - Comments:: wears hearing aides Vision Screening - Comments:: Denies vision issues. /Dr. Nettie    Goals Addressed             This Visit's Progress    Maintain current health status   On track    Continue to be active exercise, eat healthy, enjoy life and family       Depression Screen     09/21/2023   10:59 AM 08/05/2023    1:17 PM 07/23/2023    9:36 AM 10/17/2022   12:37 PM 09/02/2022   10:44 AM 08/28/2022    9:25 AM 07/29/2022   11:02 AM  PHQ 2/9 Scores  PHQ - 2 Score 0 2  0 0 0 0 0  PHQ- 9 Score 1    6      Fall Risk     09/21/2023   10:57 AM 08/05/2023    1:17 PM 10/20/2022   12:54 PM 09/02/2022    9:07 AM 07/29/2022   11:01 AM  Fall Risk   Falls in the past year? 0 0 0 0 0  Number falls in past yr: 0 0 0 0 0  Injury with Fall? 0 0 0 0 0  Risk for fall due to :     No Fall Risks  Follow up Falls evaluation completed;Falls prevention discussed  Falls evaluation completed  Falls evaluation completed    MEDICARE RISK AT HOME:  Medicare Risk at Home Any stairs in or around the home?: Yes (basment stairs) If so, are there any without handrails?: No Home free of loose throw rugs in walkways, pet beds, electrical cords, etc?: Yes Adequate lighting in your home to reduce risk of falls?: Yes Life alert?: No Use of a cane, walker or w/c?: No Grab bars in the bathroom?: Yes Shower chair or bench in shower?: Yes Elevated toilet seat or a handicapped toilet?: Yes  TIMED UP AND GO:  Was the test performed?  No  Cognitive Function: Declined/Normal: No cognitive concerns noted by  patient or family. Patient alert, oriented, able to answer questions appropriately and recall recent events. No signs of memory loss or confusion.    08/12/2018    1:20 PM 11/28/2014   12:11 PM  MMSE - Mini Mental State Exam  Not completed:  --  Orientation to time 5   Orientation to Place 5   Registration 3   Attention/ Calculation 5   Recall 3   Language- name 2 objects 2   Language- repeat 1   Language- follow 3 step command 3   Language- read & follow direction 1   Write a sentence 1   Copy design 1   Total score 30         09/02/2022   10:41 AM 08/16/2019    1:55 PM  6CIT Screen  What Year? 0 points 0 points  What month? 0 points 0 points  What time? 0 points 0 points  Count back from 20 0 points 0 points  Months in reverse 0 points 0 points  Repeat phrase 0 points 0 points  Total Score 0 points 0 points    Immunizations Immunization History  Administered  Date(s) Administered   DTaP 08/22/2022   Fluad Quad(high Dose 65+) 11/18/2021   INFLUENZA, HIGH DOSE SEASONAL PF 11/28/2014, 01/06/2018, 11/17/2018, 10/27/2019   Influenza Inj Mdck Quad With Preservative 10/24/2016   Influenza Split 10/07/2011, 12/05/2015   Influenza Whole 01/08/2010   Influenza,inj,Quad PF,6+ Mos 12/20/2013, 11/28/2015   Influenza-Unspecified 11/27/2017, 11/19/2022   PFIZER(Purple Top)SARS-COV-2 Vaccination 03/27/2019, 04/20/2019, 01/16/2020, 08/07/2020   Pneumococcal Conjugate-13 12/28/2014   Pneumococcal Polysaccharide-23 01/08/2010   Tdap 10/07/2011   Zoster Recombinant(Shingrix) 04/23/2017, 05/20/2017, 06/25/2017, 07/20/2017    Screening Tests Health Maintenance  Topic Date Due   Medicare Annual Wellness (AWV)  09/02/2023   INFLUENZA VACCINE  10/28/2023 (Originally 08/28/2023)   MAMMOGRAM  08/06/2025   Colonoscopy  02/06/2027   DTaP/Tdap/Td (3 - Td or Tdap) 08/21/2032   Pneumococcal Vaccine: 50+ Years  Completed   DEXA SCAN  Completed   Hepatitis C Screening  Completed   Zoster Vaccines- Shingrix  Completed   HPV VACCINES  Aged Out   Meningococcal B Vaccine  Aged Out   Lung Cancer Screening  Discontinued   COVID-19 Vaccine  Discontinued    Health Maintenance  Health Maintenance Due  Topic Date Due   Medicare Annual Wellness (AWV)  09/02/2023   Health Maintenance Items Addressed: See Nurse Notes at the end of this note  Additional Screening:  Vision Screening: Recommended annual ophthalmology exams for early detection of glaucoma and other disorders of the eye. Would you like a referral to an eye doctor? No    Dental Screening: Recommended annual dental exams for proper oral hygiene  Community Resource Referral / Chronic Care Management: CRR required this visit?  No   CCM required this visit?  No   Plan:    I have personally reviewed and noted the following in the patient's chart:   Medical and social history Use of alcohol, tobacco or  illicit drugs  Current medications and supplements including opioid prescriptions. Patient is not currently taking opioid prescriptions. Functional ability and status Nutritional status Physical activity Advanced directives List of other physicians Hospitalizations, surgeries, and ER visits in previous 12 months Vitals Screenings to include cognitive, depression, and falls Referrals and appointments  In addition, I have reviewed and discussed with patient certain preventive protocols, quality metrics, and best practice recommendations. A written personalized care plan for preventive services as well  as general preventive health recommendations were provided to patient.   Ayn Domangue L Caryn Gienger, CMA   09/21/2023   After Visit Summary: (MyChart) Due to this being a telephonic visit, the after visit summary with patients personalized plan was offered to patient via MyChart   Notes: Patient is eligible for a lung cancer screening.  Please discuss during next office visit with patient.  She had no other concerns to address today

## 2023-09-21 NOTE — Patient Instructions (Signed)
 Ms. Primm , Thank you for taking time out of your busy schedule to complete your Annual Wellness Visit with me. I enjoyed our conversation and look forward to speaking with you again next year. I, as well as your care team,  appreciate your ongoing commitment to your health goals. Please review the following plan we discussed and let me know if I can assist you in the future. Your Game plan/ To Do List     Follow up Visits: We will see or speak with you next year for your Next Medicare AWV with our clinical staff Have you seen your provider in the last 6 months (3 months if uncontrolled diabetes)? Yes.  Last office on 08/05/2023.  Clinician Recommendations:  Aim for 30 minutes of exercise or brisk walking, 6-8 glasses of water, and 5 servings of fruits and vegetables each day.       This is a list of the screenings recommended for you:  Health Maintenance  Topic Date Due   Flu Shot  10/28/2023*   Medicare Annual Wellness Visit  09/20/2024   Mammogram  08/06/2025   Colon Cancer Screening  02/06/2027   DTaP/Tdap/Td vaccine (3 - Td or Tdap) 08/21/2032   Pneumococcal Vaccine for age over 54  Completed   DEXA scan (bone density measurement)  Completed   Hepatitis C Screening  Completed   Zoster (Shingles) Vaccine  Completed   HPV Vaccine  Aged Out   Meningitis B Vaccine  Aged Out   Screening for Lung Cancer  Discontinued   COVID-19 Vaccine  Discontinued  *Topic was postponed. The date shown is not the original due date.    Advanced directives: (Copy Requested) Please bring a copy of your health care power of attorney and living will to the office to be added to your chart at your convenience. You can mail to [facility] [address] [place], [place] [address] or email to [email or web address] Advance Care Planning is important because it:  [x]  Makes sure you receive the medical care that is consistent with your values, goals, and preferences  [x]  It provides  guidance to your family and loved ones and reduces their decisional burden about whether or not they are making the right decisions based on your wishes.  Follow the link provided in your after visit summary or read over the paperwork we have mailed to you to help you started getting your Advance Directives in place. If you need assistance in completing these, please reach out to us  so that we can help you!  See attachments for Preventive Care and Fall Prevention Tips.

## 2023-09-22 ENCOUNTER — Other Ambulatory Visit: Payer: Self-pay

## 2023-09-23 ENCOUNTER — Other Ambulatory Visit: Payer: Self-pay

## 2023-09-23 DIAGNOSIS — Z170 Estrogen receptor positive status [ER+]: Secondary | ICD-10-CM

## 2023-09-23 NOTE — Assessment & Plan Note (Addendum)
 Patient is a history of osteoporosis and has received denosumab before. -Considered treatment with tamoxifen instead of AI for this reason.

## 2023-09-23 NOTE — Assessment & Plan Note (Addendum)
 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   Patient was not given chemotherapy adjuvantly for concerns of potential toxicity considering her age. Patient is currently on trastuzumab  6 mg/kg every 3 weeks maintenance dose along with tamoxifen  10 mg daily [poor tolerability for 20 mg with hot flashes].   Patient intermittently stopped taking tamoxifen  on 05/06/2023 attributing it to severe hot flashes.  She is reluctant to try SSRIs for hot flashes.  Discussed risk versus benefits of stopping tamoxifen  including the risk of recurrence.  Patient restarted taking tamoxifen  on 07/06/2023, but is only taking 5 mg daily  -Continue tamoxifen  5 mg daily and as tolerated.  To continue for a total of 5 years -Recent echo from 04/07/2022 showed good EF.  Will require 1 more at the end of treatment. - Plan for mammogram at 1 year mark that is July 2025.  Negative. Repeat yearly - Physical exam stable today.  Okay to get treatment.  Patient has 1 more dose after this. - Labs reviewed today.  WNL except mild hyponatremia  Return to clinic in 3 months for physical exam and follow-up

## 2023-09-23 NOTE — Assessment & Plan Note (Addendum)
 Patient has nausea without vomiting as a complication of treatment. Zofran  is causing constipation.  Will prescribe Compazine   -Continue antinausea medication as needed. -Continue MiraLAX as needed for constipation [side effect of antinausea medication]

## 2023-09-23 NOTE — Progress Notes (Unsigned)
 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  Currently stopped taking tamoxifen  for hot flashes.  Assessment & Plan Malignant neoplasm of upper-outer quadrant of left breast in female, estrogen receptor positive (HCC) 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   Patient was not given chemotherapy adjuvantly for concerns of potential toxicity considering her age. Patient is currently on trastuzumab  6 mg/kg every 3 weeks maintenance dose along with tamoxifen  10 mg daily [poor tolerability for 20 mg with hot flashes].   Patient intermittently stopped taking tamoxifen  on 05/06/2023 attributing it to severe hot flashes.  She is reluctant to try SSRIs for hot flashes.  Discussed risk versus benefits of stopping tamoxifen  including the risk of recurrence.  Patient restarted taking tamoxifen  on 07/06/2023, but is only taking 5 mg daily  -Continue tamoxifen  5 mg daily and as tolerated.  To continue for a total of 5 years -Recent echo from 04/07/2022 showed good EF.  Will require 1 more at the end of treatment. - Plan for mammogram at 1 year  mark that is July 2025.  Negative. Repeat yearly - Physical exam stable today.  Okay to get treatment.  Patient has 1 more dose after this. - Labs reviewed today.  WNL except mild hyponatremia  Return to clinic in 3 months for physical exam and follow-up Osteoporosis, unspecified osteoporosis type, unspecified pathological fracture presence Patient is a history of osteoporosis and has received denosumab  before.  -Considered treatment with tamoxifen  instead of AI for this reason. Nausea without vomiting Patient has nausea without vomiting as a complication of treatment. Zofran  is causing constipation.  Will prescribe Compazine   -Continue antinausea medication as needed. -Continue MiraLAX as needed for constipation [side effect of antinausea medication] Hyponatremia Patient had chronic hyponatremia for at least a year now. You were evaluated by nephrology  - Will refer to nephrology for evaluation of hyponatremia   The patient understands the plans discussed today and is in agreement with them.  She knows to contact our office if she develops concerns prior to her next appointment.  I provided 20 minutes of face-to-face time during this encounter and > 50% was spent counseling as documented under my assessment and plan.    Siad Davonna, MD  [facility] [place] - A DEPT OF [facility] [address] [place] [address] Dept: [phone] Dept Fax: [phone]    ONCOLOGY HISTORY:   Oncology History  Malignant neoplasm of upper-outer quadrant of left breast in female, estrogen receptor positive (HCC)  08/18/2022 Initial Diagnosis   Screening mammogram detected left breast mass upper central 4 o'clock position: 1.3 cm, axilla negative, biopsy:  Grade 2 IDC ER 95% PR 0% Ki67 15%, HER2 3+ positive   08/27/2022 Cancer Staging   Staging form: Breast, AJCC 8th Edition - Clinical stage from 08/27/2022: Stage IA (cT1c, cN0,  cM0, G2, ER+, PR-, HER2+) - Signed by Odean Potts, MD on 08/27/2022 Stage prefix: Initial diagnosis Histologic grading system: 3 grade system Laterality: Left Staged by: Pathologist and managing physician Stage used in treatment planning: Yes National guidelines used in treatment planning: Yes Type of national guideline used in treatment planning: NCCN   09/18/2022 Surgery   09/18/2022: Left lumpectomy: Grade 2 IDC 1.2 cm with DCIS intermediate grade, margins negative, LVI not present, ER 100%, PR 0%, HER2 positive by FISH ratio 3, copy #4.95, Ki-67 10%    09/18/2022 Pathology Results   FINAL MICROSCOPIC DIAGNOSIS:   A. BREAST, LEFT, LUMPECTOMY:       Invasive ductal carcinoma with micropapillary features, 1.2 cm, grade 2  Ductal carcinoma in situ:  Cribriform type, intermediate grade, with calcifications  Margins, invasive:  Negative  Lymphovascular invasion:  Not identified   Prognostic markers:  ER: 95%, positive, strong staining intensity  PR: 0%, negative  Her2: Positive by FISH  Ki-67: 15%    10/17/2022 Imaging   Echo: Left ventricular ejection fraction, by estimation, is 65 to 70%    11/07/2022 -  Chemotherapy   -Tamoxifen  10mg  daily for 5 years. Dose reduced from 20mg  daily for hot flashes. Patient is on Treatment Plan : BREAST MAINTENANCE Trastuzumab  IV (6) or SQ (600) D1 q21d x 13 cycles      11/10/2022 - 11/14/2022 Radiation Therapy   Plan Name: Breast_L Site: Breast, Left Technique: 3D Mode: Photon Dose Per Fraction: 5.2 Gy Prescribed Dose (Delivered / Prescribed): 26 Gy / 26 Gy Prescribed Fxs (Delivered / Prescribed): 5 / 5   01/05/2023 Imaging   Echo: Left ventricular ejection fraction, by estimation, is 60 to 65%.    04/07/2023 Imaging   ECHO:  EF: 55-60%. Left ventricular diastolic parameters are consistent with Grade I diastolic  dysfunction        Current Treatment:  Trastuzumab  maintenance plus tamoxifen    INTERVAL HISTORY:  Michelle Hubbard is  here today for follow up. Patient is accompanied by her friend today.   She is taking tamoxifen  at a reduced dose of 5 mg due to previous side effects. She experiences hot flashes and disrupted sleep, which she attributes to the medication. She has missed one dose due to a dental issue.She has no other complaints today. She is very excited that she is towards the end of her infusions.    I have reviewed the past medical history, past surgical history, social history and family history with the patient and they are unchanged from previous note.  ALLERGIES:  is allergic to codeine, lortab [hydrocodone -acetaminophen ], and penicillins.  MEDICATIONS:  Current Outpatient Medications  Medication Sig Dispense Refill   Acetaminophen  (TYLENOL  PO) Take 500 mg by mouth daily as needed.     amLODipine  (NORVASC ) 5 MG tablet Take 1 tablet (5 mg total) by mouth daily. 90 tablet 3   amoxicillin (AMOXIL) 500 MG capsule Take 500 mg by mouth 3 (three) times daily.     aspirin  EC 81 MG tablet Take by mouth.     Cholecalciferol (VITAMIN D3) 50 MCG (2000 UT) TABS Take 50 mcg by mouth daily.     citalopram  (CELEXA ) 10 MG tablet Take 1 tablet (10 mg total) by mouth daily. 90 tablet 3   Coenzyme Q10 (COQ10)  100 MG CAPS Take 3 capsules by mouth daily. 30 capsule    diazepam (VALIUM) 2 MG tablet Take 2 mg by mouth 3 (three) times daily as needed.     FLUZONE HIGH-DOSE 0.5 ML injection      lactulose (CHRONULAC) 10 GM/15ML solution 15 mL as needed Orally 1-2 times daily; Duration: 30 days     levocetirizine (XYZAL ) 5 MG tablet Take 5 mg by mouth every evening.     lubiprostone (AMITIZA) 8 MCG capsule 1 capsule with food and water Orally Twice a day; Duration: 30 day(s)     metoprolol  succinate (TOPROL -XL) 25 MG 24 hr tablet TAKE 1/2 (ONE-HALF) TABLET BY MOUTH ONCE DAILY WITH OR IMMEDIATELY FOLLOWING A MEAL 45 tablet 3   naloxegol oxalate (MOVANTIK) 12.5 MG TABS tablet 1 tablet in the morning Orally Once a day; Duration:  30 day(s)     nitroGLYCERIN  (NITROSTAT ) 0.4 MG SL tablet DISSOLVE ONE TABLET UNDER THE TONGUE EVERY 5 MINUTES AS NEEDED FOR CHEST PAIN.  DO NOT EXCEED A TOTAL OF 3 DOSES IN 15 MINUTES 25 tablet 3   omeprazole  (PRILOSEC) 20 MG capsule Take 20 mg by mouth daily. Takes once every 3 days     polyethylene glycol (MIRALAX / GLYCOLAX) 17 g packet Take 17 g by mouth daily.     prochlorperazine  (COMPAZINE ) 10 MG tablet Take 1 tablet (10 mg total) by mouth every 8 (eight) hours as needed for nausea or vomiting. 90 tablet 0   rosuvastatin  (CRESTOR ) 20 MG tablet Take 1 tablet (20 mg total) by mouth daily. 90 tablet 3   temazepam  (RESTORIL ) 30 MG capsule Take 1 capsule (30 mg total) by mouth at bedtime as needed for sleep. 90 capsule 1   TRASTUZUMAB  IV Inject into the vein every 21 ( twenty-one) days.     Triamcinolone Acetonide (NASACORT ALLERGY 24HR NA) Place 1 spray into the nose daily.     No current facility-administered medications for this visit.   REVIEW OF SYSTEMS:   Constitutional: Denies fevers, chills or abnormal weight loss Eyes: Denies blurriness of vision Ears, nose, mouth, throat, and face: Denies mucositis or sore throat Respiratory: Denies cough, dyspnea or wheezes Cardiovascular: Denies palpitation, chest discomfort or lower extremity swelling Gastrointestinal:  Denies nausea, heartburn or change in bowel habits Skin: Denies abnormal skin rashes Lymphatics: Denies new lymphadenopathy or easy bruising Neurological:Denies numbness, tingling or new weaknesses Behavioral/Psych: Mood is stable, no new changes  All other systems were reviewed with the patient and are negative.   VITALS:  There were no vitals taken for this visit.  Wt Readings from Last 3 Encounters:  09/21/23 123 lb (55.8 kg)  09/03/23 123 lb 3.8 oz (55.9 kg)  08/13/23 123 lb (55.8 kg)    There is no height or weight on file to calculate BMI.  Performance status (ECOG): 1 - Symptomatic but completely  ambulatory  PHYSICAL EXAM:   GENERAL:alert, no distress and comfortable LUNGS: clear to auscultation and percussion with normal breathing effort BREAST: No abnormal lumps palpated, no discharge.  Left breast with lumpectomy scar, right breast with previous breast biopsy scar.  No palpable axillary lymphadenopathy HEART: regular rate & rhythm and no murmurs and no lower extremity edema ABDOMEN:abdomen soft, non-tender and normal bowel sounds Musculoskeletal:no cyanosis of digits and no clubbing  NEURO: alert & oriented x 3 with fluent speech  LABORATORY DATA:  I have reviewed the data as listed  Lab Results  Component Value Date   WBC 9.1 09/03/2023  NEUTROABS 7.1 09/03/2023   HGB 13.8 09/03/2023   HCT 41.7 09/03/2023   MCV 95.0 09/03/2023   PLT 286 09/03/2023      Chemistry      Component Value Date/Time   NA 131 (L) 09/03/2023 1307   NA 136 10/14/2019 1155   K 3.7 09/03/2023 1307   CL 93 (L) 09/03/2023 1307   CO2 24 09/03/2023 1307   BUN 23 09/03/2023 1307   BUN 13 10/14/2019 1155   CREATININE 0.63 09/03/2023 1307   CREATININE 0.59 12/19/2022 1114   CREATININE 0.71 08/16/2019 1518      Component Value Date/Time   CALCIUM  8.6 (L) 09/03/2023 1307   CALCIUM  9.7 10/07/2011 0919   ALKPHOS 41 09/03/2023 1307   AST 24 09/03/2023 1307   AST 18 12/19/2022 1114   ALT 18 09/03/2023 1307   ALT 12 12/19/2022 1114   BILITOT 0.7 09/03/2023 1307   BILITOT 0.4 12/19/2022 1114

## 2023-09-24 ENCOUNTER — Other Ambulatory Visit

## 2023-09-24 ENCOUNTER — Ambulatory Visit

## 2023-09-24 ENCOUNTER — Inpatient Hospital Stay

## 2023-09-24 ENCOUNTER — Encounter: Payer: Self-pay | Admitting: Oncology

## 2023-09-24 ENCOUNTER — Inpatient Hospital Stay (HOSPITAL_BASED_OUTPATIENT_CLINIC_OR_DEPARTMENT_OTHER): Admitting: Oncology

## 2023-09-24 VITALS — BP 109/58 | HR 66 | Temp 97.7°F | Resp 16 | Wt 119.4 lb

## 2023-09-24 VITALS — BP 111/54 | HR 74 | Temp 97.5°F | Resp 17

## 2023-09-24 DIAGNOSIS — E871 Hypo-osmolality and hyponatremia: Secondary | ICD-10-CM | POA: Diagnosis not present

## 2023-09-24 DIAGNOSIS — Z170 Estrogen receptor positive status [ER+]: Secondary | ICD-10-CM | POA: Diagnosis not present

## 2023-09-24 DIAGNOSIS — R110 Nausea: Secondary | ICD-10-CM | POA: Diagnosis not present

## 2023-09-24 DIAGNOSIS — C50412 Malignant neoplasm of upper-outer quadrant of left female breast: Secondary | ICD-10-CM | POA: Diagnosis not present

## 2023-09-24 DIAGNOSIS — M810 Age-related osteoporosis without current pathological fracture: Secondary | ICD-10-CM

## 2023-09-24 DIAGNOSIS — Z5112 Encounter for antineoplastic immunotherapy: Secondary | ICD-10-CM | POA: Diagnosis not present

## 2023-09-24 LAB — COMPREHENSIVE METABOLIC PANEL WITH GFR
ALT: 18 U/L (ref 0–44)
AST: 25 U/L (ref 15–41)
Albumin: 4 g/dL (ref 3.5–5.0)
Alkaline Phosphatase: 41 U/L (ref 38–126)
Anion gap: 9 (ref 5–15)
BUN: 15 mg/dL (ref 8–23)
CO2: 25 mmol/L (ref 22–32)
Calcium: 8.7 mg/dL — ABNORMAL LOW (ref 8.9–10.3)
Chloride: 98 mmol/L (ref 98–111)
Creatinine, Ser: 0.52 mg/dL (ref 0.44–1.00)
GFR, Estimated: >60 mL/min (ref >60)
Glucose, Bld: 90 mg/dL (ref 70–99)
Potassium: 4.3 mmol/L (ref 3.5–5.1)
Sodium: 132 mmol/L — ABNORMAL LOW (ref 135–145)
Total Bilirubin: 0.7 mg/dL (ref 0.0–1.2)
Total Protein: 7.5 g/dL (ref 6.5–8.1)

## 2023-09-24 LAB — CBC WITH DIFFERENTIAL/PLATELET
Abs Immature Granulocytes: 0.02 K/uL (ref 0.00–0.07)
Basophils Absolute: 0.1 K/uL (ref 0.0–0.1)
Basophils Relative: 1 %
Eosinophils Absolute: 0.1 K/uL (ref 0.0–0.5)
Eosinophils Relative: 1 %
HCT: 42.6 % (ref 36.0–46.0)
Hemoglobin: 14 g/dL (ref 12.0–15.0)
Immature Granulocytes: 0 %
Lymphocytes Relative: 12 %
Lymphs Abs: 1.1 K/uL (ref 0.7–4.0)
MCH: 31.4 pg (ref 26.0–34.0)
MCHC: 32.9 g/dL (ref 30.0–36.0)
MCV: 95.5 fL (ref 80.0–100.0)
Monocytes Absolute: 1 K/uL (ref 0.1–1.0)
Monocytes Relative: 11 %
Neutro Abs: 6.9 K/uL (ref 1.7–7.7)
Neutrophils Relative %: 75 %
Platelets: 335 K/uL (ref 150–400)
RBC: 4.46 MIL/uL (ref 3.87–5.11)
RDW: 12.6 % (ref 11.5–15.5)
WBC: 9.2 K/uL (ref 4.0–10.5)
nRBC: 0 % (ref 0.0–0.2)

## 2023-09-24 MED ORDER — SODIUM CHLORIDE 0.9 % IV SOLN: Freq: Once | INTRAVENOUS | Status: AC

## 2023-09-24 MED ORDER — TRASTUZUMAB-ANNS CHEMO 150 MG IV SOLR
6.0000 mg/kg | Freq: Once | INTRAVENOUS | Status: AC
  Administered 2023-09-24: 336 mg via INTRAVENOUS
  Filled 2023-09-24: qty 16

## 2023-09-24 NOTE — Patient Instructions (Signed)
 [facility] [place] - A DEPT OF [place]  Discharge Instructions: Thank you for choosing [facility] to provide your oncology and hematology care.  If you have a lab appointment with the Cancer Center - please note that after April 8th, 2024, all labs will be drawn in the cancer center.  You do not have to check in or register with the main entrance as you have in the past but will complete your check-in in the cancer center.  Wear comfortable clothing and clothing appropriate for easy access to any Portacath or PICC line.   We strive to give you quality time with your provider. You may need to reschedule your appointment if you arrive late (15 or more minutes).  Arriving late affects you and other patients whose appointments are after yours.  Also, if you miss three or more appointments without notifying the office, you may be dismissed from the clinic at the provider's discretion.      For prescription refill requests, have your pharmacy contact our office and allow 72 hours for refills to be completed.    Today you received the following chemotherapy and/or immunotherapy agents Kanjinti       To help prevent nausea and vomiting after your treatment, we encourage you to take your nausea medication as directed.   Trastuzumab  Injection What is this medication? TRASTUZUMAB  (tras TOO zoo mab) treats breast cancer and stomach cancer. It works by blocking a protein that causes cancer cells to grow and multiply. This helps to slow or stop the spread of cancer cells. This medicine may be used for other purposes; ask your health care provider or pharmacist if you have questions. COMMON BRAND NAME(S): Herceptin , HERCESSI, Herzuma , KANJINTI , Ogivri , Ontruzant , Trazimera  What should I tell my care team before I take this medication? They need to know if you have any of these conditions: Heart failure Lung disease An unusual or allergic reaction to trastuzumab ,  other medications, foods, dyes, or preservatives Pregnant or trying to get pregnant Breast-feeding How should I use this medication? This medication is injected into a vein. It is given by your care team in a hospital or clinic setting. Talk to your care team about the use of this medication in children. It is not approved for use in children. Overdosage: If you think you have taken too much of this medicine contact a poison control center or emergency room at once. NOTE: This medicine is only for you. Do not share this medicine with others. What if I miss a dose? Keep appointments for follow-up doses. It is important not to miss your dose. Call your care team if you are unable to keep an appointment. What may interact with this medication? Certain types of chemotherapy, such as daunorubicin, doxorubicin, epirubicin, idarubicin This list may not describe all possible interactions. Give your health care provider a list of all the medicines, herbs, non-prescription drugs, or dietary supplements you use. Also tell them if you smoke, drink alcohol, or use illegal drugs. Some items may interact with your medicine. What should I watch for while using this medication? Your condition will be monitored carefully while you are receiving this medication. This medication may make you feel generally unwell. This is not uncommon, as chemotherapy affects healthy cells as well as cancer cells. Report any side effects. Continue your course of treatment even though you feel ill unless your care team tells you to stop. This medication may increase your risk of getting  an infection. Call your care team for advice if you get a fever, chills, sore throat, or other symptoms of a cold or flu. Do not treat yourself. Try to avoid being around people who are sick. Avoid taking medications that contain aspirin , acetaminophen , ibuprofen, naproxen, or ketoprofen unless instructed by your care team. These medications can hide a  fever. Talk to your care team if you may be pregnant. Serious birth defects can occur if you take this medication during pregnancy and for 7 months after the last dose. You will need a negative pregnancy test before starting this medication. Contraception is recommended while taking this medication and for 7 months after the last dose. Your care team can help you find the option that works for you. Do not breastfeed while taking this medication and for 7 months after stopping treatment. What side effects may I notice from receiving this medication? Side effects that you should report to your care team as soon as possible: Allergic reactions or angioedema--skin rash, itching or hives, swelling of the face, eyes, lips, tongue, arms, or legs, trouble swallowing or breathing Dry cough, shortness of breath or trouble breathing Heart failure--shortness of breath, swelling of the ankles, feet, or hands, sudden weight gain, unusual weakness or fatigue Infection--fever, chills, cough, or sore throat Infusion reactions--chest pain, shortness of breath or trouble breathing, feeling faint or lightheaded Side effects that usually do not require medical attention (report to your care team if they continue or are bothersome): Diarrhea Dizziness Headache Nausea Trouble sleeping Vomiting This list may not describe all possible side effects. Call your doctor for medical advice about side effects. You may report side effects to FDA at 1-800-FDA-1088. Where should I keep my medication? This medication is given in a hospital or clinic. It will not be stored at home. NOTE: This sheet is a summary. It may not cover all possible information. If you have questions about this medicine, talk to your doctor, pharmacist, or health care provider.  2024 Elsevier/Gold Standard (2021-05-28 00:00:00) BELOW ARE SYMPTOMS THAT SHOULD BE REPORTED IMMEDIATELY: *FEVER GREATER THAN 100.4 F (38 C) OR HIGHER *CHILLS OR  SWEATING *NAUSEA AND VOMITING THAT IS NOT CONTROLLED WITH YOUR NAUSEA MEDICATION *UNUSUAL SHORTNESS OF BREATH *UNUSUAL BRUISING OR BLEEDING *URINARY PROBLEMS (pain or burning when urinating, or frequent urination) *BOWEL PROBLEMS (unusual diarrhea, constipation, pain near the anus) TENDERNESS IN MOUTH AND THROAT WITH OR WITHOUT PRESENCE OF ULCERS (sore throat, sores in mouth, or a toothache) UNUSUAL RASH, SWELLING OR PAIN  UNUSUAL VAGINAL DISCHARGE OR ITCHING   Items with * indicate a potential emergency and should be followed up as soon as possible or go to the Emergency Department if any problems should occur.  Please show the CHEMOTHERAPY ALERT CARD or IMMUNOTHERAPY ALERT CARD at check-in to the Emergency Department and triage nurse.  Should you have questions after your visit or need to cancel or reschedule your appointment, please contact [facility] [place] - A DEPT OF [facility] [phone]  and follow the prompts.  Office hours are 8:00 a.m. to 4:30 p.m. Monday - Friday. Please note that voicemails left after 4:00 p.m. may not be returned until the following business day.  We are closed weekends and major holidays. You have access to a nurse at all times for urgent questions. Please call the main number to the clinic [phone] and follow the prompts.  For any non-urgent questions, you may also contact your provider using MyChart. We now offer  e-Visits for anyone 44 and older to request care online for non-urgent symptoms. For details visit [email or web address].   Also download the MyChart app! Go to the app store, search MyChart, open the app, select [facility], and log in with your MyChart username and password.

## 2023-09-24 NOTE — Patient Instructions (Signed)

## 2023-09-24 NOTE — Progress Notes (Signed)
 Patient presents today for Kanjinti  infusion. Patient is in satisfactory condition with no new complaints voiced.  Vital signs are stable.  Labs reviewed by Dr. Davonna during the office visit and all labs are within treatment parameters.  We will proceed with treatment per MD orders.   Patient took pre-meds at home prior to arrival. Peripheral IV started with good blood return pre and post infusion.  Treatment given today per MD orders. Tolerated infusion without adverse affects. Vital signs stable. No complaints at this time. Discharged from clinic ambulatory in stable condition. Alert and oriented x 3. F/U with [facility] as scheduled.

## 2023-09-25 ENCOUNTER — Encounter: Payer: Self-pay | Admitting: Oncology

## 2023-09-25 DIAGNOSIS — E871 Hypo-osmolality and hyponatremia: Secondary | ICD-10-CM | POA: Insufficient documentation

## 2023-09-25 NOTE — Assessment & Plan Note (Signed)
 Patient had chronic hyponatremia for at least a year now. You were evaluated by nephrology  - Will refer to nephrology for evaluation of hyponatremia

## 2023-10-13 ENCOUNTER — Encounter: Payer: Self-pay | Admitting: Oncology

## 2023-10-15 ENCOUNTER — Inpatient Hospital Stay: Attending: Licensed Clinical Social Worker

## 2023-10-15 ENCOUNTER — Other Ambulatory Visit

## 2023-10-15 VITALS — BP 104/51 | HR 66 | Temp 97.1°F | Resp 18 | Wt 123.2 lb

## 2023-10-15 DIAGNOSIS — Z79810 Long term (current) use of selective estrogen receptor modulators (SERMs): Secondary | ICD-10-CM | POA: Insufficient documentation

## 2023-10-15 DIAGNOSIS — Z1722 Progesterone receptor negative status: Secondary | ICD-10-CM | POA: Diagnosis not present

## 2023-10-15 DIAGNOSIS — C50412 Malignant neoplasm of upper-outer quadrant of left female breast: Secondary | ICD-10-CM | POA: Insufficient documentation

## 2023-10-15 DIAGNOSIS — Z5112 Encounter for antineoplastic immunotherapy: Secondary | ICD-10-CM | POA: Diagnosis present

## 2023-10-15 DIAGNOSIS — Z1731 Human epidermal growth factor receptor 2 positive status: Secondary | ICD-10-CM | POA: Diagnosis not present

## 2023-10-15 DIAGNOSIS — Z170 Estrogen receptor positive status [ER+]: Secondary | ICD-10-CM | POA: Diagnosis not present

## 2023-10-15 MED ORDER — SODIUM CHLORIDE 0.9 % IV SOLN: Freq: Once | INTRAVENOUS | Status: AC

## 2023-10-15 MED ORDER — TRASTUZUMAB-ANNS CHEMO 150 MG IV SOLR
6.0000 mg/kg | Freq: Once | INTRAVENOUS | Status: AC
  Administered 2023-10-15: 336 mg via INTRAVENOUS
  Filled 2023-10-15: qty 16

## 2023-10-15 NOTE — Patient Instructions (Signed)
 [facility] [place] - [facility]  Discharge Instructions: Thank you for choosing [facility] to provide your oncology and hematology care.  If you have a lab appointment with the Cancer Center - please note that after April 8th, 2024, all labs will be drawn in the cancer center.  You do not have to check in or register with the main entrance as you have in the past but will complete your check-in in the cancer center.  Wear comfortable clothing and clothing appropriate for easy access to any Portacath or PICC line.   We strive to give you quality time with your provider. You may need to reschedule your appointment if you arrive late (15 or more minutes).  Arriving late affects you and other patients whose appointments are after yours.  Also, if you miss three or more appointments without notifying the office, you may be dismissed from the clinic at the provider's discretion.      For prescription refill requests, have your pharmacy contact our office and allow 72 hours for refills to be completed.    Today you received the following chemotherapy and/or immunotherapy agents Kanjinti      To help prevent nausea and vomiting after your treatment, we encourage you to take your nausea medication as directed.  BELOW ARE SYMPTOMS THAT SHOULD BE REPORTED IMMEDIATELY: *FEVER GREATER THAN 100.4 F (38 C) OR HIGHER *CHILLS OR SWEATING *NAUSEA AND VOMITING THAT IS NOT CONTROLLED WITH YOUR NAUSEA MEDICATION *UNUSUAL SHORTNESS OF BREATH *UNUSUAL BRUISING OR BLEEDING *URINARY PROBLEMS (pain or burning when urinating, or frequent urination) *BOWEL PROBLEMS (unusual diarrhea, constipation, pain near the anus) TENDERNESS IN MOUTH AND THROAT WITH OR WITHOUT PRESENCE OF ULCERS (sore throat, sores in mouth, or a toothache) UNUSUAL RASH, SWELLING OR PAIN  UNUSUAL VAGINAL DISCHARGE OR ITCHING   Items with * indicate a potential emergency and should be followed up  as soon as possible or go to the Emergency Department if any problems should occur.  Please show the CHEMOTHERAPY ALERT CARD or IMMUNOTHERAPY ALERT CARD at check-in to the Emergency Department and triage nurse.  Should you have questions after your visit or need to cancel or reschedule your appointment, please contact [facility] CANCER CTR [place] - A DEPT OF Eligha Bridegroom [facility] [phone]  and follow the prompts.  Office hours are 8:00 a.m. to 4:30 p.m. Monday - Friday. Please note that voicemails left after 4:00 p.m. may not be returned until the following business day.  We are closed weekends and major holidays. You have access to a nurse at all times for urgent questions. Please call the main number to the clinic [phone] and follow the prompts.  For any non-urgent questions, you may also contact your provider using MyChart. We now offer e-Visits for anyone 41 and older to request care online for non-urgent symptoms. For details visit [email or web address].   Also download the MyChart app! Go to the app store, search "MyChart", open the app, select [place], and log in with your MyChart username and password.

## 2023-10-15 NOTE — Progress Notes (Signed)
 Patient presents today for Kanjinti  infusion.  Patient is in satisfactory condition with no new complaints voiced.  All other vital signs are stable.  Patient's BP noted to be 85/50. Dr.Kandala made aware and stated to give 1 Liter of NS over 1 hour. Labs reviewed and all labs are within treatment parameters.  We will proceed with treatment per MD orders.    Patient took pre-meds Tylenol  650 mg and Zyretc 10 mg at home prior to arrival.  Treatment given today per MD orders. Tolerated infusion without adverse affects. Vital signs stable. No complaints at this time. Discharged from clinic ambulatory in stable condition. Alert and oriented x 3. F/U with [facility] as scheduled.

## 2023-11-06 ENCOUNTER — Other Ambulatory Visit (HOSPITAL_COMMUNITY): Payer: Self-pay | Admitting: Nephrology

## 2023-11-06 DIAGNOSIS — I10 Essential (primary) hypertension: Secondary | ICD-10-CM

## 2023-11-06 DIAGNOSIS — E871 Hypo-osmolality and hyponatremia: Secondary | ICD-10-CM

## 2023-11-06 DIAGNOSIS — I5032 Chronic diastolic (congestive) heart failure: Secondary | ICD-10-CM

## 2023-11-16 IMAGING — DX DG ELBOW COMPLETE 3+V*L*
4 series · 4 of 4 positions shown · non-contrast
Comparison: None Available.

CLINICAL DATA: Fall on concrete floor.  Left elbow injury and pain.

EXAM:
LEFT ELBOW - COMPLETE 3+ VIEW

[elbow ap]
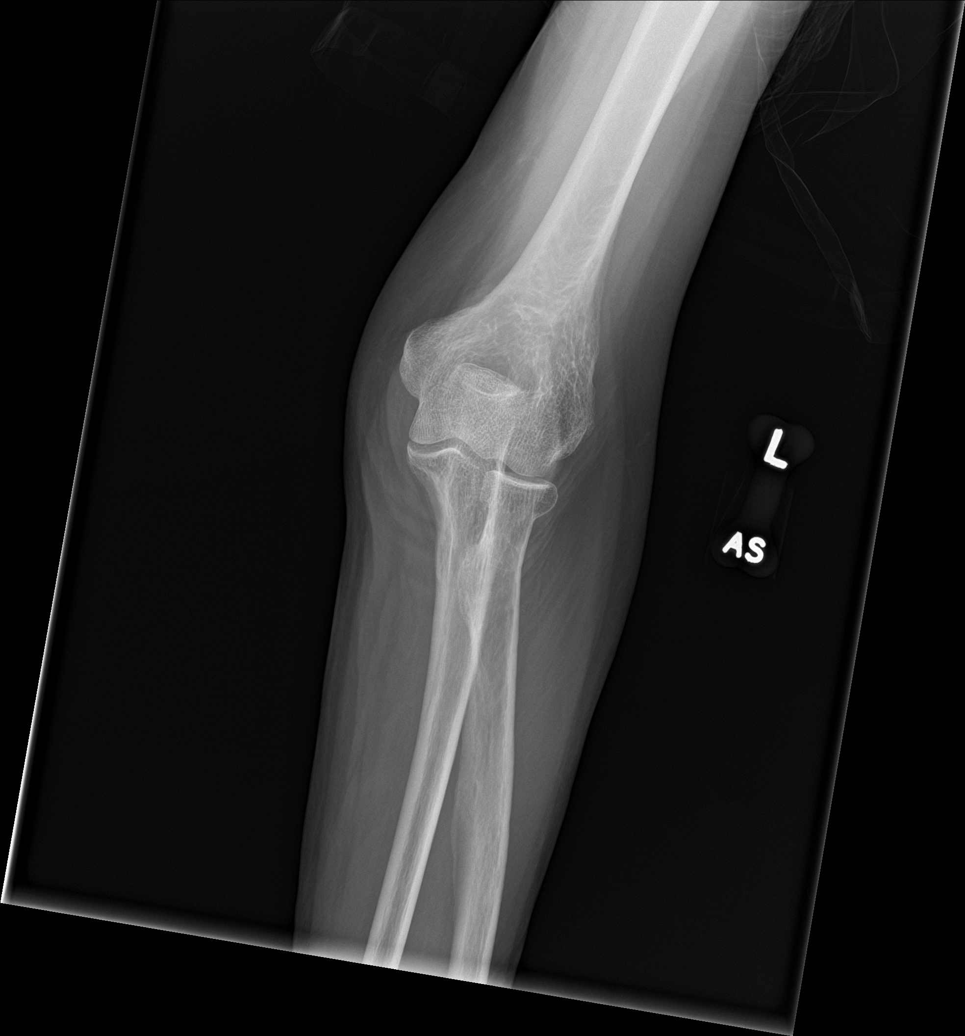

[elbow obl (1 of 2)]
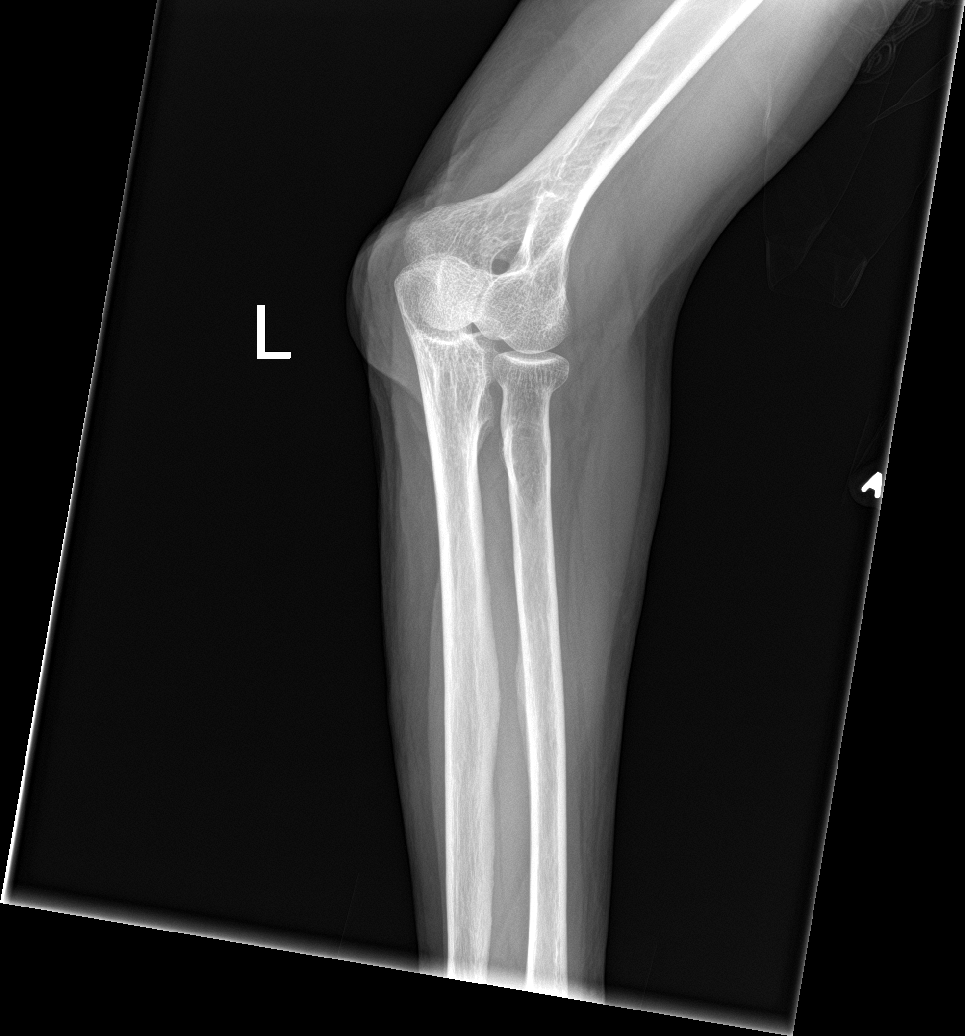

[elbow obl (2 of 2)]
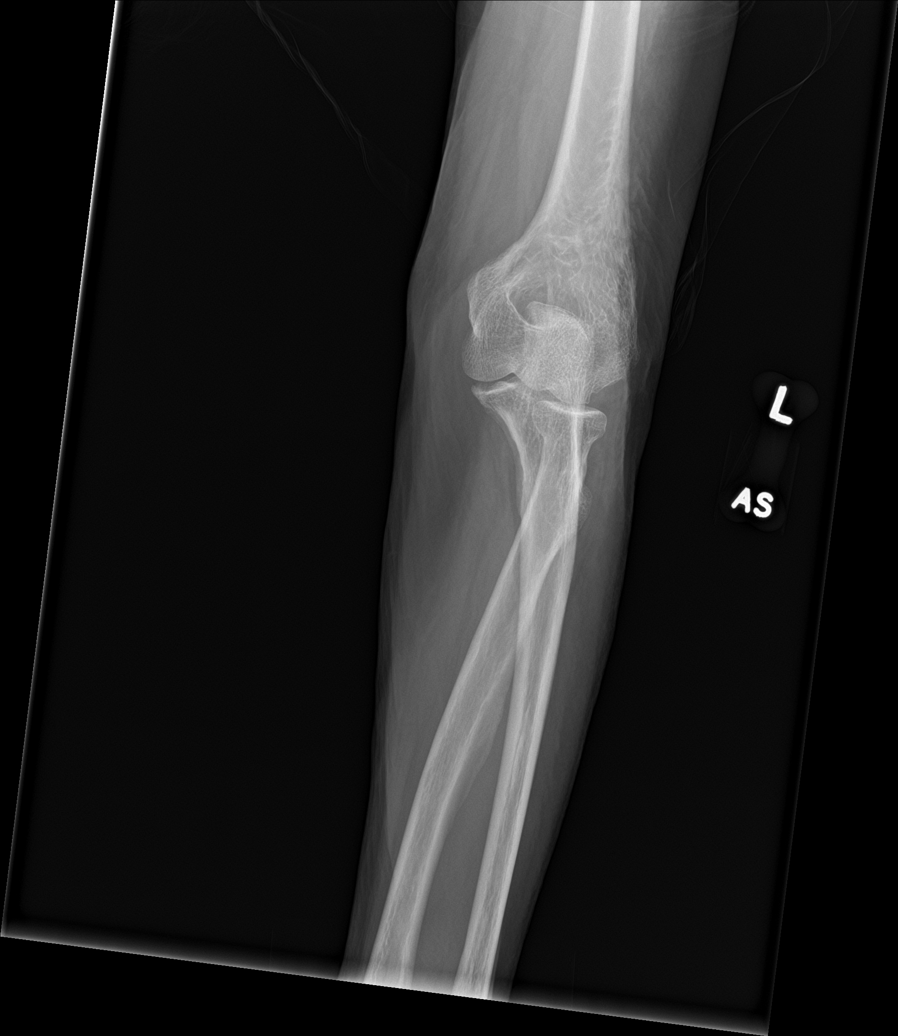

[elbow lat]
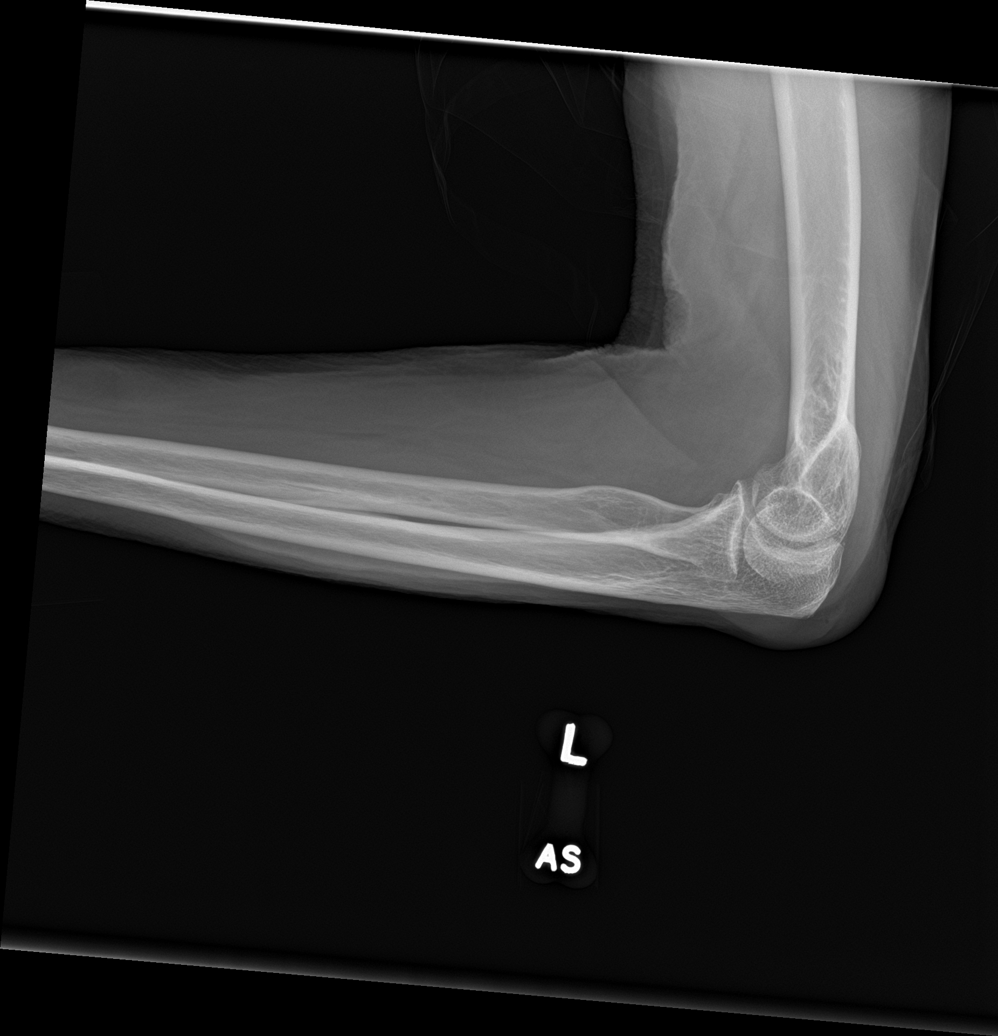

[4 of 4 positions shown; findings below may reference images not displayed]

FINDINGS: There is no evidence of fracture, dislocation, or joint effusion.
There is no evidence of arthropathy or other focal bone abnormality.
Soft tissue swelling is seen overlying the olecranon process.
IMPRESSION: Soft tissue swelling overlying the olecranon process. No evidence of
fracture or dislocation.

## 2023-11-16 IMAGING — CT CT ABD-PELV W/ CM
2 of 5 series · 17 of 46 positions shown, 19 images · IV contrast (APPLIED)
Comparison: 06/29/2018

CLINICAL DATA: Nausea/vomiting Bowel obstruction suspected

EXAM:
CT ABDOMEN AND PELVIS WITH CONTRAST
TECHNIQUE: Multidetector CT imaging of the abdomen and pelvis was performed
using the standard protocol following bolus administration of
intravenous contrast.

[Series 3: abd/ pelvis 5.0 i30f 2 · axial · 0.77mm/px · z∈[+867,+1247]mm · 14 of 86 slices shown, 16 images]
[im 5/86  soft-tissue]
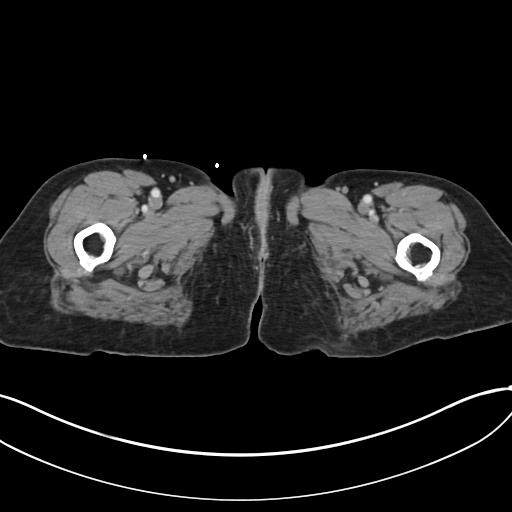
[im 5/86  bone]
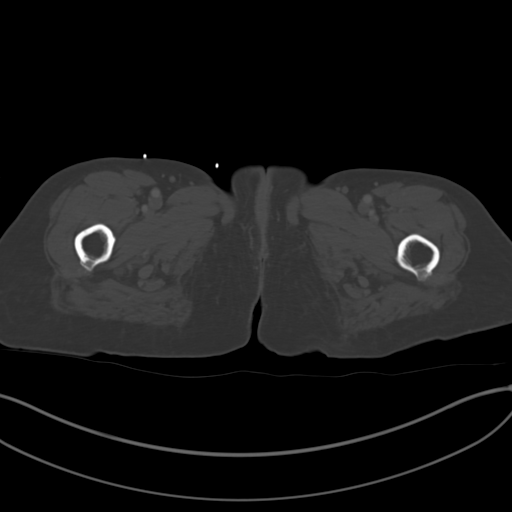
[im 10/86  soft-tissue]
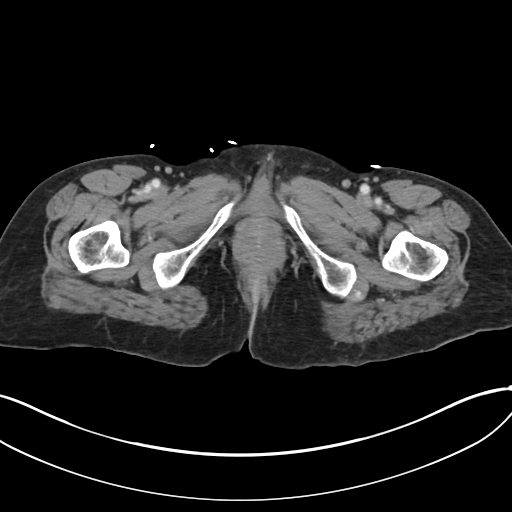
[im 19/86  soft-tissue]
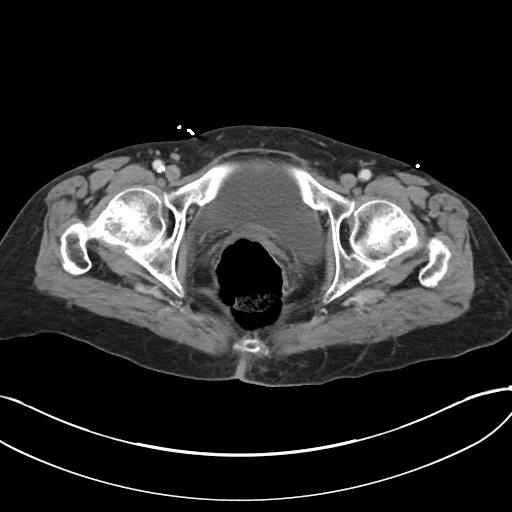
[im 24/86  soft-tissue]
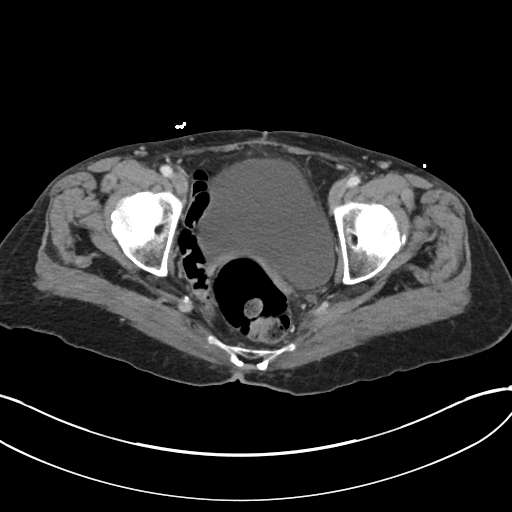
[im 29/86  soft-tissue]
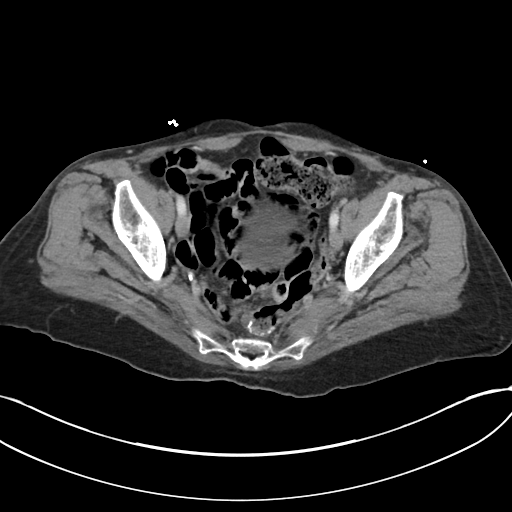
[im 34/86  soft-tissue]
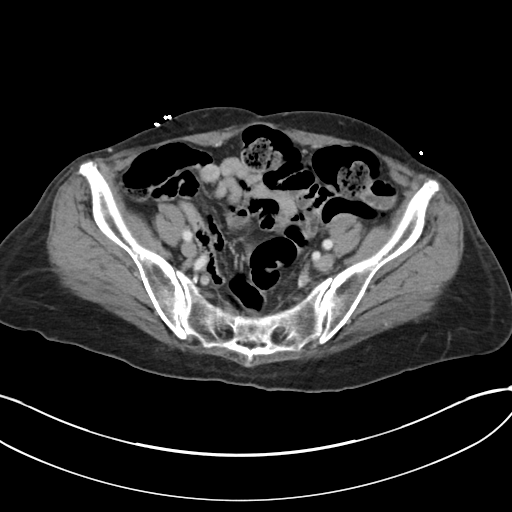
[im 38/86  soft-tissue]
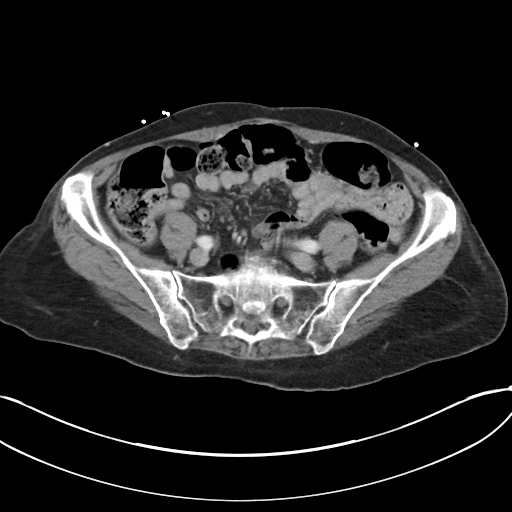
[im 48/86  soft-tissue]
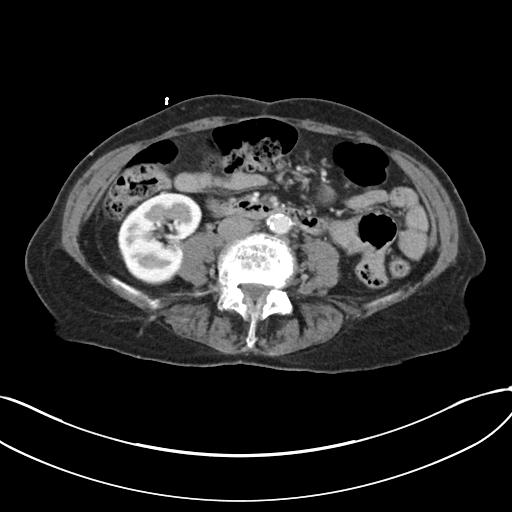
[im 52/86  soft-tissue]
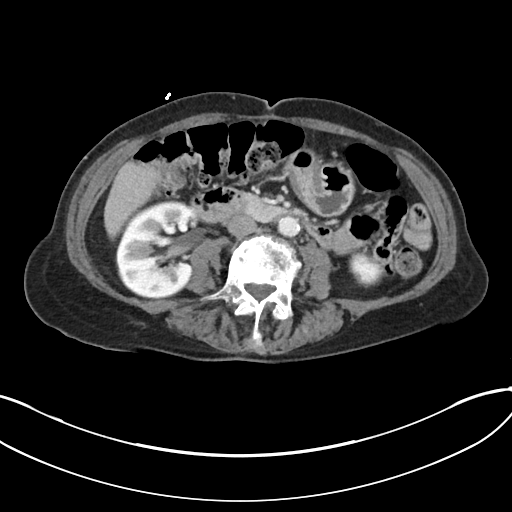
[im 52/86  bone]
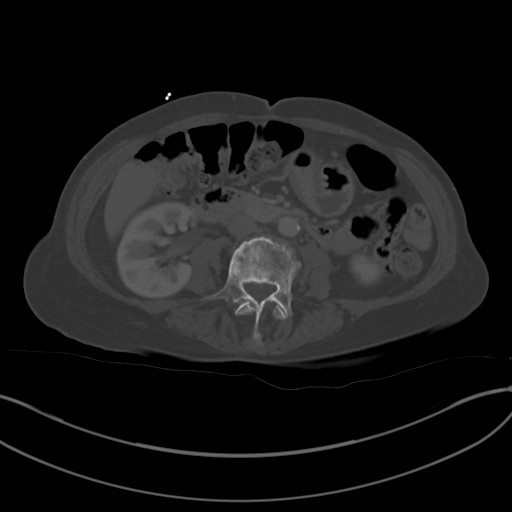
[im 57/86  soft-tissue]
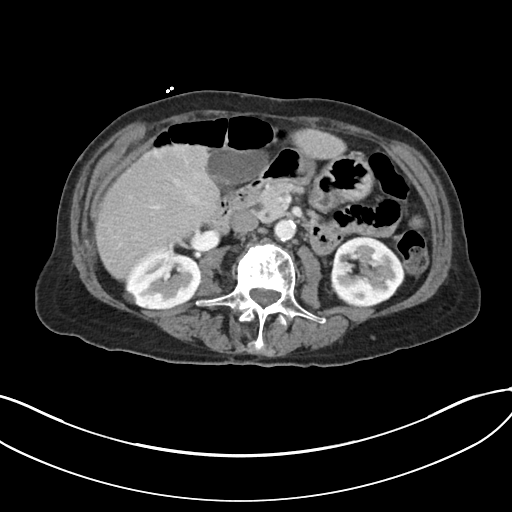
[im 62/86  soft-tissue]
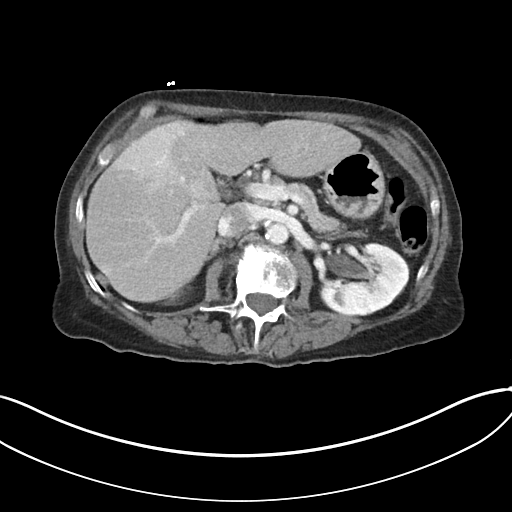
[im 67/86  soft-tissue]
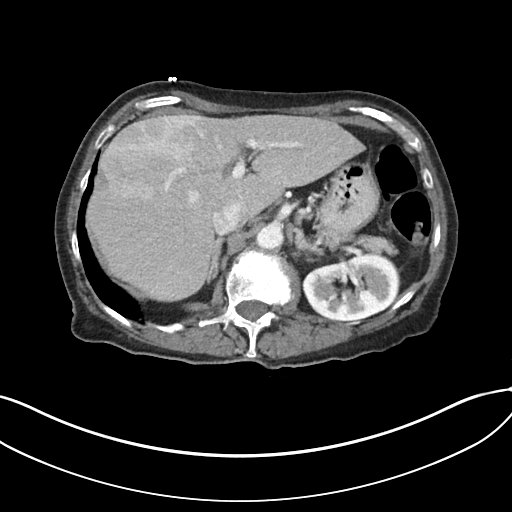
[im 76/86  soft-tissue]
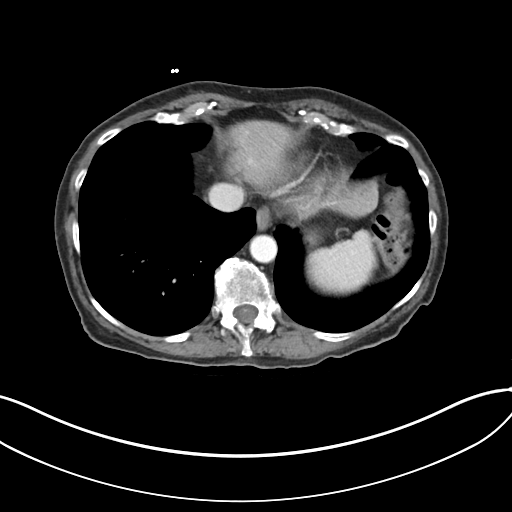
[im 81/86  soft-tissue]
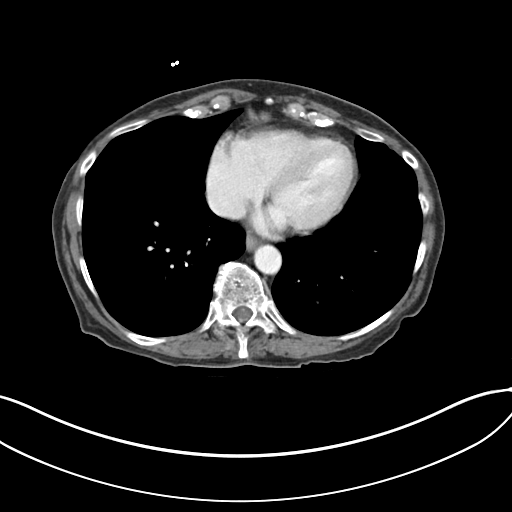

[Series 5: coronal soft tissue · coronal · 0.77mm/px · 3 of 80 slices shown]
[im 27/80  soft-tissue]
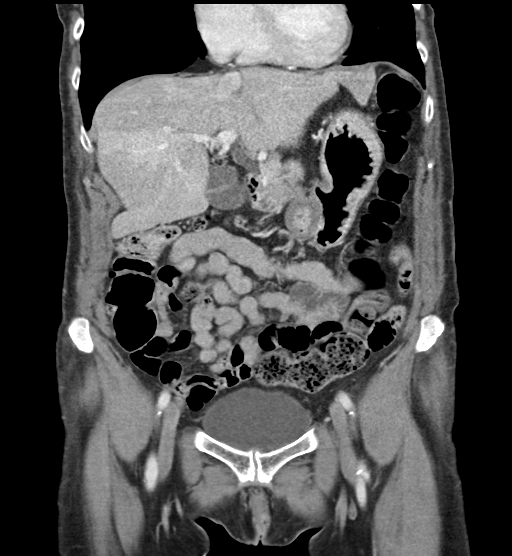
[im 36/80  soft-tissue]
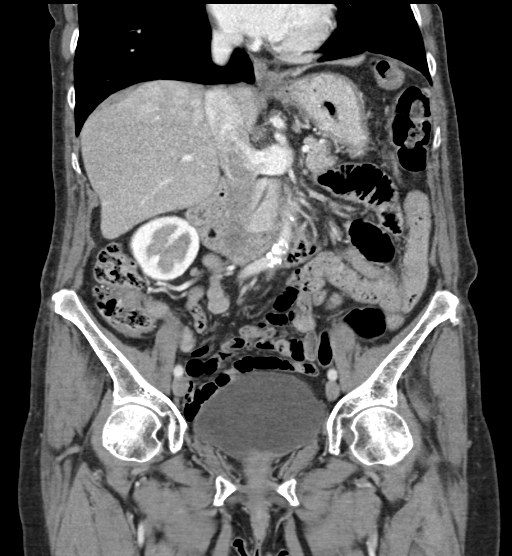
[im 44/80  soft-tissue]
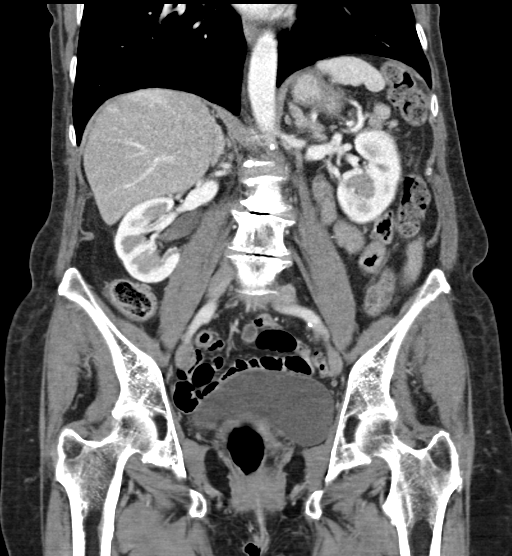

[17 of 46 positions shown; findings below may reference images not displayed]

RADIATION DOSE REDUCTION: This exam was performed according to the
departmental dose-optimization program which includes automated
exposure control, adjustment of the mA and/or kV according to
patient size and/or use of iterative reconstruction technique.

CONTRAST:  100mL OMNIPAQUE IOHEXOL 300 MG/ML  SOLN
FINDINGS: Lower chest: No acute abnormality

Hepatobiliary: Areas of geographic low-density throughout the liver
most compatible with fatty infiltration. Gallbladder unremarkable.

Pancreas: No focal abnormality or ductal dilatation.

Spleen: No focal abnormality.  Normal size.

Adrenals/Urinary Tract: Small bilateral renal cysts. No
hydronephrosis. Adrenal glands unremarkable. Urinary bladder
unremarkable.

Stomach/Bowel: Stomach, large and small bowel grossly unremarkable.
No evidence of bowel obstruction.

Vascular/Lymphatic: Aortic atherosclerosis. No evidence of aneurysm
or adenopathy.

Reproductive: Prior hysterectomy.  No adnexal masses.

Other: No free fluid or free air.

Musculoskeletal: No acute bony abnormality.
IMPRESSION: Geographic fatty infiltration of the liver.

Small subcentimeter renal cysts.

Aortic atherosclerosis.

No acute findings in the abdomen or pelvis.

## 2023-11-17 ENCOUNTER — Ambulatory Visit (HOSPITAL_COMMUNITY)
Admission: RE | Admit: 2023-11-17 | Discharge: 2023-11-17 | Disposition: A | Discharge status: Home / Self Care | Source: Ambulatory Visit | Attending: Nephrology | Admitting: Nephrology

## 2023-11-17 DIAGNOSIS — I5032 Chronic diastolic (congestive) heart failure: Secondary | ICD-10-CM | POA: Insufficient documentation

## 2023-11-17 DIAGNOSIS — I110 Hypertensive heart disease with heart failure: Secondary | ICD-10-CM | POA: Insufficient documentation

## 2023-11-17 DIAGNOSIS — R7303 Prediabetes: Secondary | ICD-10-CM | POA: Insufficient documentation

## 2023-11-17 DIAGNOSIS — E871 Hypo-osmolality and hyponatremia: Secondary | ICD-10-CM | POA: Insufficient documentation

## 2023-11-17 DIAGNOSIS — N200 Calculus of kidney: Secondary | ICD-10-CM | POA: Diagnosis present

## 2023-11-17 DIAGNOSIS — N1330 Unspecified hydronephrosis: Secondary | ICD-10-CM | POA: Insufficient documentation

## 2023-11-17 DIAGNOSIS — I10 Essential (primary) hypertension: Secondary | ICD-10-CM | POA: Insufficient documentation

## 2023-11-17 DIAGNOSIS — E211 Secondary hyperparathyroidism, not elsewhere classified: Secondary | ICD-10-CM | POA: Diagnosis not present

## 2023-11-30 ENCOUNTER — Encounter: Payer: Self-pay | Admitting: Cardiovascular Disease

## 2023-11-30 ENCOUNTER — Ambulatory Visit: Attending: Cardiovascular Disease | Admitting: Cardiovascular Disease

## 2023-11-30 VITALS — BP 112/54 | HR 77 | Ht 65.0 in

## 2023-11-30 DIAGNOSIS — I25118 Atherosclerotic heart disease of native coronary artery with other forms of angina pectoris: Secondary | ICD-10-CM | POA: Diagnosis not present

## 2023-11-30 DIAGNOSIS — I700 Atherosclerosis of aorta: Secondary | ICD-10-CM | POA: Diagnosis not present

## 2023-11-30 DIAGNOSIS — E7800 Pure hypercholesterolemia, unspecified: Secondary | ICD-10-CM | POA: Diagnosis not present

## 2023-11-30 MED ORDER — AMLODIPINE BESYLATE 2.5 MG PO TABS: 2.5000 mg | ORAL_TABLET | Freq: Every day | ORAL | 3 refills | Status: AC

## 2023-11-30 NOTE — Patient Instructions (Signed)
 Medication Instructions:  Decrease Amlodipine  2.5 mg  *If you need a refill on your cardiac medications before your next appointment, please call your pharmacy*  Lab Work: Lipid panel If you have labs (blood work) drawn today and your tests are completely normal, you will receive your results only by: MyChart Message (if you have MyChart) OR A paper copy in the mail If you have any lab test that is abnormal or we need to change your treatment, we will call you to review the results.  Testing/Procedures: None ordered  Follow-Up: At [facility], you and your health needs are our priority.  As part of our continuing mission to provide you with exceptional heart care, our providers are all part of one team.  This team includes your primary Cardiologist (physician) and Advanced Practice Providers or APPs (Physician Assistants and Nurse Practitioners) who all work together to provide you with the care you need, when you need it.  Your next appointment:   1 year(s)  Provider:   Jerel Balding, MD    We recommend signing up for the patient portal called MyChart.  Sign up information is provided on this After Visit Summary.  MyChart is used to connect with patients for Virtual Visits (Telemedicine).  Patients are able to view lab/test results, encounter notes, upcoming appointments, etc.  Non-urgent messages can be sent to your provider as well.   To learn more about what you can do with MyChart, go to [email or web address].

## 2023-11-30 NOTE — Progress Notes (Signed)
 Cardiology Office Note:    Date:  11/30/2023   ID:  Michelle Hubbard, DOB [birth year], MRN [id number]  PCP:  Michelle Lynwood ORN, MD  [facility] Cardiologist:  Jerel Balding, MD [facility] Electrophysiologist:  None   Referring MD: Michelle Lynwood ORN, MD   No chief complaint on file.    History of Present Illness:    Michelle Hubbard is a 78 y.o. female with CAD a hx of CAD, smoking and hypercholesterolemia, returning for follow-up.    Angina is well-controlled with a very low-dose of beta-blockers and amlodipine  intolerant of long-acting nitrates).  She is able to perform housework and lawn care duties without any chest discomfort or shortness of breath.  She can climb the stairs up and down to her basement without difficulty.  She denies palpitations or syncope, but does have some orthostatic dizziness at times.  Successfully quit smoking but has a roughly 45-pack-year history of smoking.  Wheezing occurs rarely.  He denies orthopnea, PND, lower extremity edema, claudication or focal neurological complaints.  She has been discharged from the cancer clinic.  She underwent right breast lumpectomy and external beam radiation therapy and subsequently treatment with Herceptin .  Most recent labs from about a year ago showed HDL 67 and LDL 51, normal triglycerides.  Recent labs from July 2025 showed a hemoglobin A1c of 5.7%.  She has normal renal function.  She was initially referred after a coronary calcium  CT which showed a Agatston score of 100 (91st percentile), the study being performed due to incidental finding of heavy coronary calcification on a screening CT for lung cancer.  She had symptoms of mild stable angina pectoris that she did not recognize as such.  Coronary CT angiography performed in September 2021 showed numerous mild and moderate stenotic lesions, especially in the mid and distal LAD artery and the distal left circumflex coronary artery (the latter lesion being the only  one that appeared to be hemodynamically significant, with CT-FFR 0.73).  She did not tolerate long-acting nitrates, but angina is well-controlled on a combination of beta-blocker and calcium  channel blocker.    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   Breast cancer (HCC) 08-2022   COPD 01/08/2010   DEPRESSIVE DISORDER 12/23/2006   Husband passed   FATIGUE 01/08/2010   HYPERLIPIDEMIA 01/08/2010   Hypertension    LOW BACK PAIN 12/23/2006   OAB (overactive bladder) 10/07/2011   OSTEOPOROSIS 01/08/2010   Other specified forms of hearing loss 01/08/2010   PONV (postoperative nausea and vomiting)    SINUSITIS- ACUTE-NOS 07/09/2007   Urticaria    Vitamin D  deficiency 04/30/2010    Past Surgical History:  Procedure Laterality Date   ABDOMINAL HYSTERECTOMY     APPENDECTOMY  1988   BREAST BIOPSY  1986   BREAST BIOPSY Left 08/18/2022   US  LT BREAST BX W LOC DEV 1ST LESION IMG BX SPEC US  GUIDE 08/18/2022 [facility]   BREAST BIOPSY  09/17/2022   MM LT RADIOACTIVE SEED LOC MAMMO GUIDE 09/17/2022 [facility]   BREAST LUMPECTOMY WITH RADIOACTIVE SEED LOCALIZATION Left 09/18/2022   Procedure: LEFT BREAST SEED LUMPECTOMY;  Surgeon: Vanderbilt Ned, MD;  Location: [facility];  Service: General;  Laterality: Left;   COLON RESECTION N/A 06/03/2013   Procedure: LAPAROSCOPY DIAGNOSTIC and OPEN  SIGMOID COLON RESECTION ;  Surgeon: Lynda Leos, MD;  Location: [facility];  Service: General;  Laterality: N/A;   COLON  SURGERY  2000   colon obstruction   larygenscopy     OOPHORECTOMY  bilat 1988   secondary to cyst    Current Medications: Current Meds  Medication Sig   Acetaminophen  (TYLENOL  PO) Take 500 mg by mouth daily as needed.   amoxicillin (AMOXIL) 500 MG capsule Take 500 mg by mouth 3 (three) times daily.   aspirin  EC 81 MG tablet Take by mouth.   Cholecalciferol (VITAMIN D3) 50 MCG (2000 UT) TABS Take 50  mcg by mouth daily.   Coenzyme Q10 (COQ10) 100 MG CAPS Take 3 capsules by mouth daily.   levocetirizine (XYZAL ) 5 MG tablet Take 5 mg by mouth every evening.   metoprolol  succinate (TOPROL -XL) 25 MG 24 hr tablet TAKE 1/2 (ONE-HALF) TABLET BY MOUTH ONCE DAILY WITH OR IMMEDIATELY FOLLOWING A MEAL   omeprazole  (PRILOSEC) 20 MG capsule Take 20 mg by mouth daily. Takes once every 3 days   polyethylene glycol (MIRALAX / GLYCOLAX) 17 g packet Take 17 g by mouth daily.   rosuvastatin  (CRESTOR ) 20 MG tablet Take 1 tablet (20 mg total) by mouth daily.   temazepam  (RESTORIL ) 30 MG capsule Take 1 capsule (30 mg total) by mouth at bedtime as needed for sleep.   Triamcinolone Acetonide (NASACORT ALLERGY 24HR NA) Place 1 spray into the nose daily.   [DISCONTINUED] amLODipine  (NORVASC ) 5 MG tablet Take 1 tablet (5 mg total) by mouth daily.     Allergies:   Codeine, Lortab [hydrocodone -acetaminophen ], and Penicillins   Family History: The patient's family history includes Colon cancer in her brother and mother; Diabetes in her maternal aunt and maternal grandmother; Migraines in her father. There is no history of Allergic rhinitis, Angioedema, Asthma, Eczema, Immunodeficiency, or Urticaria.  ROS:   Please see the history of present illness.     All other systems reviewed and are negative.  EKGs/Labs/Other Studies Reviewed:    The following studies were reviewed today: Coronary CT and CT FFR  EKG:    EKG Interpretation Date/Time:  Monday November 30 2023 11:47:39 EST Ventricular Rate:  77 PR Interval:  148 QRS Duration:  76 QT Interval:  382 QTC Calculation: 432 R Axis:   54  Text Interpretation: Normal sinus rhythm Normal ECG When compared with ECG of 17-Sep-2022 11:30, No significant change was found Confirmed by Rosealyn Little ([id number]) on 11/30/2023 11:59:37 AM         Recent Labs: 05/14/2023: Magnesium 1.8 09/24/2023: ALT 18; BUN 15; Creatinine, Ser 0.52; Hemoglobin 14.0; Platelets 335;  Potassium 4.3; Sodium 132  Recent Lipid Panel    Component Value Date/Time   CHOL 131 07/29/2022 1141   CHOL 129 06/05/2020 0835   TRIG 66.0 07/29/2022 1141   HDL 67.40 07/29/2022 1141   HDL 62 06/05/2020 0835   CHOLHDL 2 07/29/2022 1141   VLDL 13.2 07/29/2022 1141   LDLCALC 51 07/29/2022 1141   LDLCALC 54 06/05/2020 0835   LDLCALC 119 (H) 08/16/2019 1518   LDLDIRECT 146.3 10/07/2011 0919    Physical Exam:    VS:  BP (!) 112/54 (BP Location: Left Arm, Patient Position: Sitting, Cuff Size: Normal)   Pulse 77   Ht 5' 5 (1.651 m)   SpO2 94%   BMI 20.50 kg/m     Wt Readings from Last 3 Encounters:  10/15/23 123 lb 3.2 oz (55.9 kg)  09/24/23 119 lb 6.4 oz (54.2 kg)  09/21/23 123 lb (55.8 kg)      General: Alert, oriented x3, no distress, very lean  Head: no evidence of trauma, PERRL, EOMI, no exophtalmos or lid lag, no myxedema, no xanthelasma; normal ears, nose and oropharynx Neck: normal jugular venous pulsations and no hepatojugular reflux; brisk carotid pulses without delay and no carotid bruits Chest: clear to auscultation, no signs of consolidation by percussion or palpation, normal fremitus, symmetrical and full respiratory excursions Cardiovascular: normal position and quality of the apical impulse, regular rhythm, normal first and second heart sounds, no murmurs, rubs or gallops Abdomen: no tenderness or distention, no masses by palpation, no abnormal pulsatility or arterial bruits, normal bowel sounds, no hepatosplenomegaly Extremities: no clubbing, cyanosis or edema; 2+ radial, ulnar and brachial pulses bilaterally; 2+ right femoral, posterior tibial and dorsalis pedis pulses; 2+ left femoral, posterior tibial and dorsalis pedis pulses; no subclavian or femoral bruits Neurological: grossly nonfocal Psych: Normal mood and affect     ASSESSMENT:    1. Coronary artery disease of native artery of native heart with stable angina pectoris   2. Hypercholesterolemia    3. Atherosclerosis of aorta       PLAN:    In order of problems listed above:  CAD/stable angina pectoris: Well-controlled on 2 antianginals, but with periods of low diastolic blood pressure and occasional dizziness.  Will decrease the amlodipine  to 2.5 mg once daily..  On medical therapy, since the CT angiography did not show high risk anatomy.  On aspirin  and statin. HLP: Recheck lipid profile this year.  Continue statin. Smoking: Congratulated her on quitting smoking. Atherosclerosis of the aorta: Incidentally noted on CT, normal caliber aorta and no clinical symptoms of PAD.   Breast cancer on trastuzumab : Normal LV function after completion of trastuzumab  therapy.   Medication Adjustments/Labs and Tests Ordered: Current medicines are reviewed at length with the patient today.  Concerns regarding medicines are outlined above.  Orders Placed This Encounter  Procedures   Lipid panel   EKG 12-Lead    Meds ordered this encounter  Medications   amLODipine  (NORVASC ) 2.5 MG tablet    Sig: Take 1 tablet (2.5 mg total) by mouth daily.    Dispense:  90 tablet    Refill:  3     Patient Instructions  Medication Instructions:  Decrease Amlodipine  2.5 mg  *If you need a refill on your cardiac medications before your next appointment, please call your pharmacy*  Lab Work: Lipid panel If you have labs (blood work) drawn today and your tests are completely normal, you will receive your results only by: MyChart Message (if you have MyChart) OR A paper copy in the mail If you have any lab test that is abnormal or we need to change your treatment, we will call you to review the results.  Testing/Procedures: None ordered  Follow-Up: At [facility], you and your health needs are our priority.  As part of our continuing mission to provide you with exceptional heart care, our providers are all part of one team.  This team includes your primary Cardiologist (physician) and  Advanced Practice Providers or APPs (Physician Assistants and Nurse Practitioners) who all work together to provide you with the care you need, when you need it.  Your next appointment:   1 year(s)  Provider:   Jerel Balding, MD    We recommend signing up for the patient portal called MyChart.  Sign up information is provided on this After Visit Summary.  MyChart is used to connect with patients for Virtual Visits (Telemedicine).  Patients are able to view lab/test results, encounter notes,  upcoming appointments, etc.  Non-urgent messages can be sent to your provider as well.   To learn more about what you can do with MyChart, go to [email or web address].      Signed, Jerel Balding, MD  11/30/2023 1:15 PM    [facility]

## 2023-12-10 ENCOUNTER — Ambulatory Visit: Payer: Self-pay | Admitting: Cardiovascular Disease

## 2023-12-10 LAB — LIPID PANEL
Chol/HDL Ratio: 1.9 ratio (ref 0.0–4.4)
Cholesterol, Total: 124 mg/dL (ref 100–199)
HDL: 64 mg/dL (ref >39)
LDL Chol Calc (NIH): 48 mg/dL (ref 0–99)
Triglycerides: 52 mg/dL (ref 0–149)
VLDL Cholesterol Cal: 12 mg/dL (ref 5–40)

## 2023-12-22 ENCOUNTER — Other Ambulatory Visit: Payer: Self-pay | Admitting: Cardiovascular Disease

## 2023-12-28 ENCOUNTER — Other Ambulatory Visit: Payer: Self-pay | Admitting: *Deleted

## 2023-12-28 DIAGNOSIS — C50412 Malignant neoplasm of upper-outer quadrant of left female breast: Secondary | ICD-10-CM

## 2023-12-28 NOTE — Progress Notes (Signed)
 Orders placed for labs

## 2023-12-29 ENCOUNTER — Inpatient Hospital Stay: Attending: Licensed Clinical Social Worker

## 2023-12-29 ENCOUNTER — Inpatient Hospital Stay: Admitting: Oncology

## 2023-12-29 VITALS — BP 123/66 | HR 86 | Temp 97.7°F | Resp 18 | Ht 65.0 in | Wt 128.4 lb

## 2023-12-29 DIAGNOSIS — M8000XA Age-related osteoporosis with current pathological fracture, unspecified site, initial encounter for fracture: Secondary | ICD-10-CM | POA: Diagnosis not present

## 2023-12-29 DIAGNOSIS — C50412 Malignant neoplasm of upper-outer quadrant of left female breast: Secondary | ICD-10-CM

## 2023-12-29 DIAGNOSIS — E871 Hypo-osmolality and hyponatremia: Secondary | ICD-10-CM

## 2023-12-29 DIAGNOSIS — Z853 Personal history of malignant neoplasm of breast: Secondary | ICD-10-CM | POA: Insufficient documentation

## 2023-12-29 DIAGNOSIS — M810 Age-related osteoporosis without current pathological fracture: Secondary | ICD-10-CM | POA: Diagnosis not present

## 2023-12-29 DIAGNOSIS — Z923 Personal history of irradiation: Secondary | ICD-10-CM | POA: Insufficient documentation

## 2023-12-29 DIAGNOSIS — Z170 Estrogen receptor positive status [ER+]: Secondary | ICD-10-CM | POA: Diagnosis not present

## 2023-12-29 LAB — CBC WITH DIFFERENTIAL/PLATELET
Abs Immature Granulocytes: 0.01 K/uL (ref 0.00–0.07)
Basophils Absolute: 0 K/uL (ref 0.0–0.1)
Basophils Relative: 0 %
Eosinophils Absolute: 0.1 K/uL (ref 0.0–0.5)
Eosinophils Relative: 2 %
HCT: 42.7 % (ref 36.0–46.0)
Hemoglobin: 13.8 g/dL (ref 12.0–15.0)
Immature Granulocytes: 0 %
Lymphocytes Relative: 16 %
Lymphs Abs: 1.1 K/uL (ref 0.7–4.0)
MCH: 31.7 pg (ref 26.0–34.0)
MCHC: 32.3 g/dL (ref 30.0–36.0)
MCV: 97.9 fL (ref 80.0–100.0)
Monocytes Absolute: 0.8 K/uL (ref 0.1–1.0)
Monocytes Relative: 12 %
Neutro Abs: 5 K/uL (ref 1.7–7.7)
Neutrophils Relative %: 70 %
Platelets: 307 K/uL (ref 150–400)
RBC: 4.36 MIL/uL (ref 3.87–5.11)
RDW: 13.2 % (ref 11.5–15.5)
WBC: 7.1 K/uL (ref 4.0–10.5)
nRBC: 0 % (ref 0.0–0.2)

## 2023-12-29 LAB — COMPREHENSIVE METABOLIC PANEL WITH GFR
ALT: 19 U/L (ref 0–44)
AST: 29 U/L (ref 15–41)
Albumin: 4.7 g/dL (ref 3.5–5.0)
Alkaline Phosphatase: 58 U/L (ref 38–126)
Anion gap: 15 (ref 5–15)
BUN: 16 mg/dL (ref 8–23)
CO2: 24 mmol/L (ref 22–32)
Calcium: 8.9 mg/dL (ref 8.9–10.3)
Chloride: 99 mmol/L (ref 98–111)
Creatinine, Ser: 0.55 mg/dL (ref 0.44–1.00)
GFR, Estimated: >60 mL/min (ref >60)
Glucose, Bld: 108 mg/dL — ABNORMAL HIGH (ref 70–99)
Potassium: 4.2 mmol/L (ref 3.5–5.1)
Sodium: 137 mmol/L (ref 135–145)
Total Bilirubin: 0.2 mg/dL (ref 0.0–1.2)
Total Protein: 7.8 g/dL (ref 6.5–8.1)

## 2023-12-29 NOTE — Progress Notes (Signed)
 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  Currently stopped taking tamoxifen  for hot flashes.  Assessment and Plan  Malignant neoplasm of upper-outer quadrant of left breast in female, estrogen receptor positive (HCC) 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   Patient was not given chemotherapy adjuvantly for concerns of potential toxicity considering her age. Patient intermittently stopped taking tamoxifen  on 05/06/2023 attributing it to severe hot flashes.  She is reluctant to try SSRIs for hot flashes. Patient restarted taking tamoxifen  on 07/06/2023, but was only taking 5 mg daily. Patient reported stopping taking tamoxifen  around 11/2023 for intolerance Completed 1 year of adjuvant trastuzumab .    -Completed Trastuzumab  infusions. Patient stopped taking tamoxifen  at this time. Discussed risk versus benefits of stopping tamoxifen  including the risk of recurrence.  -Recent echo from 04/07/2023 showed good EF.   - Plan for mammogram at 1 year mark that is July 2026.  Repeat yearly - Physical exam stable today. No breast lesions  palpated. - Labs reviewed today.  WNL.   Return to clinic in 3 months for physical exam and follow-up  Osteoporosis, unspecified osteoporosis type, unspecified pathological fracture presence Patient is a history of osteoporosis and has received denosumab  before.   -Considered treatment with tamoxifen  instead of AI for this reason.   Hyponatremia Patient had chronic hyponatremia for at least a year now. Evaluated by nephrology and was recommended fluid restriction  Resolved at this time   The patient understands the plans discussed today and is in agreement with them.  She knows to contact our office if she develops concerns prior to her next appointment.  The total time spent in the appointment was 20 minutes for the encounter with patient, including review of chart and various tests results, discussions about plan of care and coordination of care plan    I,Helena R Teague,acting as a scribe for Siad Davonna, MD.,have documented all relevant documentation on the behalf of Siad Davonna, MD,as directed by  Siad Davonna, MD while in the presence of Siad Davonna, MD.  I, Siad Davonna MD, have reviewed the above documentation for accuracy and completeness, and I agree with the above.     Siad Davonna, MD  [facility] [place] - A DEPT OF [facility] [address] [place] [address] Dept: [phone] Dept Fax: [phone]    ONCOLOGY HISTORY:   Diagnosis: Stage 1a left breast carcinoma, ER/HER2 positive  -08/06/2022: Bilateral Screening Mammogram: Further evaluation is suggested for a possible mass in the left breast. -08/14/2022: Left diagnostic mammogram and US : Suspicious 1.3 cm mass in the left breast at the 12 o'clock position. -08/18/2022:  Left breast mass biopsy.  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 No DCIS identified. No lymphovascular invasion or calcifications  present. Tumor cells are ER positive (95%), PR negative, and Her2 positive by FISH and IHC (3+). Ki-67 15%.  -09/18/2022: Left breast lumpectomy.  Pathology:  Invasive ductal carcinoma with micropapillary features, 1.2 cm, grade 2. Intermediate grade DCIS present, cribriform type, with calcifications. No lymphovascular invasion identified. Staging: pT1c, pNx. Repeat prognostic indicators were the same.  -10/20/2022: Bone Density scan: T-score of -2.8.  -11/10/2022 - 11/14/2022: Radiation therapy to left breast mass completed -11/07/2022-10/15/2023: 1 year of trastuzumab .   -11/07/2022- 11/2023:Started on tamoxifen . Patient took it intermittently owing to side effects and discontinued in 11/2023.  -08/07/2023: Bilateral diagnostic mammogram: Benign post treatment changes of the left breast.    Current Treatment:  Surveillance  INTERVAL HISTORY:  CATHERN TAHIR is here today for follow up. Patient is unaccompanied today.   Brilee stopped taking tamoxifen  one month ago due to the side effects of hot flashes and leg cramps. Her leg cramps have resolved and her hot flashes have improved though not resolved and are worse at night than during the day.   Sinia has been seen by nephrology for hyponatremia who recommended she drink 36 oz of water a day. She has follow-up with Dr.Bhutani on 02/05/2024.     She stopped smoking 4 months ago due to a cough.   I have reviewed the past medical history, past surgical history, social history and family history with the patient and they are unchanged from previous note.  ALLERGIES:  is allergic to codeine, lortab [hydrocodone -acetaminophen ], and penicillins.  MEDICATIONS:  Current Outpatient Medications  Medication Sig Dispense Refill   Acetaminophen  (TYLENOL  PO) Take 500 mg by mouth daily as needed.     amLODipine  (NORVASC ) 2.5 MG tablet Take 1 tablet (2.5 mg total) by mouth daily. 90 tablet 3   amoxicillin (AMOXIL) 500 MG capsule Take 500 mg by mouth 3  (three) times daily.     aspirin  EC 81 MG tablet Take by mouth.     Cholecalciferol (VITAMIN D3) 50 MCG (2000 UT) TABS Take 50 mcg by mouth daily.     Coenzyme Q10 (COQ10) 100 MG CAPS Take 3 capsules by mouth daily. 30 capsule    diazepam (VALIUM) 2 MG tablet Take 2 mg by mouth 3 (three) times daily as needed. (Patient not taking: Reported on 11/30/2023)     levocetirizine (XYZAL ) 5 MG tablet Take 5 mg by mouth every evening.     metoprolol  succinate (TOPROL -XL) 25 MG 24 hr tablet TAKE 1/2 (ONE-HALF) TABLET BY MOUTH ONCE DAILY WITH OR IMMEDIATELY FOLLOWING A MEAL*NEEDS APPOINTMENT FOR FUTRE REFILLS* 45 tablet 0   nitroGLYCERIN  (NITROSTAT ) 0.4 MG SL tablet DISSOLVE ONE TABLET UNDER THE TONGUE EVERY 5 MINUTES AS NEEDED FOR CHEST PAIN.  DO NOT EXCEED A TOTAL OF 3 DOSES IN 15 MINUTES (Patient not taking: Reported on 11/30/2023) 25 tablet 3   omeprazole  (PRILOSEC) 20 MG capsule Take 20 mg by mouth daily. Takes once every 3 days     polyethylene glycol (MIRALAX / GLYCOLAX) 17 g packet Take 17 g by mouth daily.     rosuvastatin  (CRESTOR ) 20 MG tablet Take 1 tablet (20 mg total) by mouth daily. 90 tablet 3   temazepam  (RESTORIL ) 30 MG capsule Take 1 capsule (30 mg total) by mouth at bedtime as needed for sleep. 90 capsule 1   Triamcinolone Acetonide (NASACORT ALLERGY 24HR NA) Place 1  spray into the nose daily.     No current facility-administered medications for this visit.   REVIEW OF SYSTEMS:   All the systems were reviewed with the patient and are negative except HPI.   VITALS:  Blood pressure 123/66, pulse 86, temperature 97.7 F (36.5 C), temperature source Tympanic, resp. rate 18, height 5' 5 (1.651 m), weight 128 lb 6.4 oz (58.2 kg), SpO2 93%.  Wt Readings from Last 3 Encounters:  12/29/23 128 lb 6.4 oz (58.2 kg)  10/15/23 123 lb 3.2 oz (55.9 kg)  09/24/23 119 lb 6.4 oz (54.2 kg)    Body mass index is 21.37 kg/m.  Performance status (ECOG): 1 - Symptomatic but completely  ambulatory  PHYSICAL EXAM:   GENERAL:alert, no distress and comfortable LUNGS: clear to auscultation and percussion with normal breathing effort BREAST: No abnormal lumps palpated, no discharge.  Left breast with lumpectomy scar, right breast with previous breast biopsy scar.  No palpable axillary lymphadenopathy HEART: regular rate & rhythm and no murmurs and no lower extremity edema ABDOMEN:abdomen soft, non-tender and normal bowel sounds Musculoskeletal:no cyanosis of digits and no clubbing  NEURO: alert & oriented x 3 with fluent speech  LABORATORY DATA:  I have reviewed the data as listed  Lab Results  Component Value Date   WBC 7.1 12/29/2023   NEUTROABS 5.0 12/29/2023   HGB 13.8 12/29/2023   HCT 42.7 12/29/2023   MCV 97.9 12/29/2023   PLT 307 12/29/2023      Chemistry      Component Value Date/Time   NA 137 12/29/2023 1333   NA 136 10/14/2019 1155   K 4.2 12/29/2023 1333   CL 99 12/29/2023 1333   CO2 24 12/29/2023 1333   BUN 16 12/29/2023 1333   BUN 13 10/14/2019 1155   CREATININE 0.55 12/29/2023 1333   CREATININE 0.59 12/19/2022 1114   CREATININE 0.71 08/16/2019 1518      Component Value Date/Time   CALCIUM  8.9 12/29/2023 1333   CALCIUM  9.7 10/07/2011 0919   ALKPHOS 58 12/29/2023 1333   AST 29 12/29/2023 1333   AST 18 12/19/2022 1114   ALT 19 12/29/2023 1333   ALT 12 12/19/2022 1114   BILITOT 0.2 12/29/2023 1333   BILITOT 0.4 12/19/2022 1114

## 2024-01-05 ENCOUNTER — Encounter: Payer: Self-pay | Admitting: Oncology
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
## 2024-01-14 ENCOUNTER — Encounter: Payer: Self-pay | Admitting: Student

## 2024-01-14 ENCOUNTER — Ambulatory Visit

## 2024-01-14 ENCOUNTER — Other Ambulatory Visit: Payer: Self-pay | Admitting: Student

## 2024-01-14 ENCOUNTER — Encounter: Payer: Self-pay | Admitting: Internal Medicine

## 2024-01-14 ENCOUNTER — Ambulatory Visit: Admitting: Student

## 2024-01-14 ENCOUNTER — Ambulatory Visit (INDEPENDENT_AMBULATORY_CARE_PROVIDER_SITE_OTHER): Admitting: Internal Medicine

## 2024-01-14 VITALS — BP 114/72 | HR 79 | Ht 65.0 in | Wt 125.2 lb

## 2024-01-14 VITALS — BP 136/78 | HR 78 | Temp 98.3°F | Ht 65.0 in | Wt 126.0 lb

## 2024-01-14 DIAGNOSIS — I25118 Atherosclerotic heart disease of native coronary artery with other forms of angina pectoris: Secondary | ICD-10-CM | POA: Diagnosis not present

## 2024-01-14 DIAGNOSIS — J438 Other emphysema: Secondary | ICD-10-CM

## 2024-01-14 DIAGNOSIS — R55 Syncope and collapse: Secondary | ICD-10-CM

## 2024-01-14 DIAGNOSIS — R739 Hyperglycemia, unspecified: Secondary | ICD-10-CM

## 2024-01-14 DIAGNOSIS — E785 Hyperlipidemia, unspecified: Secondary | ICD-10-CM

## 2024-01-14 DIAGNOSIS — R42 Dizziness and giddiness: Secondary | ICD-10-CM

## 2024-01-14 DIAGNOSIS — R5383 Other fatigue: Secondary | ICD-10-CM

## 2024-01-14 DIAGNOSIS — E559 Vitamin D deficiency, unspecified: Secondary | ICD-10-CM | POA: Diagnosis not present

## 2024-01-14 DIAGNOSIS — I10 Essential (primary) hypertension: Secondary | ICD-10-CM | POA: Diagnosis not present

## 2024-01-14 DIAGNOSIS — Z01818 Encounter for other preprocedural examination: Secondary | ICD-10-CM

## 2024-01-14 DIAGNOSIS — S022XXA Fracture of nasal bones, initial encounter for closed fracture: Secondary | ICD-10-CM | POA: Insufficient documentation

## 2024-01-14 DIAGNOSIS — R1033 Periumbilical pain: Secondary | ICD-10-CM | POA: Insufficient documentation

## 2024-01-14 DIAGNOSIS — S022XXD Fracture of nasal bones, subsequent encounter for fracture with routine healing: Secondary | ICD-10-CM

## 2024-01-14 DIAGNOSIS — S52601E Unspecified fracture of lower end of right ulna, subsequent encounter for open fracture type I or II with routine healing: Secondary | ICD-10-CM

## 2024-01-14 MED ORDER — ROSUVASTATIN CALCIUM 20 MG PO TABS: 20.0000 mg | ORAL_TABLET | Freq: Every day | ORAL | 3 refills | Status: AC

## 2024-01-14 NOTE — Progress Notes (Unsigned)
 ZIO AT serial # [id number] from office inventory applied to patient. Dr. Francyne to read.

## 2024-01-14 NOTE — Patient Instructions (Addendum)
 Please continue all other medications as before, and refills have been done if requested.  Please have the pharmacy call with any other refills you may need.  Please continue your efforts at being more active, low cholesterol diet, and weight control.  You are otherwise up to date with prevention measures today.  Please keep your appointments with your specialists as you may have planned - heart monitor and Echo  You will be contacted regarding the referral for: Neurology  We will fill out the forms to fax then mail to you  You have lab testing for tomorrow.  Please make an Appointment to return in 6 months, or sooner if needed

## 2024-01-14 NOTE — Patient Instructions (Addendum)
 Thank you for choosing [facility]!     Medication Instructions:  No medication changes were made during today's visit.  *If you need a refill on your cardiac medications before your next appointment, please call your pharmacy*   Lab Work: TSH If you have labs (blood work) drawn today and your tests are completely normal, you will receive your results only by: MyChart Message (if you have MyChart) OR A paper copy in the mail If you have any lab test that is abnormal or we need to change your treatment, we will call you to review the results.   Testing/Procedures: Your physician has requested that you have an echocardiogram. Echocardiography is a painless test that uses sound waves to create images of your heart. It provides your doctor with information about the size and shape of your heart and how well your hearts chambers and valves are working. This procedure takes approximately one hour. There are no restrictions for this procedure.   Please do NOT wear cologne, perfume, aftershave, or lotions (deodorant is allowed).   Please arrive 15 minutes prior to your appointment time.   ZIO XT- Long Term Monitor Instructions   Your physician has requested you wear your ZIO patch monitor 14 day LIVE.   This is a single patch monitor.  [employer] supplies one patch monitor per enrollment.  Additional stickers are not available.   Please do not apply patch if you will be having a Nuclear Stress Test, Echocardiogram, Cardiac CT, MRI, or Chest Xray during the time frame you would be wearing the monitor. The patch cannot be worn during these tests.  You cannot remove and re-apply the ZIO XT patch monitor.   Your ZIO patch monitor will be sent USPS Priority mail from [facility] directly to your home address. The monitor may also be mailed to a PO BOX if home delivery is not available.   It may take 3-5 days to receive your monitor after you have been enrolled.   Once you have  received you monitor, please review enclosed instructions.  Your monitor has already been registered assigning a specific monitor serial # to you.   Applying the monitor   Shave hair from upper left chest.   Hold abrader disc by orange tab.  Rub abrader in 40 strokes over left upper chest as indicated in your monitor instructions.   Clean area with 4 enclosed alcohol pads .  Use all pads to assure are is cleaned thoroughly.  Let dry.   Apply patch as indicated in monitor instructions.  Patch will be place under collarbone on left side of chest with arrow pointing upward.   Rub patch adhesive wings for 2 minutes.Remove white label marked 1.  Remove white label marked 2.  Rub patch adhesive wings for 2 additional minutes.   While looking in a mirror, press and release button in center of patch.  A small green light will flash 3-4 times .  This will be your only indicator the monitor has been turned on.     Do not shower for the first 24 hours.  You may shower after the first 24 hours.   Press button if you feel a symptom. You will hear a small click.  Record Date, Time and Symptom in the Patient Log Book.   When you are ready to remove patch, follow instructions on last 2 pages of Patient Log Book.  Stick patch monitor onto last page of Patient Log Book.   Place  Patient Log Book in [facility] box.  Use locking tab on box and tape box closed securely.  The [employer] has [employer] on it.  Please place in mailbox as soon as possible.  Your physician should have your test results approximately 7 days after the monitor has been mailed back to [facility].   Call [facility]  Customer Care at [phone] if you have questions regarding your ZIO XT patch monitor.  Call them immediately if you see an orange light blinking on your monitor.   If your monitor falls off in less than 4 days contact our Monitor department at [phone].  If your monitor becomes loose or falls off  after 4 days call [employer] at [phone] for suggestions on securing your monitor.    Your next appointment:  AFTER ECHO ONLY 2 month(s)   Provider:   Jerel Balding, MD, Al[facility] Door or [place] Location     Follow-Up: At [facility] you and your health needs are our priority.  As part of our continuing mission to provide you with exceptional heart care, we have created designated Provider Care Teams.  These Care Teams include your primary Cardiologist (physician) and Advanced Practice Providers (APPs -  Physician Assistants and Nurse Practitioners) who all work together to provide you with the care you need, when you need it. We recommend signing up for the patient portal called MyChart.  Sign up information is provided on this After Visit Summary.  MyChart is used to connect with patients for Virtual Visits (Telemedicine).  Patients are able to view lab/test results, encounter notes, upcoming appointments, etc.  Non-urgent messages can be sent to your provider as well.   To learn more about what you can do with MyChart, go to [email or web address].

## 2024-01-14 NOTE — Progress Notes (Unsigned)
 Patient ID: Michelle Hubbard, female   DOB: 08-Jul-1945, 78 y.o.   MRN: [id number]        Chief Complaint: follow up HTN, HLD and hyperglycemia ***       HPI:  MUNTAHA Hubbard is a 78 y.o. 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   Breast cancer (HCC) 08-2022   COPD 01/08/2010   DEPRESSIVE DISORDER 12/23/2006   Husband passed   FATIGUE 01/08/2010   HYPERLIPIDEMIA 01/08/2010   Hypertension    LOW BACK PAIN 12/23/2006   OAB (overactive bladder) 10/07/2011   OSTEOPOROSIS 01/08/2010   Other specified forms of hearing loss 01/08/2010   PONV (postoperative nausea and vomiting)    SINUSITIS- ACUTE-NOS 07/09/2007   Urticaria    Vitamin D  deficiency 04/30/2010   Past Surgical History:  Procedure Laterality Date   ABDOMINAL HYSTERECTOMY     APPENDECTOMY  1988   BREAST BIOPSY  1986   BREAST BIOPSY Left 08/18/2022   US  LT BREAST BX W LOC DEV 1ST LESION IMG BX SPEC US  GUIDE 08/18/2022 [facility]   BREAST BIOPSY  09/17/2022   MM LT RADIOACTIVE SEED LOC MAMMO GUIDE 09/17/2022 [facility]   BREAST LUMPECTOMY WITH RADIOACTIVE SEED LOCALIZATION Left 09/18/2022   Procedure: LEFT BREAST SEED LUMPECTOMY;  Surgeon: Vanderbilt Ned, MD;  Location: [facility];  Service: General;  Laterality: Left;   COLON RESECTION N/A 06/03/2013   Procedure: LAPAROSCOPY DIAGNOSTIC and OPEN  SIGMOID COLON RESECTION ;  Surgeon: Lynda Leos, MD;  Location: [facility];  Service: General;  Laterality: N/A;   COLON SURGERY  2000   colon obstruction   larygenscopy     OOPHORECTOMY  bilat 1988   secondary to cyst    reports that she quit smoking about 3 months ago. Her smoking  use included cigarettes. She has a 44 pack-year smoking history. She has never used smokeless tobacco. She reports current alcohol use of about 7.0 standard drinks of alcohol per week. She reports that she does not use drugs. family history includes Colon cancer in her brother and mother; Diabetes in her maternal aunt and maternal grandmother; Migraines in her father. Allergies[1] Medications Ordered Prior to Encounter[2]      ROS:  All others reviewed and negative.  Objective        PE:  BP 136/78 (BP Location: Left Arm, Patient Position: Sitting, Cuff Size: Normal)   Pulse 78   Temp 98.3 F (36.8 C) (Oral)   Ht 5' 5 (1.651 m)   Wt 126 lb (57.2 kg)   SpO2 97%   BMI 20.97 kg/m                 Constitutional: Pt appears in NAD               HENT: Head: NCAT.                Right Ear: External ear normal.                 Left Ear: External ear normal.  Eyes: . Pupils are equal, round, and reactive to light. Conjunctivae and EOM are normal               Nose: without d/c or deformity               Neck: Neck supple. Gross normal ROM               Cardiovascular: Normal rate and regular rhythm.                 Pulmonary/Chest: Effort normal and breath sounds without rales or wheezing.                Abd:  Soft, NT, ND, + BS, no organomegaly               Neurological: Pt is alert. At baseline orientation, motor grossly intact               Skin: Skin is warm. No rashes, no other new lesions, LE edema - ***               Psychiatric: Pt behavior is normal without agitation   Micro: none  Cardiac tracings I have personally interpreted today:  none  Pertinent Radiological findings (summarize): none   Lab Results  Component Value Date   WBC 7.1 12/29/2023   HGB 13.8 12/29/2023   HCT 42.7 12/29/2023   PLT 307 12/29/2023   GLUCOSE 108 (H) 12/29/2023   CHOL 124 12/09/2023   TRIG 52 12/09/2023   HDL 64 12/09/2023   LDLDIRECT 146.3 10/07/2011   LDLCALC 48 12/09/2023    ALT 19 12/29/2023   AST 29 12/29/2023   NA 137 12/29/2023   K 4.2 12/29/2023   CL 99 12/29/2023   CREATININE 0.55 12/29/2023   BUN 16 12/29/2023   CO2 24 12/29/2023   TSH 1.79 07/29/2022   INR 0.96 05/27/2013   HGBA1C 5.7 (A) 08/05/2023   Assessment/Plan:  Michelle Hubbard is a 78 y.o. White or Caucasian [1] female with  has a past medical history of Allergy, Asthma-COPD overlap syndrome (HCC) (01/15/2016), Breast cancer (HCC) ([id number]), COPD (01/08/2010), DEPRESSIVE DISORDER (12/23/2006), FATIGUE (01/08/2010), HYPERLIPIDEMIA (01/08/2010), Hypertension, LOW BACK PAIN (12/23/2006), OAB (overactive bladder) (10/07/2011), OSTEOPOROSIS (01/08/2010), Other specified forms of hearing loss (01/08/2010), PONV (postoperative nausea and vomiting), SINUSITIS- ACUTE-NOS (07/09/2007), Urticaria, and Vitamin D  deficiency (04/30/2010).  No problem-specific Assessment & Plan notes found for this encounter.  Followup: No follow-ups on file.  Lynwood Rush, MD 01/14/2024 3:13 PM [facility]    [1]  Allergies Allergen Reactions   Codeine     REACTION: nausea   Lortab [Hydrocodone -Acetaminophen ] Hives   Penicillins Rash  [2]  Current Outpatient Medications on File Prior to Visit  Medication Sig Dispense Refill   Acetaminophen  (TYLENOL  PO) Take 500 mg by mouth daily as needed.     amLODipine  (NORVASC ) 2.5 MG tablet Take 1 tablet (2.5 mg total) by mouth daily. 90 tablet 3   amoxicillin (AMOXIL) 500 MG capsule Take 500 mg by mouth 3 (three) times daily.     aspirin  EC 81 MG tablet Take by mouth.     Cholecalciferol (VITAMIN D3) 50 MCG (2000 UT) TABS Take 50 mcg by mouth daily.     Coenzyme Q10 (COQ10) 100 MG CAPS Take 3 capsules by mouth daily. 30 capsule    diazepam (VALIUM) 2 MG tablet Take 2 mg by mouth 3 (three) times  daily as needed.     levocetirizine (XYZAL ) 5 MG tablet Take 5 mg by mouth every evening.     metoprolol   succinate (TOPROL -XL) 25 MG 24 hr tablet TAKE 1/2 (ONE-HALF) TABLET BY MOUTH ONCE DAILY WITH OR IMMEDIATELY FOLLOWING A MEAL*NEEDS APPOINTMENT FOR FUTRE REFILLS* 45 tablet 0   nitroGLYCERIN  (NITROSTAT ) 0.4 MG SL tablet DISSOLVE ONE TABLET UNDER THE TONGUE EVERY 5 MINUTES AS NEEDED FOR CHEST PAIN.  DO NOT EXCEED A TOTAL OF 3 DOSES IN 15 MINUTES 25 tablet 3   omeprazole  (PRILOSEC) 20 MG capsule Take 20 mg by mouth daily. Takes once every 3 days     polyethylene glycol (MIRALAX / GLYCOLAX) 17 g packet Take 17 g by mouth daily.     temazepam  (RESTORIL ) 30 MG capsule Take 1 capsule (30 mg total) by mouth at bedtime as needed for sleep. 90 capsule 1   Triamcinolone Acetonide (NASACORT ALLERGY 24HR NA) Place 1 spray into the nose daily.     doxycycline (VIBRA-TABS) 100 MG tablet Take 100 mg by mouth 2 (two) times daily. (Patient not taking: Reported on 01/14/2024)     prochlorperazine  (COMPAZINE ) 10 MG tablet TAKE 1 TABLET BY MOUTH EVERY 8 HOURS AS NEEDED FOR NAUSEA FOR VOMITING     traMADol  (ULTRAM ) 50 MG tablet TAKE 1 TABLET BY MOUTH EVERY 6 HOURS AS NEEDED AS DIRECTED FOR PAIN     No current facility-administered medications on file prior to visit.

## 2024-01-16 LAB — TSH: TSH: 1.15 u[IU]/mL (ref 0.450–4.500)

## 2024-01-17 ENCOUNTER — Encounter: Payer: Self-pay | Admitting: Internal Medicine

## 2024-01-17 ENCOUNTER — Ambulatory Visit: Payer: Self-pay | Admitting: Student

## 2024-01-17 NOTE — Assessment & Plan Note (Signed)
Last vitamin D Lab Results  Component Value Date   VD25OH 28.68 (L) 07/29/2022   Low, to start oral replacement

## 2024-01-17 NOTE — Assessment & Plan Note (Signed)
 Pt to f/u ortho, not clear if needs surgical intervention

## 2024-01-17 NOTE — Assessment & Plan Note (Signed)
 Etiology unclear, has echo and monitor ordered with f/u cardiology planned, also for referral Neurology per family request

## 2024-01-17 NOTE — Assessment & Plan Note (Signed)
 Has chronic stable baseline dyspnea mild, cont current inhaler asd

## 2024-01-17 NOTE — Assessment & Plan Note (Signed)
 Has seen ENT - no procedure planned, pt expected to heal

## 2024-01-17 NOTE — Assessment & Plan Note (Signed)
 BP Readings from Last 3 Encounters:  01/14/24 136/78  01/14/24 114/72  12/29/23 123/66   Stable, pt to continue medical treatment amlod 2.5 mg every day, toprol  xl 12.5 mg qd

## 2024-01-17 NOTE — Assessment & Plan Note (Signed)
 Lab Results  Component Value Date   HGBA1C 5.7 (A) 08/05/2023   Stable, pt to continue current medical treatment  - diet,wt control

## 2024-01-19 ENCOUNTER — Telehealth: Payer: Self-pay | Admitting: Student

## 2024-01-19 NOTE — Telephone Encounter (Signed)
 Lvm to sched 68m f/u per 01/14/24 visit w/Callie G. - instructed by Charlena ORN. to sched pt w/any APP due to availability.  JB, 01-19-24

## 2024-01-25 ENCOUNTER — Encounter: Payer: Self-pay | Admitting: *Deleted

## 2024-01-25 ENCOUNTER — Telehealth: Payer: Self-pay
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
## 2024-01-26 NOTE — Telephone Encounter (Signed)
 Called & left a voicemail on daughters phone letting her know the paperwork is completed & was faxed to [facility] on 01/19/24, I also stated she may come by the office to pick up the paperwork as well, as we are unable to email it to her.

## 2024-02-03 ENCOUNTER — Ambulatory Visit: Attending: Student

## 2024-02-03 DIAGNOSIS — E785 Hyperlipidemia, unspecified: Secondary | ICD-10-CM

## 2024-02-03 DIAGNOSIS — I10 Essential (primary) hypertension: Secondary | ICD-10-CM | POA: Diagnosis not present

## 2024-02-03 DIAGNOSIS — I25118 Atherosclerotic heart disease of native coronary artery with other forms of angina pectoris: Secondary | ICD-10-CM | POA: Diagnosis not present

## 2024-02-04 ENCOUNTER — Other Ambulatory Visit (HOSPITAL_COMMUNITY): Admission: RE | Admit: 2024-02-04 | Source: Ambulatory Visit

## 2024-02-04 LAB — ECHOCARDIOGRAM COMPLETE
AR max vel: 2.08 cm2
AV Peak grad: 6.7 mmHg
Ao pk vel: 1.29 m/s
Area-P 1/2: 3.45 cm2
Calc EF: 69 %
S' Lateral: 3.1 cm
Single Plane A2C EF: 69.5 %
Single Plane A4C EF: 65.8 %

## 2024-02-05 ENCOUNTER — Other Ambulatory Visit (HOSPITAL_COMMUNITY)
Admission: RE | Admit: 2024-02-05 | Discharge: 2024-02-05 | Disposition: A | Source: Ambulatory Visit | Attending: Nephrology | Admitting: Nephrology

## 2024-02-05 DIAGNOSIS — E559 Vitamin D deficiency, unspecified: Secondary | ICD-10-CM | POA: Insufficient documentation

## 2024-02-05 DIAGNOSIS — I5032 Chronic diastolic (congestive) heart failure: Secondary | ICD-10-CM | POA: Insufficient documentation

## 2024-02-05 DIAGNOSIS — N189 Chronic kidney disease, unspecified: Secondary | ICD-10-CM | POA: Diagnosis present

## 2024-02-05 DIAGNOSIS — N200 Calculus of kidney: Secondary | ICD-10-CM | POA: Insufficient documentation

## 2024-02-05 DIAGNOSIS — I130 Hypertensive heart and chronic kidney disease with heart failure and stage 1 through stage 4 chronic kidney disease, or unspecified chronic kidney disease: Secondary | ICD-10-CM | POA: Insufficient documentation

## 2024-02-05 DIAGNOSIS — E871 Hypo-osmolality and hyponatremia: Secondary | ICD-10-CM | POA: Diagnosis not present

## 2024-02-05 DIAGNOSIS — E211 Secondary hyperparathyroidism, not elsewhere classified: Secondary | ICD-10-CM | POA: Insufficient documentation

## 2024-02-05 DIAGNOSIS — R7303 Prediabetes: Secondary | ICD-10-CM | POA: Insufficient documentation

## 2024-02-05 LAB — CBC
HCT: 42.8 % (ref 36.0–46.0)
Hemoglobin: 13.9 g/dL (ref 12.0–15.0)
MCH: 31.4 pg (ref 26.0–34.0)
MCHC: 32.5 g/dL (ref 30.0–36.0)
MCV: 96.8 fL (ref 80.0–100.0)
Platelets: 367 K/uL (ref 150–400)
RBC: 4.42 MIL/uL (ref 3.87–5.11)
RDW: 12.7 % (ref 11.5–15.5)
WBC: 4.3 K/uL (ref 4.0–10.5)
nRBC: 0 % (ref 0.0–0.2)

## 2024-02-05 LAB — CORTISOL-AM, BLOOD: Cortisol - AM: 16.3 ug/dL (ref 6.7–22.6)

## 2024-02-05 LAB — RENAL FUNCTION PANEL
Albumin: 4.6 g/dL (ref 3.5–5.0)
Anion gap: 13 (ref 5–15)
BUN: 12 mg/dL (ref 8–23)
CO2: 25 mmol/L (ref 22–32)
Calcium: 9.4 mg/dL (ref 8.9–10.3)
Chloride: 99 mmol/L (ref 98–111)
Creatinine, Ser: 0.61 mg/dL (ref 0.44–1.00)
GFR, Estimated: >60 mL/min
Glucose, Bld: 94 mg/dL (ref 70–99)
Phosphorus: 3.5 mg/dL (ref 2.5–4.6)
Potassium: 4.6 mmol/L (ref 3.5–5.1)
Sodium: 138 mmol/L (ref 135–145)

## 2024-02-05 LAB — VITAMIN D 25 HYDROXY (VIT D DEFICIENCY, FRACTURES): Vit D, 25-Hydroxy: 24.6 ng/mL — ABNORMAL LOW (ref 30–100)

## 2024-02-05 LAB — TSH: TSH: 1.86 u[IU]/mL (ref 0.350–4.500)

## 2024-02-05 LAB — CREATININE, URINE, RANDOM: Creatinine, Urine: 24 mg/dL

## 2024-02-05 LAB — OSMOLALITY: Osmolality: 286 mosm/kg (ref 275–295)

## 2024-02-05 LAB — SODIUM, URINE, RANDOM: Sodium, Ur: 50 mmol/L

## 2024-02-05 LAB — T4, FREE: Free T4: 1.19 ng/dL (ref 0.80–2.00)

## 2024-02-05 LAB — OSMOLALITY, URINE: Osmolality, Ur: 253 mosm/kg — ABNORMAL LOW (ref 300–900)

## 2024-02-06 LAB — MISC LABCORP TEST (SEND OUT): Labcorp test code: 121265

## 2024-02-06 LAB — MICROALBUMIN / CREATININE URINE RATIO
Creatinine, Urine: 22.7 mg/dL
Microalb Creat Ratio: 15 mg/g{creat} (ref 0–29)
Microalb, Ur: 3.3 ug/mL — ABNORMAL HIGH

## 2024-02-06 LAB — PARATHYROID HORMONE, INTACT (NO CA): PTH: 28 pg/mL (ref 15–65)

## 2024-02-14 ENCOUNTER — Ambulatory Visit: Payer: Self-pay | Admitting: Student

## 2024-02-14 DIAGNOSIS — I25118 Atherosclerotic heart disease of native coronary artery with other forms of angina pectoris: Secondary | ICD-10-CM | POA: Diagnosis not present

## 2024-02-14 DIAGNOSIS — I10 Essential (primary) hypertension: Secondary | ICD-10-CM

## 2024-02-14 DIAGNOSIS — E785 Hyperlipidemia, unspecified: Secondary | ICD-10-CM

## 2024-02-14 DIAGNOSIS — R42 Dizziness and giddiness: Secondary | ICD-10-CM | POA: Diagnosis not present

## 2024-02-18 ENCOUNTER — Encounter: Payer: Self-pay | Admitting: Internal Medicine

## 2024-02-18 MED ORDER — TEMAZEPAM 30 MG PO CAPS: 30.0000 mg | ORAL_CAPSULE | Freq: Every evening | ORAL | 1 refills | Status: AC | PRN

## 2024-02-19 ENCOUNTER — Other Ambulatory Visit: Payer: Self-pay | Admitting: Internal Medicine

## 2024-02-25 ENCOUNTER — Telehealth: Payer: Self-pay

## 2024-02-25 ENCOUNTER — Other Ambulatory Visit (HOSPITAL_COMMUNITY): Payer: Self-pay

## 2024-02-25 NOTE — Telephone Encounter (Signed)
 Error

## 2024-03-28 ENCOUNTER — Ambulatory Visit: Admitting: Student

## 2024-03-29 ENCOUNTER — Inpatient Hospital Stay: Admitting: Oncology

## 2024-04-21 ENCOUNTER — Encounter: Admitting: Nurse Practitioner

## 2024-04-27 ENCOUNTER — Inpatient Hospital Stay: Admitting: Oncology

## 2024-09-21 ENCOUNTER — Ambulatory Visit
